# Patient Record
Sex: Female | Born: 1940
Health system: Southern US, Community
[De-identification: ages and names within clinical notes are randomized; demographics above are authoritative.]

## PROBLEM LIST (undated history)

## (undated) DIAGNOSIS — I10 Essential (primary) hypertension: Secondary | ICD-10-CM

## (undated) DIAGNOSIS — H269 Unspecified cataract: Secondary | ICD-10-CM

## (undated) DIAGNOSIS — K449 Diaphragmatic hernia without obstruction or gangrene: Secondary | ICD-10-CM

## (undated) DIAGNOSIS — M810 Age-related osteoporosis without current pathological fracture: Secondary | ICD-10-CM

## (undated) DIAGNOSIS — K589 Irritable bowel syndrome without diarrhea: Secondary | ICD-10-CM

## (undated) DIAGNOSIS — M1710 Unilateral primary osteoarthritis, unspecified knee: Secondary | ICD-10-CM

## (undated) DIAGNOSIS — K219 Gastro-esophageal reflux disease without esophagitis: Secondary | ICD-10-CM

## (undated) DIAGNOSIS — K8020 Calculus of gallbladder without cholecystitis without obstruction: Secondary | ICD-10-CM

## (undated) DIAGNOSIS — M5136 Other intervertebral disc degeneration, lumbar region: Secondary | ICD-10-CM

## (undated) DIAGNOSIS — E785 Hyperlipidemia, unspecified: Secondary | ICD-10-CM

## (undated) DIAGNOSIS — M179 Osteoarthritis of knee, unspecified: Secondary | ICD-10-CM

## (undated) DIAGNOSIS — K5730 Diverticulosis of large intestine without perforation or abscess without bleeding: Secondary | ICD-10-CM

## (undated) DIAGNOSIS — E669 Obesity, unspecified: Secondary | ICD-10-CM

## (undated) DIAGNOSIS — D126 Benign neoplasm of colon, unspecified: Secondary | ICD-10-CM

## (undated) DIAGNOSIS — M51369 Other intervertebral disc degeneration, lumbar region without mention of lumbar back pain or lower extremity pain: Secondary | ICD-10-CM

## (undated) DIAGNOSIS — R000 Tachycardia, unspecified: Secondary | ICD-10-CM

## (undated) DIAGNOSIS — K649 Unspecified hemorrhoids: Secondary | ICD-10-CM

## (undated) DIAGNOSIS — Z8619 Personal history of other infectious and parasitic diseases: Secondary | ICD-10-CM

## (undated) DIAGNOSIS — G43909 Migraine, unspecified, not intractable, without status migrainosus: Secondary | ICD-10-CM

## (undated) HISTORY — DX: Migraine, unspecified, not intractable, without status migrainosus: G43.909

## (undated) HISTORY — DX: Benign neoplasm of colon, unspecified: D12.6

## (undated) HISTORY — DX: Diverticulosis of large intestine without perforation or abscess without bleeding: K57.30

## (undated) HISTORY — DX: Diaphragmatic hernia without obstruction or gangrene: K44.9

## (undated) HISTORY — DX: Calculus of gallbladder without cholecystitis without obstruction: K80.20

## (undated) HISTORY — PX: CATARACT EXTRACTION: SUR2

## (undated) HISTORY — DX: Osteoarthritis of knee, unspecified: M17.9

## (undated) HISTORY — DX: Personal history of other infectious and parasitic diseases: Z86.19

## (undated) HISTORY — DX: Unspecified cataract: H26.9

## (undated) HISTORY — DX: Unspecified hemorrhoids: K64.9

## (undated) HISTORY — DX: Hyperlipidemia, unspecified: E78.5

## (undated) HISTORY — PX: COLONOSCOPY: SHX174

## (undated) HISTORY — DX: Obesity, unspecified: E66.9

## (undated) HISTORY — DX: Essential (primary) hypertension: I10

## (undated) HISTORY — PX: POLYPECTOMY: SHX149

## (undated) HISTORY — DX: Tachycardia, unspecified: R00.0

## (undated) HISTORY — DX: Age-related osteoporosis without current pathological fracture: M81.0

## (undated) HISTORY — DX: Unilateral primary osteoarthritis, unspecified knee: M17.10

## (undated) HISTORY — DX: Irritable bowel syndrome, unspecified: K58.9

## (undated) HISTORY — DX: Other intervertebral disc degeneration, lumbar region: M51.36

## (undated) HISTORY — DX: Other intervertebral disc degeneration, lumbar region without mention of lumbar back pain or lower extremity pain: M51.369

## (undated) HISTORY — DX: Gastro-esophageal reflux disease without esophagitis: K21.9

---

## 1977-07-28 HISTORY — PX: APPENDECTOMY: SHX54

## 1987-07-29 HISTORY — PX: CHOLECYSTECTOMY: SHX55

## 1994-07-28 HISTORY — PX: VAGINAL HYSTERECTOMY: SUR661

## 1999-08-01 ENCOUNTER — Other Ambulatory Visit: Admission: RE | Admit: 1999-08-01 | Discharge: 1999-08-01 | Payer: Self-pay | Admitting: Gynecology

## 2000-03-20 ENCOUNTER — Encounter: Admission: RE | Admit: 2000-03-20 | Discharge: 2000-03-20 | Payer: Self-pay | Admitting: Obstetrics and Gynecology

## 2000-03-20 ENCOUNTER — Encounter: Payer: Self-pay | Admitting: Obstetrics and Gynecology

## 2000-12-17 ENCOUNTER — Other Ambulatory Visit: Admission: RE | Admit: 2000-12-17 | Discharge: 2000-12-17 | Payer: Self-pay | Admitting: Obstetrics and Gynecology

## 2001-03-22 ENCOUNTER — Encounter: Payer: Self-pay | Admitting: Obstetrics and Gynecology

## 2001-03-22 ENCOUNTER — Encounter: Admission: RE | Admit: 2001-03-22 | Discharge: 2001-03-22 | Payer: Self-pay | Admitting: Obstetrics and Gynecology

## 2002-04-26 ENCOUNTER — Encounter: Payer: Self-pay | Admitting: Obstetrics and Gynecology

## 2002-04-26 ENCOUNTER — Encounter: Admission: RE | Admit: 2002-04-26 | Discharge: 2002-04-26 | Payer: Self-pay | Admitting: Obstetrics and Gynecology

## 2002-08-17 ENCOUNTER — Other Ambulatory Visit: Admission: RE | Admit: 2002-08-17 | Discharge: 2002-08-17 | Payer: Self-pay | Admitting: Obstetrics and Gynecology

## 2003-08-02 ENCOUNTER — Encounter: Admission: RE | Admit: 2003-08-02 | Discharge: 2003-08-02 | Payer: Self-pay | Admitting: Obstetrics and Gynecology

## 2003-09-21 ENCOUNTER — Other Ambulatory Visit: Admission: RE | Admit: 2003-09-21 | Discharge: 2003-09-21 | Payer: Self-pay | Admitting: Obstetrics and Gynecology

## 2004-09-12 ENCOUNTER — Ambulatory Visit: Payer: Self-pay | Admitting: Internal Medicine

## 2004-09-16 ENCOUNTER — Ambulatory Visit: Payer: Self-pay | Admitting: Family Medicine

## 2004-11-18 ENCOUNTER — Ambulatory Visit (HOSPITAL_COMMUNITY): Admission: RE | Admit: 2004-11-18 | Discharge: 2004-11-18 | Payer: Self-pay | Admitting: Internal Medicine

## 2004-11-27 ENCOUNTER — Ambulatory Visit: Payer: Self-pay | Admitting: Internal Medicine

## 2004-12-11 ENCOUNTER — Ambulatory Visit: Payer: Self-pay | Admitting: Internal Medicine

## 2005-01-09 ENCOUNTER — Other Ambulatory Visit: Admission: RE | Admit: 2005-01-09 | Discharge: 2005-01-09 | Payer: Self-pay | Admitting: Obstetrics and Gynecology

## 2005-01-29 ENCOUNTER — Ambulatory Visit: Payer: Self-pay | Admitting: Internal Medicine

## 2005-05-15 ENCOUNTER — Ambulatory Visit: Payer: Self-pay | Admitting: Internal Medicine

## 2005-08-07 ENCOUNTER — Ambulatory Visit: Payer: Self-pay | Admitting: Internal Medicine

## 2005-09-29 ENCOUNTER — Ambulatory Visit: Payer: Self-pay | Admitting: Pulmonary Disease

## 2005-11-11 ENCOUNTER — Ambulatory Visit: Payer: Self-pay | Admitting: Gastroenterology

## 2005-11-12 ENCOUNTER — Ambulatory Visit: Payer: Self-pay | Admitting: Internal Medicine

## 2005-11-20 ENCOUNTER — Ambulatory Visit: Payer: Self-pay | Admitting: Gastroenterology

## 2005-12-25 ENCOUNTER — Ambulatory Visit (HOSPITAL_COMMUNITY): Admission: RE | Admit: 2005-12-25 | Discharge: 2005-12-25 | Payer: Self-pay | Admitting: Obstetrics and Gynecology

## 2006-01-09 ENCOUNTER — Encounter: Admission: RE | Admit: 2006-01-09 | Discharge: 2006-01-09 | Payer: Self-pay | Admitting: Obstetrics and Gynecology

## 2006-02-11 ENCOUNTER — Ambulatory Visit: Payer: Self-pay | Admitting: Internal Medicine

## 2006-05-12 ENCOUNTER — Ambulatory Visit: Payer: Self-pay | Admitting: Internal Medicine

## 2006-06-08 ENCOUNTER — Ambulatory Visit: Payer: Self-pay | Admitting: Pulmonary Disease

## 2006-08-10 ENCOUNTER — Ambulatory Visit: Payer: Self-pay | Admitting: Internal Medicine

## 2006-08-10 LAB — CONVERTED CEMR LAB
AST: 19 units/L (ref 0–37)
Bilirubin Urine: NEGATIVE
Chloride: 104 meq/L (ref 96–112)
Eosinophil percent: 1.5 % (ref 0.0–5.0)
Glomerular Filtration Rate, Af Am: 92 mL/min/{1.73_m2}
Glucose, Bld: 107 mg/dL — ABNORMAL HIGH (ref 70–99)
HCT: 45.9 % (ref 36.0–46.0)
Ketones, ur: NEGATIVE mg/dL
Lymphocytes Relative: 29.9 % (ref 12.0–46.0)
Monocytes Relative: 7.2 % (ref 3.0–11.0)
Nitrite: NEGATIVE
Platelets: 280 10*3/uL (ref 150–400)
RBC: 5.24 M/uL — ABNORMAL HIGH (ref 3.87–5.11)
RDW: 12.4 % (ref 11.5–14.6)
Sodium: 143 meq/L (ref 135–145)
Total Protein, Urine: NEGATIVE mg/dL
Urine Glucose: NEGATIVE mg/dL
VLDL: 47 mg/dL — ABNORMAL HIGH (ref 0–40)
WBC: 9.2 10*3/uL (ref 4.5–10.5)
pH: 5.5 (ref 5.0–8.0)

## 2006-08-17 ENCOUNTER — Ambulatory Visit: Payer: Self-pay | Admitting: Critical Care Medicine

## 2006-11-09 ENCOUNTER — Ambulatory Visit: Payer: Self-pay | Admitting: Internal Medicine

## 2006-11-09 LAB — CONVERTED CEMR LAB
ALT: 20 units/L (ref 0–40)
AST: 21 units/L (ref 0–37)
Albumin: 3.8 g/dL (ref 3.5–5.2)
Alkaline Phosphatase: 54 units/L (ref 39–117)
Bilirubin, Direct: 0.2 mg/dL (ref 0.0–0.3)
CO2: 32 meq/L (ref 19–32)
Calcium: 9.5 mg/dL (ref 8.4–10.5)
Chloride: 107 meq/L (ref 96–112)
Cholesterol: 158 mg/dL (ref 0–200)
Eosinophils Absolute: 0.1 10*3/uL (ref 0.0–0.6)
Eosinophils Relative: 2.3 % (ref 0.0–5.0)
GFR calc Af Amer: 128 mL/min
HCT: 37.6 % (ref 36.0–46.0)
HDL: 43.7 mg/dL (ref >39.0)
MCHC: 34.8 g/dL (ref 30.0–36.0)
MCV: 84.2 fL (ref 78.0–100.0)
Monocytes Absolute: 0.4 10*3/uL (ref 0.2–0.7)
Neutro Abs: 3.7 10*3/uL (ref 1.4–7.7)
Neutrophils Relative %: 58.7 % (ref 43.0–77.0)
Potassium: 4.2 meq/L (ref 3.5–5.1)
Total Protein: 6.8 g/dL (ref 6.0–8.3)
VLDL: 31 mg/dL (ref 0–40)
WBC: 6.1 10*3/uL (ref 4.5–10.5)

## 2006-11-16 ENCOUNTER — Ambulatory Visit: Payer: Self-pay | Admitting: Family Medicine

## 2006-12-02 ENCOUNTER — Ambulatory Visit: Payer: Self-pay | Admitting: Gastroenterology

## 2007-02-15 ENCOUNTER — Ambulatory Visit: Payer: Self-pay | Admitting: Internal Medicine

## 2007-02-15 LAB — CONVERTED CEMR LAB
Albumin: 4.2 g/dL (ref 3.5–5.2)
Cholesterol: 179 mg/dL (ref 0–200)
HDL: 48.3 mg/dL (ref >39.0)
LDL Cholesterol: 100 mg/dL — ABNORMAL HIGH (ref 0–99)
Total CHOL/HDL Ratio: 3.7
Triglycerides: 154 mg/dL — ABNORMAL HIGH (ref 0–149)
Vit D, 1,25-Dihydroxy: 30 (ref 20–57)

## 2007-03-02 ENCOUNTER — Ambulatory Visit (HOSPITAL_COMMUNITY): Admission: RE | Admit: 2007-03-02 | Discharge: 2007-03-02 | Payer: Self-pay | Admitting: Obstetrics and Gynecology

## 2007-05-25 ENCOUNTER — Ambulatory Visit: Payer: Self-pay | Admitting: Internal Medicine

## 2007-08-16 DIAGNOSIS — K5730 Diverticulosis of large intestine without perforation or abscess without bleeding: Secondary | ICD-10-CM | POA: Insufficient documentation

## 2007-08-16 DIAGNOSIS — K589 Irritable bowel syndrome without diarrhea: Secondary | ICD-10-CM | POA: Insufficient documentation

## 2007-08-16 DIAGNOSIS — E785 Hyperlipidemia, unspecified: Secondary | ICD-10-CM | POA: Insufficient documentation

## 2007-08-16 DIAGNOSIS — K649 Unspecified hemorrhoids: Secondary | ICD-10-CM

## 2007-08-16 DIAGNOSIS — M810 Age-related osteoporosis without current pathological fracture: Secondary | ICD-10-CM | POA: Insufficient documentation

## 2007-08-16 DIAGNOSIS — I10 Essential (primary) hypertension: Secondary | ICD-10-CM | POA: Insufficient documentation

## 2007-08-16 DIAGNOSIS — K449 Diaphragmatic hernia without obstruction or gangrene: Secondary | ICD-10-CM | POA: Insufficient documentation

## 2007-08-17 ENCOUNTER — Ambulatory Visit: Payer: Self-pay | Admitting: Internal Medicine

## 2007-08-17 DIAGNOSIS — I119 Hypertensive heart disease without heart failure: Secondary | ICD-10-CM | POA: Insufficient documentation

## 2007-08-17 DIAGNOSIS — T887XXA Unspecified adverse effect of drug or medicament, initial encounter: Secondary | ICD-10-CM

## 2007-08-17 DIAGNOSIS — K219 Gastro-esophageal reflux disease without esophagitis: Secondary | ICD-10-CM

## 2007-08-17 LAB — CONVERTED CEMR LAB
ALT: 19 units/L (ref 0–35)
Albumin: 4 g/dL (ref 3.5–5.2)
Alkaline Phosphatase: 52 units/L (ref 39–117)
Basophils Absolute: 0 10*3/uL (ref 0.0–0.1)
Basophils Relative: 0.1 % (ref 0.0–1.0)
CO2: 31 meq/L (ref 19–32)
Chloride: 101 meq/L (ref 96–112)
Cholesterol: 274 mg/dL (ref 0–200)
Creatinine, Ser: 0.7 mg/dL (ref 0.4–1.2)
Eosinophils Absolute: 0.2 10*3/uL (ref 0.0–0.6)
GFR calc non Af Amer: 88 mL/min
Glucose, Bld: 106 mg/dL — ABNORMAL HIGH (ref 70–99)
HDL: 43.8 mg/dL (ref >39.0)
Monocytes Absolute: 0.4 10*3/uL (ref 0.2–0.7)
Nitrite: NEGATIVE
Potassium: 3.9 meq/L (ref 3.5–5.1)
RBC: 4.86 M/uL (ref 3.87–5.11)
Sodium: 141 meq/L (ref 135–145)
Specific Gravity, Urine: 1.025 (ref 1.000–1.03)
TSH: 4.06 microintl units/mL (ref 0.35–5.50)
Total Bilirubin: 1.4 mg/dL — ABNORMAL HIGH (ref 0.3–1.2)
pH: 6 (ref 5.0–8.0)

## 2007-08-19 LAB — CONVERTED CEMR LAB: Vit D, 1,25-Dihydroxy: 28 — ABNORMAL LOW (ref 30–89)

## 2007-12-15 ENCOUNTER — Ambulatory Visit: Payer: Self-pay | Admitting: Internal Medicine

## 2007-12-15 LAB — CONVERTED CEMR LAB
CRP, High Sensitivity: <1 — ABNORMAL LOW (ref 0.00–5.00)
Total CHOL/HDL Ratio: 7
Triglycerides: 204 mg/dL (ref 0–149)
VLDL: 41 mg/dL — ABNORMAL HIGH (ref 0–40)

## 2008-03-15 ENCOUNTER — Ambulatory Visit: Payer: Self-pay | Admitting: Internal Medicine

## 2008-03-15 DIAGNOSIS — B372 Candidiasis of skin and nail: Secondary | ICD-10-CM | POA: Insufficient documentation

## 2008-03-17 LAB — CONVERTED CEMR LAB
HDL: 45.4 mg/dL (ref >39.0)
Total CHOL/HDL Ratio: 4.1
Total Protein: 7.5 g/dL (ref 6.0–8.3)

## 2008-03-23 ENCOUNTER — Ambulatory Visit (HOSPITAL_COMMUNITY): Admission: RE | Admit: 2008-03-23 | Discharge: 2008-03-23 | Payer: Self-pay | Admitting: Obstetrics and Gynecology

## 2008-06-19 ENCOUNTER — Ambulatory Visit: Payer: Self-pay | Admitting: Internal Medicine

## 2008-06-19 DIAGNOSIS — S139XXA Sprain of joints and ligaments of unspecified parts of neck, initial encounter: Secondary | ICD-10-CM | POA: Insufficient documentation

## 2008-09-27 ENCOUNTER — Ambulatory Visit: Payer: Self-pay | Admitting: Internal Medicine

## 2008-09-27 DIAGNOSIS — R079 Chest pain, unspecified: Secondary | ICD-10-CM

## 2008-09-27 LAB — CONVERTED CEMR LAB
ALT: 18 units/L (ref 0–35)
AST: 21 units/L (ref 0–37)
Alkaline Phosphatase: 50 units/L (ref 39–117)
Chloride: 102 meq/L (ref 96–112)
Creatinine, Ser: 0.7 mg/dL (ref 0.4–1.2)
Eosinophils Absolute: 0.2 10*3/uL (ref 0.0–0.7)
GFR calc Af Amer: 107 mL/min
GFR calc non Af Amer: 88 mL/min
Glucose, Bld: 106 mg/dL — ABNORMAL HIGH (ref 70–99)
HDL: 46.1 mg/dL (ref >39.0)
LDL Cholesterol: 106 mg/dL — ABNORMAL HIGH (ref 0–99)
MCHC: 34.7 g/dL (ref 30.0–36.0)
MCV: 85.7 fL (ref 78.0–100.0)
Monocytes Relative: 7.1 % (ref 3.0–12.0)
Neutrophils Relative %: 58.4 % (ref 43.0–77.0)
Total Protein: 7.5 g/dL (ref 6.0–8.3)
WBC: 7.4 10*3/uL (ref 4.5–10.5)

## 2008-11-22 ENCOUNTER — Telehealth (INDEPENDENT_AMBULATORY_CARE_PROVIDER_SITE_OTHER): Payer: Self-pay | Admitting: *Deleted

## 2009-02-01 ENCOUNTER — Encounter: Payer: Self-pay | Admitting: Internal Medicine

## 2009-02-01 ENCOUNTER — Ambulatory Visit: Payer: Self-pay | Admitting: Family Medicine

## 2009-02-06 ENCOUNTER — Ambulatory Visit: Payer: Self-pay | Admitting: Internal Medicine

## 2009-02-06 DIAGNOSIS — M2550 Pain in unspecified joint: Secondary | ICD-10-CM | POA: Insufficient documentation

## 2009-02-06 LAB — CONVERTED CEMR LAB
BUN: 9 mg/dL (ref 6–23)
CO2: 32 meq/L (ref 19–32)
Calcium: 9.5 mg/dL (ref 8.4–10.5)
Chloride: 106 meq/L (ref 96–112)
Cholesterol: 182 mg/dL (ref 0–200)
Creatinine, Ser: 0.7 mg/dL (ref 0.4–1.2)
Glucose, Bld: 109 mg/dL — ABNORMAL HIGH (ref 70–99)
Sodium: 142 meq/L (ref 135–145)
VLDL: 34 mg/dL (ref 0.0–40.0)

## 2009-02-21 ENCOUNTER — Telehealth (INDEPENDENT_AMBULATORY_CARE_PROVIDER_SITE_OTHER): Payer: Self-pay | Admitting: *Deleted

## 2009-03-27 ENCOUNTER — Ambulatory Visit (HOSPITAL_COMMUNITY): Admission: RE | Admit: 2009-03-27 | Discharge: 2009-03-27 | Payer: Self-pay | Admitting: Obstetrics and Gynecology

## 2009-03-30 ENCOUNTER — Ambulatory Visit: Payer: Self-pay | Admitting: Cardiovascular Disease

## 2009-03-30 ENCOUNTER — Emergency Department (HOSPITAL_COMMUNITY): Admission: EM | Admit: 2009-03-30 | Discharge: 2009-03-30 | Payer: Self-pay | Admitting: Emergency Medicine

## 2009-04-09 ENCOUNTER — Ambulatory Visit: Payer: Self-pay | Admitting: Cardiovascular Disease

## 2009-04-09 ENCOUNTER — Ambulatory Visit: Payer: Self-pay

## 2009-04-09 ENCOUNTER — Encounter: Payer: Self-pay | Admitting: Cardiovascular Disease

## 2009-05-14 DIAGNOSIS — E669 Obesity, unspecified: Secondary | ICD-10-CM

## 2009-05-14 DIAGNOSIS — R002 Palpitations: Secondary | ICD-10-CM | POA: Insufficient documentation

## 2009-05-14 DIAGNOSIS — Z86010 Personal history of colonic polyps: Secondary | ICD-10-CM

## 2009-05-14 DIAGNOSIS — M722 Plantar fascial fibromatosis: Secondary | ICD-10-CM

## 2009-05-15 ENCOUNTER — Ambulatory Visit: Payer: Self-pay | Admitting: Cardiovascular Disease

## 2009-05-21 ENCOUNTER — Ambulatory Visit: Payer: Self-pay | Admitting: Internal Medicine

## 2009-05-21 LAB — CONVERTED CEMR LAB
AST: 20 units/L (ref 0–37)
Albumin: 4.1 g/dL (ref 3.5–5.2)
Cholesterol: 194 mg/dL (ref 0–200)
Total Bilirubin: 1.8 mg/dL — ABNORMAL HIGH (ref 0.3–1.2)
Triglycerides: 168 mg/dL — ABNORMAL HIGH (ref 0.0–149.0)

## 2009-10-02 ENCOUNTER — Ambulatory Visit: Payer: Self-pay | Admitting: Internal Medicine

## 2009-10-02 LAB — CONVERTED CEMR LAB
ALT: 18 units/L (ref 0–35)
Albumin: 4.3 g/dL (ref 3.5–5.2)
Alkaline Phosphatase: 47 units/L (ref 39–117)
Basophils Relative: 0.6 % (ref 0.0–3.0)
Bilirubin Urine: NEGATIVE
CRP, High Sensitivity: 0.9 (ref 0.00–5.00)
Calcium: 9.8 mg/dL (ref 8.4–10.5)
Cholesterol: 199 mg/dL (ref 0–200)
Creatinine, Ser: 0.7 mg/dL (ref 0.4–1.2)
Eosinophils Absolute: 0.2 10*3/uL (ref 0.0–0.7)
GFR calc non Af Amer: 87.8 mL/min (ref >60)
HDL: 57.2 mg/dL (ref >39.00)
Hemoglobin: 13.8 g/dL (ref 12.0–15.0)
LDL Cholesterol: 106 mg/dL — ABNORMAL HIGH (ref 0–99)
MCHC: 33.8 g/dL (ref 30.0–36.0)
Nitrite: NEGATIVE
Potassium: 4.2 meq/L (ref 3.5–5.1)
RBC: 4.58 M/uL (ref 3.87–5.11)
Specific Gravity, Urine: 1.025 (ref 1.000–1.030)
Total CHOL/HDL Ratio: 3
Triglycerides: 177 mg/dL — ABNORMAL HIGH (ref 0.0–149.0)
Urobilinogen, UA: 0.2 (ref 0.0–1.0)
VLDL: 35.4 mg/dL (ref 0.0–40.0)

## 2009-10-03 LAB — CONVERTED CEMR LAB: Vit D, 25-Hydroxy: 36 ng/mL (ref 30–89)

## 2009-11-07 ENCOUNTER — Telehealth (INDEPENDENT_AMBULATORY_CARE_PROVIDER_SITE_OTHER): Payer: Self-pay | Admitting: *Deleted

## 2010-01-30 ENCOUNTER — Ambulatory Visit: Payer: Self-pay | Admitting: Internal Medicine

## 2010-02-06 LAB — CONVERTED CEMR LAB
ALT: 17 units/L (ref 0–35)
AST: 20 units/L (ref 0–37)
Bilirubin, Direct: 0.2 mg/dL (ref 0.0–0.3)
Cholesterol: 206 mg/dL — ABNORMAL HIGH (ref 0–200)
Direct LDL: 118.9 mg/dL
Total Bilirubin: 1.6 mg/dL — ABNORMAL HIGH (ref 0.3–1.2)
Total CHOL/HDL Ratio: 4
Triglycerides: 264 mg/dL — ABNORMAL HIGH (ref 0.0–149.0)

## 2010-02-19 ENCOUNTER — Encounter: Payer: Self-pay | Admitting: Adult Health

## 2010-02-19 ENCOUNTER — Ambulatory Visit: Payer: Self-pay | Admitting: Internal Medicine

## 2010-02-19 DIAGNOSIS — R5383 Other fatigue: Secondary | ICD-10-CM

## 2010-02-19 DIAGNOSIS — R5381 Other malaise: Secondary | ICD-10-CM

## 2010-02-20 LAB — CONVERTED CEMR LAB
Bilirubin Urine: NEGATIVE
Chloride: 101 meq/L (ref 96–112)
GFR calc non Af Amer: 82.25 mL/min (ref >60)
Leukocytes, UA: NEGATIVE
Potassium: 5 meq/L (ref 3.5–5.1)
Sodium: 141 meq/L (ref 135–145)
Specific Gravity, Urine: 1.015 (ref 1.000–1.030)
Total Protein, Urine: NEGATIVE mg/dL
Urobilinogen, UA: 0.2 (ref 0.0–1.0)

## 2010-04-05 ENCOUNTER — Ambulatory Visit (HOSPITAL_COMMUNITY): Admission: RE | Admit: 2010-04-05 | Discharge: 2010-04-05 | Payer: Self-pay | Admitting: Obstetrics and Gynecology

## 2010-05-30 ENCOUNTER — Telehealth (INDEPENDENT_AMBULATORY_CARE_PROVIDER_SITE_OTHER): Payer: Self-pay | Admitting: *Deleted

## 2010-05-31 ENCOUNTER — Ambulatory Visit: Payer: Self-pay | Admitting: Internal Medicine

## 2010-06-03 LAB — CONVERTED CEMR LAB
CO2: 32 meq/L (ref 19–32)
Eosinophils Absolute: 0.2 10*3/uL (ref 0.0–0.7)
Glucose, Bld: 99 mg/dL (ref 70–99)
HCT: 40 % (ref 36.0–46.0)
Lymphocytes Relative: 35.4 % (ref 12.0–46.0)
Lymphs Abs: 2.6 10*3/uL (ref 0.7–4.0)
Neutro Abs: 3.9 10*3/uL (ref 1.4–7.7)
Platelets: 219 10*3/uL (ref 150.0–400.0)
Potassium: 4.3 meq/L (ref 3.5–5.1)
RBC: 4.58 M/uL (ref 3.87–5.11)
RDW: 13.1 % (ref 11.5–14.6)

## 2010-06-18 ENCOUNTER — Telehealth: Payer: Self-pay | Admitting: Internal Medicine

## 2010-08-18 ENCOUNTER — Encounter: Payer: Self-pay | Admitting: Obstetrics and Gynecology

## 2010-08-27 NOTE — Progress Notes (Signed)
Summary: BP QUESTION> no change rx indicated  Phone Note Call from Patient Call back at [facility] Phone [phone]   Caller: Patient Call For: [name] Summary of Call: PT WANTS TO DISCUSS HER BP AND CHANGING HER MEDS Initial call taken by: Lacinda Axon,  June 18, 2010 3:44 PM  Follow-up for Phone Call        Spoke with pt.  She states that she has been monitoring her BP and wanted to update MW- lowest reading was 100/60 and highest has been 143/74.  She states that she has had 2 "weak spells" since last seen and her bp was 111/67 and pulse 64 the first time, and the last time it was 130/70- and pulse 70.  She wants to know if we need to adjust her meds.  Pls advise thanks! Follow-up by: Vernie Murders,  June 18, 2010 3:56 PM  Additional Follow-up for Phone Call Additional follow up Details #1::        no indication for changes, can discuss specifics at next ov or send to cards for a second opinion re the relationship between her bp and her spells, whichever she prefers is fine with me Additional Follow-up by: Nyoka Cowden MD,  June 18, 2010 5:53 PM    Additional Follow-up for Phone Call Additional follow up Details #2::    pt prefers to wait until next ov with MW. Carron Curie CMA  June 19, 2010 8:58 AM

## 2010-08-27 NOTE — Progress Notes (Signed)
Summary: rx request  Phone Note Call from Patient Call back at Home Phone [phone]   Caller: Patient Call For: [name] [name] Summary of Call: pt needs new rx for BISOPROLOL [facility]- [facility].  Initial call taken by: Tivis Ringer, CNA,  November 07, 2009 3:50 PM  Follow-up for Phone Call        Spoke with pt and made aware rx was sent to pharm. Follow-up by: Vernie Murders,  November 07, 2009 4:13 PM    Prescriptions: [facility] 5-6.25 MG  TABS (BISOPROLOL-HYDROCHLOROTHIAZIDE) once daily  #90 x 3   Entered by:   Vernie Murders   Authorized by:   Nyoka Cowden MD   Signed by:   Vernie Murders on 11/07/2009   Method used:   Electronically to        [facility] (mail-order)       [address]       [place], [place]  [address]       Ph: [phone]       Fax: [phone]   RxID:   [id number]

## 2010-08-27 NOTE — Assessment & Plan Note (Signed)
Summary: Primary service/ acute  ov for f/u hbp and eval spells   Primary Provider/Referring Provider:  wert  CC:  "Weak spells".  History of Present Illness:  24 yowf never smoker with long-standing hyperlipidemia hypertension and a positive family history of heart disease so ldl target < 130  September 27, 2008 concerned ZO:XWRU she reads in the paper about crestor .  Having occ pressure in chest has had in past but more freq x sev months not necessarily related to activity, maybe better lying down. Lasts sev minutes, epigastric location, no radiation no nausea or diaphoresis or palpitiations or presyncope. No def worsening of chronic positional neck pain on crestor, nor better off it. resolved on maalox.  February 06, 2009 ov for f/u hypelipidemia.  no change in rx  Sept 3 2010 to er for rapid heart rate, eval by Eden Emms, no cp or evidence ihd  May 21, 2009 3 month followup of hbp and cholesterol on rx. no change in rx  October 02, 2009 cpx no tia claudication. no cough or sob.  rec change to simvostatin and consider reclast  January 30, 2010 3 month followup. no cp, sob, tia, leg swelling or aching muscles or sob.   February 19, 2010--Presents for a work in visit. Compalins of sporadic "weak spells" accompanied with nausea x5 episodes within 1 week..   no change in rx  May 31, 2010 ov c/o spells= weak all over  sev x per week lasting a few minutes for up to an hour, sometimes when bends over at waist.  checks her own pulse and appears normal during the spells.  no problem with activity or presyncope, just weak all over.  no amaurosis or viz or localized neuro changes or vertigo. Pt denies any significant sore throat, dysphagia, itching, sneezing,  nasal congestion or excess secretions,  fever, chills, sweats, unintended wt loss, pleuritic or exertional cp, hempoptysis, change in activity tolerance  orthopnea pnd or leg swelling.  Current Medications (verified): 1)  Prilosec Otc 20 Mg  Tbec  (Omeprazole Magnesium) .[name].. 1 Tab By Mouth in The Morning Before Meal 2)  Ecotrin Low Strength 81 Mg  Tbec (Aspirin) .... Once Daily 3)  Centrum Silver  Tabs (Multiple Vitamins-Minerals) .[name].. 1 Once Daily 4)  Ziac 5-6.25 Mg  Tabs (Bisoprolol-Hydrochlorothiazide) .... Once Daily 5)  Citrucel   Powd (Methylcellulose (Laxative)) .... Mix With Large Glass of Juice Once Daily 6)  Simvastatin 20 Mg  Tabs (Simvastatin) .... One Tablet Daily 7)  Oscal 500/200 D-3 500-200 Mg-Unit  Tabs (Calcium-Vitamin D) .... Two Times A Day 8)  Robitussin Dm 100-10 Mg/78ml  Syrp (Dextromethorphan-Guaifenesin) .... As Directed 9)  Tylenol 325 Mg  Tabs (Acetaminophen) .[name].. 1-2 Every 4-6 Hours As Needed 10)  Advil 200 Mg  Caps (Ibuprofen) .[name].. 1-3 With Meals (Three Times A Day) 11)  Claritin 10 Mg  Tabs (Loratadine) .... Once Daily As Needed 12)  Tramadol Hcl 50 Mg  Tabs (Tramadol Hcl) .... Every 4 Hours As Needed  Allergies (verified): No Known Drug Allergies  Past History:  Past Medical History: HYPERTENSION (ICD-401.9) BEN HTN HEART DISEASE WITHOUT HEART FAIL (ICD-402.10) CORONARY HEART DISEASE (ICD-V17.3) PLANTAR FASCIITIS (ICD-728.71) COLONIC POLYPS (ICD-211.3)Colon polyps and Diverticulosis........Marland KitchenRussella Dar      -colonoscopy 11/20/2005 for recall 10/2010 OBESITY (ICD-278.00) PALPITATIONS (ICD-785.1)..................................................................Marland KitchenNishan ARTHRALGIA UNSPECIFIED SITE (ICD-719.40) CHEST PAIN (ICD-786.50) CERVICAL MUSCLE STRAIN (ICD-847.0) CANDIDIASIS, SKIN (ICD-112.3) G E R D (ICD-530.81) UNS ADVRS EFF UNS RX MEDICINAL&BIOLOGICAL SBSTNC (ICD-995.20) DIVERTICULAR DISEASE (  ICD-562.10) HIATAL HERNIA (ICD-553.3) UNSPECIFIED HEMORRHOIDS WITH OTHER COMPLICATION (ICD-455.8) IBS (ICD-564.1) Hyperlipidemia (ICD-272.4) target LDL less than 130 based on positive family history and hypertension Back  Pain..............................................................................................Marland KitchenRamos OSTEOPOROSIS (ICD-733.00)      - CT bone densitometry November 16 2006 : PA Spine -2.6  left fem neck -2.9,   right fem neck -2.3                                        RE-DEXA             02/01/09    -2.5                  -2.8                             -2.5      -  intolerant of biphosphonates G E R D    - see EGD 11/19/06  no gross abn's HEALTH MAINTENANCE.............................................................Marland KitchenWert     - dt  2/06     - Pneumovax 2/06     - CPX October 02, 2009     - GYN [place]  Vital Signs:  Patient profile:   70 year old female Weight:      155.13 pounds O2 Sat:      95 % on Room air Temp:     98.1 degrees F oral Pulse rate:   61 / minute BP sitting:   130 / 80  (left arm)  Vitals Entered By: Vernie Murders (May 31, 2010 11:44 AM)  O2 Flow:  Room air  Physical Exam  Additional Exam:  GENERAL:  A/Ox3; pleasant & cooperative.NAD  No orthostasis noted  WT   162 September 27, 2008 > 161 February 06, 2009 > 158 May 21, 2009 > 157 October 02, 2009 > 155 January 30, 2010 >>155 May 31, 2010  HEENT:  [place]/AT, EOM-wnl, PERRLA, EACs-clear, TMs-wnl, NOSE-clear, THROAT-clear & wnl. NECK:  Supple w/ fair ROM; no JVD; normal carotid impulses w/o bruits; no thyromegaly or nodules palpated; no lymphadenopathy. CHEST:  Clear to P & A; w/o, wheezes/ rales/ or rhonchi. HEART:  RRR, no m/r/g  heard ABDOMEN:  Soft & nt; nml bowel sounds; no organomegaly or masses detected, no guarding or rebound, neg CVA tenderness EXT: Warm bilat,  no calf pain, edema, clubbing, pulses intact    Sodium                    142 mEq/L                   135-145   Potassium                 4.3 mEq/L                   3.5-5.1   Chloride                  103 mEq/L                   96-112   Carbon Dioxide            32 mEq/L                    19-32   Glucose  99 mg/dL                     16-10   BUN                       15 mg/dL                    9-60   Creatinine                0.8 mg/dL                   4.5-4.0   Calcium                   9.7 mg/dL                   9.8-11.9   GFR                       77.34 mL/min                >60  Tests: (2) CBC Platelet w/Diff (CBCD)   White Cell Count          7.2 K/uL                    4.5-10.5   Red Cell Count            4.58 Mil/uL                 3.87-5.11   Hemoglobin                14.0 g/dL                   14.7-82.9   Hematocrit                40.0 %                      36.0-46.0   MCV                       87.4 fl                     78.0-100.0   MCHC                      35.0 g/dL                   56.2-13.0   RDW                       13.1 %                      11.5-14.6   Platelet Count            219.0 K/uL                  150.0-400.0   Neutrophil %              54.5 %                      43.0-77.0   Lymphocyte %              35.4 %  12.0-46.0   Monocyte %                7.6 %                       3.0-12.0   Eosinophils%              2.1 %                       0.0-5.0   Basophils %               0.4 %                       0.0-3.0   Neutrophill Absolute      3.9 K/uL                    1.4-7.7   Lymphocyte Absolute       2.6 K/uL                    0.7-4.0   Monocyte Absolute         0.5 K/uL                    0.1-1.0  Eosinophils, Absolute                             0.2 K/uL                    0.0-0.7   Basophils Absolute        0.0 K/uL                    0.0-0.1  Impression & Recommendations:  Problem # 1:  HYPERTENSION (ICD-401.9)  Her updated medication list for this problem includes:    Ziac 5-6.25 Mg Tabs (Bisoprolol-hydrochlorothiazide) ..... Once daily  No evidence of orthostasis or adverse drug effect here though I'm concerned about it - See instructions for specific recommendations   Orders: Est. Patient Level III ([address])  Problem # 2:  WEAKNESS  (ICD-780.79) Labs ok, no change in recs  Medications Added to Medication List This Visit: 1)  Centrum Silver Tabs (Multiple vitamins-minerals) .[name].. 1 once daily  Other Orders: Influenza Vaccine MCR (60454) TLB-BMP (Basic Metabolic Panel-BMET) (80048-METABOL) TLB-CBC Platelet - w/Differential (85025-CBCD)  Patient Instructions: 1)  See calendar for specific medication instructions and bring it back for each and every office visit for every healthcare provider you see.  Without it,  you may not receive the best quality medical care that we feel you deserve.  2)  Check your blood pressure and pulse rate to see if they if they correlate with your spells 3)  October 03 2010 cpx due, return in meantime  if needed for Holter monitor                  Immunizations Administered:  Influenza Vaccine # 1:    Vaccine Type: Fluvax MCR    Site: right deltoid    Mfr: novartis    Dose: 0.5 ml    Given by: Vernie Murders    Exp. Date: 12/27/2010    Lot #: [id number]    VIS given: 02/18/07 version given May 31, 2010.  Flu Vaccine Consent Questions:    Do you have a history of severe allergic reactions  to this vaccine? no    Any prior history of allergic reactions to egg and/or gelatin? no    Do you have a sensitivity to the preservative Thimersol? no    Do you have a past history of Guillan-Barre Syndrome? no    Do you currently have an acute febrile illness? no    Have you ever had a severe reaction to latex? no    Vaccine information given and explained to patient? yes    Are you currently pregnant? no

## 2010-08-27 NOTE — Progress Notes (Signed)
Summary: fatigue and weakness- ov sched  Phone Note Call from Patient Call back at Home Phone [phone]   Caller: Patient Call For: wert Summary of Call: Pt states she saw her ob/gyn yesterday and her bp was running low 120/70, c/o feeling weak, pls advise.//[facility] Initial call taken by: Darletta Moll,  May 30, 2010 8:49 AM  Follow-up for Phone Call        Spoke with pt.  She is concerned b/c GYN told her that her BP was low at ov there yesterday- 120/70.  I advised that this is actually a good BP reading.  Pt states that she iss also concerned b/c she has been feeling weak and fatigued recently.  OV sched with MW for tommorrow at 11:55 am.  Advised that she bring all meds in hand.  Pt verbalized understanding.   Follow-up by: Vernie Murders,  May 30, 2010 9:36 AM

## 2010-08-27 NOTE — Assessment & Plan Note (Signed)
Summary: Acute NP office visit - weakness   Primary Provider/Referring Provider:  wert  CC:  sporadic "weak spells" accompanied with nausea x5 episodes within 1 week.  History of Present Illness:  58 yowf never smoker with long-standing hyperlipidemia hypertension and a positive family history of heart disease so ldl target < 130  September 27, 2008 concerned ZO:XWRU she reads in the paper about crestor .  Having occ pressure in chest has had in past but more freq x sev months not necessarily related to activity, maybe better lying down. Lasts sev minutes, epigastric location, no radiation no nausea or diaphoresis or palpitiations or presyncope. No def worsening of chronic positional neck pain on crestor, nor better off it. resolved on maalox.  February 06, 2009 ov for f/u hypelipidemia.  no change in rx  Sept 3 2010 to er for rapid heart rate, eval by Eden Emms, no cp or evidence ihd  May 21, 2009 3 month followup of hbp and cholesterol on rx. no change in rx  October 02, 2009 cpx no tia claudication. no cough or sob.  rec change to simvostatin and consider reclast  January 30, 2010 3 month followup. no cp, sob, tia, leg swelling or aching muscles or sob.   February 19, 2010--Presents for a work in visit. Compalins of sporadic "weak spells" accompanied with nausea x5 episodes within 1 week. Has had some intermittent episodes over last week. Has had in past but infrequent. More episodes over last week. Has assoicated nausea and decreased appetitie. Denies vomitting, chest pain, back pain, urinary symptoms, bloody stools, diarrhea, rash, new meds or recent travel. was seen couple weeks ago w/ labs that was essentially unremarkable.    Medications Prior to Update: 1)  Prilosec Otc 20 Mg  Tbec (Omeprazole Magnesium) .[name].. 1 Tab By Mouth in The Morning Before Meal 2)  Ecotrin Low Strength 81 Mg  Tbec (Aspirin) .... Once Daily 3)  Multivitamins   Tabs (Multiple Vitamin) .... Once Daily 4)  Ziac 5-6.25 Mg   Tabs (Bisoprolol-Hydrochlorothiazide) .... Once Daily 5)  Citrucel   Powd (Methylcellulose (Laxative)) .... Mix With Large Glass of Juice Once Daily 6)  Simvastatin 20 Mg  Tabs (Simvastatin) .... One Tablet Daily 7)  Oscal 500/200 D-3 500-200 Mg-Unit  Tabs (Calcium-Vitamin D) .... Two Times A Day 8)  Robitussin Dm 100-10 Mg/29ml  Syrp (Dextromethorphan-Guaifenesin) .... As Directed 9)  Tylenol 325 Mg  Tabs (Acetaminophen) .[name].. 1-2 Every 4-6 Hours As Needed 10)  Advil 200 Mg  Caps (Ibuprofen) .[name].. 1-3 With Meals (Three Times A Day) 11)  Claritin 10 Mg  Tabs (Loratadine) .... Once Daily As Needed 12)  Tramadol Hcl 50 Mg  Tabs (Tramadol Hcl) .... Every 4 Hours As Needed  Current Medications (verified): 1)  Prilosec Otc 20 Mg  Tbec (Omeprazole Magnesium) .[name].. 1 Tab By Mouth in The Morning Before Meal 2)  Ecotrin Low Strength 81 Mg  Tbec (Aspirin) .... Once Daily 3)  Multivitamins   Tabs (Multiple Vitamin) .... Once Daily 4)  Ziac 5-6.25 Mg  Tabs (Bisoprolol-Hydrochlorothiazide) .... Once Daily 5)  Citrucel   Powd (Methylcellulose (Laxative)) .... Mix With Large Glass of Juice Once Daily 6)  Simvastatin 20 Mg  Tabs (Simvastatin) .... One Tablet Daily 7)  Oscal 500/200 D-3 500-200 Mg-Unit  Tabs (Calcium-Vitamin D) .... Two Times A Day 8)  Robitussin Dm 100-10 Mg/24ml  Syrp (Dextromethorphan-Guaifenesin) .... As Directed 9)  Tylenol 325 Mg  Tabs (Acetaminophen) .[name].. 1-2 Every  4-6 Hours As Needed 10)  Advil 200 Mg  Caps (Ibuprofen) .[name].. 1-3 With Meals (Three Times A Day) 11)  Claritin 10 Mg  Tabs (Loratadine) .... Once Daily As Needed 12)  Tramadol Hcl 50 Mg  Tabs (Tramadol Hcl) .... Every 4 Hours As Needed  Allergies (verified): No Known Drug Allergies  Past History:  Past Medical History: Last updated: 01/30/2010 HYPERTENSION (ICD-401.9) BEN HTN HEART DISEASE WITHOUT HEART FAIL (ICD-402.10) CORONARY HEART DISEASE (ICD-V17.3) PLANTAR FASCIITIS (ICD-728.71) COLONIC POLYPS  (ICD-211.3)Colon polyps and Diverticulosis........Marland KitchenRussella Dar      -colonoscopy 11/20/2005 for recall 10/2010 OBESITY (ICD-278.00) PALPITATIONS (ICD-785.1).................................................................Marland KitchenNishan ARTHRALGIA UNSPECIFIED SITE (ICD-719.40) CHEST PAIN (ICD-786.50) CERVICAL MUSCLE STRAIN (ICD-847.0) CANDIDIASIS, SKIN (ICD-112.3) G E R D (ICD-530.81) UNS ADVRS EFF UNS RX MEDICINAL&BIOLOGICAL SBSTNC (ICD-995.20) DIVERTICULAR DISEASE (ICD-562.10) HIATAL HERNIA (ICD-553.3) UNSPECIFIED HEMORRHOIDS WITH OTHER COMPLICATION (ICD-455.8) IBS (ICD-564.1) Hyperlipidemia (ICD-272.4) target LDL less than 130 based on positive family history and hypertension Back Pain..............................................................................................Marland KitchenRamos OSTEOPOROSIS (ICD-733.00)      - CT bone densitometry November 16 2006 : PA Spine -2.6  left fem neck -2.9,   right fem neck -2.3                                        RE-DEXA             02/01/09    -2.5                  -2.8                             -2.5      -  intolerant of biphosphonates G E R D    - see EGD 11/19/06  no gross abn's HEALTH MAINTENANCE.............................................................Marland KitchenWert     - dt  2/06     - Pneumovax 2/06     - CPX October 02, 2009     - GYN [facility]  Past Surgical History: Last updated: 10/02/2009 status post remote vaginal  hysterectomy and cholecystectomy appendectomy.  Family History: Last updated: 10/02/2009 Coronary Heart Disease sister at 7 and brother 21's HOCM in older sister C V A / Stroke: Mother age 27 Colon ca sister no breast ca, dm  Social History: Last updated: 10/02/2009 She lives in [place] with her husband.  She is   retired.  She denies tobacco, alcohol, or drug use, and she does not   routinely exercise but is active outdoors.  Never smoker  Risk Factors: Smoking Status: never (08/17/2007)  Review of Systems      See  HPI  Vital Signs:  Patient profile:   70 year old female Height:      61 inches Weight:      154 pounds BMI:     29.20 O2 Sat:      95 % on Room air Temp:     97.6 degrees F oral Pulse rate:   61 / minute BP sitting:   158 / 94  (left arm) Cuff size:   regular  Vitals Entered By: Boone Master CNA/MA (February 19, 2010 3:11 PM)  O2 Flow:  Room air CC: sporadic "weak spells" accompanied with nausea x5 episodes within 1 week Is Patient Diabetic? No Comments Medications reviewed with patient Daytime contact number verified with patient. Boone Master CNA/MA  February 19, 2010 3:11 PM    Physical  Exam  Additional Exam:  GENERAL:  A/Ox3; pleasant & cooperative.NAD   WT   162 September 27, 2008 > 161 February 06, 2009 > 158 May 21, 2009 > 157 October 02, 2009 > 155 January 30, 2010 >>154 7/26 HEENT:  [place]/AT, EOM-wnl, PERRLA, EACs-clear, TMs-wnl, NOSE-clear, THROAT-clear & wnl. NECK:  Supple w/ fair ROM; no JVD; normal carotid impulses w/o bruits; no thyromegaly or nodules palpated; no lymphadenopathy. CHEST:  Clear to P & A; w/o, wheezes/ rales/ or rhonchi. HEART:  RRR, no m/r/g  heard ABDOMEN:  Soft & nt; nml bowel sounds; no organomegaly or masses detected, no guarding or rebound, neg CVA tenderness EXT: Warm bilat,  no calf pain, edema, clubbing, pulses intact    Impression & Recommendations:  Problem # 1:  WEAKNESS (ICD-780.79) Episodes of weakness w/ nausea ? etilogy.  pt is on several meds that she takes in am on empty stomach this could be causing nausea.  will check labs bmet and urine cx.  Plan:  Change Citrucel to at bedtime  Take Ziac w/ food  Change multivitamin to at bedtime  I will call with lab results.  Increase fluid intake.  Please contact office for sooner follow up if symptoms do not improve or worsen    Orders: T-Urine Culture ([facility]) [id number]) TLB-Udip w/ Micro (81001-URINE) Est. Patient Level IV ([address])  Complete Medication List: 1)  Prilosec Otc  20 Mg Tbec (Omeprazole magnesium) .[name].. 1 tab by mouth in the morning before meal 2)  Ecotrin Low Strength 81 Mg Tbec (Aspirin) .... Once daily 3)  Multivitamins Tabs (Multiple vitamin) .... Once daily 4)  Ziac 5-6.25 Mg Tabs (Bisoprolol-hydrochlorothiazide) .... Once daily 5)  Citrucel Powd (Methylcellulose (laxative)) .... Mix with large glass of juice once daily 6)  Simvastatin 20 Mg Tabs (Simvastatin) .... One tablet daily 7)  Oscal 500/200 D-3 500-200 Mg-unit Tabs (Calcium-vitamin d) .... Two times a day 8)  Robitussin Dm 100-10 Mg/61ml Syrp (Dextromethorphan-guaifenesin) .... As directed 9)  Tylenol 325 Mg Tabs (Acetaminophen) .[name].. 1-2 every 4-6 hours as needed 10)  Advil 200 Mg Caps (Ibuprofen) .[name].. 1-3 with meals (three times a day) 11)  Claritin 10 Mg Tabs (Loratadine) .... Once daily as needed 12)  Tramadol Hcl 50 Mg Tabs (Tramadol hcl) .... Every 4 hours as needed  Other Orders: TLB-BMP (Basic Metabolic Panel-BMET) (80048-METABOL)  Patient Instructions: 1)  Change Citrucel to at bedtime  2)  Take Ziac w/ food  3)  Change multivitamin to at bedtime  4)  I will call with lab results.  5)  Increase fluid intake.  6)  Please contact office for sooner follow up if symptoms do not improve or worsen

## 2010-08-27 NOTE — Assessment & Plan Note (Signed)
Summary: Primary svc/ comprehensive eval   Primary Provider/Referring Provider:  Navin Dogan  CC:  cpx fasting.  History of Present Illness:  33  with long-standing hyperlipidemia hypertension and a positive family history of heart disease so ldl target < 130  September 27, 2008 concerned ZO:XWRU she reads in the paper about crestor .  Having occ pressure in chest has had in past but more freq x sev months not necessarily related to activity, maybe better lying down. Lasts sev minutes, epigastric location, no radiation no nausea or diaphoresis or palpitiations or presyncope. No def worsening of chronic positional neck pain on crestor, nor better off it. resolved on maalox.  February 06, 2009 ov for f/u hypelipidemia.  no change in rx  Sept 3 2010 to er for rapid heart rate, eval by [facility], no cp or evidence ihd  May 21, 2009 3 month followup of hbp and cholesterol on rx. no change in rx  October 02, 2009 cpx no tia claudication. no cough or sob.  Pt denies any significant sore throat, dysphagia, itching, sneezing,  nasal congestion or excess secretions,  fever, chills, sweats, unintended wt loss, pleuritic or exertional cp, hempoptysis, change in activity tolerance  orthopnea pnd or leg swelling.   Current Medications (verified): 1)  Prilosec Otc 20 Mg  Tbec (Omeprazole Magnesium) .[name].. 1 Tab By Mouth in The Morning Before Meal 2)  Ecotrin Low Strength 81 Mg  Tbec (Aspirin) .... Once Daily 3)  Multivitamins   Tabs (Multiple Vitamin) .... Once Daily 4)  Ziac 5-6.25 Mg  Tabs (Bisoprolol-Hydrochlorothiazide) .... Once Daily 5)  Citrucel   Powd (Methylcellulose (Laxative)) .... Mix With Large Glass of Juice Once Daily 6)  Crestor 10 Mg  Tabs (Rosuvastatin Calcium) .... Take 1/2 Tab By Mouth At Bedtime 7)  Oscal 500/200 D-3 500-200 Mg-Unit  Tabs (Calcium-Vitamin D) .... Two Times A Day 8)  Robitussin Dm 100-10 Mg/69ml  Syrp (Dextromethorphan-Guaifenesin) .... As Directed 9)  Tylenol 325 Mg  Tabs  (Acetaminophen) .[name].. 1-2 Every 4-6 Hours As Needed 10)  Advil 200 Mg  Caps (Ibuprofen) .[name].. 1-3 With Meals (Three Times A Day) 11)  Claritin 10 Mg  Tabs (Loratadine) .... Once Daily As Needed 12)  Tramadol Hcl 50 Mg  Tabs (Tramadol Hcl) .... Every 4 Hours As Needed  Allergies (verified): No Known Drug Allergies  Past History:  Past Medical History: HYPERTENSION (ICD-401.9) BEN HTN HEART DISEASE WITHOUT HEART FAIL (ICD-402.10) CORONARY HEART DISEASE (ICD-V17.3) PLANTAR FASCIITIS (ICD-728.71) COLONIC POLYPS (ICD-211.3)Colon polyps and Diverticulosis........Marland KitchenRussella Dar      -colonoscopy 11/20/2005 for recall 10/2010 OBESITY (ICD-278.00) PALPITATIONS (ICD-785.1).................................................................Marland KitchenNishan ARTHRALGIA UNSPECIFIED SITE (ICD-719.40) CHEST PAIN (ICD-786.50) CERVICAL MUSCLE STRAIN (ICD-847.0) CANDIDIASIS, SKIN (ICD-112.3) G E R D (ICD-530.81) UNS ADVRS EFF UNS RX MEDICINAL&BIOLOGICAL SBSTNC (ICD-995.20) DIVERTICULAR DISEASE (ICD-562.10) HIATAL HERNIA (ICD-553.3) UNSPECIFIED HEMORRHOIDS WITH OTHER COMPLICATION (ICD-455.8) IBS (ICD-564.1) Hyperlipidemia (ICD-272.4) target LDL less than 130 based on positive family history and hypertension Back Pain..............................................................................................Marland KitchenRamos OSTEOPOROSIS (ICD-733.00)      - CT bone densitometry November 16 2006 : PA Spine -2.6  left fem neck -2.9,   right fem neck -2.3                                        RE-DEXA             02/01/09    -2.5                  -  2.8                             -2.5      -  intolerant of biphosphonates G E R D    - see EGD 11/19/06  no gross abn's HEALTH MAINTENANCE     - dt  2/06     - Pneumovax 2/06     - CPX October 02, 2009     - GYN [place]  Past Surgical History: status post remote vaginal  hysterectomy and cholecystectomy appendectomy.  Family History: Coronary Heart Disease sister at 70 and brother  65's HOCM in older sister C V A / Stroke: Mother age 67 Colon ca sister no breast ca, dm  Social History: She lives in [place] with her husband.  She is   retired.  She denies tobacco, alcohol, or drug use, and she does not   routinely exercise but is active outdoors.  Never smoker  Vital Signs:  Patient profile:   70 year old female Height:      61 inches Weight:      157 pounds O2 Sat:      98 % on Room air Temp:     97.7 degrees F oral Pulse rate:   62 / minute BP sitting:   120 / 78  (left arm)  Vitals Entered By: Vernie Murders (October 02, 2009 8:46 AM)  O2 Flow:  Room air  Physical Exam  Additional Exam:  GENERAL:  A/Ox3; pleasant & cooperative.NAD   WT   162 September 27, 2008 > 161 February 06, 2009 > 158 May 21, 2009 > 157 October 02, 2009  HEENT:  [place]/AT, EOM-wnl, PERRLA, EACs-clear, TMs-wnl, NOSE-clear, THROAT-clear & wnl. NECK:  Supple w/ fair ROM; no JVD; normal carotid impulses w/o bruits; no thyromegaly or nodules palpated; no lymphadenopathy. CHEST:  Clear to P & A; w/o, wheezes/ rales/ or rhonchi. HEART:  RRR, no m/r/g  heard ABDOMEN:  Soft & nt; nml bowel sounds; no organomegaly or masses detected. EXT: Warm bilat,  no calf pain, edema, clubbing, pulses intact    Cholesterol               199 mg/dL                   1-610     ATP III Classification            Desirable:  < 200 mg/dL                    Borderline High:  200 - 239 mg/dL               High:  > = 240 mg/dL   Triglycerides        [H]  177.0 mg/dL                 9.6-045.4     Normal:  <150 mg/dL     Borderline High:  098 - 199 mg/dL   HDL                       11.91 mg/dL                 >47.82   VLDL Cholesterol          35.4 mg/dL  0.0-40.0   LDL Cholesterol      [H]  161 mg/dL                   0-96  CHO/HDL Ratio:  CHD Risk                             3                    Men          Women     1/2 Average Risk     3.4          3.3     Average Risk          5.0           4.4     2X Average Risk          9.6          7.1     3X Average Risk          15.0          11.0                           Tests: (2) BMP (METABOL)   Sodium                    143 mEq/L                   135-145   Potassium                 4.2 mEq/L                   3.5-5.1   Chloride                  106 mEq/L                   96-112   Carbon Dioxide       [H]  33 mEq/L                    19-32   Glucose              [H]  106 mg/dL                   04-54   BUN                       12 mg/dL                    0-98   Creatinine                0.7 mg/dL                   1.1-9.1   Calcium                   9.8 mg/dL                   4.7-82.9   GFR                       87.80 mL/min                >60  Tests: (3) CBC Platelet w/Diff (CBCD)   White Cell Count          6.4 K/uL                    4.5-10.5   Red Cell Count            4.58 Mil/uL                 3.87-5.11   Hemoglobin                13.8 g/dL                   40.3-47.4   Hematocrit                40.7 %                      36.0-46.0   MCV                       88.8 fl                     78.0-100.0   MCHC                      33.8 g/dL                   25.9-56.3   RDW                       12.4 %                      11.5-14.6   Platelet Count            225.0 K/uL                  150.0-400.0   Neutrophil %              59.3 %                      43.0-77.0   Lymphocyte %              29.9 %                      12.0-46.0   Monocyte %                6.7 %                       3.0-12.0   Eosinophils%              3.5 %                       0.0-5.0   Basophils %               0.6 %                       0.0-3.0   Neutrophill Absolute      3.9 K/uL                    1.4-7.7   Lymphocyte Absolute       1.9 K/uL  0.7-4.0   Monocyte Absolute         0.4 K/uL                    0.1-1.0  Eosinophils, Absolute                             0.2 K/uL                    0.0-0.7   Basophils Absolute         0.0 K/uL                    0.0-0.1  Tests: (4) Hepatic/Liver Function Panel (HEPATIC)   Total Bilirubin      [H]  1.4 mg/dL                   1.6-1.0   Direct Bilirubin          0.2 mg/dL                   9.6-0.4   Alkaline Phosphatase      47 U/L                      39-117   AST                       22 U/L                      0-37   ALT                       18 U/L                      0-35   Total Protein             7.8 g/dL                    5.4-0.9   Albumin                   4.3 g/dL                    8.1-1.9  Tests: (5) TSH (TSH)   FastTSH                   4.13 uIU/mL                 0.35-5.50  Tests: (6) Full Range CRP (FCRP)   Full Range CRP            0.90 mg/L                   0.00-5.00     Note:  An elevated hs-CRP (>5 mg/L) should be repeated after 2 weeks to rule out recent infection or trauma.  Tests: (7) UDip w/Micro (URINE)   Color                     LT. YELLOW       RANGE:  Yellow;Lt. Yellow   Clarity                   CLEAR  Clear   Specific Gravity          1.025                       1.000 - 1.030   Urine Ph                  6.0                         5.0-8.0   Protein                   NEGATIVE                    Negative   Urine Glucose             NEGATIVE                    Negative   Ketones                   NEGATIVE                    Negative   Urine Bilirubin           NEGATIVE                    Negative   Blood                     TRACE-INTACT                Negative   Urobilinogen              0.2                         0.0 - 1.0   Leukocyte Esterace        TRACE                       Negative   Nitrite                   NEGATIVE                    Negative   Urine WBC                 0-2/hpf                     0-2/hpf   Urine RBC                 0-2/hpf                     0-2/hpf   Urine Epith               Rare(0-4/hpf)               Rare(0-4/hpf)  EKG  Procedure date:  10/02/2009  Findings:      nsr,  wnl  CXR  Procedure date:  10/02/2009  Findings:      Comparison: 03/30/2009   Findings: Cardiomegaly is identified. The lungs are clear. There is no evidence of focal airspace disease, pulmonary edema, pleural effusion, or pneumothorax. No acute bony abnormalities are identified.   IMPRESSION: Cardiomegaly without evidence of active cardiopulmonary disease.  Impression & Recommendations:  Problem # 1:  HYPERTENSION (ICD-401.9) ok on rx Her updated medication list for this problem includes:    Ziac 5-6.25 Mg Tabs (Bisoprolol-hydrochlorothiazide) ..... Once daily  Orders: EKG w/ Interpretation (93000) TLB-Udip ONLY (81003-UDIP) T-2 View CXR (71020TC)  Problem # 2:  HYPERLIPIDEMIA (ICD-272.4)  The following medications were removed from the medication list:    Crestor 10 Mg Tabs (Rosuvastatin calcium) .[name]... Take 1/2 tab by mouth at bedtime Her updated medication list for this problem includes:    Simvastatin 20 Mg Tabs (Simvastatin) ..... One tablet daily with target < 130  Labs Reviewed: SGOT: 20 (05/21/2009)   SGPT: 18 (05/21/2009)   HDL:48.90 (05/21/2009), 47.30 (02/06/2009)  LDL:112 (05/21/2009 )  LDL  106 October 02, 2009   161 (02/06/2009)  Chol:194 (05/21/2009), 182 (02/06/2009)  Trig:168.0 (05/21/2009), 170.0 (02/06/2009)  Problem # 3:  OSTEOPOROSIS (ICD-733.00) check vit d consider reclast if not already eval for it since has GERD and intol of biphosphonates  Problem # 4:  G E R D (ICD-530.81)  Her updated medication list for this problem includes:    Prilosec Otc 20 Mg Tbec (Omeprazole magnesium) .[name]... 1 tab by mouth in the morning before meal  Medications Added to Medication List This Visit: 1)  Simvastatin 20 Mg Tabs (Simvastatin) .... One tablet daily  Other Orders: T-Vitamin D (25-Hydroxy) [phone]) TLB-Lipid Panel (80061-LIPID) TLB-BMP (Basic Metabolic Panel-BMET) (80048-METABOL) TLB-CBC Platelet - w/Differential  (85025-CBCD) TLB-Hepatic/Liver Function Pnl (80076-HEPATIC) TLB-TSH (Thyroid Stimulating Hormone) (84443-TSH) TLB-CRP-High Sensitivity (C-Reactive Protein) (86140-FCRP) Est. Patient 65& > (11914)  Patient Instructions: 1)  Call [phone] for your results w/in next 3 days - if there's something important  I feel you need to know,  I'll be in touch with you directly.  2)  Return to office in 3 months, sooner if needed fasting for recheck cholesterol on new medication = simvastatin in place of crestor  3)  ADD:  CONSIDER RECLAST EVAL NEXT OV Prescriptions: SIMVASTATIN 20 MG  TABS (SIMVASTATIN) One tablet daily  #90 x 3   Entered and Authorized by:   Nyoka Cowden MD   Signed by:   Nyoka Cowden MD on 10/02/2009   Method used:   Print then Give to Patient   RxID:   [id number]    Cardio[employer] ECG  ID: [id number] Patient: [name] [name] DOB: 02/08/1941 Age: 70 Years Old Sex: Female Race: White Height: 61 Weight: 157 Status: Unconfirmed Past Medical History:  Palpitations................................................................................Marland KitchenNishan HYPERTENSION (ICD-401.9) BEN HTN HEART DISEASE WITHOUT HEART FAIL (ICD-402.10) CORONARY HEART DISEASE (ICD-V17.3) PLANTAR FASCIITIS (ICD-728.71) COLONIC POLYPS (ICD-211.3)Colon polyps and Diverticulosis      -colonoscopy 11/20/2005 for recall 10/2010 OBESITY (ICD-278.00) PALPITATIONS (ICD-785.1) ARTHRALGIA UNSPECIFIED SITE (ICD-719.40) CHEST PAIN (ICD-786.50) CERVICAL MUSCLE STRAIN (ICD-847.0) CANDIDIASIS, SKIN (ICD-112.3) G E R D (ICD-530.81) UNS ADVRS EFF UNS RX MEDICINAL&BIOLOGICAL SBSTNC (ICD-995.20) DIVERTICULAR DISEASE (ICD-562.10) HIATAL HERNIA (ICD-553.3) UNSPECIFIED HEMORRHOIDS WITH OTHER COMPLICATION (ICD-455.8) IBS (ICD-564.1) Hyperlipidemia (ICD-272.4) target LDL less than 130 based on positive family history and hypertension Back  Pain..............................................................................................Marland KitchenRamos OSTEOPOROSIS (ICD-733.00)      - CT bone densitometry November 16 2006 : [place] Spine -2.6  left fem neck -2.9,   right fem neck -2.3                                        RE-DEXA             02/01/09    -  2.5                  -2.8                             -2.5      -  intolerant of biphosphonate G E R D    - see EGD 11/19/06  no gross abn's HEALTH MAINTENANCE     - dt  2/06     - Pneumovax 2/06     - CPX September 27, 2008   Recorded: 10/02/2009 08:56 AM P/PR: 123 ms / 240 ms - Heart rate (maximum exercise) QRS: 93 QT/QTc/QTd: 405 ms / 407 ms / 39 ms - Heart rate (maximum exercise)  P/QRS/T axis: 49 deg / 50 deg / 56 deg - Heart rate (maximum exercise)  Heartrate: 61 bpm  Interpretation:  nsr, wnl   EKG  Procedure date:  10/02/2009  Findings:      nsr, wnl  CXR  Procedure date:  10/02/2009  Findings:      Comparison: 03/30/2009   Findings: Cardiomegaly is identified. The lungs are clear. There is no evidence of focal airspace disease, pulmonary edema, pleural effusion, or pneumothorax. No acute bony abnormalities are identified.   IMPRESSION: Cardiomegaly without evidence of active cardiopulmonary disease.

## 2010-08-27 NOTE — Assessment & Plan Note (Signed)
Summary: Primary svc/ f/u hbp, chol   Primary Provider/Referring Provider:  Jakki Doughty  CC:  3 month followup.  Pt denies any complaints today.[name]  History of Present Illness:  15 yowf never smoker with long-standing hyperlipidemia hypertension and a positive family history of heart disease so ldl target < 130  September 27, 2008 concerned EX:BMWU she reads in the paper about crestor .  Having occ pressure in chest has had in past but more freq x sev months not necessarily related to activity, maybe better lying down. Lasts sev minutes, epigastric location, no radiation no nausea or diaphoresis or palpitiations or presyncope. No def worsening of chronic positional neck pain on crestor, nor better off it. resolved on maalox.  February 06, 2009 ov for f/u hypelipidemia.  no change in rx  Sept 3 2010 to er for rapid heart rate, eval by [facility], no cp or evidence ihd  May 21, 2009 3 month followup of hbp and cholesterol on rx. no change in rx  October 02, 2009 cpx no tia claudication. no cough or sob.  rec change to simvostatin and consider reclast  January 30, 2010 3 month followup. no cp, sob, tia, leg swelling or aching muscles or sob. Pt denies any significant sore throat, dysphagia, itching, sneezing,  nasal congestion or excess secretions,  fever, chills, sweats, unintended wt loss, pleuritic or exertional cp, hempoptysis, change in activity tolerance  orthopnea pnd or leg swelling   Current Medications (verified): 1)  Prilosec Otc 20 Mg  Tbec (Omeprazole Magnesium) .[name].. 1 Tab By Mouth in The Morning Before Meal 2)  Ecotrin Low Strength 81 Mg  Tbec (Aspirin) .... Once Daily 3)  Multivitamins   Tabs (Multiple Vitamin) .... Once Daily 4)  Ziac 5-6.25 Mg  Tabs (Bisoprolol-Hydrochlorothiazide) .... Once Daily 5)  Citrucel   Powd (Methylcellulose (Laxative)) .... Mix With Large Glass of Juice Once Daily 6)  Simvastatin 20 Mg  Tabs (Simvastatin) .... One Tablet Daily 7)  Oscal 500/200 D-3 500-200  Mg-Unit  Tabs (Calcium-Vitamin D) .... Two Times A Day 8)  Robitussin Dm 100-10 Mg/103ml  Syrp (Dextromethorphan-Guaifenesin) .... As Directed 9)  Tylenol 325 Mg  Tabs (Acetaminophen) .[name].. 1-2 Every 4-6 Hours As Needed 10)  Advil 200 Mg  Caps (Ibuprofen) .[name].. 1-3 With Meals (Three Times A Day) 11)  Claritin 10 Mg  Tabs (Loratadine) .... Once Daily As Needed 12)  Tramadol Hcl 50 Mg  Tabs (Tramadol Hcl) .... Every 4 Hours As Needed  Allergies (verified): No Known Drug Allergies  Past History:  Past Medical History: HYPERTENSION (ICD-401.9) BEN HTN HEART DISEASE WITHOUT HEART FAIL (ICD-402.10) CORONARY HEART DISEASE (ICD-V17.3) PLANTAR FASCIITIS (ICD-728.71) COLONIC POLYPS (ICD-211.3)Colon polyps and Diverticulosis........Marland KitchenRussella Dar      -colonoscopy 11/20/2005 for recall 10/2010 OBESITY (ICD-278.00) PALPITATIONS (ICD-785.1).................................................................Marland KitchenNishan ARTHRALGIA UNSPECIFIED SITE (ICD-719.40) CHEST PAIN (ICD-786.50) CERVICAL MUSCLE STRAIN (ICD-847.0) CANDIDIASIS, SKIN (ICD-112.3) G E R D (ICD-530.81) UNS ADVRS EFF UNS RX MEDICINAL&BIOLOGICAL SBSTNC (ICD-995.20) DIVERTICULAR DISEASE (ICD-562.10) HIATAL HERNIA (ICD-553.3) UNSPECIFIED HEMORRHOIDS WITH OTHER COMPLICATION (ICD-455.8) IBS (ICD-564.1) Hyperlipidemia (ICD-272.4) target LDL less than 130 based on positive family history and hypertension Back Pain..............................................................................................Marland KitchenRamos OSTEOPOROSIS (ICD-733.00)      - CT bone densitometry November 16 2006 : PA Spine -2.6  left fem neck -2.9,   right fem neck -2.3  RE-DEXA             02/01/09    -2.5                  -2.8                             -2.5      -  intolerant of biphosphonates G E R D    - see EGD 11/19/06  no gross abn's HEALTH MAINTENANCE.............................................................Marland KitchenWert     - dt  2/06     -  Pneumovax 2/06     - CPX October 02, 2009     - GYN [place]  Vital Signs:  Patient profile:   70 year old female Weight:      155 pounds O2 Sat:      96 % on Room air Temp:     98.3 degrees F oral Pulse rate:   62 / minute BP sitting:   148 / 94  (left arm)  Vitals Entered By: Vernie Murders (January 30, 2010 9:21 AM)  O2 Flow:  Room air  Physical Exam  Additional Exam:  GENERAL:  A/Ox3; pleasant & cooperative.NAD   WT   162 September 27, 2008 > 161 February 06, 2009 > 158 May 21, 2009 > 157 October 02, 2009 > 155 January 30, 2010  HEENT:  [place]/AT, EOM-wnl, PERRLA, EACs-clear, TMs-wnl, NOSE-clear, THROAT-clear & wnl. NECK:  Supple w/ fair ROM; no JVD; normal carotid impulses w/o bruits; no thyromegaly or nodules palpated; no lymphadenopathy. CHEST:  Clear to P & A; w/o, wheezes/ rales/ or rhonchi. HEART:  RRR, no m/r/g  heard ABDOMEN:  Soft & nt; nml bowel sounds; no organomegaly or masses detected. EXT: Warm bilat,  no calf pain, edema, clubbing, pulses intact    Cholesterol          [H]  206 mg/dL                   1-610     ATP III Classification            Desirable:  < 200 mg/dL                    Borderline High:  200 - 239 mg/dL               High:  > = 240 mg/dL   Triglycerides        [H]  264.0 mg/dL                 9.6-045.4     Normal:  <150 mg/dL     Borderline High:  098 - 199 mg/dL   HDL                       11.91 mg/dL                 >47.82   VLDL Cholesterol     [H]  52.8 mg/dL                  9.5-62.1  CHO/HDL Ratio:  CHD Risk                             4  Men          Women     1/2 Average Risk     3.4          3.3     Average Risk          5.0          4.4     2X Average Risk          9.6          7.1     3X Average Risk          15.0          11.0                           Tests: (2) Hepatic/Liver Function Panel (HEPATIC)   Total Bilirubin      [H]  1.6 mg/dL                   1.6-1.0   Direct Bilirubin          0.2 mg/dL                    9.6-0.4   Alkaline Phosphatase      41 U/L                      39-117   AST                       20 U/L                      0-37   ALT                       17 U/L                      0-35   Total Protein             7.4 g/dL                    5.4-0.9   Albumin                   4.3 g/dL                    8.1-1.9  Tests: (3) Cholesterol LDL - Direct (DIRLDL)  Cholesterol LDL - Direct                             118.9 mg/dL  Impression & Recommendations:  Problem # 1:  HYPERLIPIDEMIA (ICD-272.4) Target < 130 due to HBP Her updated medication list for this problem includes:    Simvastatin 20 Mg Tabs (Simvastatin) ..... One tablet daily  Labs Reviewed: SGOT: 22 (10/02/2009)   SGPT: 18 (10/02/2009)   HDL:57.20 (10/02/2009), 48.90 (05/21/2009)  LDL:106 (10/02/2009), 112 (05/21/2009) > 128 January 30, 2010 so no change   Chol:199 (10/02/2009), 194 (05/21/2009)  Trig:177.0 (10/02/2009), 168.0 (05/21/2009)  Problem # 2:  HYPERTENSION (ICD-401.9)  Her updated medication list for this problem includes:    Ziac 5-6.25 Mg Tabs (Bisoprolol-hydrochlorothiazide) ..... Once daily  not ideal, work on diet  Each maintenance medication was reviewed in detail including most importantly the difference between maintenance prns and under what circumstances the  prns are to be used.  In addition, these two groups (for which the patient should keep up with refills) were distinguished from a third group :  meds that are used only short term with the intent to complete a course of therapy and then not refill them.  The med list was then fully reconciled and reorganized to reflect this important distinction.   Orders: Est. Patient Level III (04540)  Other Orders: TLB-Lipid Panel (80061-LIPID) TLB-Hepatic/Liver Function Pnl (80076-HEPATIC)  Patient Instructions: 1)  Return to office in 3 months, sooner if needed to see Tammy to recheck BP and discuss Reclast 2)  Avoid salt or salty foods

## 2010-10-09 ENCOUNTER — Encounter: Payer: Self-pay | Admitting: Internal Medicine

## 2010-10-29 ENCOUNTER — Other Ambulatory Visit: Payer: Self-pay | Admitting: Internal Medicine

## 2010-10-29 ENCOUNTER — Encounter: Payer: Self-pay | Admitting: Internal Medicine

## 2010-10-29 ENCOUNTER — Ambulatory Visit (INDEPENDENT_AMBULATORY_CARE_PROVIDER_SITE_OTHER): Payer: Medicare Other | Admitting: Internal Medicine

## 2010-10-29 ENCOUNTER — Ambulatory Visit (INDEPENDENT_AMBULATORY_CARE_PROVIDER_SITE_OTHER)
Admission: RE | Admit: 2010-10-29 | Discharge: 2010-10-29 | Disposition: A | Discharge status: Home / Self Care | Payer: Medicare Other | Source: Ambulatory Visit | Attending: Internal Medicine | Admitting: Internal Medicine

## 2010-10-29 ENCOUNTER — Other Ambulatory Visit (INDEPENDENT_AMBULATORY_CARE_PROVIDER_SITE_OTHER): Payer: Medicare Other

## 2010-10-29 ENCOUNTER — Other Ambulatory Visit (INDEPENDENT_AMBULATORY_CARE_PROVIDER_SITE_OTHER): Payer: Medicare Other | Admitting: Internal Medicine

## 2010-10-29 VITALS — BP 132/82 | HR 67 | Temp 98.0°F | Ht 60.75 in | Wt 155.2 lb

## 2010-10-29 DIAGNOSIS — M810 Age-related osteoporosis without current pathological fracture: Secondary | ICD-10-CM

## 2010-10-29 DIAGNOSIS — I10 Essential (primary) hypertension: Secondary | ICD-10-CM

## 2010-10-29 DIAGNOSIS — Z0000 Encounter for general adult medical examination without abnormal findings: Secondary | ICD-10-CM

## 2010-10-29 DIAGNOSIS — E785 Hyperlipidemia, unspecified: Secondary | ICD-10-CM

## 2010-10-29 DIAGNOSIS — I119 Hypertensive heart disease without heart failure: Secondary | ICD-10-CM

## 2010-10-29 DIAGNOSIS — M2550 Pain in unspecified joint: Secondary | ICD-10-CM

## 2010-10-29 DIAGNOSIS — M1990 Unspecified osteoarthritis, unspecified site: Secondary | ICD-10-CM

## 2010-10-29 LAB — CBC WITH DIFFERENTIAL/PLATELET
Basophils Absolute: 0 10*3/uL (ref 0.0–0.1)
Basophils Relative: 0.4 % (ref 0.0–3.0)
Eosinophils Absolute: 0.2 10*3/uL (ref 0.0–0.7)
HCT: 41.5 % (ref 36.0–46.0)
Hemoglobin: 14.3 g/dL (ref 12.0–15.0)
Lymphs Abs: 1.9 10*3/uL (ref 0.7–4.0)
MCHC: 34.5 g/dL (ref 30.0–36.0)
Monocytes Relative: 7 % (ref 3.0–12.0)
Neutro Abs: 4.2 10*3/uL (ref 1.4–7.7)
RBC: 4.69 Mil/uL (ref 3.87–5.11)
RDW: 13.7 % (ref 11.5–14.6)

## 2010-10-29 LAB — URINALYSIS, ROUTINE W REFLEX MICROSCOPIC
Bilirubin Urine: NEGATIVE
Nitrite: NEGATIVE
Total Protein, Urine: NEGATIVE
Urobilinogen, UA: 0.2 (ref 0.0–1.0)

## 2010-10-29 NOTE — Progress Notes (Signed)
Subjective:    Patient ID: [name] [name], female    DOB: 07/12/1941, 70 y.o.   MRN: [id number]  HPI 44 yowf never smoker with long-standing hyperlipidemia hypertension and a positive family history of heart disease so ldl target < 130  September 27, 2008 concerned WU:JWJX she reads in the paper about crestor . Having occ pressure in chest has had in past but more freq x sev months not necessarily related to activity, maybe better lying down. Lasts sev minutes, epigastric location, no radiation no nausea or diaphoresis or palpitiations or presyncope. No def worsening of chronic positional neck pain on crestor, nor better off it. resolved on maalox.   Sept 3 2010 to er for rapid heart rate, eval by Eden Emms, no cp or evidence ihd   May 21, 2009 3 month followup of hbp and cholesterol on rx. no change in rx   October 02, 2009 cpx no tia claudication. no cough or sob. rec change to simvostatin and consider reclast   January 30, 2010 3 month followup. no cp, sob, tia, leg swelling or aching muscles or sob.   February 19, 2010--Presents for a work in visit. Compalins of sporadic "weak spells" accompanied with nausea x5 episodes within 1 week.. no change in rx   May 31, 2010 ov c/o spells= weak all over sev x per week lasting a few minutes for up to an hour, sometimes when bends over at waist. checks her own pulse and appears normal during the spells. no problem with activity or presyncope, just weak all over. no amaurosis or viz or localized neuro changes or vertigo.   10/29/2010  cpx new c/o mid back and circ chest discomfort after fell striking both knees and sending a jolt up through her back. Positional. Has not tried nsaids yet  Pt denies any significant sore throat, dysphagia, itching, sneezing,  nasal congestion or excess/ purulent secretions,  fever, chills, sweats, unintended wt loss, pleuritic or exertional cp, hempoptysis, orthopnea pnd or leg swelling.    Also denies any obvious fluctuation of  symptoms with weather or environmental changes or other aggravating or alleviating factors.     Past Medical History:  HYPERTENSION (ICD-401.9)  BEN HTN HEART DISEASE WITHOUT HEART FAIL (ICD-402.10)  CORONARY HEART DISEASE (ICD-V17.3)  PLANTAR FASCIITIS (ICD-728.71)  COLONIC POLYPS (ICD-211.3)Colon polyps and Diverticulosis........Marland KitchenRussella Dar  -colonoscopy 11/20/2005 for recall 10/2010  OBESITY (ICD-278.00)  PALPITATIONS (ICD-785.1)..................................................................Marland KitchenNishan  ARTHRALGIA UNSPECIFIED SITE (ICD-719.40)  CHEST PAIN (ICD-786.50)  CERVICAL MUSCLE STRAIN (ICD-847.0)  CANDIDIASIS, SKIN (ICD-112.3)  G E R D (ICD-530.81)  UNS ADVRS EFF UNS RX MEDICINAL&BIOLOGICAL SBSTNC (ICD-995.20)  DIVERTICULAR DISEASE (ICD-562.10)  HIATAL HERNIA (ICD-553.3)  UNSPECIFIED HEMORRHOIDS WITH OTHER COMPLICATION (ICD-455.8)  IBS (ICD-564.1)  Hyperlipidemia (ICD-272.4) target LDL less than 130 based on positive family history and hypertension  Back Pain..............................................................................................Marland KitchenRamos  OSTEOPOROSIS (ICD-733.00)  - CT bone densitometry November 16 2006 : PA Spine -2.6 left fem neck -2.9, right fem neck -2.3  RE-DEXA 02/01/09 -2.5 -2.8 -2.5  - intolerant of biphosphonates  G E R D  - see EGD 11/19/06 no gross abn's  HEALTH MAINTENANCE.............................................................Marland KitchenWert  - dt 2/06  - Pneumovax 2/06  - CPX  10/29/2010   - G[name]        Review of Systems     Objective:   Physical Exam    WT 162 September 27, 2008 > 161 February 06, 2009 > 158 May 21, 2009 > 157 October 02, 2009 > 10/29/10  155  HEENT: [place]/AT, EOM-wnl, PERRLA,  EACs-clear, TMs-wnl, NOSE-clear, THROAT-clear & wnl.  NECK: Supple w/ fair ROM; no JVD; normal carotid impulses w/o bruits; no thyromegaly or nodules palpated; no lymphadenopathy.  CHEST: Clear to P & A; w/o, wheezes/ rales/ or rhonchi.  HEART: RRR, no m/r/g  heard  ABDOMEN: Soft & nt; nml bowel sounds; no organomegaly or masses detected, no guarding or rebound, neg CVA tenderness  EXT: Warm bilat, no calf pain, edema, clubbing, pulses intact     Assessment & Plan:

## 2010-10-29 NOTE — Patient Instructions (Signed)
Call for lab results in 2 days  See calendar for specific medication instructions and bring it back for each and every office visit for every healthcare provider you see.  Without it,  you may not receive the best quality medical care that we feel you deserve.  You will note that the calendar groups together  your maintenance  medications that are timed at particular times of the day.  Think of this as your checklist for what your doctor has instructed you to do until your next evaluation to see what benefit  there is  to staying on a consistent group of medications intended to keep you well.  The other group at the bottom is entirely up to you to use as you see fit  for specific symptoms that may arise between visits that require you to treat them on an as needed basis.  Think of this as your action plan or "what if" list.   Separating the top medications from the bottom group is fundamental to providing you adequate care going forward.   If chest and back not better with Advil please call Dr Ethelene Hal for appt  See Tammy NP w/in 6 weeks with all your medications, even over the counter meds, separated in two separate bags, the ones you take no matter what vs the ones you stop once you feel better and take only as needed when you feel you need them.   Tammy  will generate for you a new user friendly medication calendar that will put [place] all on the same page re: your medication use.     Without this process, it simply isn't possible to assure that we are providing  your outpatient care  with  the attention to detail we feel you deserve.   If we cannot assure that you're getting that kind of care,  then we cannot manage your problem effectively from this clinic.  Once you have seen Tammy and we are sure that we're all on the same page with your medication use she will arrange follow up with me.   Tammy will discuss reclast with you

## 2010-10-30 LAB — TSH: TSH: 2.37 u[IU]/mL (ref 0.35–5.50)

## 2010-10-30 LAB — BASIC METABOLIC PANEL
CO2: 31 mEq/L (ref 19–32)
Chloride: 104 mEq/L (ref 96–112)
Glucose, Bld: 98 mg/dL (ref 70–99)
Potassium: 4.3 mEq/L (ref 3.5–5.1)
Sodium: 143 mEq/L (ref 135–145)

## 2010-10-30 LAB — LIPID PANEL
HDL: 49.4 mg/dL (ref >39.00)
VLDL: 41.2 mg/dL — ABNORMAL HIGH (ref 0.0–40.0)

## 2010-10-30 LAB — LDL CHOLESTEROL, DIRECT: Direct LDL: 126.6 mg/dL

## 2010-10-30 NOTE — Assessment & Plan Note (Addendum)
Vit D ok level  Discussed in detail all the  indications, usual  risks and alternatives  relative to the benefits with patient who agrees to proceed with reclast rx

## 2010-10-30 NOTE — Assessment & Plan Note (Signed)
Ok on rx 

## 2010-10-30 NOTE — Assessment & Plan Note (Signed)
No findings on exam ? Has compression fx from osteo.  rec nsaids and f/u ramos prn

## 2010-10-30 NOTE — Assessment & Plan Note (Signed)
At target on rx, no change needed

## 2010-10-31 ENCOUNTER — Telehealth: Payer: Self-pay | Admitting: Internal Medicine

## 2010-10-31 NOTE — Telephone Encounter (Signed)
Faxed All Cardiac over to [facility] @ [phone] 10/31/10/KM

## 2010-11-01 LAB — VITAMIN D 1,25 DIHYDROXY
Vitamin D 1, 25 (OH)2 Total: 45 pg/mL (ref 18–72)
Vitamin D2 1, 25 (OH)2: <8 pg/mL
Vitamin D3 1, 25 (OH)2: 45 pg/mL

## 2010-11-01 LAB — BASIC METABOLIC PANEL
BUN: 11 mg/dL (ref 6–23)
Calcium: 9.5 mg/dL (ref 8.4–10.5)
GFR calc non Af Amer: >60 mL/min (ref >60)
Glucose, Bld: 123 mg/dL — ABNORMAL HIGH (ref 70–99)
Sodium: 138 mEq/L (ref 135–145)

## 2010-11-01 LAB — CBC
Hemoglobin: 14.5 g/dL (ref 12.0–15.0)
Platelets: 211 10*3/uL (ref 150–400)
RDW: 13.3 % (ref 11.5–15.5)

## 2010-11-01 LAB — URINE MICROSCOPIC-ADD ON

## 2010-11-01 LAB — DIFFERENTIAL
Basophils Absolute: 0 10*3/uL (ref 0.0–0.1)
Basophils Relative: 1 % (ref 0–1)
Eosinophils Absolute: 0.1 10*3/uL (ref 0.0–0.7)
Eosinophils Relative: 2 % (ref 0–5)

## 2010-11-01 LAB — URINALYSIS, ROUTINE W REFLEX MICROSCOPIC
Bilirubin Urine: NEGATIVE
Nitrite: NEGATIVE
Specific Gravity, Urine: 1.006 (ref 1.005–1.030)
pH: 7.5 (ref 5.0–8.0)

## 2010-11-01 LAB — URINE CULTURE

## 2010-11-01 LAB — CK TOTAL AND CKMB (NOT AT ARMC)
CK, MB: 1.8 ng/mL (ref 0.3–4.0)
Relative Index: 1.6 (ref 0.0–2.5)
Total CK: 112 U/L (ref 7–177)

## 2010-11-01 LAB — MAGNESIUM: Magnesium: 2 mg/dL (ref 1.5–2.5)

## 2010-11-05 NOTE — Progress Notes (Signed)
Quick Note:  Spoke with pt and notified of results per Dr. [name]. Pt verbalized understanding and denied any questions.  ______ 

## 2010-11-11 ENCOUNTER — Telehealth: Payer: Self-pay | Admitting: Internal Medicine

## 2010-11-11 DIAGNOSIS — E785 Hyperlipidemia, unspecified: Secondary | ICD-10-CM

## 2010-11-11 DIAGNOSIS — I10 Essential (primary) hypertension: Secondary | ICD-10-CM

## 2010-11-11 MED ORDER — SIMVASTATIN 20 MG PO TABS: 20.0000 mg | ORAL_TABLET | Freq: Every day | ORAL | Status: DC

## 2010-11-11 MED ORDER — BISOPROLOL-HYDROCHLOROTHIAZIDE 5-6.25 MG PO TABS: 1.0000 | ORAL_TABLET | Freq: Every day | ORAL | Status: DC

## 2010-11-11 NOTE — Telephone Encounter (Signed)
RX sent to [facility] for Bisoprolol HCT and Simvastatin. Pt aware.

## 2010-11-25 DIAGNOSIS — I10 Essential (primary) hypertension: Secondary | ICD-10-CM | POA: Insufficient documentation

## 2010-11-25 NOTE — Assessment & Plan Note (Signed)
Ok on rx 

## 2010-12-10 ENCOUNTER — Encounter: Payer: Self-pay | Admitting: *Deleted

## 2010-12-10 ENCOUNTER — Encounter: Payer: Medicare Other | Admitting: Adult Health

## 2010-12-10 NOTE — Assessment & Plan Note (Signed)
[facility]                             PULMONARY OFFICE NOTE   NAME:[name] [name], [name] [name]                         MRN:          [id number]  DATE:02/15/2007                            DOB:          June 03, 1941    PRIMARY SERVICE EXTENDED FOLLOWUP OFFICE VISIT.   HISTORY:  This is a very nice 70 year old white female with moderate  obesity complicated by hyperlipidemia, hypertension with tendency to  palpitations.  She had one episode of dizziness several weeks ago that  lasted somewhere between 5 seconds and 5 minutes, immediately better  after she laid down, it was not related to taking her medications and  occurred before she took her morning medications.  She denied any  palpitations, diaphoresis, diplopia, other neurologic complaints, chest  pain or the sensation of orthostasis, orthostatic hypotension prior to  the event.   She admits she has not been physically very active this summer because  she has been taking care of her grandchildren.  However, she denies any  exertional chest pain, orthopnea, PND, TIA or claudications.   PHYSICAL EXAMINATION:  She is a pleasant, ambulatory white female, a  mildly obese white female, in no acute.  She is afebrile with normal  vital signs.  Weight is up 6 pounds to 159, blood pressure 134/86 after  taking her morning medicines, along with a pulse of 60.  HEENT:  Unremarkable.  OROPHARYNX:  Clear.  NECK:  Supple without cervical adenopathy or tenderness.  TRACHEA:  Midline, no thyromegaly.  LUNG FIELDS:  Completely clear bilaterally to auscultation and  percussion.  HEART:  Regular rate and rhythm without murmur, gallop or rub.  ABDOMEN:  Soft, benign.  EXTREMITIES:  Warm without any calf tenderness, cyanosis, clubbing or  edema.   IMPRESSION:  Extended discussion with this patient lasting 15-20 minutes  of a 25 minute visit on the following topics:  1. Benign episode of dizziness, occurred several weeks ago.   It was      not associated with diaphoresis, palpitations, chest discomfort or      obvious neurologic complaints and did not occur directly related to      taking medications with orthostasis.  I believe she is adequately      beta-blocked in terms of her blood pressure, which also controls      her palpitations which have not recurred.  However, should her      dizzy spells recur, she certainly has found that lying down helps      them.  If they are recurring with any kind of a pattern we can      certainly consider a 24 hour Holter.  2. Hyperlipidemia.  Her target LDL is less than 130 because she has      hypertension and a family history.  Will repeat her lipid profile      on Crestor today.  3. Ear wax retention was reviewed from the chart.  This is resolved.      I have given her the usual recommendations regarding adequate ear      hygiene.  4. Osteoporosis.  See most recent bone densitometry in our records      from April 21st of this year.  She has declined taking      bisphosphonates or Forteo.  Will check a Vitamin D level      today to see if she might benefit from more pharmacologic doses of      Vitamin D than she presently takes in the combination of Os-Cal      with D plus her Centrum A-Z.   FOLLOWUP:  Will be every 3 months, sooner if needed.     Charlaine Dalton. Sherene Sires, MD, [facility]  Electronically Signed    MBW/MedQ  DD: 02/15/2007  DT: 02/15/2007  Job #: [id number]

## 2010-12-10 NOTE — Assessment & Plan Note (Signed)
[facility]                             PULMONARY OFFICE NOTE   NAME:[name] [name] CRASS                         MRN:          [id number]  DATE:05/25/2007                            DOB:          [birth year]    [name] [name] is a 70 year old white female with hypertension,  hyperlipidemia and difficulty with weight control, in for follow up  evaluation of these problems, denying any significant dyspnea, but  admitting that she had not been a physically active as she would like to  be.  She has not been able to lose any significant weight, but was able  to get out and walk over the [place] any difficulty or exertional chest pain, orthopnea,  PND or leg swelling.   MEDICATIONS:  All of her medications were reviewed in detail with her in  writing using the medication calendar format that she carries with her,  and it matches up nicely with the medication face sheet dated May 25, 2007 which is correct as recorded.   PHYSICAL EXAMINATION:  GENERAL:  She is a pleasant, ambulatory white  female in no acute distress.  VITAL SIGNS:  Stable.  Blood pressure is 130/78, pulse rate is only 66.  HEENT:  Unremarkable.  Oropharynx clear.  LUNGS:  Clear bilaterally to auscultation.  HEART:  Regular rate and rhythm without murmur, gallop, or rub.  ABDOMEN:  Soft, benign.  EXTREMITIES:  Warm without calf tenderness.  No clubbing, cyanosis, or  edema.   IMPRESSION AND PLAN:  1. Hypertension is well controlled on her present regimen, so I did      not change it.  I did review it in writing in the form of a      medication calendar.  2. Hyperlipidemia.  I reviewed with her the lipid profile from a      previous visit dated February 23, 2007 and noted that she is at target      with normal LFT's, so I did not review this today.  3. Osteoporosis.  The patient declines biphosphonate therapy, and she      does have adequate vitamin  B stores by recent vitamin B levels.  I      therefore recommended plenty of weightbearing exercise and      continued calcium and vitamin B in maximum doses.  I did review      with her the number needed to treat in the biphosphonate      literature to give her a balanced recommendation.  Again, she      declined therapy at this point.  4. Health maintenance.  She was given a flu vaccination today.  5. Symptomatic heartburn and cough.  It seemed to improve while on      acid treatment.  However, of note, she had a normal upper endoscopy      in 2002 with no evidence of a hiatal hernia or secondary      complications of GERD, so I think it is fine to use Prilosec  on a      p.r.n. basis either overt heartburn or increased cough from her      baseline and switch this medicine to a p.r.n. on the medication      calendar so that the instructions she has match up with the      recommendations that I have made.   FOLLOWUP:  She is due a comprehensive evaluation in January 2009, and  will see her sooner if needed.     Charlaine Dalton. Sherene Sires, MD, [facility]  Electronically Signed    MBW/MedQ  DD: 05/25/2007  DT: 05/26/2007  Job #: [id number]

## 2010-12-12 ENCOUNTER — Ambulatory Visit (INDEPENDENT_AMBULATORY_CARE_PROVIDER_SITE_OTHER): Payer: Medicare Other | Admitting: Adult Health

## 2010-12-12 ENCOUNTER — Encounter: Payer: Self-pay | Admitting: Adult Health

## 2010-12-12 DIAGNOSIS — I10 Essential (primary) hypertension: Secondary | ICD-10-CM

## 2010-12-12 DIAGNOSIS — E785 Hyperlipidemia, unspecified: Secondary | ICD-10-CM

## 2010-12-12 DIAGNOSIS — M810 Age-related osteoporosis without current pathological fracture: Secondary | ICD-10-CM

## 2010-12-12 NOTE — Patient Instructions (Signed)
We will set you up for a Bone Density  Continue on same meds.  follow up Dr. Sherene Sires  In 3 months  Follow med calendar closely and bring to each visit.

## 2010-12-12 NOTE — Progress Notes (Signed)
Addended by: Boone Master on: 12/12/2010 10:51 AM   Modules accepted: Orders

## 2010-12-12 NOTE — Assessment & Plan Note (Signed)
Cont on rx  

## 2010-12-12 NOTE — Progress Notes (Signed)
Subjective:    Patient ID: [name] [name], female    DOB: [birth year], 70 y.o.   MRN: [id number]  HPI  5 yowf never smoker with long-standing hyperlipidemia hypertension and a positive family history of heart disease so ldl target < 130  September 27, 2008 concerned VZ:DGLO she reads in the paper about crestor . Having occ pressure in chest has had in past but more freq x sev months not necessarily related to activity, maybe better lying down. Lasts sev minutes, epigastric location, no radiation no nausea or diaphoresis or palpitiations or presyncope. No def worsening of chronic positional neck pain on crestor, nor better off it. resolved on maalox.   Sept 3 2010 to er for rapid heart rate, eval by Eden Emms, no cp or evidence ihd   May 21, 2009 3 month followup of hbp and cholesterol on rx. no change in rx   October 02, 2009 cpx no tia claudication. no cough or sob. rec change to simvostatin and consider reclast   January 30, 2010 3 month followup. no cp, sob, tia, leg swelling or aching muscles or sob.   February 19, 2010--Presents for a work in visit. Compalins of sporadic "weak spells" accompanied with nausea x5 episodes within 1 week.. no change in rx   May 31, 2010 ov c/o spells= weak all over sev x per week lasting a few minutes for up to an hour, sometimes when bends over at waist. checks her own pulse and appears normal during the spells. no problem with activity or presyncope, just weak all over. no amaurosis or viz or localized neuro changes or vertigo.   10/29/2010  cpx new c/o mid back and circ chest discomfort after fell striking both knees and sending a jolt up through her back. Positional. Has not tried nsaids yet>>labs done - at goal. , no changes   12/12/10 Follow and med review  Pt returns for follow up and med review. She was seen last month for CPX. Labs were essentially unremarkable. W/ LDL at goal on Zocor 20mg . We reviewed meds today and organized them into med calendar w/ pt  educaiton.   Leg and knee pain is better. She did not go to ortho.   She has hx of osteoporosis with last BMD 2008 w/ tscore -2.6. She has been intolerant to bisphosnates in past. We discussed she may be candidate for Reclast. Pt info sheet given w/ pt education.       Past Medical History:  HYPERTENSION (ICD-401.9)  BEN HTN HEART DISEASE WITHOUT HEART FAIL (ICD-402.10)  CORONARY HEART DISEASE (ICD-V17.3)  PLANTAR FASCIITIS (ICD-728.71)  COLONIC POLYPS (ICD-211.3)Colon polyps and Diverticulosis........Marland KitchenRussella Dar  -colonoscopy 11/20/2005 for recall 10/2010  OBESITY (ICD-278.00)  PALPITATIONS (ICD-785.1)..................................................................Marland KitchenNishan  ARTHRALGIA UNSPECIFIED SITE (ICD-719.40)  CHEST PAIN (ICD-786.50)  CERVICAL MUSCLE STRAIN (ICD-847.0)  CANDIDIASIS, SKIN (ICD-112.3)  G E R D (ICD-530.81)  UNS ADVRS EFF UNS RX MEDICINAL&BIOLOGICAL SBSTNC (ICD-995.20)  DIVERTICULAR DISEASE (ICD-562.10)  HIATAL HERNIA (ICD-553.3)  UNSPECIFIED HEMORRHOIDS WITH OTHER COMPLICATION (ICD-455.8)  IBS (ICD-564.1)  Hyperlipidemia (ICD-272.4) target LDL less than 130 based on positive family history and hypertension  Back Pain..............................................................................................Marland KitchenRamos  OSTEOPOROSIS (ICD-733.00)  - CT bone densitometry November 16 2006 : PA Spine -2.6 left fem neck -2.9, right fem neck -2.3  RE-DEXA 02/01/09 -2.5 -2.8 -2.5  - intolerant of biphosphonates  G E R D  - see EGD 11/19/06 no gross abn's  HEALTH MAINTENANCE.............................................................Marland KitchenWert  - dt 2/06  - Pneumovax 2/06  - CPX  5//2012   -  GYN [name]  MED CALENDAR 12/12/10       Review of Systems Constitutional:   No  weight loss, night sweats,  Fevers, chills  HEENT:   No headaches,  Difficulty swallowing,  Tooth/dental problems, or  Sore throat,                No sneezing, itching, ear ache, nasal congestion, post nasal  drip,   CV:  No chest pain,  Orthopnea, PND, swelling in lower extremities, anasarca, dizziness, palpitations, syncope.   GI  No heartburn, indigestion, abdominal pain, nausea, vomiting, diarrhea, change in bowel habits, loss of appetite, bloody stools.   Resp: No shortness of breath with exertion or at rest.  No excess mucus, no productive cough,  No non-productive cough,  No coughing up of blood.  No change in color of mucus.  No wheezing.  No chest wall deformity  Skin: no rash or lesions.  GU: no dysuria, change in color of urine, no urgency or frequency.  No flank pain, no hematuria   MS:  No joint pain or swelling.  No decreased range of motion.    Psych:  No change in mood or affect. No depression or anxiety.  No memory loss.          Objective:   Physical Exam GEN: A/Ox3; pleasant , NAD, well nourished   HEENT:  [place]/AT,  EACs-clear, TMs-wnl, NOSE-clear, THROAT-clear, no lesions, no postnasal drip or exudate noted.   NECK:  Supple w/ fair ROM; no JVD; normal carotid impulses w/o bruits; no thyromegaly or nodules palpated; no lymphadenopathy.  RESP  Clear  P & A; w/o, wheezes/ rales/ or rhonchi.no accessory muscle use, no dullness to percussion  CARD:  RRR, no m/r/g  , no peripheral edema, pulses intact, no cyanosis or clubbing.  GI:   Soft & nt; nml bowel sounds; no organomegaly or masses detected.  Musco: Warm bil, no deformities or joint swelling noted.   Neuro: alert, no focal deficits noted.    Skin: Warm, no lesions or rashes           Assessment & Plan:

## 2010-12-12 NOTE — Assessment & Plan Note (Signed)
Set up for BMD Cont on oscal d  Consider reclast if qualifies

## 2010-12-12 NOTE — Assessment & Plan Note (Signed)
Controlled on rx  Labs reivewed.  Patient's medications were reviewed today and patient education was given. Computerized medication calendar was adjusted/completed.

## 2010-12-13 NOTE — Assessment & Plan Note (Signed)
[facility]                             PULMONARY OFFICE NOTE   NAME:[name], [name] DAPPER                         MRN:          [id number]  DATE:08/17/2006                            DOB:          [birth year]    HISTORY OF PRESENT ILLNESS:  The patient is a 70 year old white female  patient of Dr. Thurston Hole who has a known history of hyperlipidemia,  osteoporosis and hypertension that presents for a 1 week followup.  The  patient has brought all of her medication in today for review, which are  correct with her medication list.  The patient was recently seen last  week, at that time had fasting labs which showed a significantly  elevated total cholesterol of 282 and an LDL of 193.  The patient has  previously been intolerant to ADVICOR due to flushing and previously was  not optimally controlled with Pravachol.  The patient had a brief  history with Crestor several years ago and does not remember the reason  why she could not tolerate this.  We have discussed today about starting  Crestor to help to get her LDL goal below 130.  The patient has also  been having a diagnosis of osteoporosis with a negative T score of  negative 3.6 in the PA spine.  The patient has been intolerant to  BISPHOSPHONATES due to GI side effects profile.  We  have discussed  today possibly going on Forte; however, the patient does not wish to  start Forte injections at this time and wishes to discuss this at a  later date.  The patient is currently following up with her orthopedics  for a low back pain and with radicular symptoms.  The patient denies any  chest pain, shortness of breath, abdominal pain, nausea, vomiting,  myalgias or leg swelling.   PAST MEDICAL HISTORY:  Reviewed.   CURRENT MEDICATIONS:  Reviewed.   PHYSICAL EXAMINATION:  The patient is a pleasant female in no acute  distress.  She is afebrile with stable vital signs.  O2 saturation is 97% on room  air.  HEENT:   Conjunctiva clear.  TMs are normal.  Left AC is clear.  Right AC  has a wax cerumen impaction.  NECK:  Is supple without adenopathy.  LUNGS:  Sounds are clear.  CARDIAC:  Regular rate.  ABDOMEN:  Is soft and nontender.  EXTREMITIES:  Are warm without any edema.   IMPRESSION/PLAN:  1. Hyperlipidemia with a target LDL less than 130 with risk factors of      hypertension and positive family history.  The patient will begin      Crestor 10 mg daily along with a low fat, low cholesterol diet.  We      will recheck here in 3 months for fasting lipid panel and her      office visit with Dr. Sherene Sires.  The patient was given samples and a      prescription.  The patient was given statin education.  2. Osteoporosis with intolerance to BISPHOSPHONATE with high risk for  fracture.  Ended conversation with patient education on      osteoporosis and also recommended to be  screened for Forteo      injections.  The patient however wants to hold off at this time and      we will rediscuss it at a later date.  3. Complex medication regimen.  The patient's medications were      reiterated in detail.  Patient education was provided.  A      computerized medication calendar was completed with the patient and      reviewed in detail.  4. A right ear cerumen impaction.  Ear irrigation was completed today      with wax removal without difficulty.  The patient is to use over-      the-counter Debrox as needed.      Rubye Oaks, NP  Electronically Signed      Charlaine Dalton. Sherene Sires, MD, [facility]  Electronically Signed   TP/MedQ  DD: 08/18/2006  DT: 08/18/2006  Job #: [id number]

## 2010-12-13 NOTE — Assessment & Plan Note (Signed)
[facility]                               PULMONARY OFFICE NOTE   NAME:[name] [name], DEZYRE HOEFER                         MRN:          [id number]  DATE:05/12/2006                            DOB:          Mar 26, 1941    This is a 70 year old African-American female with multiple complaints and  concerns.  She has a history of hyperlipidemia with hypertension.  She has a  history of hyperlipidemia with history of hypertension.  She had a positive  family history and therefore trying to maintain an LDL of less than 100.  She is on Advicor 500/20 one q.h.s. tolerating well.  However, her last LDL  was still 195.  She returns today for fasting lipids.  She denies any  exertional chest pain, orthopnea, PND, leg swelling, TIA or claudication  symptoms.   Her other complaint is new onset watery rhinitis.  She does not previously  have a significant history of seasonal allergies.   She denies any nocturnal wheeze, fevers, chills, sweats, cough, or dyspnea,  orthopnea, PND, myalgias, or arthralgias.   For full inventory of medications, please see face sheet dated 05/12/2006.  She is not consistent about taking Citrucel but this is indicated based on  diverticulosis and irritable bowel syndrome by previous workup.  She is also  maintained on Prilosec for a cough that recurred off of Prilosec consistent  with GERD/LPR.   PHYSICAL EXAMINATION:  GENERAL:  She is a pleasant, ambulatory white female  in no acute distress with stable vital signs.  HEENT:  Reveals minimal turbinate VMS/cyanosis.  Oropharynx is clear with no  evidence of excessive postnasal drainage, or cobblestoning.  Dentition is  intact.  NECK:  Supple without cervical adenopathy or tenderness.  Trachea is  midline.  LUNGS:  Lung fields perfectly clear bilaterally to auscultation and  percussion.  No wheezing.  HEART:  Regular rate and rhythm without murmur, gallop, or rub.  ABDOMEN:  Soft, benign.  EXTREMITIES:  Warm without calf tenderness, cyanosis, clubbing or edema.   IMPRESSION:  1. Acute rhinitis nonspecific in nature but may represent mild allergic      component.  Recommended Claritin 10 mg daily p.r.n.  2. Hyperlipidemia with target LDL less than 130. We will repeat lipid      profile and LFTs today looking for adverse drug effect.  She is      tolerating Avicor well so far, and so she is not at target.  We could      certainly try increasing the dose to 2 at bedtime.  3. Hypertension well-controlled on present regimen.  4. General health management.  She was to do influenza today.  She will be      due a DEXA Scan in April 2008 and a comprehensive health evaluation in      January of 2007.   Finally, I reviewed her complex medical regimen with her line-by-line  generating a new cleaned up user friendly version from the computer;  including the addition of Claritin on a p.r.n. basis.  ______________________________  Charlaine Dalton Sherene Sires, MD, [facility]      MBW/MedQ  DD:  05/12/2006  DT:  05/13/2006  Job #:  [id number]

## 2010-12-13 NOTE — Assessment & Plan Note (Signed)
[facility]                               PULMONARY OFFICE NOTE   NAME:[name], [name] MICKELSON                         MRN:          [id number]  DATE:06/08/2006                            DOB:          1941/05/07    HISTORY OF PRESENT ILLNESS:  The patient is a 70 year old white female  patient of Dr. Thurston Hole, who presents for an acute office visit.  The patient  complains of a 3-day history of left-sided lower back pain with radiation  into the left leg.  The patient complains over the last 3 days she has had  progressively worsened left back muscle spasms that are worsened when she  lies down and tries to get up from a sitting or lying position.  The patient  complains that she has tenderness along the left lower back as well.  She  denies any known injury, chest pain, shortness of breath, presyncopal or  syncopal episodes, urinary symptoms, or rash.   PAST MEDICAL HISTORY:  Reviewed.   CURRENT MEDICATIONS:  Reviewed.   PHYSICAL EXAM:  The patient is a pleasant female in no acute distress.  She is afebrile with stable vital signs.  O2 saturation is 97% on room air.  HEENT:  Unremarkable.  Lungs sounds are clear to auscultation.  CARDIAC:  Regular rate and rhythm.  ABDOMEN:  Soft without any hepatosplenomegaly.  No guarding or rebound.  No  abdominal bruits or masses appreciated.  Negative CVA tenderness.  EXTREMITIES:  Warm without any calf tenderness, cyanosis, clubbing, or  edema.  Negative straight-leg raises.  Equal strength of the upper and lower  extremities.  Normal gait.  The patient has tenderness on the left lower  back, especially at the SI joint.   IMPRESSION AND PLAN:  Left lower back pain, suspect sciatica.  The patient  is to begin Advil 600 mg t.i.d. with food x7 days.  Skelaxin 800 mg, #21,  one-half to one 3 times a day as needed.  The patient may use Vicodin 1q. 6  as needed, #10 given.  The patient is to alternate ice and heat.   The  patient is to return if symptoms do not improve or worsen.      Rubye Oaks, NP  Electronically Signed      Charlaine Dalton. Sherene Sires, MD, [facility]  Electronically Signed   TP/MedQ  DD: 06/08/2006  DT: 06/08/2006  Job #: [id number]

## 2010-12-13 NOTE — Assessment & Plan Note (Signed)
[facility]                             PULMONARY OFFICE NOTE   NAME:[name] [name]                         MRN:          [id number]  DATE:11/09/2006                            DOB:          05/06/1941    PRIMARY SERVICE EXTENDED  FOLLOWUP OFFICE VISIT   HISTORY:  This is a 70 year old white female with hyperlipidemia,  hypertension in for followup having been started on Crestor at 10 mg at  bedtime on her previous visit on August 17, 2006 with no significant  aches, pain, GI or claudication symptoms.  She has occasional  palpitations that occur with minimal activity that do not occur with  more rigorous activity and have previously been evaluated in 1992 and  1996 and felt to be benign.  She says they are a lot better than they  used to be and, again, are not associated with any exertional chest  pain, presyncope, syncope, diaphoresis, nausea, etc.   She also has noticed intermittent rectal bleeding that she says, in  retrospect, has been present several times a week for the last several  years but has been worse the last three months.  She states she is  taking Citrucel regularly and has soft stools.   PAST MEDICAL HISTORY:  Significant for the fact that she has  diverticulosis which was evaluated by Dr. [name] in 2007 and that she had  been given instructions on both diverticulosis and hemorrhoids.   MEDICATIONS:  For a full list of her medications, please see patient  sheet of November 09, 2006 which is organized in medication calendar format  and correlates 100% to her present regimen and was documented in column  dated November 09, 2006 but note that she is taking no p.r.n.'s including  no Advil but does take a baby aspirin daily.   PHYSICAL EXAMINATION:  GENERAL APPEARANCE:  She is a pleasant ambulatory  white female in no acute distress.  VITAL SIGNS:  She has stable vital signs with blood pressure 130/50.  HEENT:  Unremarkable.  Oropharynx is  clear. She had minimal residual wax  status post wax removal by our Nurse Practitioner in January.  NECK:  Supple without cervical adenopathy or tenderness.  Trachea is  midline.  There is no thyromegaly.  LUNG:  Fields are completely clear bilaterally to auscultation and  percussion.  HEART:  Regular rate and rhythm without murmurs, rubs or gallops.  ABDOMEN:  Soft, benign.  EXTREMITIES:  Warm without calf tenderness, cyanosis, clubbing or edema.   IMPRESSION:  Extended discussion with this patient lasting 15 to 25  minutes on her visit on the following topics.   PROBLEM #1: HYPERLIPIDEMIA:  Target LDL less than 130 based on the fact  that she has hypertension and positive family history.  Will see if she  reaches that goal on Crestor 10 and also check her liver function tests  for adverse drug effects.  She is going to continue on aspirin daily but  of course, if her bleeding cannot be controlled, it may well be that Dr.  [name] will recommend  that she will need to stop the Ecotrin completely,  which is fine with me, if necessary.   PROBLEM #2:  INTERMITTENT PALPITATIONS:  These date back many years and  actually is better than baseline.  It does not appear to correlate with  activity.  If it continues she can certainly increase the bisoprolol to  b.i.d. dosing and/or consider referral back to cardiology to repeat the  work up that was done in the 90's.  She will let me know if the problem  continues.  I did ask her to be on the lookout for increased symptoms  with exertion, which she repeatedly denied today when asked the question  several different ways.   PROBLEM #3:  INTERMITTENT RECTAL BLEEDING:  This is of concern in this  patient but probably represents hemorrhoids rather than diverticulosis.  Since she is already on Citrucel, but still having bleeding several  times a week, I am going to ask her to see Dr. [name] for further  instructions and recommendations.   PROBLEM #4:  HYPERTENSION:  This is well controlled on her current  regimen so I didn't change this.   PROBLEM #5:  OSTEOPOROSIS:  The patient cannot tolerate biphosphonate's.  Was offered Forteo but declined.  Will need to monitor her regularly  with next bone densitometry due before her next visit.   FOLLOWUP:  We will see her every three months, sooner if needed.     Charlaine Dalton. Sherene Sires, MD, [facility]  Electronically Signed    MBW/MedQ  DD: 11/09/2006  DT: 11/09/2006  Job #: [id number]

## 2010-12-13 NOTE — Assessment & Plan Note (Signed)
[facility]                                 ON-CALL NOTE   DEJANE, SCHEIBE                         MRN:          [id number]  DATE:07/10/2006                            DOB:          September 18, 1940    Patient [name] [name], phone number [phone], July 10, 2006, 10:55 p.m.   The patient is a patient of Dr. Rick Duff, date of birth 1941-07-03.   Patient called stating that she had taken some Advicor tonight, and got  red as a beet.  This medication is a niacin preparation.  The patient  was not having any shortness of breath nor angina or edema.  Just only  had the flushing reaction.  I explained to the patient that this is not  unusual finding with niacin preparations.  The patient understood this.  I did remind her that if she developed any issues with swelling,  hoarseness or dyspnea, to come to the nearest emergency room, or call  back for instructions.  The patient understood these instructions.   IMPRESSION:  Niacin reaction.   RECOMMENDATIONS:  The patient is to seek immediate help if angina or  edema occurs, however, this is not a problem with her at present.  I  also reminded her to discontinue the medication, and to call the office  Monday to discuss with Dr. Sherene Sires as to what she should do for alternative  treatment for her hyperlipidemia.     Gailen Shelter, MD  Electronically Signed    CLG/MedQ  DD: 07/10/2006  DT: 07/11/2006  Job #: [id number]   cc:   Charlaine Dalton. Sherene Sires, MD, FCCP

## 2010-12-13 NOTE — Assessment & Plan Note (Signed)
[facility]                         GASTROENTEROLOGY OFFICE NOTE   NAME:Seidl, MARELLA VANDERPOL                         MRN:          [id number]  DATE:12/02/2006                            DOB:          [birth year]    GASTROENTEROLOGY OFFICE CONSULTATION   REFERRING PHYSICIAN:  Charlaine Dalton. Sherene Sires, M.D.   REASON FOR CONSULTATION:  Small volume hematochezia.   HISTORY OF PRESENT ILLNESS:  Mrs. [name] [name] is a 70 year old white female  who I previously evaluated for a family history of colon cancer.  Her  sister developed colon cancer at age 66.  The patient underwent  colonoscopy on November 20, 2005 which showed small internal hemorrhoids  and sigmoid colon diverticulosis.  Mrs. [name] [name] had problems with  constipation while on pain medications for sciatic nerve pain for  several months.  She states she was using Citrucel and increased fluids  but still had only two bowel movements a week associated with frequent  straining. She has noted several episodes of very small amounts of  bright red blood on the tissue paper after wiping.  She has not noted  any change in stool caliber, abdominal pain or rectal pain.  She does  note a slightly sore sensation in her anal region after wiping with a  washcloth in the shower.   PAST MEDICAL HISTORY:  1. Diverticulosis.  2. Internal hemorrhoids.  3. Hyperlipidemia.  4. Intermittent palpitations.  5. Hypertension.  6. Osteoporosis.  7. Arthritis.  8. Status post cholecystectomy.  9. Status post partial hysterectomy.  10.Status post appendectomy.  11.Gastroesophageal reflux disease.  12.Left lumbar radiculopathy.  13.Prior hyperplastic colon polyps.   CURRENT MEDICATIONS:  Current medications listed on the chart - updated  and reviewed.   MEDICATION ALLERGIES:  None known.   SOCIAL HISTORY:  Per the handwritten form.   REVIEW OF SYSTEMS:  Per the handwritten form.   PHYSICAL EXAMINATION:  GENERAL APPEARANCE: Overweight  white female in no  acute distress.  VITAL SIGNS:  Height 5 feet, 1 inch, weight 158.2 pounds.  Blood  pressure 128/78, pulse 76 and regular.  HEENT:  Exam shows anicteric sclerae.  Oropharynx is clear.  NECK: Without thyromegaly or adenopathy.  CHEST:  Clear to auscultation bilaterally.  CARDIAC:  Regular rate and rhythm without murmurs.  ABDOMEN:  Soft, nontender, nondistended.  Normal active bowel sounds.  No palpable organomegaly, masses or hernias.  RECTAL:  Exam reveals external hemorrhoidal tags, no internal lesions  and Hemoccult negative green-brown stool in the vault.  EXTREMITIES:  Without cyanosis, clubbing or edema.  NEUROLOGICAL:  Alert and oriented x3.  Grossly nonfocal.   ASSESSMENT/PLAN:  1. Small volume hematochezia related to internal and external      hemorrhoids.  Her symptoms were likely exacerbated by constipation      related to pain medications.  She is advised to maintain a high-      fiber diet for long-term management of diverticulosis.  She may      increase her fiber and fluid intake and use a stool softener if      constipating medications are  needed in the future.  She is advised      to use Anusol or Preparation H suppositories daily p.r.n. and      Anusol HC cream b.i.d. p.r.n. for management of hemorrhoids.  She      was given all standard information on rectal care and hemorrhoidal      instructions both verbally and in written form.  She was also      advised to use baby wipes as opposed to wash cloths and standard      toilet paper to avoid irritating her external hemorrhoids.  I will      see her back as needed.  Ongoing followup with Dr. Sherene Sires.  2. Sister with colon cancer.  Recall colonoscopy recommended for      April, 2012.     Venita Lick. [name], MD, [facility]  Electronically Signed    MTS/MedQ  DD: 12/02/2006  DT: 12/02/2006  Job #: [id number]   cc:   Charlaine Dalton. Sherene Sires, MD, FCCP

## 2010-12-13 NOTE — Assessment & Plan Note (Signed)
[facility]                               PULMONARY OFFICE NOTE   NAME:[name] [name], [name] [name]                         MRN:          [id number]  DATE:02/11/2006                            DOB:          [birth year]    HISTORY:  This is a 70 year old, white female who returned for followup  evaluation of hyperlipidemia and hypertension and not fasting today.  She  has been feeling fine, however, with no difficulty following the medication  instruction sheet that was presented to her in calendar format and is  inventoried on our medication face sheet dated February 11, 2006, correct as  listed.   The patient denies any exertional chest pain, orthopnea, PND, myalgias,  arthralgias or excessive flushing on Advicor dosed at bedtime.   PHYSICAL EXAMINATION:  GENERAL:  She is a pleasant, ambulatory, white female  in no acute distress.  VITAL SIGNS:  Stable vital signs with blood pressure 124/72.  HEENT:  Unremarkable.  LUNGS:  Lung fields are completely clear bilaterally to auscultation and  percussion.  HEART:  Regular rate and rhythm without murmurs, rubs or gallops.  ABDOMEN:  Soft.  EXTREMITIES:  Warm without calf tenderness, cyanosis or clubbing.   Lab studies reviewed with the patient from November 12, 2005, indicate that her  LDL then was 155 with normal LFTs with a target LDL of less than 130 based  on two risk factors of hypertension and family history.   IMPRESSION:  1.  Hyperlipidemia, not at goal by most recent study and therefore, in for      followup of fasting lipids.  She did not fast today, however, and I have      asked her to return for this purpose.  I will also need to see her back      for comprehensive health care evaluation in January 2008.  2.  Hypertension, well-controlled on present regimen which I have asked her      to continue consisting of Ziac 5/6.25 mg q.a.m.                                   Charlaine Dalton. Sherene Sires, MD, [facility]   MBW/MedQ  DD:  02/12/2006  DT:  02/12/2006  Job #:  [id number]

## 2010-12-13 NOTE — Assessment & Plan Note (Signed)
[facility]                             PULMONARY OFFICE NOTE   NAME:[name], [name] [name]                         MRN:          [id number]  DATE:08/10/2006                            DOB:          04/15/1941    HISTORY:  A 70 year old white female followed here for hyperlipidemia,  osteoporosis and hypertension, comes in with newly diagnosed with  left  lumbar radiculopathy for which she required injections of cortisone,  biferiens, and subsequently has had trouble with anxiety and  tremulousness.  She also developed a rash that was attributed to Advicor  because it was associated with turning beet red.  Since she has  stopped the Advicor, has not had any more difficulty (note that she did  follow the advice to use aspirin before Advicor, but to no avail).  Presently, she is feeling better in terms of her hip, but more anxious  since receiving the cortisone injections.  However, she denies any  exertional chest pain, orthopnea, PND, leg swelling, and states that her  strength and sensation in the lower extremity is back to normal.   PAST MEDICAL HISTORY:  1. Hypertension.  2. Hyperlipidemia, target LDL less than 130 based on the risk factor      of hypertension and positive family history.  3. Gastroesophageal reflux disease.  See previous endoscopy note,      April 2002.  4. Osteoporosis with inability to tolerate biphosphonates.  5. Status post hysterectomy, cholecystectomy.  6. Degenerative joint disease.  7. Chronic cough, previously felt to be due to gastroesophageal reflux      disease and resolved on Prilosec.  8. History of polyps, status post polypectomy with most recent      colonoscopy April, 2007, in the computer for recall, April 2012.   ALLERGIES:  NONE KNOWN.   MEDICATIONS:  Taken in detail on the work sheet, column dated August 10, 2006.  Correct as listed.   SOCIAL HISTORY:  She has never smoked, she denies any alcohol use.  She  has worked as a housewife, she takes care of her grandchildren.   FAMILY HISTORY:  Positive for MI in her sister at age 51 who had colon  cancer as well.  Negative for any form of breast cancer or diabetes.  Positive for stroke in her mother at age 59, but her father lived to be  40.   REVIEW OF SYSTEMS:  Taken in detail on the worksheet and significant for  the problems as outlined above.   PHYSICAL EXAMINATION:  This is a somewhat anxious white female in acute  distress.  She had afebrile vital signs.  HEENT:  Ocular exam was done with limited funduscopy revealing no  obvious arterial change.  Ear canals revealed wax impaction on the  right, left canal was clear.  Nasal turbinates, normal.  Oropharynx was  clear.  Dentition was intact.  Nose, there is excessive post nasal  drainage or cobblestoning or nasal turbinate edema.  NECK:  Supple without cervical adenopathy or tenderness.  Carotid  upstrokes are brisk without any bruits.  CHEST:  Completely clear bilaterally to auscultation and percussion.  BREASTS:  Without masses, nipple, or skin changes.  Regular rate and rhythm without murmurs, gallops, or rubs, or ectopics.  ABDOMEN:  Soft, benign with no palpable organomegaly, masses, or  tenderness.  Femoral pulses were present bilaterally.  GENITOURINARY:  Rectal per Dr. Richarda Overlie.  EXTREMITIES: Warm without calf tenderness, no cyanosis, clubbing, or  edema.  Pedal pulses were present bilaterally.  NEUROLOGIC:  No focal deficits.  Pathological reflexes including the  lower extremities.  SKIN:  Warm and dry with no lesions.   LABORATORY DATA:  CBC was normal, total cholesterol was 282, with an HDL  of 49, and an LDL of 811.  Chemistry profile was unremarkable.  Urinalysis was unremarkable.  She had a chest x-ray that was normal, and an EKG that was normal.   IMPRESSION:  1. Severe hyperlipidemia that previously was controlled with Advicor,      but had failed Pravachol.   One option would be to start her on      Crestor since it is the most potent of the statins, and avoid      niacin all together given what appears to be significant flushing      that she had from taking the niacin.  The target LDL should be less      than 130, based on the fact that she has hypertension and positive      family history.  2. Hypertension, is well-controlled on her present regimen which I did      not change.  3. Osteoporosis with intolerance to biphosphonates.  If she has      significant height loss, she will need to be considered for Forteo.      In the meantime, I emphasized adequate amounts of vitamin D,      calcium, and dietary calcium, as well as getting back on her feet      with regular weight-bearing exercise once her back problems have      resolved.  4. Complex medical regimen.  She has had multiple medicines started      and stopped by her orthopedic doctor, and also has stopped Advicor      now.  I would like her to return in 1 week for a full medication      reconciliation, and also to have the wax removed from her right      ear.  At that time, we need to consider starting her on Crestor.     Charlaine Dalton. Sherene Sires, MD, [facility]  Electronically Signed    MBW/MedQ  DD: 08/10/2006  DT: 08/11/2006  Job #: [id number]

## 2010-12-17 ENCOUNTER — Other Ambulatory Visit: Payer: Self-pay | Admitting: *Deleted

## 2010-12-17 DIAGNOSIS — E785 Hyperlipidemia, unspecified: Secondary | ICD-10-CM

## 2010-12-18 ENCOUNTER — Other Ambulatory Visit: Payer: Medicare Other

## 2010-12-18 ENCOUNTER — Inpatient Hospital Stay: Admission: RE | Admit: 2010-12-18 | Payer: Medicare Other | Source: Ambulatory Visit

## 2010-12-18 ENCOUNTER — Ambulatory Visit
Admission: RE | Admit: 2010-12-18 | Discharge: 2010-12-18 | Disposition: A | Discharge status: Home / Self Care | Payer: Medicare Other | Source: Ambulatory Visit

## 2010-12-18 DIAGNOSIS — M810 Age-related osteoporosis without current pathological fracture: Secondary | ICD-10-CM

## 2011-02-05 ENCOUNTER — Other Ambulatory Visit: Payer: Self-pay | Admitting: Internal Medicine

## 2011-02-05 ENCOUNTER — Ambulatory Visit (INDEPENDENT_AMBULATORY_CARE_PROVIDER_SITE_OTHER)
Admission: RE | Admit: 2011-02-05 | Discharge: 2011-02-05 | Disposition: A | Discharge status: Home / Self Care | Payer: Medicare Other | Source: Ambulatory Visit

## 2011-02-05 DIAGNOSIS — M810 Age-related osteoporosis without current pathological fracture: Secondary | ICD-10-CM

## 2011-02-07 ENCOUNTER — Encounter: Payer: Self-pay | Admitting: Internal Medicine

## 2011-02-07 NOTE — Progress Notes (Signed)
Quick Note:  Spoke with pt and notified of results per Dr. [name]. Pt verbalized understanding and denied any questions.  ______ 

## 2011-02-19 ENCOUNTER — Telehealth: Payer: Self-pay | Admitting: Adult Health

## 2011-02-19 ENCOUNTER — Encounter: Payer: Self-pay | Admitting: Adult Health

## 2011-02-19 NOTE — Telephone Encounter (Signed)
Error

## 2011-03-12 ENCOUNTER — Encounter: Payer: Self-pay | Admitting: Internal Medicine

## 2011-03-26 ENCOUNTER — Encounter: Payer: Self-pay | Admitting: Internal Medicine

## 2011-03-26 ENCOUNTER — Ambulatory Visit (INDEPENDENT_AMBULATORY_CARE_PROVIDER_SITE_OTHER): Payer: Medicare Other | Admitting: Internal Medicine

## 2011-03-26 ENCOUNTER — Other Ambulatory Visit (INDEPENDENT_AMBULATORY_CARE_PROVIDER_SITE_OTHER): Payer: Medicare Other

## 2011-03-26 VITALS — BP 118/80 | HR 68 | Temp 98.1°F | Ht 61.0 in | Wt 155.2 lb

## 2011-03-26 DIAGNOSIS — I10 Essential (primary) hypertension: Secondary | ICD-10-CM

## 2011-03-26 DIAGNOSIS — E785 Hyperlipidemia, unspecified: Secondary | ICD-10-CM

## 2011-03-26 LAB — HEPATIC FUNCTION PANEL
ALT: 18 U/L (ref 0–35)
Albumin: 4.3 g/dL (ref 3.5–5.2)
Total Bilirubin: 1.5 mg/dL — ABNORMAL HIGH (ref 0.3–1.2)

## 2011-03-26 LAB — LIPID PANEL
HDL: 54.1 mg/dL (ref >39.00)
Total CHOL/HDL Ratio: 4
Triglycerides: 152 mg/dL — ABNORMAL HIGH (ref 0.0–149.0)
VLDL: 30.4 mg/dL (ref 0.0–40.0)

## 2011-03-26 LAB — BASIC METABOLIC PANEL
CO2: 31 mEq/L (ref 19–32)
Glucose, Bld: 116 mg/dL — ABNORMAL HIGH (ref 70–99)
Potassium: 4.4 mEq/L (ref 3.5–5.1)
Sodium: 141 mEq/L (ref 135–145)

## 2011-03-26 NOTE — Assessment & Plan Note (Signed)
Adequate control on present rx, reviewed  

## 2011-03-26 NOTE — Patient Instructions (Addendum)
We will call with your lab results  We will see in April 2013 for your next physical (due on or after 4/3)  Check with Dr Marcelle Overlie re need for exams  Keep appt for Colonoscopy per Dr [name]

## 2011-03-26 NOTE — Progress Notes (Addendum)
Subjective:    Patient ID: [name] [name], female    DOB: 1941/04/06, 70 y.o.   MRN: [id number]  HPI  64 yowf never smoker with long-standing hyperlipidemia hypertension and a positive family history of heart disease so ldl target < 130  September 27, 2008 concerned WN:UUVO she reads in the paper about crestor . Having occ pressure in chest has had in past but more freq x sev months not necessarily related to activity, maybe better lying down. Lasts sev minutes, epigastric location, no radiation no nausea or diaphoresis or palpitiations or presyncope. No def worsening of chronic positional neck pain on crestor, nor better off it. resolved on maalox.   Sept 3 2010 to er for rapid heart rate, eval by Eden Emms, no cp or evidence ihd   May 21, 2009 3 month followup of hbp and cholesterol on rx. no change in rx   October 02, 2009 cpx no tia claudication. no cough or sob. rec change to simvostatin and consider reclast   January 30, 2010 3 month followup. no cp, sob, tia, leg swelling or aching muscles or sob.   February 19, 2010--Presents for a work in visit. Compalins of sporadic "weak spells" accompanied with nausea x5 episodes within 1 week.. no change in rx   May 31, 2010 ov c/o spells= weak all over sev x per week lasting a few minutes for up to an hour, sometimes when bends over at waist. checks her own pulse and appears normal during the spells. no problem with activity or presyncope, just weak all over. no amaurosis or viz or localized neuro changes or vertigo.   10/29/2010  cpx new c/o mid back and circ chest discomfort after fell striking both knees and sending a jolt up through her back. Positional. Has not tried nsaids yet>>labs done - at goal. , no changes   12/12/10 Follow and med review  Pt returns for follow up and med review. She was seen last month for CPX. Labs were essentially unremarkable. W/ LDL at goal on Zocor 20mg . We reviewed meds today and organized them into med calendar w/ pt  educaiton.   Leg and knee pain is better. She did not go to ortho.   She has hx of osteoporosis with last BMD 2008 w/ tscore -2.6. She has been intolerant to bisphosnates in past. We discussed she may be candidate for Reclast. Pt info sheet given w/ pt education.  We will set you up for a Bone Density  Continue on same meds.  follow up Dr. Sherene Sires  In 3 months  Follow med calendar closely and bring to each visit.    03/26/2011 f/u ov/Deanta Mincey cc f/u hbp  lipids, no tia or claudication or aches/ pains  Pt denies any significant sore throat, dysphagia, itching, sneezing,  nasal congestion or excess/ purulent secretions,  fever, chills, sweats, unintended wt loss, pleuritic or exertional cp, hempoptysis, orthopnea pnd or leg swelling.    Also denies any obvious fluctuation of symptoms with weather or environmental changes or other aggravating or alleviating factors.        Past Medical History:  HYPERTENSION (ICD-401.9)  BEN HTN HEART DISEASE WITHOUT HEART FAIL (ICD-402.10)  CORONARY HEART DISEASE (ICD-V17.3)  PLANTAR FASCIITIS (ICD-728.71)  COLONIC POLYPS (ICD-211.3)Colon polyps and Diverticulosis........Marland KitchenRussella Dar  -colonoscopy 11/20/2005 for recall 10/2010  OBESITY (ICD-278.00)  PALPITATIONS (ICD-785.1)..................................................................Marland KitchenNishan  ARTHRALGIA UNSPECIFIED SITE (ICD-719.40)  CHEST PAIN (ICD-786.50)  CERVICAL MUSCLE STRAIN (ICD-847.0)  CANDIDIASIS, SKIN (ICD-112.3)  G E R D (ICD-530.81)  UNS ADVRS  EFF UNS RX MEDICINAL&BIOLOGICAL SBSTNC (ICD-995.20)  DIVERTICULAR DISEASE (ICD-562.10)  HIATAL HERNIA (ICD-553.3)  UNSPECIFIED HEMORRHOIDS WITH OTHER COMPLICATION (ICD-455.8)  IBS (ICD-564.1)  Hyperlipidemia (ICD-272.4) target LDL less than 130 based on positive family history and hypertension  Back Pain..............................................................................................Marland KitchenRamos  OSTEOPOROSIS (ICD-733.00)  - CT bone densitometry  November 16 2006 : PA Spine -2.6 left fem neck -2.9, right fem neck -2.3  RE-DEXA 02/01/09 -2.5 -2.8 -2.5  - intolerant of biphosphonates  G E R D  - see EGD 11/19/06 no gross abn's  HEALTH MAINTENANCE.............................................................Marland KitchenWert  - dt 2/06  - Pneumovax 2/06  - CPX  5//2012   - GYN [name]  MED CALENDAR 12/12/10               Objective:    GENERAL: A/Ox3; pleasant & cooperative.NAD  No orthostasis noted  WT 162 September 27, 2008 > 161 February 06, 2009 >  155 January 30, 2010 >>155 May 31, 2010 .>  03/26/2011   Wt  155 HEENT: [place]/AT, EOM-wnl, PERRLA, EACs-clear, TMs-wnl, NOSE-clear, THROAT-clear & wnl.  NECK: Supple w/ fair ROM; no JVD; normal carotid impulses w/o bruits; no thyromegaly or nodules palpated; no lymphadenopathy.  CHEST: Clear to P & A; w/o, wheezes/ rales/ or rhonchi.  HEART: RRR, no m/r/g heard  ABDOMEN: Soft & nt; nml bowel sounds; no organomegaly or masses detected, no guarding or rebound, neg CVA tenderness  EXT: Warm bilat, no calf pain, edema, clubbing, pulses intact          Assessment & Plan:

## 2011-03-27 NOTE — Progress Notes (Signed)
Quick Note:  Spoke with pt and notified of results per Dr. [name]. Pt verbalized understanding and denied any questions.  ______ 

## 2011-04-11 ENCOUNTER — Encounter: Payer: Self-pay | Admitting: Gastroenterology

## 2011-04-22 ENCOUNTER — Other Ambulatory Visit (HOSPITAL_COMMUNITY): Payer: Self-pay | Admitting: Obstetrics and Gynecology

## 2011-04-22 DIAGNOSIS — Z1231 Encounter for screening mammogram for malignant neoplasm of breast: Secondary | ICD-10-CM

## 2011-04-30 ENCOUNTER — Ambulatory Visit (HOSPITAL_COMMUNITY)
Admission: RE | Admit: 2011-04-30 | Discharge: 2011-04-30 | Disposition: A | Discharge status: Home / Self Care | Payer: Medicare Other | Source: Ambulatory Visit | Attending: Obstetrics and Gynecology | Admitting: Obstetrics and Gynecology

## 2011-04-30 DIAGNOSIS — Z1231 Encounter for screening mammogram for malignant neoplasm of breast: Secondary | ICD-10-CM

## 2011-05-08 ENCOUNTER — Encounter: Payer: Self-pay | Admitting: Gastroenterology

## 2011-05-08 ENCOUNTER — Ambulatory Visit (INDEPENDENT_AMBULATORY_CARE_PROVIDER_SITE_OTHER): Payer: Medicare Other | Admitting: Gastroenterology

## 2011-05-08 VITALS — BP 144/82 | HR 80 | Ht 60.0 in | Wt 154.2 lb

## 2011-05-08 DIAGNOSIS — Z800 Family history of malignant neoplasm of digestive organs: Secondary | ICD-10-CM

## 2011-05-08 DIAGNOSIS — K219 Gastro-esophageal reflux disease without esophagitis: Secondary | ICD-10-CM

## 2011-05-08 MED ORDER — PEG-KCL-NACL-NASULF-NA ASC-C 100 G PO SOLR: 1.0000 | Freq: Once | ORAL | Status: DC

## 2011-05-08 NOTE — Patient Instructions (Signed)
You have been scheduled for a Upper Endoscopy/ Colonoscopy. See separate instructions.  Pick up your prep kit from your pharmacy.  Patient advised to avoid spicy, acidic, citrus, chocolate, mints, fruit and fruit juices.  Limit the intake of caffeine, alcohol and Soda.  Don't exercise too soon after eating.  Don't lie down within 3-4 hours of eating.  Elevate the head of your bed. Increase your Omeprazole 20 mg tablets to twice daily dosing.  cc: Sandrea Hughs, MD

## 2011-05-08 NOTE — Progress Notes (Signed)
History of Present Illness: This is a 70 year old female here today complaining of worsening problems with acid reflux. She is generally taking Prilosec once daily with very good control of her reflux symptoms she decreased it to every other day and did well for a while however over the summer she developed worsening problems with frequent breakthrough symptoms and return to taking Prilosec once daily however she still has breakthrough symptoms several days each week. She denies nocturnal symptoms. Denies weight loss, abdominal pain, constipation, diarrhea, change in stool caliber, melena, hematochezia, nausea, vomiting, dysphagia, chest pain.  Review of Systems: Pertinent positive and negative review of systems were noted in the above HPI section. All other review of systems were otherwise negative.  Current Medications, Allergies, Past Medical History, Past Surgical History, Family History and Social History were reviewed in [employer] record.  Physical Exam: General: Well developed , well nourished, no acute distress Head: Normocephalic and atraumatic Eyes:  sclerae anicteric, EOMI Ears: Normal auditory acuity Mouth: No deformity or lesions Neck: Supple, no masses or thyromegaly Lungs: Clear throughout to auscultation Heart: Regular rate and rhythm; no murmurs, rubs or bruits Abdomen: Soft, non tender and non distended. No masses, hepatosplenomegaly or hernias noted. Normal Bowel sounds Rectal: Deferred to colonoscopy Musculoskeletal: Symmetrical with no gross deformities  Skin: No lesions on visible extremities Pulses:  Normal pulses noted Extremities: No clubbing, cyanosis, edema or deformities noted Neurological: Alert oriented x 4, grossly nonfocal Cervical Nodes:  No significant cervical adenopathy Inguinal Nodes: No significant inguinal adenopathy Psychological:  Alert and cooperative. Normal mood and affect  Assessment and Recommendations:  1. GERD.  Worsening symptoms with no clear cause. Intensify antireflux measures. Increase omeprazole to 20 mg twice daily taken before meals. Endoscopy in 2002 did not show erosive esophagitis or Barrett's. Schedule upper endoscopy. The risks, benefits, and alternatives to endoscopy with possible biopsy and possible dilation were discussed with the patient and they consent to proceed.   2. Family history of colon cancer in her sister. She is due for your recall colonoscopy. The risks, benefits, and alternatives to colonoscopy with possible biopsy and possible polypectomy were discussed with the patient and they consent to proceed.

## 2011-05-09 ENCOUNTER — Telehealth: Payer: Self-pay | Admitting: Gastroenterology

## 2011-05-09 NOTE — Telephone Encounter (Signed)
Pt states she was wondering if we sent her prescription for the prep to her pharmacy yet. I told her we sent it to her mail order pharmacy instead of her local pharmacy at [facility]. Pt states that's fine and that she actually preferred that so she doesn't have to pick it up.

## 2011-05-15 ENCOUNTER — Telehealth: Payer: Self-pay | Admitting: Critical Care Medicine

## 2011-05-15 NOTE — Telephone Encounter (Signed)
S: Call from Ms. Furlough reporting new onset of painless vaginal bleeding.  S/P hysterectomy years ago with no h/o of prior vaginal bleeding.  Is on no blood thinning agents other than ecotrin daily. O: The patient denies dizziness/weakness/N/V/D/Abd pain.  She has experienced no CP or SOB.  She has had some episodes of severe coughing.  The patient is sexually active and had recent sexual intercourse but no pain associated with intercourse or with the bleeding. She is not passing clots but states the bleeding looks like a lot.  She has not saturated pads.  She has a h/o of hemorroids but no excessive bleeding from these in the past and she feels certain this bleeding is vaginal.   She called Dr. Sherene Sires as she recently is no longer covered by Dr. Marcelle Overlie her former gynecologist. A: Vaginal bleeding - etiology uncertain P: Reassured patient  Instructed patient to call office at [facility] for appointment or referral to gynecology  Instructed to go to ED if patient begins to pass clots, saturates a menstrual pad x 3 in 1 hour, is weak or dizzy to the point where she feels  she is going to pass out

## 2011-05-16 ENCOUNTER — Telehealth: Payer: Self-pay | Admitting: Internal Medicine

## 2011-05-16 DIAGNOSIS — N939 Abnormal uterine and vaginal bleeding, unspecified: Secondary | ICD-10-CM

## 2011-05-16 MED ORDER — DOXYCYCLINE HYCLATE 100 MG PO TABS: 100.0000 mg | ORAL_TABLET | Freq: Two times a day (BID) | ORAL | Status: DC

## 2011-05-16 NOTE — Telephone Encounter (Signed)
Doxycycline 100 mg twice daily x 7 days then ov if not better  

## 2011-05-16 NOTE — Telephone Encounter (Signed)
Needs referral to gyn asap - should go to the group she used previously even if the doctor has retired

## 2011-05-16 NOTE — Telephone Encounter (Signed)
Called and spoke with pt and she is aware per MW to take the doxycycline 100mg  bid x 7 days and if not better to call for appt.  Pt voiced her understanding of this,

## 2011-05-16 NOTE — Telephone Encounter (Signed)
Called and spoke with pt and she is aware of order sent in for gyn appt for the vaginal bleeding.  Pt also c/o cough with green sputum x 2-3 days.  Chest tightness but feels like the congestion is breaking up some.  Pt using robitussin but not helping.  Requesting abx to take.  MW please advise. Thanks  No Known Allergies

## 2011-05-22 ENCOUNTER — Ambulatory Visit (INDEPENDENT_AMBULATORY_CARE_PROVIDER_SITE_OTHER)
Admission: RE | Admit: 2011-05-22 | Discharge: 2011-05-22 | Disposition: A | Discharge status: Home / Self Care | Payer: Medicare Other | Source: Ambulatory Visit | Attending: Internal Medicine | Admitting: Internal Medicine

## 2011-05-22 ENCOUNTER — Encounter: Payer: Self-pay | Admitting: Internal Medicine

## 2011-05-22 ENCOUNTER — Ambulatory Visit (INDEPENDENT_AMBULATORY_CARE_PROVIDER_SITE_OTHER): Payer: Medicare Other | Admitting: Internal Medicine

## 2011-05-22 VITALS — BP 128/68 | HR 77 | Temp 98.3°F | Ht 61.0 in | Wt 154.0 lb

## 2011-05-22 DIAGNOSIS — R05 Cough: Secondary | ICD-10-CM

## 2011-05-22 MED ORDER — TRAMADOL HCL 50 MG PO TABS: ORAL_TABLET | ORAL | Status: AC

## 2011-05-22 MED ORDER — PREDNISONE (PAK) 10 MG PO TABS: ORAL_TABLET | ORAL | Status: DC

## 2011-05-22 NOTE — Assessment & Plan Note (Signed)
May have pneumonia in [place] and if so refractory to doxy ddx includes asp/ boop or organizing pna > try change to levaquin x 5 days and return for f/u cxr    Each maintenance medication was reviewed in detail including most importantly the difference between maintenance and as needed and under what circumstances the prns are to be used.  This was done in the context of a medication calendar review which provided the patient with a user-friendly unambiguous mechanism for medication administration and reconciliation and provides an action plan for all active problems. It is critical that this be shown to every doctor  for modification during the office visit if necessary so the patient can use it as a working document.

## 2011-05-22 NOTE — Progress Notes (Signed)
Subjective:    Patient ID: [name] [name], female    DOB: [birth year], 70 y.o.   MRN: [id number]  HPI  12 yowf never smoker with long-standing hyperlipidemia hypertension and a positive family history of heart disease so ldl target < 130   September 27, 2008 concerned GE:XBMW she reads in the paper about crestor . Having occ pressure in chest has had in past but more freq x sev months not necessarily related to activity, maybe better lying down. Lasts sev minutes, epigastric location, no radiation no nausea or diaphoresis or palpitiations or presyncope. No def worsening of chronic positional neck pain on crestor, nor better off it. resolved on maalox.   Sept 3 2010 to er for rapid heart rate, eval by Eden Emms, no cp or evidence ihd   May 21, 2009 3 month followup of hbp and cholesterol on rx. no change in rx   October 02, 2009 cpx no tia claudication. no cough or sob. rec change to simvostatin and consider reclast   January 30, 2010 3 month followup. no cp, sob, tia, leg swelling or aching muscles or sob.   February 19, 2010--Presents for a work in visit. Compalins of sporadic "weak spells" accompanied with nausea x5 episodes within 1 week.. no change in rx   May 31, 2010 ov c/o spells= weak all over sev x per week lasting a few minutes for up to an hour, sometimes when bends over at waist. checks her own pulse and appears normal during the spells. no problem with activity or presyncope, just weak all over. no amaurosis or viz or localized neuro changes or vertigo.   10/29/2010  cpx new c/o mid back and circ chest discomfort after fell striking both knees and sending a jolt up through her back. Positional. Has not tried nsaids yet>>labs done - at goal. , no changes   12/12/10 Follow and med review  Pt returns for follow up and med review. She was seen last month for CPX. Labs were essentially unremarkable. W/ LDL at goal on Zocor 20mg . We reviewed meds today and organized them into med calendar w/ pt  educaiton.   Leg and knee pain is better. She did not go to ortho.   She has hx of osteoporosis with last BMD 2008 w/ tscore -2.6. She has been intolerant to bisphosnates in past. We discussed she may be candidate for Reclast. Pt info sheet given w/ pt education.  We will set you up for a Bone Density  Continue on same meds.  follow up Dr. Sherene Sires  In 3 months  Follow med calendar closely and bring to each visit.     05/22/2011 f/u ov/Greydon Betke cc cough x 2 weeks still with discolored sputum and sense of chest congestion on day 6 of doxy called in.  No previous h/o pna or asthma but does tend to cough with gerd, now on bid ppi.  Pt denies any significant sore throat, dysphagia, itching, sneezing,  nasal congestion ,  fever, chills, sweats, unintended wt loss, pleuritic or exertional cp, hempoptysis, orthopnea pnd or leg swelling.    Also denies any obvious fluctuation of symptoms with weather or environmental changes or other aggravating or alleviating factors.        Past Medical History:  HYPERTENSION (ICD-401.9)  BEN HTN HEART DISEASE WITHOUT HEART FAIL (ICD-402.10)  CORONARY HEART DISEASE (ICD-V17.3)  PLANTAR FASCIITIS (ICD-728.71)  COLONIC POLYPS (ICD-211.3)Colon polyps and Diverticulosis........Marland KitchenRussella Dar  -colonoscopy 11/20/2005 for recall 10/2010  OBESITY (ICD-278.00)  PALPITATIONS (ICD-785.1)..................................................................Marland KitchenSara Lee  ARTHRALGIA UNSPECIFIED SITE (ICD-719.40)  CHEST PAIN (ICD-786.50)  CERVICAL MUSCLE STRAIN (ICD-847.0)  CANDIDIASIS, SKIN (ICD-112.3)  G E R D (ICD-530.81)  UNS ADVRS EFF UNS RX MEDICINAL&BIOLOGICAL SBSTNC (ICD-995.20)  DIVERTICULAR DISEASE (ICD-562.10)  HIATAL HERNIA (ICD-553.3)  UNSPECIFIED HEMORRHOIDS WITH OTHER COMPLICATION (ICD-455.8)  IBS (ICD-564.1)  Hyperlipidemia (ICD-272.4) target LDL less than 130 based on positive family history and hypertension  Back  Pain..............................................................................................Marland KitchenRamos  OSTEOPOROSIS (ICD-733.00)  - CT bone densitometry November 16 2006 : PA Spine -2.6 left fem neck -2.9, right fem neck -2.3  RE-DEXA 02/01/09 -2.5 -2.8 -2.5  - intolerant of biphosphonates  G E R D  - see EGD 11/19/06 no gross abn's  HEALTH MAINTENANCE.............................................................Marland KitchenWert  - dt 2/06  - Pneumovax 08/2004 - CPX  5//2012   - GYN [name]  MED CALENDAR 12/12/10               Objective:    GENERAL: A/Ox3; pleasant & cooperative.NAD  No orthostasis noted  WT 162 September 27, 2008 >   >>155 May 31, 2010 .>  03/26/2011   Wt  155 > 154 05/22/2011  HEENT: [place]/AT, EOM-wnl, PERRLA, EACs-clear, TMs-wnl, NOSE-clear, THROAT-clear & wnl.  NECK: Supple w/ fair ROM; no JVD; normal carotid impulses w/o bruits; no thyromegaly or nodules palpated; no lymphadenopathy.  CHEST: insp and exp rhonchi L > R base HEART: RRR, no m/r/g heard  ABDOMEN: Soft & nt; nml bowel sounds; no organomegaly or masses detected, no guarding or rebound, neg CVA tenderness  EXT: Warm bilat, no calf pain, edema, clubbing, pulses intact      CXR  05/22/2011 :  Mild left lower lobe opacity - pneumonia is not excluded. Radiographic follow up to resolution is recommended.       Assessment & Plan:

## 2011-05-22 NOTE — Patient Instructions (Signed)
Prednisone 10 mg take  4 each am x 2 days,   2 each am x 2 days,  1 each am x2days and stop   Finish the doxycycline and call 1st of week if the sputum is still discolored  Take delsym two tsp every 12 hours and supplement if needed with  tramadol 50 mg up to 2 every 4 hours to suppress the urge to cough. Swallowing water or using ice chips/non mint and menthol containing candies (such as lifesavers or sugarless jolly ranchers) are also effective.  You should rest your voice and avoid activities that you know make you cough.  Once you have eliminated the cough for 3 straight days try reducing the tramadol first,  then the delsym as tolerated.    Please remember to go to the  x-ray department downstairs for your tests - we will call you with the results when then are available.

## 2011-05-23 ENCOUNTER — Telehealth: Payer: Self-pay | Admitting: Internal Medicine

## 2011-05-23 DIAGNOSIS — J189 Pneumonia, unspecified organism: Secondary | ICD-10-CM

## 2011-05-23 MED ORDER — LEVOFLOXACIN 750 MG PO TABS: 750.0000 mg | ORAL_TABLET | Freq: Every day | ORAL | Status: DC

## 2011-05-23 NOTE — Progress Notes (Signed)
Quick Note:  Spoke with pt and notified of results per Dr. [name]. Pt verbalized understanding and denied any questions.  ______ 

## 2011-05-23 NOTE — Telephone Encounter (Signed)
Spoke with pt and notified of results per Dr. Sherene Sires. Pt verbalized understanding and denied any questions. Rx was sent to pharmacy and ov with cxr sched for 05/28/11 at 1: 30 and order for cxr already placed.

## 2011-05-28 ENCOUNTER — Ambulatory Visit (INDEPENDENT_AMBULATORY_CARE_PROVIDER_SITE_OTHER)
Admission: RE | Admit: 2011-05-28 | Discharge: 2011-05-28 | Disposition: A | Discharge status: Home / Self Care | Payer: Medicare Other | Source: Ambulatory Visit | Attending: Internal Medicine | Admitting: Internal Medicine

## 2011-05-28 ENCOUNTER — Encounter: Payer: Self-pay | Admitting: Internal Medicine

## 2011-05-28 ENCOUNTER — Ambulatory Visit (INDEPENDENT_AMBULATORY_CARE_PROVIDER_SITE_OTHER): Payer: Medicare Other | Admitting: Internal Medicine

## 2011-05-28 VITALS — BP 110/70 | HR 67 | Temp 97.0°F | Ht 61.0 in | Wt 152.0 lb

## 2011-05-28 DIAGNOSIS — Z23 Encounter for immunization: Secondary | ICD-10-CM

## 2011-05-28 DIAGNOSIS — R05 Cough: Secondary | ICD-10-CM

## 2011-05-28 DIAGNOSIS — J189 Pneumonia, unspecified organism: Secondary | ICD-10-CM

## 2011-05-28 NOTE — Patient Instructions (Signed)
You are cleared for your colonoscopy and endoscopy  Please schedule a follow up office visit in 4 weeks, sooner if needed with follow up cxr on return

## 2011-05-28 NOTE — Progress Notes (Signed)
Subjective:    Patient ID: [name] [name], female    DOB: [birth year], 70 y.o.   MRN: [id number]  HPI  73 yowf never smoker with long-standing hyperlipidemia hypertension and a positive family history of heart disease so ldl target < 130   September 27, 2008 concerned WU:JWJX she reads in the paper about crestor . Having occ pressure in chest has had in past but more freq x sev months not necessarily related to activity, maybe better lying down. Lasts sev minutes, epigastric location, no radiation no nausea or diaphoresis or palpitiations or presyncope. No def worsening of chronic positional neck pain on crestor, nor better off it. resolved on maalox.   Sept 3 2010 to er for rapid heart rate, eval by Eden Emms, no cp or evidence ihd   May 21, 2009 3 month followup of hbp and cholesterol on rx. no change in rx   October 02, 2009 cpx no tia claudication. no cough or sob. rec change to simvostatin and consider reclast   January 30, 2010 3 month followup. no cp, sob, tia, leg swelling or aching muscles or sob.   February 19, 2010--Presents for a work in visit. Compalins of sporadic "weak spells" accompanied with nausea x5 episodes within 1 week.. no change in rx   May 31, 2010 ov c/o spells= weak all over sev x per week lasting a few minutes for up to an hour, sometimes when bends over at waist. checks her own pulse and appears normal during the spells. no problem with activity or presyncope, just weak all over. no amaurosis or viz or localized neuro changes or vertigo.   10/29/2010  cpx new c/o mid back and circ chest discomfort after fell striking both knees and sending a jolt up through her back. Positional. Has not tried nsaids yet>>labs done - at goal. , no changes   12/12/10 Follow and med review  Pt returns for follow up and med review. She was seen last month for CPX. Labs were essentially unremarkable. W/ LDL at goal on Zocor 20mg . We reviewed meds today and organized them into med calendar w/ pt  educaiton.   Leg and knee pain is better. She did not go to ortho.   She has hx of osteoporosis with last BMD 2008 w/ tscore -2.6. She has been intolerant to bisphosnates in past. We discussed she may be candidate for Reclast. Pt info sheet given w/ pt education.  We will set you up for a Bone Density  Continue on same meds.  follow up Dr. Sherene Sires  In 3 months  Follow med calendar closely and bring to each visit.     05/22/2011 f/u ov/Beronica Lansdale cc cough x 2 weeks still with discolored sputum and sense of chest congestion on day 6 of doxy called in.  No previous h/o pna or asthma but does tend to cough with gerd, now on bid ppi.  rec Prednisone 10 mg take  4 each am x 2 days,   2 each am x 2 days,  1 each am x2days and stop  Finish the doxycycline and call 1st of week if the sputum is still discolored Take delsym two tsp every 12 hours and supplement if needed with  tramadol 50 mg up to 2 every 4 hours   Once you have eliminated the cough for 3 straight days try reducing the tramadol first,  then the delsym as tolerated Levaquin x 5 days     05/28/2011 f/u ov/Daxter Paule cc some better, minimal  yellow sputum now white, no sob or cp.  Sleeping ok without nocturnal  or early am exacerbation  of respiratory  c/o's or need for noct saba. Also denies any obvious fluctuation of symptoms with weather or environmental changes or other aggravating or alleviating factors except as outlined above   Pt denies any significant sore throat, dysphagia, itching, sneezing,  nasal congestion ,  fever, chills, sweats, unintended wt loss, pleuritic or exertional cp, hempoptysis, orthopnea pnd or leg swelling.       Past Medical History:  HYPERTENSION (ICD-401.9)  BEN HTN HEART DISEASE WITHOUT HEART FAIL (ICD-402.10)  CORONARY HEART DISEASE (ICD-V17.3)  PLANTAR FASCIITIS (ICD-728.71)  COLONIC POLYPS (ICD-211.3)Colon polyps and Diverticulosis........Marland KitchenRussella Dar  -colonoscopy 11/20/2005 for recall 10/2010  OBESITY  (ICD-278.00)  PALPITATIONS (ICD-785.1)..................................................................Marland KitchenNishan  ARTHRALGIA UNSPECIFIED SITE (ICD-719.40)  CHEST PAIN (ICD-786.50)  CERVICAL MUSCLE STRAIN (ICD-847.0)  CANDIDIASIS, SKIN (ICD-112.3)  G E R D (ICD-530.81)  UNS ADVRS EFF UNS RX MEDICINAL&BIOLOGICAL SBSTNC (ICD-995.20)  DIVERTICULAR DISEASE (ICD-562.10)  HIATAL HERNIA (ICD-553.3)  UNSPECIFIED HEMORRHOIDS WITH OTHER COMPLICATION (ICD-455.8)  IBS (ICD-564.1)  Hyperlipidemia (ICD-272.4) target LDL less than 130 based on positive family history and hypertension  Back Pain..............................................................................................Marland KitchenRamos  OSTEOPOROSIS (ICD-733.00)  - CT bone densitometry November 16 2006 : PA Spine -2.6 left fem neck -2.9, right fem neck -2.3  RE-DEXA 02/01/09 -2.5 -2.8 -2.5  - intolerant of biphosphonates  G E R D  - see EGD 11/19/06 no gross abn's  HEALTH MAINTENANCE.............................................................Marland KitchenWert  - dt 2/06  - Pneumovax 08/2004 - CPX  5//2012   - GYN [name]  MED CALENDAR 12/12/10               Objective:    GENERAL: A/Ox3; pleasant & cooperative.NAD  No orthostasis noted  WT 162 September 27, 2008 >   >>155 May 31, 2010 .>  03/26/2011   Wt  155 > 154 05/22/2011 > 05/28/2011  152 HEENT: [place]/AT, EOM-wnl, PERRLA, EACs-clear, TMs-wnl, NOSE-clear, THROAT-clear & wnl.  NECK: Supple w/ fair ROM; no JVD; normal carotid impulses w/o bruits; no thyromegaly or nodules palpated; no lymphadenopathy.  CHEST: insp and exp rhonchi L > R base HEART: RRR, no m/r/g heard  ABDOMEN: Soft & nt; nml bowel sounds; no organomegaly or masses detected, no guarding or rebound, neg CVA tenderness  EXT: Warm bilat, no calf pain, edema, clubbing, pulses intact   CXR  05/28/2011 :  Improved left upper lobe perihilar linear density most consistent with atelectasis. Retrocardiac density has not  significantly changed over the last 6 days and may reflect a subtle infiltrate or progressive scarring. Continued follow-up suggested.   .       Assessment & Plan:

## 2011-05-28 NOTE — Assessment & Plan Note (Signed)
Better but not completely - since not smoker will treat conservatively and bring back for cxr in 4weeks  In meantime ok for endo/ colonoscopy

## 2011-06-03 ENCOUNTER — Ambulatory Visit (AMBULATORY_SURGERY_CENTER): Payer: Medicare Other | Admitting: Gastroenterology

## 2011-06-03 ENCOUNTER — Encounter: Payer: Self-pay | Admitting: Gastroenterology

## 2011-06-03 DIAGNOSIS — D131 Benign neoplasm of stomach: Secondary | ICD-10-CM

## 2011-06-03 DIAGNOSIS — K219 Gastro-esophageal reflux disease without esophagitis: Secondary | ICD-10-CM

## 2011-06-03 DIAGNOSIS — Z1211 Encounter for screening for malignant neoplasm of colon: Secondary | ICD-10-CM

## 2011-06-03 DIAGNOSIS — Z800 Family history of malignant neoplasm of digestive organs: Secondary | ICD-10-CM

## 2011-06-03 MED ORDER — SODIUM CHLORIDE 0.9 % IV SOLN: 500.0000 mL | INTRAVENOUS | Status: DC

## 2011-06-03 NOTE — Patient Instructions (Signed)
Please refer to the blue and neon green sheets for instructions regarding diet and activity for the rest of today.  Please refer to the neon green sheet for instructions regarding activity for the rest of today.  Handouts on diverticulosis,hemorrhoids and a hiatal hernia given. Resume previous medications.

## 2011-06-04 ENCOUNTER — Telehealth: Payer: Self-pay | Admitting: *Deleted

## 2011-06-04 NOTE — Telephone Encounter (Signed)

## 2011-06-09 ENCOUNTER — Encounter: Payer: Self-pay | Admitting: Gastroenterology

## 2011-06-26 ENCOUNTER — Ambulatory Visit: Payer: Medicare Other | Admitting: Internal Medicine

## 2011-06-30 ENCOUNTER — Emergency Department (HOSPITAL_COMMUNITY)
Admission: EM | Admit: 2011-06-30 | Discharge: 2011-06-30 | Disposition: A | Discharge status: Home / Self Care | Payer: Medicare Other | Source: Home / Self Care | Attending: Emergency Medicine | Admitting: Emergency Medicine

## 2011-06-30 ENCOUNTER — Encounter (HOSPITAL_COMMUNITY): Payer: Self-pay | Admitting: *Deleted

## 2011-06-30 ENCOUNTER — Telehealth: Payer: Self-pay | Admitting: Internal Medicine

## 2011-06-30 DIAGNOSIS — L0291 Cutaneous abscess, unspecified: Secondary | ICD-10-CM

## 2011-06-30 DIAGNOSIS — L0390 Cellulitis, unspecified: Secondary | ICD-10-CM

## 2011-06-30 LAB — D-DIMER, QUANTITATIVE: D-Dimer, Quant: 0.37 ug/mL-FEU (ref 0.00–0.48)

## 2011-06-30 MED ORDER — CEPHALEXIN 500 MG PO CAPS: 500.0000 mg | ORAL_CAPSULE | Freq: Three times a day (TID) | ORAL | Status: AC

## 2011-06-30 NOTE — ED Notes (Signed)
Pt  Reports  Symptoms  Of l  Lower leg  Pain with  Redness   And  Swelling      Symptoms  X  1a  Few days denys  Any  Injury   The  l lower  Leg is  Swollen   In appearance    denys  Any  Chest pain or  Any  Shortness of  Breath

## 2011-06-30 NOTE — ED Provider Notes (Signed)
History     CSN: [id number] Arrival date & time: 06/30/2011  7:23 PM   First MD Initiated Contact with Patient 06/30/11 1728      Chief Complaint  Patient presents with  . Leg Pain    (Consider location/radiation/quality/duration/timing/severity/associated sxs/prior treatment) HPI Comments: [name] [name] has had a four-day history of redness, warmth, swelling, and pain over her left pretibial surface. She denies any injury. She's not had any fever or chills. No obvious break in the skin. She isn't had any calf pain or pain up higher in the leg or history of thrombophlebitis. She denies any chest pain or shortness of breath.  Patient is a 70 y.o. female presenting with leg pain.  Leg Pain  Pertinent negatives include no numbness.    Past Medical History  Diagnosis Date  . Unspecified essential hypertension   . Benign hypertensive heart disease without heart failure   . Family history of ischemic heart disease   . Plantar fascial fibromatosis   . Obesity, unspecified   . Palpitations   . Pain in joint, site unspecified   . Chest pain, unspecified   . Sprain of neck   . Candidiasis of skin and nails   . Esophageal reflux   . Unspecified adverse effect of unspecified drug, medicinal and biological substance   . Diverticulosis of colon (without mention of hemorrhage)   . Diaphragmatic hernia without mention of obstruction or gangrene   . Unspecified hemorrhoids with other complication   . Irritable bowel syndrome   . Benign neoplasm of colon   . Osteoporosis, unspecified   . Other and unspecified hyperlipidemia   . Gallstones   . Hyperlipidemia     Past Surgical History  Procedure Date  . Vaginal hysterectomy   . Cholecystectomy   . Appendectomy     Family History  Problem Relation Age of Onset  . Heart disease Sister     x 2  . Heart disease Brother   . Stroke Mother   . Colon cancer Sister     History  Substance Use Topics  . Smoking status: Never Smoker   .  Smokeless tobacco: Never Used  . Alcohol Use: No    OB History    Grav Para Term Preterm Abortions TAB SAB Ect Mult Living                  Review of Systems  Musculoskeletal: Positive for arthralgias. Negative for myalgias, back pain, joint swelling and gait problem.  Skin: Negative for rash and wound.  Neurological: Negative for weakness and numbness.    Allergies  Review of patient's allergies indicates no known allergies.  Home Medications   Current Outpatient Rx  Name Route Sig Dispense Refill  . ASPIRIN 81 MG PO TBEC Oral Take 81 mg by mouth daily.      [name] BIOTIN 1000 MCG PO TABS  1 tab by mouth twice daily    . BISOPROLOL-HYDROCHLOROTHIAZIDE 5-6.25 MG PO TABS Oral Take 1 tablet by mouth daily. 90 tablet 3  . CALCIUM CARBONATE-VITAMIN D 500-200 MG-UNIT PO TABS Oral Take 1 tablet by mouth daily as needed.     . CEPHALEXIN 500 MG PO CAPS Oral Take 1 capsule (500 mg total) by mouth 3 (three) times daily. 30 capsule 0  . DEXTROMETHORPHAN POLISTIREX ER 30 MG/5ML PO LQCR  2 tsp every 12 hours as needed for cough     . CENTRUM SILVER PO Oral Take by mouth daily.      [name]  OMEPRAZOLE MAGNESIUM 20 MG PO TBEC  1 tab by mouth in the morning 30 min before meal    . SIMVASTATIN 20 MG PO TABS Oral Take 1 tablet (20 mg total) by mouth at bedtime.      BP 146/64  Pulse 64  Temp(Src) 98.6 F (37 C) (Oral)  Resp 18  SpO2 99%  Physical Exam  Nursing note and vitals reviewed. Constitutional: She is oriented to person, place, and time. She appears well-developed and well-nourished. No distress.  Musculoskeletal: Normal range of motion. She exhibits edema and tenderness.       There is mild erythema and tenderness to palpation over the left pretibial surface. There is no obvious break in the skin. No obvious palpable cord. There was no calf tenderness and Homans sign was negative. No palpable cord up and apply. Pulses were full, joint survey is otherwise unremarkable, normal muscle strength  and sensation were noted.  Neurological: She is alert and oriented to person, place, and time. She has normal strength and normal reflexes. She displays no atrophy. No sensory deficit. She exhibits normal muscle tone.  Skin: Skin is warm and dry. No rash noted. She is not diaphoretic.    ED Course  Procedures (including critical care time)  Results for orders placed during the hospital encounter of 06/30/11  D-DIMER, QUANTITATIVE      Component Value Range   D-Dimer, Quant 0.37  0.00 - 0.48 (ug/mL-FEU)      Labs Reviewed  D-DIMER, QUANTITATIVE   No results found.   1. Cellulitis       MDM  The differential is between cellulitis and superficial thrombophlebitis. The normal d-dimer makes thrombophlebitis less likely. Will treat for cellulitis with elevation, heat, and cephalexin. Suggested that she followup with her primary care doctor in 3 days.        Roque Lias, MD 06/30/11 2223

## 2011-06-30 NOTE — Telephone Encounter (Signed)
Pt stated that she is in pain & wants to speak w/ someone today.  Pt stated she had originally requested an appt for today.  [phone].  Antionette Fairy

## 2011-06-30 NOTE — Telephone Encounter (Signed)
Can see her tomorrow in [place]  Please contact office for sooner follow up if symptoms do not improve or worsen or seek emergency care

## 2011-06-30 NOTE — Telephone Encounter (Signed)
Called and spoke with pt and offered appt with TP in [place]. She states that she will think about it and call us tomorrow if still wants to be seen. She states that she will probably go to UC or ED tonight though, b/c pain is getting worse and does not feel this can wait. I advised that this sounds like the best plan if it's getting worse.

## 2011-06-30 NOTE — Telephone Encounter (Signed)
Called and spoke with pt.  States MW is her PCP.  States she has noticed "for a few days now"  That she has a painful area above her ankle on the front of her leg- shin area.  States it's painful to walk but ok when she sits or rests.  States she noticed redness around site of painful spot last night but states the redness has lessened today.  States sore spot is hot to the touch and there is some swelling.  Denies any injury to her leg/ankle.  Denies f/c/s.  Pt worried about ?cellulitis.  MW out of office this week.  Therefore will forward message to TP to address. Please advise.  Thanks. No Known Allergies

## 2011-07-01 ENCOUNTER — Ambulatory Visit: Payer: Medicare Other | Admitting: Internal Medicine

## 2011-07-04 ENCOUNTER — Ambulatory Visit (INDEPENDENT_AMBULATORY_CARE_PROVIDER_SITE_OTHER)
Admission: RE | Admit: 2011-07-04 | Discharge: 2011-07-04 | Disposition: A | Discharge status: Home / Self Care | Payer: Medicare Other | Source: Ambulatory Visit | Attending: Internal Medicine | Admitting: Internal Medicine

## 2011-07-04 ENCOUNTER — Ambulatory Visit (INDEPENDENT_AMBULATORY_CARE_PROVIDER_SITE_OTHER): Payer: Medicare Other | Admitting: Internal Medicine

## 2011-07-04 ENCOUNTER — Encounter: Payer: Self-pay | Admitting: Internal Medicine

## 2011-07-04 VITALS — BP 118/60 | HR 76 | Temp 98.0°F | Ht 61.0 in | Wt 150.0 lb

## 2011-07-04 DIAGNOSIS — R05 Cough: Secondary | ICD-10-CM

## 2011-07-04 DIAGNOSIS — I10 Essential (primary) hypertension: Secondary | ICD-10-CM

## 2011-07-04 DIAGNOSIS — M7989 Other specified soft tissue disorders: Secondary | ICD-10-CM

## 2011-07-04 NOTE — Patient Instructions (Signed)
GERD (REFLUX)  is an extremely common cause of respiratory symptoms, many times with no significant heartburn at all.    It can be treated with medication, but also with lifestyle changes including avoidance of late meals, excessive alcohol, smoking cessation, and avoid fatty foods, chocolate, peppermint, colas, red wine, and acidic juices such as orange juice.  NO MINT OR MENTHOL PRODUCTS SO NO COUGH DROPS  USE SUGARLESS CANDY INSTEAD (jolley ranchers or Stover's)  NO OIL BASED VITAMINS - use powdered substitutes.    Whenever you start coughing for any reason need to immediately Prilosec 20 mg take 2x 30-60 min before first meal of the day until the cough is gone  Please see patient coordinator before you leave today  to schedule venous dopplers  Please schedule a follow up visit in 3 months but call sooner if needed

## 2011-07-04 NOTE — Progress Notes (Signed)
Subjective:    Patient ID: [name] [name], female    DOB: Dec 18, 1940, 70 y.o.   MRN: [id number]  HPI  58 yowf never smoker with long-standing hyperlipidemia hypertension and a positive family history of heart disease so ldl target < 130   September 27, 2008 concerned GL:OVFI she reads in the paper about crestor . Having occ pressure in chest has had in past but more freq x sev months not necessarily related to activity, maybe better lying down. Lasts sev minutes, epigastric location, no radiation no nausea or diaphoresis or palpitiations or presyncope. No def worsening of chronic positional neck pain on crestor, nor better off it. resolved on maalox.   Sept 3 2010 to er for rapid heart rate, eval by [facility], no cp or evidence ihd   May 21, 2009 3 month followup of hbp and cholesterol on rx. no change in rx   October 02, 2009 cpx no tia claudication. no cough or sob. rec change to simvostatin and consider reclast   January 30, 2010 3 month followup. no cp, sob, tia, leg swelling or aching muscles or sob.   February 19, 2010--Presents for a work in visit. Compalins of sporadic "weak spells" accompanied with nausea x5 episodes within 1 week.. no change in rx   May 31, 2010 ov c/o spells= weak all over sev x per week lasting a few minutes for up to an hour, sometimes when bends over at waist. checks her own pulse and appears normal during the spells. no problem with activity or presyncope, just weak all over. no amaurosis or viz or localized neuro changes or vertigo.   10/29/2010  cpx new c/o mid back and circ chest discomfort after fell striking both knees and sending a jolt up through her back. Positional. Has not tried nsaids yet>>labs done - at goal. , no changes   12/12/10 Follow and med review  Pt returns for follow up and med review. She was seen last month for CPX. Labs were essentially unremarkable. W/ LDL at goal on Zocor 20mg . We reviewed meds today and organized them into med calendar w/ pt  educaiton.   Leg and knee pain is better. She did not go to ortho.   She has hx of osteoporosis with last BMD 2008 w/ tscore -2.6. She has been intolerant to bisphosnates in past. We discussed she may be candidate for Reclast. Pt info sheet given w/ pt education.  We will set you up for a Bone Density  Continue on same meds.  follow up Dr. Sherene Sires  In 3 months  Follow med calendar closely and bring to each visit.     05/22/2011 f/u ov/Nik Gorrell cc cough x 2 weeks still with discolored sputum and sense of chest congestion on day 6 of doxy called in.  No previous h/o pna or asthma but does tend to cough with gerd, now on bid ppi.  rec Prednisone 10 mg take  4 each am x 2 days,   2 each am x 2 days,  1 each am x2days and stop  Finish the doxycycline and call 1st of week if the sputum is still discolored Take delsym two tsp every 12 hours and supplement if needed with  tramadol 50 mg up to 2 every 4 hours   Once you have eliminated the cough for 3 straight days try reducing the tramadol first,  then the delsym as tolerated Levaquin x 5 days     07/04/2011 f/u ov/Rashea Hoskie here for f/u cxr  with minimal residual daytime dry cough but cc L leg swelling ? went to [facility] on 06/30/11- dxed with cellulitis left leg. She states that overall doing much better, still has some mild pain in ankle. Onset was acute on 11/30 minimal fever, no rigors, no injury or cut to L leg x shaves legs repeatedly with same razor  Sleeping ok without nocturnal  or early am exacerbation  of respiratory  c/o's or need for noct saba. Also denies any obvious fluctuation of symptoms with weather or environmental changes or other aggravating or alleviating factors except as outlined above   Pt denies any significant sore throat, dysphagia, itching, sneezing,  nasal congestion ,  fever, chills, sweats, unintended wt loss, pleuritic or exertional cp, hempoptysis, orthopnea pnd or leg swelling.       Past Medical History:  HYPERTENSION  (ICD-401.9)  BEN HTN HEART DISEASE WITHOUT HEART FAIL (ICD-402.10)  CORONARY HEART DISEASE (ICD-V17.3)  PLANTAR FASCIITIS (ICD-728.71)  COLONIC POLYPS (ICD-211.3)Colon polyps and Diverticulosis........Marland KitchenRussella Dar  -colonoscopy 11/20/2005 for recall 10/2010  OBESITY (ICD-278.00)  PALPITATIONS (ICD-785.1)..................................................................Marland KitchenNishan  ARTHRALGIA UNSPECIFIED SITE (ICD-719.40)  CHEST PAIN (ICD-786.50)  CERVICAL MUSCLE STRAIN (ICD-847.0)  CANDIDIASIS, SKIN (ICD-112.3)  G E R D (ICD-530.81)  UNS ADVRS EFF UNS RX MEDICINAL&BIOLOGICAL SBSTNC (ICD-995.20)  DIVERTICULAR DISEASE (ICD-562.10)  HIATAL HERNIA (ICD-553.3)  UNSPECIFIED HEMORRHOIDS WITH OTHER COMPLICATION (ICD-455.8)  IBS (ICD-564.1)  Hyperlipidemia (ICD-272.4) target LDL less than 130 based on positive family history and hypertension  Back Pain..............................................................................................Marland KitchenRamos  OSTEOPOROSIS (ICD-733.00)  - CT bone densitometry November 16 2006 : PA Spine -2.6 left fem neck -2.9, right fem neck -2.3  RE-DEXA 02/01/09 -2.5 -2.8 -2.5  - intolerant of biphosphonates  G E R D  - see EGD 11/19/06 no gross abn's  HEALTH MAINTENANCE.............................................................Marland KitchenWert  - dt 2/06  - Pneumovax 08/2004 - CPX  5//2012   - GYN [name]  MED CALENDAR 12/12/10               Objective:    GENERAL: A/Ox3; pleasant & cooperative.NAD  No orthostasis noted  WT 162 September 27, 2008 >   >>155 May 31, 2010 .>> 154 05/22/2011 > 150 07/04/11 HEENT:  [place]/AT, EOM-wnl, PERRLA, EACs-clear, TMs-wnl, NOSE-clear, THROAT-clear & wnl.  NECK: Supple w/ fair ROM; no JVD; normal carotid impulses w/o bruits; no thyromegaly or nodules palpated; no lymphadenopathy.  CHEST: clear to a and p and no rhonchi HEART: RRR, no m/r/g heard  - minimal swelling L ankle ABDOMEN: Soft & nt; nml bowel sounds; no organomegaly or masses detected,  no guarding or rebound, neg CVA tenderness  EXT: Warm bilat, no calf pain, edema, clubbing, pulses intact SKIN:   Minimal erythema and calor ant L shin   CXR  07/04/2011 :  Cardiomegaly without acute disease, RUL now nl   .   [name]       Assessment & Plan:

## 2011-07-04 NOTE — Progress Notes (Signed)
Quick Note:  Spoke with pt and notified of results per Dr. [name]. Pt verbalized understanding and denied any questions.  ______ 

## 2011-07-05 NOTE — Assessment & Plan Note (Signed)
Improving on keflex c no obvious cuts or risk factors for recurrence x re-uses old razor to shave legs > discussed  Needs venous doppler or L leg to be complete.

## 2011-07-05 NOTE — Assessment & Plan Note (Signed)
Adequate control on present rx, reviewed     Each maintenance medication was reviewed in detail including most importantly the difference between maintenance and as needed and under what circumstances the prns are to be used. This was done in the context of a medication calendar review which provided the patient with a user-friendly unambiguous mechanism for medication administration and reconciliation and provides an action plan for all active problems. It is critical that this be shown to every doctor  for modification during the office visit if necessary so the patient can use it as a working document.     

## 2011-07-05 NOTE — Assessment & Plan Note (Signed)
CXR is clear now so most likely this  Cough is related to the cycle of cough inducing more gerd inducing more cough  Explained natural history of uri/pna and why it's necessary in patients at risk to treat GERD aggressively  at least  short term   to reduce risk of evolving cyclical cough initially  triggered by epithelial injury and a heightened sensitivty to the effects of any upper airway irritants,  most importantly acid - related.  That is, the more sensitive the epithelium damaged for virus, the more the cough, the more the secondary reflux (especially in those prone to reflux) the more the irritation of the sensitive mucosa and so on in a cyclical pattern.

## 2011-07-07 ENCOUNTER — Encounter (INDEPENDENT_AMBULATORY_CARE_PROVIDER_SITE_OTHER): Payer: Medicare Other | Admitting: Cardiology

## 2011-07-07 ENCOUNTER — Encounter: Payer: Self-pay | Admitting: Internal Medicine

## 2011-07-07 ENCOUNTER — Telehealth: Payer: Self-pay | Admitting: Internal Medicine

## 2011-07-07 DIAGNOSIS — L539 Erythematous condition, unspecified: Secondary | ICD-10-CM

## 2011-07-07 DIAGNOSIS — M7989 Other specified soft tissue disorders: Secondary | ICD-10-CM

## 2011-07-07 DIAGNOSIS — M79609 Pain in unspecified limb: Secondary | ICD-10-CM

## 2011-07-07 NOTE — Telephone Encounter (Signed)
Let her know venous dopplers neg for clot

## 2011-07-07 NOTE — Telephone Encounter (Signed)
Spoke with pt and notified of results per Dr. [name]. Pt verbalized understanding and denied any questions. 

## 2011-07-10 NOTE — Progress Notes (Signed)
Quick Note:  Spoke with pt and notified of results per Dr. [name]. Pt verbalized understanding and denied any questions.  ______ 

## 2011-07-15 ENCOUNTER — Ambulatory Visit: Payer: Medicare Other | Admitting: Internal Medicine

## 2011-10-22 ENCOUNTER — Ambulatory Visit (INDEPENDENT_AMBULATORY_CARE_PROVIDER_SITE_OTHER): Payer: Medicare Other | Admitting: Internal Medicine

## 2011-10-22 ENCOUNTER — Encounter: Payer: Self-pay | Admitting: Internal Medicine

## 2011-10-22 VITALS — BP 120/80 | HR 64 | Temp 98.3°F | Ht 61.0 in | Wt 152.2 lb

## 2011-10-22 DIAGNOSIS — R05 Cough: Secondary | ICD-10-CM

## 2011-10-22 DIAGNOSIS — E785 Hyperlipidemia, unspecified: Secondary | ICD-10-CM

## 2011-10-22 DIAGNOSIS — I10 Essential (primary) hypertension: Secondary | ICD-10-CM

## 2011-10-22 DIAGNOSIS — K219 Gastro-esophageal reflux disease without esophagitis: Secondary | ICD-10-CM

## 2011-10-22 MED ORDER — OMEPRAZOLE 40 MG PO CPDR: 40.0000 mg | DELAYED_RELEASE_CAPSULE | Freq: Every day | ORAL | Status: DC

## 2011-10-22 MED ORDER — BISOPROLOL-HYDROCHLOROTHIAZIDE 5-6.25 MG PO TABS: 1.0000 | ORAL_TABLET | Freq: Every day | ORAL | Status: DC

## 2011-10-22 NOTE — Patient Instructions (Addendum)
Change prilosec to 40 mg Take 30-60 min before first meal of the day(sent electronically as was the bisoprolol refill).  Please schedule a follow up visit in 3 months but call sooner if needed for CPX

## 2011-10-22 NOTE — Progress Notes (Signed)
Subjective:    Patient ID: SATSUKI ZILLMER, female    DOB: 1940/08/05   MRN: [id number]  Brief patient profile:  60 yowf never smoker with long-standing hyperlipidemia hypertension and a positive family history of heart disease so ldl target < 130     HPI 12/12/10 Follow and med review  Pt returns for follow up and med review. She was seen last month for CPX. Labs were essentially unremarkable. W/ LDL at goal on Zocor 20mg . We reviewed meds today and organized them into med calendar w/ pt educaiton.   Leg and knee pain is better. She did not go to ortho.   She has hx of osteoporosis with last BMD 2008 w/ tscore -2.6. She has been intolerant to bisphosnates in past. We discussed she may be candidate for Reclast. Pt info sheet given w/ pt education.  We will set you up for a Bone Density  Continue on same meds.  follow up Dr. Sherene Sires  In 3 months  Follow med calendar closely and bring to each visit.     05/22/2011 f/u ov/Ibtisam Benge cc cough x 2 weeks still with discolored sputum and sense of chest congestion on day 6 of doxy called in.  No previous h/o pna or asthma but does tend to cough with gerd, now on bid ppi.  rec Prednisone 10 mg take  4 each am x 2 days,   2 each am x 2 days,  1 each am x2days and stop  Finish the doxycycline and call 1st of week if the sputum is still discolored Take delsym two tsp every 12 hours and supplement if needed with  tramadol 50 mg up to 2 every 4 hours   Once you have eliminated the cough for 3 straight days try reducing the tramadol first,  then the delsym as tolerated Levaquin x 5 days     07/04/2011 f/u ov/Berthe Oley here for f/u cxr with minimal residual daytime dry cough but cc L leg swelling ? went to [facility] on 06/30/11- dxed with cellulitis left leg. She states that overall doing much better, still has some mild pain in ankle. Onset was acute on 11/30 minimal fever, no rigors, no injury or cut to L leg x shaves legs repeatedly with same razor rec GERD  Whenever  you start coughing for any reason need to immediately Prilosec 20 mg take 2x 30-60 min before first meal of the day until the cough is gone Please see patient coordinator before you leave today  to schedule venous dopplers> neg  10/22/2011 f/u ov/Bettylou Frew cc still dry cough with "throat congestion" off and on but in general much better when use 2 prilosec ac  Sleeping ok without nocturnal  or early am exacerbation  of respiratory  c/o's or need for noct saba. Also denies any obvious fluctuation of symptoms with weather or environmental changes or other aggravating or alleviating factors except as outlined above   Pt denies any significant sore throat, dysphagia, itching, sneezing,  nasal congestion ,  fever, chills, sweats, unintended wt loss, pleuritic or exertional cp, hempoptysis, orthopnea pnd or leg swelling.       Past Medical History:  HYPERTENSION (ICD-401.9)  BEN HTN HEART DISEASE WITHOUT HEART FAIL (ICD-402.10)  CORONARY HEART DISEASE (ICD-V17.3)  PLANTAR FASCIITIS (ICD-728.71)  COLONIC POLYPS (ICD-211.3)Colon polyps and Diverticulosis........Marland KitchenRussella Dar  -colonoscopy 11/20/2005 for recall 10/2010  OBESITY (ICD-278.00)  PALPITATIONS (ICD-785.1)..................................................................Marland KitchenNishan  ARTHRALGIA UNSPECIFIED SITE (ICD-719.40)  CHEST PAIN (ICD-786.50)  CERVICAL MUSCLE STRAIN (ICD-847.0)  CANDIDIASIS, SKIN (ICD-112.3)  G E R D (ICD-530.81)  UNS ADVRS EFF UNS RX MEDICINAL&BIOLOGICAL SBSTNC (ICD-995.20)  DIVERTICULAR DISEASE (ICD-562.10)  HIATAL HERNIA (ICD-553.3)  UNSPECIFIED HEMORRHOIDS WITH OTHER COMPLICATION (ICD-455.8)  IBS (ICD-564.1)  Hyperlipidemia (ICD-272.4) target LDL less than 130 based on positive family history and hypertension  Back Pain..............................................................................................Marland KitchenRamos  OSTEOPOROSIS (ICD-733.00)  - CT bone densitometry November 16 2006 : [place] Spine -2.6 left fem neck -2.9, right  fem neck -2.3  RE-DEXA 02/01/09 -2.5 -2.8 -2.5  - intolerant of biphosphonates  G E R D  - see EGD 11/19/06 no gross abn's  HEALTH MAINTENANCE.............................................................Marland KitchenWert  - dt 08/2004 - Pneumovax 08/2004 - CPX  5//2012   - GYN [name]  MED CALENDAR 12/12/10               Objective:    GENERAL: A/Ox3; pleasant & cooperative.NAD  No orthostasis noted  WT 162 September 27, 2008 >   >>155 May 31, 2010 .>> 154 05/22/2011 > 150 07/04/11> 10/22/2011 152 HEENT: [place]/AT, EOM-wnl, PERRLA, EACs-clear, TMs-wnl, NOSE-clear, THROAT-clear & wnl.  NECK: Supple w/ fair ROM; no JVD; normal carotid impulses w/o bruits; no thyromegaly or nodules palpated; no lymphadenopathy.  CHEST: clear to a and p and no rhonchi HEART: RRR, no m/r/g heard  - minimal swelling L ankle ABDOMEN: Soft & nt; nml bowel sounds; no organomegaly or masses detected, no guarding or rebound, neg CVA tenderness  EXT: Warm bilat, no calf pain, edema, clubbing, pulses intact SKIN:   Minimal erythema and calor ant L shin   CXR  07/04/2011 :  Cardiomegaly without acute disease, RUL now nl   .   [name]       Assessment & Plan:

## 2011-10-24 NOTE — Assessment & Plan Note (Signed)
Adequate control on present rx, reviewed  

## 2011-10-24 NOTE — Assessment & Plan Note (Signed)
The most common causes of chronic cough in immunocompetent adults include the following: upper airway cough syndrome (UACS), previously referred to as postnasal drip syndrome (PNDS), which is caused by variety of rhinosinus conditions; (2) asthma; (3) GERD; (4) chronic bronchitis from cigarette smoking or other inhaled environmental irritants; (5) nonasthmatic eosinophilic bronchitis; and (6) bronchiectasis.   These conditions, singly or in combination, have accounted for up to 94% of the causes of chronic cough in prospective studies.   Other conditions have constituted no >6% of the causes in prospective studies These have included bronchogenic carcinoma, chronic interstitial pneumonia, sarcoidosis, left ventricular failure, ACEI-induced cough, and aspiration from a condition associated with pharyngeal dysfunction.   .Chronic cough is often simultaneously caused by more than one condition. A single cause has been found from 38 to 82% of the time, multiple causes from 18 to 62%. Multiply caused cough has been the result of three diseases up to 42% of the time.     Most likley this is  Classic Upper airway cough syndrome, so named because it's frequently impossible to sort out how much is  CR/sinusitis with freq throat clearing (which can be related to primary GERD)   vs  causing  secondary (" extra esophageal")  GERD from wide swings in gastric pressure that occur with throat clearing, often  promoting self use of mint and menthol lozenges that reduce the lower esophageal sphincter tone and exacerbate the problem further in a cyclical fashion.   These are the same pts (now being labeled as having "irritable larynx syndrome" by some cough centers) who not infrequently have a history of having failed to tolerate ace inhibitors,  dry powder inhalers or biphosphonates or report having atypical reflux symptoms that don't respond to standard doses of PPI , and are easily confused as having aecopd or asthma  flares by even experienced allergists/ pulmonologists.   For now max gerd rx and f/u if not improved to her satisfaction

## 2011-10-24 NOTE — Assessment & Plan Note (Signed)
-   target LDL less than 130 based on positive family history and hypertension   Adequate control on present rx, reviewed - continue zocor at present dose    Each maintenance medication was reviewed in detail including most importantly the difference between maintenance and as needed and under what circumstances the prns are to be used. This was done in the context of a medication calendar review which provided the patient with a user-friendly unambiguous mechanism for medication administration and reconciliation and provides an action plan for all active problems. It is critical that this be shown to every doctor  for modification during the office visit if necessary so the patient can use it as a working document.

## 2012-01-20 ENCOUNTER — Encounter: Payer: Medicare Other | Admitting: Internal Medicine

## 2012-02-09 ENCOUNTER — Telehealth: Payer: Self-pay | Admitting: Internal Medicine

## 2012-02-09 DIAGNOSIS — E785 Hyperlipidemia, unspecified: Secondary | ICD-10-CM

## 2012-02-09 MED ORDER — SIMVASTATIN 20 MG PO TABS: 20.0000 mg | ORAL_TABLET | Freq: Every day | ORAL | Status: DC

## 2012-02-09 NOTE — Telephone Encounter (Signed)
Pt requesting refill on zocor 20 mg take 1 Qd #90 mail order rx sent an pt is aware

## 2012-03-03 ENCOUNTER — Encounter: Payer: Self-pay | Admitting: Internal Medicine

## 2012-03-03 ENCOUNTER — Ambulatory Visit (INDEPENDENT_AMBULATORY_CARE_PROVIDER_SITE_OTHER)
Admission: RE | Admit: 2012-03-03 | Discharge: 2012-03-03 | Disposition: A | Discharge status: Home / Self Care | Payer: Medicare Other | Source: Ambulatory Visit | Attending: Internal Medicine | Admitting: Internal Medicine

## 2012-03-03 ENCOUNTER — Ambulatory Visit (INDEPENDENT_AMBULATORY_CARE_PROVIDER_SITE_OTHER): Payer: Medicare Other | Admitting: Internal Medicine

## 2012-03-03 ENCOUNTER — Telehealth: Payer: Self-pay | Admitting: Internal Medicine

## 2012-03-03 ENCOUNTER — Other Ambulatory Visit (INDEPENDENT_AMBULATORY_CARE_PROVIDER_SITE_OTHER): Payer: Medicare Other

## 2012-03-03 VITALS — BP 128/88 | HR 67 | Temp 98.0°F | Ht 60.25 in | Wt 145.4 lb

## 2012-03-03 DIAGNOSIS — R059 Cough, unspecified: Secondary | ICD-10-CM

## 2012-03-03 DIAGNOSIS — D126 Benign neoplasm of colon, unspecified: Secondary | ICD-10-CM

## 2012-03-03 DIAGNOSIS — R05 Cough: Secondary | ICD-10-CM

## 2012-03-03 DIAGNOSIS — K219 Gastro-esophageal reflux disease without esophagitis: Secondary | ICD-10-CM

## 2012-03-03 DIAGNOSIS — I10 Essential (primary) hypertension: Secondary | ICD-10-CM

## 2012-03-03 DIAGNOSIS — H6120 Impacted cerumen, unspecified ear: Secondary | ICD-10-CM

## 2012-03-03 DIAGNOSIS — M810 Age-related osteoporosis without current pathological fracture: Secondary | ICD-10-CM

## 2012-03-03 DIAGNOSIS — E785 Hyperlipidemia, unspecified: Secondary | ICD-10-CM

## 2012-03-03 LAB — BASIC METABOLIC PANEL
BUN: 12 mg/dL (ref 6–23)
CO2: 32 mEq/L (ref 19–32)
Calcium: 9.7 mg/dL (ref 8.4–10.5)
Creatinine, Ser: 0.7 mg/dL (ref 0.4–1.2)
GFR: 81.78 mL/min (ref >60.00)
Glucose, Bld: 104 mg/dL — ABNORMAL HIGH (ref 70–99)
Sodium: 142 mEq/L (ref 135–145)

## 2012-03-03 LAB — URINALYSIS, ROUTINE W REFLEX MICROSCOPIC
Bilirubin Urine: NEGATIVE
Nitrite: NEGATIVE
Total Protein, Urine: NEGATIVE
Urine Glucose: NEGATIVE
Urobilinogen, UA: 0.2 (ref 0.0–1.0)

## 2012-03-03 LAB — HEPATIC FUNCTION PANEL
Albumin: 4.1 g/dL (ref 3.5–5.2)
Alkaline Phosphatase: 40 U/L (ref 39–117)
Total Protein: 7.3 g/dL (ref 6.0–8.3)

## 2012-03-03 LAB — LIPID PANEL
Cholesterol: 195 mg/dL (ref 0–200)
HDL: 57.1 mg/dL (ref >39.00)
Triglycerides: 136 mg/dL (ref 0.0–149.0)
VLDL: 27.2 mg/dL (ref 0.0–40.0)

## 2012-03-03 LAB — CBC WITH DIFFERENTIAL/PLATELET
Basophils Absolute: 0 10*3/uL (ref 0.0–0.1)
Eosinophils Absolute: 0.3 10*3/uL (ref 0.0–0.7)
Lymphocytes Relative: 30.6 % (ref 12.0–46.0)
MCHC: 33.8 g/dL (ref 30.0–36.0)
Monocytes Relative: 7.4 % (ref 3.0–12.0)
Neutro Abs: 3.3 10*3/uL (ref 1.4–7.7)
Platelets: 195 10*3/uL (ref 150.0–400.0)
RDW: 13.3 % (ref 11.5–14.6)

## 2012-03-03 LAB — TSH: TSH: 2.54 u[IU]/mL (ref 0.35–5.50)

## 2012-03-03 NOTE — Assessment & Plan Note (Signed)
-  EGD 06/03/11 Pos HH  rec continue reflux rx, f/u prn   Adequate control on present rx, reviewed rx

## 2012-03-03 NOTE — Assessment & Plan Note (Signed)
CT bone densitometry November 16 2006 : PA Spine -2.6 left fem neck -2.9, right fem neck -2.3  RE -DEXA 02/01/09 -2.5 -2.8 -2.5  RE-DEXA 02/05/11 -2.4, -2.9, - 2.7 - intolerant of biphosphonates  >>reclast discussion >>pt thinking about to review at return OV 02/2011 (if decides yes will need bmet and reclast referral paperwork).   Check Vit D and revisit reclast @ F/u

## 2012-03-03 NOTE — Telephone Encounter (Signed)
Result Note     Call pt: Reviewed cxr and no acute change so no change in recommendations made at ov  --  I spoke with patient about results and she verbalized understanding and had no questions 

## 2012-03-03 NOTE — Patient Instructions (Addendum)
Please remember to go to the lab and x-ray department downstairs for your tests - we will call you with the results when they are available and Tammy can go over the details further when you see her     See Tammy NP w/in 2-3  weeks with all your medications, even over the counter meds, separated in two separate bags, the ones you take no matter what vs the ones you stop once you feel better and take only as needed when you feel you need them.   Tammy  will generate for you a new user friendly medication calendar that will put [place] all on the same page re: your medication use.     Without this process, it simply isn't possible to assure that we are providing  your outpatient care  with  the attention to detail we feel you deserve.   If we cannot assure that you're getting that kind of care,  then we cannot manage your problem effectively from this clinic.  Once you have seen Tammy and we are sure that we're all on the same page with your medication use she will arrange follow up with me.   Tammy will also go over your bloodwork and check your Right ear  In the meantime use ear wax remover = cerumenex kit (over the counter)   Add:  Review reclast also

## 2012-03-03 NOTE — Progress Notes (Signed)
Subjective:    Patient ID: [name] [name], female    DOB: 12/26/1940   MRN: [id number]  Brief patient profile:  1 yowf never smoker with long-standing hyperlipidemia hypertension and a positive family history of heart disease so ldl target < 130     HPI 12/12/10 Follow and med review  Pt returns for follow up and med review. She was seen last month for CPX. Labs were essentially unremarkable. W/ LDL at goal on Zocor 20mg . We reviewed meds today and organized them into med calendar w/ pt educaiton.   Leg and knee pain is better. She did not go to ortho.   She has hx of osteoporosis with last BMD 2008 w/ tscore -2.6. She has been intolerant to bisphosnates in past. We discussed she may be candidate for Reclast. Pt info sheet given w/ pt education.  We will set you up for a Bone Density  Continue on same meds.  follow up Dr. Sherene Sires  In 3 months  Follow med calendar closely and bring to each visit.     05/22/2011 f/u ov/Zykeem Bauserman cc cough x 2 weeks still with discolored sputum and sense of chest congestion on day 6 of doxy called in.  No previous h/o pna or asthma but does tend to cough with gerd, now on bid ppi.  rec Prednisone 10 mg take  4 each am x 2 days,   2 each am x 2 days,  1 each am x2days and stop  Finish the doxycycline and call 1st of week if the sputum is still discolored Take delsym two tsp every 12 hours and supplement if needed with  tramadol 50 mg up to 2 every 4 hours   Once you have eliminated the cough for 3 straight days try reducing the tramadol first,  then the delsym as tolerated Levaquin x 5 days     07/04/2011 f/u ov/Vena Bassinger here for f/u cxr with minimal residual daytime dry cough but cc L leg swelling ? went to [facility] on 06/30/11- dxed with cellulitis left leg. She states that overall doing much better, still has some mild pain in ankle. Onset was acute on 11/30 minimal fever, no rigors, no injury or cut to L leg x shaves legs repeatedly with same razor rec GERD  Whenever  you start coughing for any reason need to immediately Prilosec 20 mg take 2x 30-60 min before first meal of the day until the cough is gone Please see patient coordinator before you leave today  to schedule venous dopplers> neg  10/22/2011 f/u ov/Maudean Hoffmann cc still dry cough with "throat congestion" off and on but in general much better when use 2 prilosec ac rec Change prilosec to 40 mg Take 30-60 min before first meal of the day  03/03/2012 f/u ov/Andriana Casa cc CPX  multiple chronic healthcare problems,  R ear roaring x months, indolent onset, nightly when lies on R side, no pain or hearing problem. No tia, claudication,  Sob or active sinus complaints  Sleeping ok without nocturnal  or early am exacerbation  of respiratory C/o's.  Also denies any obvious fluctuation of symptoms with weather or environmental changes or other aggravating or alleviating factors except as outlined above   ROS  The following are not active complaints unless bolded sore throat, dysphagia, dental problems, itching, sneezing,  nasal congestion or excess/ purulent secretions, ear ache,   fever, chills, sweats, unintended wt loss, pleuritic or exertional cp, hemoptysis,  orthopnea pnd or leg swelling, presyncope, palpitations, heartburn,  abdominal pain, anorexia, nausea, vomiting, diarrhea  or change in bowel or urinary habits, change in stools or urine, dysuria,hematuria,  rash, arthralgias, visual complaints, headache, numbness weakness or ataxia or problems with walking or coordination,  change in mood/affect or memory.          Past Medical History:  HYPERTENSION (ICD-401.9)  Hyperlipidemia (ICD-272.4) target LDL less than 130 based on positive family history and hypertension BEN HTN HEART DISEASE WITHOUT HEART FAIL (ICD-402.10)  CORONARY HEART DISEASE (ICD-V17.3)  PLANTAR FASCIITIS (ICD-728.71)  COLONIC POLYPS (ICD-211.3) and Diverticulosis........Marland KitchenRussella Dar  -colonoscopy 11/20/2005 and 06/03/11 (Diverticula only)   G E R D  (ICD-530.81)  - see EGD 11/19/06 and 06/03/11  Pos HH OBESITY (ICD-278.00)  PALPITATIONS (ICD-785.1)..................................................................Marland KitchenNishan  UNSPECIFIED HEMORRHOIDS WITH OTHER COMPLICATION (ICD-455.8)  IBS (ICD-564.1)  Back Pain..............................................................................................Marland KitchenRamos  OSTEOPOROSIS (ICD-733.00)  - CT bone densitometry November 16 2006 : PA Spine -2.6 left fem neck -2.9, right fem neck -2.3  RE-DEXA 02/01/09 -2.5 -2.8 -2.5  - intolerant of biphosphonates  HEALTH MAINTENANCE.............................................................Marland KitchenWert  - dt 08/2004 - Pneumovax 08/2004 - CPX 03/03/2012   - GYN [name]  - MED CALENDAR 12/12/10        Objective:    GENERAL: A/Ox3; pleasant & cooperative.NAD   WT 162 September 27, 2008 >155 May 31, 2010 .>> 154 05/22/2011 > 10/22/2011 152 > 03/03/2012 145 HEENT: [place]/AT, EOM-wnl, PERRLA, EACs-R ear dense wax impaction, L nl , NOSE-clear, THROAT-clear & wnl.  NECK: Supple w/ fair ROM; no JVD; normal carotid impulses w/o bruits; no thyromegaly or nodules palpated; no lymphadenopathy.  CHEST: clear to a and p and no rhonchi HEART: RRR, no m/r/g heard  -no edema/ sym pulses both feet and carotids/no bruits ABDOMEN: Soft & nt; nml bowel sounds; no organomegaly or masses detected, no guarding or rebound, neg CVA tenderness  EXT: Warm bilat, no calf pain, edema, clubbing, pulses intact SKIN:   No rash or lesion GU/Rectal: [place] Neuro: nl sensorium, no motor or cerbellar deficits MS nl gait, station, no obvious joint restrictions   CXR  03/03/2012 :  Comparison: 07/04/2011  Findings: Stable mild cardiomegaly. Tortuous atheromatous aorta. Lungs clear. No effusion. Regional bones unremarkable.  IMPRESSION:  Stable cardiomegaly     .   [name]       Assessment & Plan:

## 2012-03-03 NOTE — Assessment & Plan Note (Signed)
colonsocopy reviewed with pt from 06/02/12

## 2012-03-03 NOTE — Assessment & Plan Note (Addendum)
-   target LDL less than 130 based on positive family history and hypertension   Adequate control on present rx, reviewed rx with zocor, no changes needed

## 2012-03-03 NOTE — Assessment & Plan Note (Signed)
-   R ear obstructed 03/03/2012   rx cerumenex and f/u 2-3 weeks

## 2012-03-04 NOTE — Progress Notes (Signed)
Quick Note:  Spoke with pt and notified of results per Dr. [name]. Pt verbalized understanding and denied any questions.  ______ 

## 2012-03-24 ENCOUNTER — Encounter: Payer: Self-pay | Admitting: Adult Health

## 2012-03-24 ENCOUNTER — Ambulatory Visit (INDEPENDENT_AMBULATORY_CARE_PROVIDER_SITE_OTHER): Payer: Medicare Other | Admitting: Adult Health

## 2012-03-24 VITALS — BP 136/70 | HR 62 | Temp 98.8°F | Ht 61.0 in | Wt 148.6 lb

## 2012-03-24 DIAGNOSIS — H6120 Impacted cerumen, unspecified ear: Secondary | ICD-10-CM

## 2012-03-24 DIAGNOSIS — I10 Essential (primary) hypertension: Secondary | ICD-10-CM

## 2012-03-24 DIAGNOSIS — M810 Age-related osteoporosis without current pathological fracture: Secondary | ICD-10-CM

## 2012-03-24 DIAGNOSIS — E785 Hyperlipidemia, unspecified: Secondary | ICD-10-CM

## 2012-03-24 NOTE — Patient Instructions (Addendum)
Continue on current regimen  Follow med calendar closely and bring to each visit.  follow up Dr. [name]  In 3 months and As needed   

## 2012-03-24 NOTE — Progress Notes (Signed)
Subjective:    Patient ID: [name] [name], female    DOB: 04/19/1941   MRN: [id number]  Brief patient profile:  43 yowf never smoker with long-standing hyperlipidemia hypertension and a positive family history of heart disease so ldl target < 130     HPI 12/12/10 Follow and med review  Pt returns for follow up and med review. She was seen last month for CPX. Labs were essentially unremarkable. W/ LDL at goal on Zocor 20mg . We reviewed meds today and organized them into med calendar w/ pt educaiton.   Leg and knee pain is better. She did not go to ortho.   She has hx of osteoporosis with last BMD 2008 w/ tscore -2.6. She has been intolerant to bisphosnates in past. We discussed she may be candidate for Reclast. Pt info sheet given w/ pt education.  We will set you up for a Bone Density  Continue on same meds.  follow up Dr. Sherene Sires  In 3 months  Follow med calendar closely and bring to each visit.     05/22/2011 f/u ov/[name] cc cough x 2 weeks still with discolored sputum and sense of chest congestion on day 6 of doxy called in.  No previous h/o pna or asthma but does tend to cough with gerd, now on bid ppi.  rec Prednisone 10 mg take  4 each am x 2 days,   2 each am x 2 days,  1 each am x2days and stop  Finish the doxycycline and call 1st of week if the sputum is still discolored Take delsym two tsp every 12 hours and supplement if needed with  tramadol 50 mg up to 2 every 4 hours   Once you have eliminated the cough for 3 straight days try reducing the tramadol first,  then the delsym as tolerated Levaquin x 5 days     07/04/2011 f/u ov/[name] here for f/u cxr with minimal residual daytime dry cough but cc L leg swelling ? went to [facility] on 06/30/11- dxed with cellulitis left leg. She states that overall doing much better, still has some mild pain in ankle. Onset was acute on 11/30 minimal fever, no rigors, no injury or cut to L leg x shaves legs repeatedly with same razor rec GERD  Whenever  you start coughing for any reason need to immediately Prilosec 20 mg take 2x 30-60 min before first meal of the day until the cough is gone Please see patient coordinator before you leave today  to schedule venous dopplers> neg  10/22/2011 f/u ov/[name] cc still dry cough with "throat congestion" off and on but in general much better when use 2 prilosec ac rec Change prilosec to 40 mg Take 30-60 min before first meal of the day  03/03/2012 f/u ov/[name] cc CPX  multiple chronic healthcare problems,  R ear roaring x months, indolent onset, nightly when lies on R side, no pain or hearing problem. No tia, claudication,  Sob or active sinus complaints >labs , no changes   03/24/12 Follow up  Returns for follow up and med review. 3 weeks ago seen for r CPX. Labs were essentially unremarkable. W/ LDL at goal on Zocor 20mg . We reviewed meds today and organized them into med calendar w/ pt educaiton. Appears to be taking meds correctly .   Has stopped up ear with decreased hearing. Wax impaction in past and noted on exam last ov.  OTC wax solution not working.   She has hx of osteoporosis  with last BMD 2012  w/ max tscore -2.9. She has been intolerant to bisphosnates in past. We discussed several options for her including Reclast . She declines reclast and therapies -wants to remain on Ca and vitam D . Advised on weight bearing exercises as well.     Past Medical History:  HYPERTENSION (ICD-401.9)  Hyperlipidemia (ICD-272.4) target LDL less than 130 based on positive family history and hypertension BEN HTN HEART DISEASE WITHOUT HEART FAIL (ICD-402.10)  CORONARY HEART DISEASE (ICD-V17.3)  PLANTAR FASCIITIS (ICD-728.71)  COLONIC POLYPS (ICD-211.3) and Diverticulosis........Marland KitchenRussella Dar  -colonoscopy 11/20/2005 and 06/03/11 (Diverticula only)   G E R D (ICD-530.81)  - see EGD 11/19/06 and 06/03/11  Pos HH OBESITY (ICD-278.00)  PALPITATIONS  (ICD-785.1)..................................................................Marland KitchenNishan  UNSPECIFIED HEMORRHOIDS WITH OTHER COMPLICATION (ICD-455.8)  IBS (ICD-564.1)  Back Pain..............................................................................................Marland KitchenRamos  OSTEOPOROSIS (ICD-733.00)  - CT bone densitometry November 16 2006 : PA Spine -2.6 left fem neck -2.9, right fem neck -2.3  RE-DEXA 02/01/09 -2.5 -2.8 -2.5  - intolerant of biphosphonates  Dexa 2012 >max tscore -2.9 , declined Reclast  HEALTH MAINTENANCE.............................................................Marland KitchenWert  - dt 08/2004 - Pneumovax 08/2004 - CPX 03/03/2012   - GYN [name]  - MED CALENDAR 12/12/10 , 03/24/12   ROS:  Constitutional:   No  weight loss, night sweats,  Fevers, chills, fatigue, or  lassitude.  HEENT:   No headaches,  Difficulty swallowing,  Tooth/dental problems, or  Sore throat,                No sneezing, itching, ear ache, nasal congestion, post nasal drip,   CV:  No chest pain,  Orthopnea, PND, swelling in lower extremities, anasarca, dizziness, palpitations, syncope.   GI  No heartburn, indigestion, abdominal pain, nausea, vomiting, diarrhea, change in bowel habits, loss of appetite, bloody stools.   Resp: No shortness of breath with exertion or at rest.  No excess mucus, no productive cough,  No non-productive cough,  No coughing up of blood.  No change in color of mucus.  No wheezing.  No chest wall deformity  Skin: no rash or lesions.  GU: no dysuria, change in color of urine, no urgency or frequency.  No flank pain, no hematuria   MS:  No joint pain or swelling.  No decreased range of motion.  No back pain.  Psych:  No change in mood or affect. No depression or anxiety.  No memory loss.           Objective:    GENERAL: A/Ox3; pleasant & cooperative.NAD   WT 162 September 27, 2008 >155 May 31, 2010 .>> 154 05/22/2011 > 10/22/2011 152 > 03/03/2012 145 HEENT: [place]/AT ,  EACs-R ear dense  wax impaction, L nl , NOSE-clear, THROAT-clear & wnl.  NECK: Supple w/ fair ROM; no JVD; normal carotid impulses w/o bruits; no thyromegaly or nodules palpated; no lymphadenopathy.  CHEST: clear to a and p and no rhonchi HEART: RRR, no m/r/g heard  -no edema/ sym pulses both feet and carotids/no bruits ABDOMEN: Soft & nt; nml bowel sounds; no organomegaly or masses detected, no guarding or rebound, neg CVA tenderness  EXT: Warm bilat, no calf pain, edema, clubbing, pulses intact SKIN:   No rash or lesion  Neuro: nl sensorium, no motor or cerbellar deficits MS nl gait, station, no obvious joint restrictions   CXR  03/03/2012 :  Comparison: 07/04/2011  Findings: Stable mild cardiomegaly. Tortuous atheromatous aorta. Lungs clear. No effusion. Regional bones unremarkable.  IMPRESSION:  Stable cardiomegaly     .   [name]  Assessment & Plan:

## 2012-03-30 NOTE — Assessment & Plan Note (Signed)
Ear irrigation -tolerated well  Advised on debrox As needed

## 2012-03-30 NOTE — Assessment & Plan Note (Signed)
At goal  Cont on low fat cholesterol diet

## 2012-03-30 NOTE — Assessment & Plan Note (Signed)
Osteoporosis , w/ tscore -2.9 on dexa 2012 ,  Intolerant to bisphos.  Declines reclast rx  Vit d is nml  Cont on oscal d Twice daily   Wt bearing exericses and good diet encourgaed

## 2012-03-30 NOTE — Assessment & Plan Note (Signed)
Controlled on rx  Patient's medications were reviewed today and patient education was given. Computerized medication calendar was adjusted/completed   Plan Continue on current regimen.  Follow med calendar closely and bring to each visit.  follow up Dr. Sherene Sires  In 3 months and As needed

## 2012-05-04 ENCOUNTER — Other Ambulatory Visit (HOSPITAL_COMMUNITY): Payer: Self-pay | Admitting: Obstetrics and Gynecology

## 2012-05-04 DIAGNOSIS — Z1231 Encounter for screening mammogram for malignant neoplasm of breast: Secondary | ICD-10-CM

## 2012-05-19 ENCOUNTER — Ambulatory Visit (HOSPITAL_COMMUNITY)
Admission: RE | Admit: 2012-05-19 | Discharge: 2012-05-19 | Disposition: A | Discharge status: Home / Self Care | Payer: Medicare Other | Source: Ambulatory Visit | Attending: Obstetrics and Gynecology | Admitting: Obstetrics and Gynecology

## 2012-05-19 DIAGNOSIS — Z1231 Encounter for screening mammogram for malignant neoplasm of breast: Secondary | ICD-10-CM

## 2012-06-23 ENCOUNTER — Encounter: Payer: Self-pay | Admitting: Internal Medicine

## 2012-06-23 ENCOUNTER — Ambulatory Visit (INDEPENDENT_AMBULATORY_CARE_PROVIDER_SITE_OTHER): Payer: Medicare Other | Admitting: Internal Medicine

## 2012-06-23 VITALS — BP 104/70 | HR 60 | Temp 98.0°F | Ht 59.0 in | Wt 148.0 lb

## 2012-06-23 DIAGNOSIS — H6120 Impacted cerumen, unspecified ear: Secondary | ICD-10-CM

## 2012-06-23 DIAGNOSIS — Z23 Encounter for immunization: Secondary | ICD-10-CM

## 2012-06-23 DIAGNOSIS — I10 Essential (primary) hypertension: Secondary | ICD-10-CM

## 2012-06-23 DIAGNOSIS — E785 Hyperlipidemia, unspecified: Secondary | ICD-10-CM

## 2012-06-23 DIAGNOSIS — M810 Age-related osteoporosis without current pathological fracture: Secondary | ICD-10-CM

## 2012-06-23 NOTE — Patient Instructions (Addendum)
See calendar for specific medication instructions and bring it back for each and every office visit for every healthcare provider you see.  Without it,  you may not receive the best quality medical care that we feel you deserve.  You will note that the calendar groups together  your maintenance  medications that are timed at particular times of the day.  Think of this as your checklist for what your doctor has instructed you to do until your next evaluation to see what benefit  there is  to staying on a consistent group of medications intended to keep you well.  The other group at the bottom is entirely up to you to use as you see fit  for specific symptoms that may arise between visits that require you to treat them on an as needed basis.  Think of this as your action plan or "what if" list.   Separating the top medications from the bottom group is fundamental to providing you adequate care going forward.    Please schedule a follow up visit in 6 months, sooner if needed.

## 2012-06-23 NOTE — Progress Notes (Signed)
Subjective:    Patient ID: [name] [name], female    DOB: 18-Sep-1940   MRN: [id number]  Brief patient profile:   56 yowf never smoker with long-standing hyperlipidemia hypertension and a positive family history of heart disease so ldl target < 130     HPI 12/12/10 Follow and med review  Pt returns for follow up and med review. She was seen last month for CPX. Labs were essentially unremarkable. W/ LDL at goal on Zocor 20mg . We reviewed meds today and organized them into med calendar w/ pt educaiton.   Leg and knee pain is better. She did not go to ortho.   She has hx of osteoporosis with last BMD 2008 w/ tscore -2.6. She has been intolerant to bisphosnates in past. We discussed she may be candidate for Reclast. Pt info sheet given w/ pt education.  We will set you up for a Bone Density  Continue on same meds.  follow up Dr. Sherene Sires  In 3 months  Follow med calendar closely and bring to each visit.     05/22/2011 f/u ov/[name] [name] cc cough x 2 weeks still with discolored sputum and sense of chest congestion on day 6 of doxy called in.  No previous h/o pna or asthma but does tend to cough with gerd, now on bid ppi.  rec Prednisone 10 mg take  4 each am x 2 days,   2 each am x 2 days,  1 each am x2days and stop  Finish the doxycycline and call 1st of week if the sputum is still discolored Take delsym two tsp every 12 hours and supplement if needed with  tramadol 50 mg up to 2 every 4 hours   Once you have eliminated the cough for 3 straight days try reducing the tramadol first,  then the delsym as tolerated Levaquin x 5 days     07/04/2011 f/u ov/[name] [name] here for f/u cxr with minimal residual daytime dry cough but cc L leg swelling ? went to [facility] on 06/30/11- dxed with cellulitis left leg. She states that overall doing much better, still has some mild pain in ankle. Onset was acute on 11/30 minimal fever, no rigors, no injury or cut to L leg x shaves legs repeatedly with same razor rec GERD    Whenever you start coughing for any reason need to immediately Prilosec 20 mg take 2x 30-60 min before first meal of the day until the cough is gone Please see patient coordinator before you leave today  to schedule venous dopplers> neg  10/22/2011 f/u ov/[name] [name] cc still dry cough with "throat congestion" off and on but in general much better when use 2 prilosec ac rec Change prilosec to 40 mg Take 30-60 min before first meal of the day  03/03/2012 f/u ov/See [name] [name] cc CPX  multiple chronic healthcare problems,  R ear roaring x months, indolent onset, nightly when lies on R side, no pain or hearing problem. No tia, claudication,  Sob or active sinus complaints >labs , no changes   03/24/12 Follow up / NP Returns for follow up and med review. 3 weeks ago seen for r CPX. Labs were essentially unremarkable. W/ LDL at goal on Zocor 20mg . We reviewed meds today and organized them into med calendar w/ pt educaiton. Appears to be taking meds correctly .   Has stopped up ear with decreased hearing. Wax impaction in past and noted on exam last ov.  OTC wax solution not working.   She has  hx of osteoporosis with last BMD 2012  w/ max tscore -2.9. She has been intolerant to bisphosnates in past. We discussed several options for her including Reclast . She declines reclast and therapies -wants to remain on Ca and vitam D . Advised on weight bearing exercises as well rec Continue on current regimen.  Follow med calendar closely and bring to each visit.  06/23/2012 f/u ov/[name] [name] using calendar well cc f/u chol, bp, osteoporosis.  Not willing to consider reclast, no tia, cp, sob, claudication.  Sleeping ok without nocturnal  or early am exacerbation  of respiratory  c/o's or need for noct saba. Also denies any obvious fluctuation of symptoms with weather or environmental changes or other aggravating or alleviating factors except as outlined above  ROS  The following are not active complaints unless bolded sore  throat, dysphagia, dental problems, itching, sneezing,  nasal congestion or excess/ purulent secretions, ear ache,   fever, chills, sweats, unintended wt loss, pleuritic or exertional cp, hemoptysis,  orthopnea pnd or leg swelling, presyncope, palpitations, heartburn, abdominal pain, anorexia, nausea, vomiting, diarrhea  or change in bowel or urinary habits, change in stools or urine, dysuria,hematuria,  rash, arthralgias, visual complaints, headache, numbness weakness or ataxia or problems with walking or coordination,  change in mood/affect or memory.        Past Medical History:  HYPERTENSION (ICD-401.9)  Hyperlipidemia (ICD-272.4) target LDL less than 130 based on positive family history and hypertension BEN HTN HEART DISEASE WITHOUT HEART FAIL (ICD-402.10)  CORONARY HEART DISEASE (ICD-V17.3)  PLANTAR FASCIITIS (ICD-728.71)  COLONIC POLYPS (ICD-211.3) and Diverticulosis........[name] [name]  -colonoscopy 11/20/2005 and 06/03/11 (Diverticula only)   G E R D (ICD-530.81)  - see EGD 11/19/06 and 06/03/11  Pos HH OBESITY (ICD-278.00)  PALPITATIONS (ICD-785.1)..................................................................[name] [name]  UNSPECIFIED HEMORRHOIDS WITH OTHER COMPLICATION (ICD-455.8)  IBS (ICD-564.1)  Back Pain..............................................................................................[name] [name]  OSTEOPOROSIS (ICD-733.00)  - CT bone densitometry November 16 2006 : PA Spine -2.6 left fem neck -2.9, right fem neck -2.3  RE-DEXA 02/01/09 -2.5 -2.8 -2.5  - intolerant of biphosphonates  Dexa 2012 >max tscore -2.9 , declined Reclast  HEALTH MAINTENANCE.............................................................[name] [name]  - dt 08/2004 - Pneumovax 08/2004 - CPX 03/03/2012   - GYN [name]  - MED CALENDAR 12/12/10 , 03/24/12               Objective:    GENERAL: A/Ox3; pleasant & cooperative.NAD   WT 162 September 27, 2008 >155 May 31, 2010 .>> 154 05/22/2011 > 10/22/2011 152 > 03/03/2012  145> 06/23/2012  148  HEENT: [place]/AT ,  EACs- bilateral clear , NOSE-clear, THROAT-clear & wnl.  NECK: Supple w/ fair ROM; no JVD; normal carotid impulses w/o bruits; no thyromegaly or nodules palpated; no lymphadenopathy.  CHEST: clear to a and p and no rhonchi HEART: RRR, no m/r/g heard  -no edema/ sym pulses both feet and carotids/no bruits ABDOMEN: Soft & nt; nml bowel sounds; no organomegaly or masses detected, no guarding or rebound, neg CVA tenderness  EXT: Warm bilat, no calf pain, edema, clubbing, pulses intact SKIN:   No rash or lesion  Neuro: nl sensorium, no motor or cerbellar deficits MS nl gait, station, no obvious joint restrictions   CXR  03/03/2012 :  Comparison: 07/04/2011  Findings: Stable mild cardiomegaly. Tortuous atheromatous aorta. Lungs clear. No effusion. Regional bones unremarkable.  IMPRESSION:  Stable cardiomegaly     .   [name] Kitchen       Assessment & Plan:

## 2012-06-24 NOTE — Assessment & Plan Note (Signed)
Adequate control on present rx, reviewed  

## 2012-06-24 NOTE — Assessment & Plan Note (Signed)
resolved 

## 2012-06-24 NOTE — Assessment & Plan Note (Signed)
-   CT bone densitometry November 16 2006 : PA Spine -2.6 left fem neck -2.9, right fem neck -2.3  RE -DEXA 02/01/09 -2.5 -2.8 -2.5  RE-DEXA 02/05/11 -2.4, -2.9, - 2.7 - intolerant of biphosphonates  >declined reclast 03/24/12 and 06/23/12  Vit d and calcium intake adequate Discussed in detail all the  indications, usual  risks and alternatives  relative to the benefits with patient who agrees to proceed with conservative rx with wt bearing exercise.

## 2012-06-24 NOTE — Assessment & Plan Note (Signed)
-   target LDL less than 130 based on positive family history and hypertension   Lab Results  Component Value Date   LDLCALC 111* 03/03/2012     Adequate control on present rx, reviewed

## 2012-07-28 LAB — HM PAP SMEAR: HM Pap smear: NORMAL

## 2012-07-28 LAB — HM MAMMOGRAPHY: HM Mammogram: NORMAL

## 2012-10-27 ENCOUNTER — Other Ambulatory Visit: Payer: Self-pay | Admitting: Internal Medicine

## 2012-11-08 ENCOUNTER — Other Ambulatory Visit: Payer: Self-pay | Admitting: Internal Medicine

## 2012-11-10 ENCOUNTER — Telehealth: Payer: Self-pay | Admitting: Internal Medicine

## 2012-11-10 MED ORDER — BISOPROLOL-HYDROCHLOROTHIAZIDE 5-6.25 MG PO TABS: 1.0000 | ORAL_TABLET | Freq: Every day | ORAL | Status: DC

## 2012-11-10 NOTE — Telephone Encounter (Signed)
Spoke to pt. She has not received her medication from [facility]. We sent in the refill on 10/27/12. I offered to send in a 15 day supply to a local pharmacy to hold her over until the medication comes from [facility]. She agreed. This has been sent to [facility].

## 2012-12-29 ENCOUNTER — Ambulatory Visit (INDEPENDENT_AMBULATORY_CARE_PROVIDER_SITE_OTHER): Payer: Medicare Other | Admitting: Internal Medicine

## 2012-12-29 ENCOUNTER — Encounter: Payer: Self-pay | Admitting: Internal Medicine

## 2012-12-29 VITALS — BP 132/80 | HR 61 | Temp 97.0°F | Ht 60.25 in | Wt 156.4 lb

## 2012-12-29 DIAGNOSIS — H6120 Impacted cerumen, unspecified ear: Secondary | ICD-10-CM

## 2012-12-29 DIAGNOSIS — E785 Hyperlipidemia, unspecified: Secondary | ICD-10-CM

## 2012-12-29 DIAGNOSIS — H6123 Impacted cerumen, bilateral: Secondary | ICD-10-CM

## 2012-12-29 DIAGNOSIS — I10 Essential (primary) hypertension: Secondary | ICD-10-CM

## 2012-12-29 DIAGNOSIS — M810 Age-related osteoporosis without current pathological fracture: Secondary | ICD-10-CM

## 2012-12-29 NOTE — Assessment & Plan Note (Signed)
Adequate control on present rx, reviewed  

## 2012-12-29 NOTE — Progress Notes (Signed)
Subjective:    Patient ID: [name] [name], female    DOB: 01-03-1941   MRN: [id number]  Brief patient profile:   58 yowf never smoker with long-standing hyperlipidemia hypertension and a positive family history of heart disease so ldl target < 130     HPI 12/12/10 Follow and med review  Pt returns for follow up and med review. She was seen last month for CPX. Labs were essentially unremarkable. W/ LDL at goal on Zocor 20mg . We reviewed meds today and organized them into med calendar w/ pt educaiton.   Leg and knee pain is better. She did not go to ortho.   She has hx of osteoporosis with last BMD 2008 w/ tscore -2.6. She has been intolerant to bisphosnates in past. We discussed she may be candidate for Reclast. Pt info sheet given w/ pt education.  We will set you up for a Bone Density  Continue on same meds.  follow up Dr. Sherene Sires  In 3 months  Follow med calendar closely and bring to each visit.     05/22/2011 f/u ov/[name] [name] cc cough x 2 weeks still with discolored sputum and sense of chest congestion on day 6 of doxy called in.  No previous h/o pna or asthma but does tend to cough with gerd, now on bid ppi.  rec Prednisone 10 mg take  4 each am x 2 days,   2 each am x 2 days,  1 each am x2days and stop  Finish the doxycycline and call 1st of week if the sputum is still discolored Take delsym two tsp every 12 hours and supplement if needed with  tramadol 50 mg up to 2 every 4 hours   Once you have eliminated the cough for 3 straight days try reducing the tramadol first,  then the delsym as tolerated Levaquin x 5 days     07/04/2011 f/u ov/[name] [name] here for f/u cxr with minimal residual daytime dry cough but cc L leg swelling ? went to [facility] on 06/30/11- dxed with cellulitis left leg. She states that overall doing much better, still has some mild pain in ankle. Onset was acute on 11/30 minimal fever, no rigors, no injury or cut to L leg x shaves legs repeatedly with same razor rec GERD   Whenever you start coughing for any reason need to immediately Prilosec 20 mg take 2x 30-60 min before first meal of the day until the cough is gone Please see patient coordinator before you leave today  to schedule venous dopplers> neg  10/22/2011 f/u ov/[name] [name] cc still dry cough with "throat congestion" off and on but in general much better when use 2 prilosec ac rec Change prilosec to 40 mg Take 30-60 min before first meal of the day  03/03/2012 f/u ov/[name] [name] cc CPX  multiple chronic healthcare problems,  R ear roaring x months, indolent onset, nightly when lies on R side, no pain or hearing problem. No tia, claudication,  Sob or active sinus complaints >labs , no changes   03/24/12 Follow up / NP Returns for follow up and med review. 3 weeks ago seen for r CPX. Labs were essentially unremarkable. W/ LDL at goal on Zocor 20mg . We reviewed meds today and organized them into med calendar w/ pt educaiton. Appears to be taking meds correctly .   Has stopped up ear with decreased hearing. Wax impaction in past and noted on exam last ov.  OTC wax solution not working.   She has hx  of osteoporosis with last BMD 2012  w/ max tscore -2.9. She has been intolerant to bisphosnates in past. We discussed several options for her including Reclast . She declines reclast and therapies -wants to remain on Ca and vitam D . Advised on weight bearing exercises as well rec Continue on current regimen.  Follow med calendar closely and bring to each visit.  06/23/2012 f/u ov/[name] [name] using calendar well cc f/u chol, bp, osteoporosis.  Not willing to consider reclast, no tia, cp, sob, claudication. rec Follow calendar   12/29/2012 f/u ov/[name] [name] re hbp, osteo, hyperlipidemia Chief Complaint  Patient presents with  . Follow-up    Pt states overall doing well. She has had some episodes of ringing in her ears, and read that this can be a s/e of lasix.   no sob, no tia or claudication symptoms.  Sleeping ok without nocturnal   or early am exacerbation  of respiratory  c/o's  Also denies any obvious fluctuation of symptoms with weather or environmental changes or other aggravating or alleviating factors except as outlined above  Current Medications, Allergies,  Past Surgical History, Family History, and Social History were reviewed in [employer] record.  ROS  The following are not active complaints unless bolded sore throat, dysphagia, dental problems, itching, sneezing,  nasal congestion or excess/ purulent secretions, ear ache,   fever, chills, sweats, unintended wt loss, pleuritic or exertional cp, hemoptysis,  orthopnea pnd or leg swelling, presyncope, palpitations, heartburn, abdominal pain, anorexia, nausea, vomiting, diarrhea  or change in bowel or urinary habits, change in stools or urine, dysuria,hematuria,  rash, arthralgias, visual complaints, headache, numbness weakness or ataxia or problems with walking or coordination,  change in mood/affect or memory.            Past Medical History:  HYPERTENSION (ICD-401.9)  Hyperlipidemia (ICD-272.4) target LDL less than 130 based on positive family history and hypertension BEN HTN HEART DISEASE WITHOUT HEART FAIL (ICD-402.10)  CORONARY HEART DISEASE (ICD-V17.3)  PLANTAR FASCIITIS (ICD-728.71)  COLONIC POLYPS (ICD-211.3) and Diverticulosis........Marland KitchenRussella Dar  -colonoscopy 11/20/2005 and 06/03/11 (Diverticula only)   G E R D (ICD-530.81)  - see EGD 11/19/06 and 06/03/11  Pos HH OBESITY (ICD-278.00)  PALPITATIONS (ICD-785.1)..................................................................Marland KitchenNishan  UNSPECIFIED HEMORRHOIDS WITH OTHER COMPLICATION (ICD-455.8)  IBS (ICD-564.1)  Back Pain..............................................................................................Marland KitchenRamos  OSTEOPOROSIS (ICD-733.00)  - CT bone densitometry November 16 2006 : PA Spine -2.6 left fem neck -2.9, right fem neck -2.3  RE-DEXA 02/01/09 -2.5 -2.8 -2.5  - intolerant of  biphosphonates  Dexa 2012 >max tscore -2.9 , declined Reclast  HEALTH MAINTENANCE.............................................................Marland KitchenWert  - dt 08/2004 - Pneumovax 08/2004 - CPX 03/03/2012   - GYN [name]  - MED CALENDAR 12/12/10 , 03/24/12               Objective:    GENERAL: A/Ox3; pleasant & cooperative.NAD   WT 162 September 27, 2008 >155 May 31, 2010 .>> 154 05/22/2011 > 10/22/2011 152 > 03/03/2012 145> 06/23/2012  148 > 12/29/2012  156  HEENT: [place]/AT ,  EACs- bilateral wax impaction, NOSE-clear, THROAT-clear & wnl.  NECK: Supple w/ fair ROM; no JVD; normal carotid impulses w/o bruits; no thyromegaly or nodules palpated; no lymphadenopathy.  CHEST: clear to a and p and no rhonchi HEART: RRR, no m/r/g heard  -no edema/ sym pulses both feet and carotids/no bruits ABDOMEN: Soft & nt; nml bowel sounds; no organomegaly or masses detected, no guarding or rebound, neg CVA tenderness  EXT: Warm bilat, no calf pain, edema, clubbing, pulses intact  SKIN:   No rash or lesion  Neuro: nl sensorium, no motor or cerbellar deficits MS nl gait, station, no obvious joint restrictions   CXR  03/03/2012 :  Comparison: 07/04/2011  Findings: Stable mild cardiomegaly. Tortuous atheromatous aorta. Lungs clear. No effusion. Regional bones unremarkable.  IMPRESSION:  Stable cardiomegaly     .   [name]       Assessment & Plan:

## 2012-12-29 NOTE — Patient Instructions (Addendum)
See calendar for specific medication instructions and bring it back for each and every office visit for every healthcare provider you see.  Without it,  you may not receive the best quality medical care that we feel you deserve.  You will note that the calendar groups together  your maintenance  medications that are timed at particular times of the day.  Think of this as your checklist for what your doctor has instructed you to do until your next evaluation to see what benefit  there is  to staying on a consistent group of medications intended to keep you well.  The other group at the bottom is entirely up to you to use as you see fit  for specific symptoms that may arise between visits that require you to treat them on an as needed basis.  Think of this as your action plan or "what if" list.   Separating the top medications from the bottom group is fundamental to providing you adequate care going forward.    Use over the counter ear wax remover kit and see Tammy NP if not better   Comprehensive eval mid august of this year (cpx)

## 2012-12-29 NOTE — Assessment & Plan Note (Signed)
-   target LDL less than 130 based on positive family history and hypertension   Lab Results  Component Value Date   CHOL 195 03/03/2012   HDL 57.10 03/03/2012   LDLCALC 111* 03/03/2012   LDLDIRECT 126.6 10/29/2010   TRIG 136.0 03/03/2012   CHOLHDL 3 03/03/2012     Adequate control on present rx, reviewed

## 2012-12-29 NOTE — Assessment & Plan Note (Signed)
rec ear wax kit first then return for irrigation

## 2012-12-29 NOTE — Assessment & Plan Note (Signed)
-   CT bone densitometry November 16 2006 : PA Spine -2.6 left fem neck -2.9, right fem neck -2.3  RE -DEXA 02/01/09 -2.5 -2.8 -2.5  RE-DEXA 02/05/11 -2.4, -2.9, - 2.7 - intolerant of biphosphonates  >declined reclast 03/24/12 and 06/23/12  Reviewed risks, declined further eval or rx at this point, revisit issue at cpx

## 2013-01-14 ENCOUNTER — Other Ambulatory Visit: Payer: Self-pay | Admitting: Internal Medicine

## 2013-01-25 ENCOUNTER — Telehealth: Payer: Self-pay | Admitting: Internal Medicine

## 2013-01-25 MED ORDER — BISOPROLOL-HYDROCHLOROTHIAZIDE 5-6.25 MG PO TABS: ORAL_TABLET | ORAL | Status: DC

## 2013-01-25 NOTE — Telephone Encounter (Signed)
Pt aware RX sent nothing further was needed

## 2013-02-28 ENCOUNTER — Other Ambulatory Visit: Payer: Self-pay | Admitting: Obstetrics and Gynecology

## 2013-03-09 ENCOUNTER — Other Ambulatory Visit (INDEPENDENT_AMBULATORY_CARE_PROVIDER_SITE_OTHER): Payer: Medicare Other

## 2013-03-09 ENCOUNTER — Ambulatory Visit (INDEPENDENT_AMBULATORY_CARE_PROVIDER_SITE_OTHER): Payer: Medicare Other | Admitting: Internal Medicine

## 2013-03-09 ENCOUNTER — Encounter: Payer: Self-pay | Admitting: Internal Medicine

## 2013-03-09 VITALS — BP 132/72 | HR 60 | Temp 97.9°F | Ht 60.0 in | Wt 154.0 lb

## 2013-03-09 DIAGNOSIS — I119 Hypertensive heart disease without heart failure: Secondary | ICD-10-CM

## 2013-03-09 DIAGNOSIS — H6120 Impacted cerumen, unspecified ear: Secondary | ICD-10-CM

## 2013-03-09 DIAGNOSIS — E785 Hyperlipidemia, unspecified: Secondary | ICD-10-CM

## 2013-03-09 DIAGNOSIS — R05 Cough: Secondary | ICD-10-CM

## 2013-03-09 DIAGNOSIS — K589 Irritable bowel syndrome without diarrhea: Secondary | ICD-10-CM

## 2013-03-09 DIAGNOSIS — M810 Age-related osteoporosis without current pathological fracture: Secondary | ICD-10-CM

## 2013-03-09 DIAGNOSIS — H6123 Impacted cerumen, bilateral: Secondary | ICD-10-CM

## 2013-03-09 DIAGNOSIS — I10 Essential (primary) hypertension: Secondary | ICD-10-CM

## 2013-03-09 LAB — BASIC METABOLIC PANEL
BUN: 13 mg/dL (ref 6–23)
Chloride: 104 mEq/L (ref 96–112)
Potassium: 3.8 mEq/L (ref 3.5–5.1)
Sodium: 141 mEq/L (ref 135–145)

## 2013-03-09 LAB — CBC WITH DIFFERENTIAL/PLATELET
Basophils Relative: 0.4 % (ref 0.0–3.0)
Eosinophils Relative: 2.7 % (ref 0.0–5.0)
HCT: 40.1 % (ref 36.0–46.0)
Hemoglobin: 13.4 g/dL (ref 12.0–15.0)
Lymphs Abs: 1.9 10*3/uL (ref 0.7–4.0)
MCV: 87.9 fl (ref 78.0–100.0)
Monocytes Absolute: 0.4 10*3/uL (ref 0.1–1.0)
Monocytes Relative: 7.6 % (ref 3.0–12.0)
Neutro Abs: 3.1 10*3/uL (ref 1.4–7.7)
Platelets: 219 10*3/uL (ref 150.0–400.0)
RBC: 4.56 Mil/uL (ref 3.87–5.11)
WBC: 5.5 10*3/uL (ref 4.5–10.5)

## 2013-03-09 LAB — LIPID PANEL
Cholesterol: 199 mg/dL (ref 0–200)
VLDL: 37.4 mg/dL (ref 0.0–40.0)

## 2013-03-09 LAB — HEPATIC FUNCTION PANEL
ALT: 18 U/L (ref 0–35)
AST: 20 U/L (ref 0–37)
Albumin: 4.2 g/dL (ref 3.5–5.2)
Total Bilirubin: 1.6 mg/dL — ABNORMAL HIGH (ref 0.3–1.2)
Total Protein: 7.7 g/dL (ref 6.0–8.3)

## 2013-03-09 LAB — URINALYSIS, ROUTINE W REFLEX MICROSCOPIC
Bilirubin Urine: NEGATIVE
Nitrite: NEGATIVE
Total Protein, Urine: NEGATIVE
Urine Glucose: NEGATIVE
pH: 7.5 (ref 5.0–8.0)

## 2013-03-09 LAB — TSH: TSH: 3.28 u[IU]/mL (ref 0.35–5.50)

## 2013-03-09 NOTE — Patient Instructions (Addendum)
Try change nexium to pepcid ac 20 mg twice daily for the next six weeks and change back to nexium if cough or indigestion  Try change oscal d to the equivalent in a calcium gluconate form (ask pharmacist for help as this is otc)  Please remember to go to the lab and x-ray department downstairs for your tests - we will call you with the results when they are available.    See Tammy NP in 6 weeks with all your medications, even over the counter meds, separated in two separate bags, the ones you take no matter what vs the ones you stop once you feel better and take only as needed when you feel you need them.   Tammy  will generate for you a new user friendly medication calendar that will put [place] all on the same page re: your medication use.     Without this process, it simply isn't possible to assure that we are providing  your outpatient care  with  the attention to detail we feel you deserve.   If we cannot assure that you're getting that kind of care,  then we cannot manage your problem effectively from this clinic.  Once you have seen Tammy and we are sure that we're all on the same page with your medication use she will arrange follow up with me.

## 2013-03-09 NOTE — Progress Notes (Signed)
Subjective:    Patient ID: [name] [name], female    DOB: [birth year]   MRN: [id number]  Brief patient profile:   36 yowf never smoker with long-standing hyperlipidemia hypertension and a positive family history of heart disease so ldl target < 130     HPI 12/12/10 Follow and med review  Pt returns for follow up and med review. She was seen last month for CPX. Labs were essentially unremarkable. W/ LDL at goal on Zocor 20mg . We reviewed meds today and organized them into med calendar w/ pt educaiton.   Leg and knee pain is better. She did not go to ortho.   She has hx of osteoporosis with last BMD 2008 w/ tscore -2.6. She has been intolerant to bisphosnates in past. We discussed she may be candidate for Reclast. Pt info sheet given w/ pt education.  We will set you up for a Bone Density  Continue on same meds.  follow up Dr. Sherene Sires  In 3 months  Follow med calendar closely and bring to each visit.     05/22/2011 f/u ov/[name] [name] cc cough x 2 weeks still with discolored sputum and sense of chest congestion on day 6 of doxy called in.  No previous h/o pna or asthma but does tend to cough with gerd, now on bid ppi.  rec Prednisone 10 mg take  4 each am x 2 days,   2 each am x 2 days,  1 each am x2days and stop  Finish the doxycycline and call 1st of week if the sputum is still discolored Take delsym two tsp every 12 hours and supplement if needed with  tramadol 50 mg up to 2 every 4 hours   Once you have eliminated the cough for 3 straight days try reducing the tramadol first,  then the delsym as tolerated Levaquin x 5 days     07/04/2011 f/u ov/[name] [name] here for f/u cxr with minimal residual daytime dry cough but cc L leg swelling ? went to [facility] on 06/30/11- dxed with cellulitis left leg. She states that overall doing much better, still has some mild pain in ankle. Onset was acute on 11/30 minimal fever, no rigors, no injury or cut to L leg x shaves legs repeatedly with same razor rec GERD   Whenever you start coughing for any reason need to immediately Prilosec 20 mg take 2x 30-60 min before first meal of the day until the cough is gone Please see patient coordinator before you leave today  to schedule venous dopplers> neg  10/22/2011 f/u ov/[name] [name] cc still dry cough with "throat congestion" off and on but in general much better when use 2 prilosec ac rec Change prilosec to 40 mg Take 30-60 min before first meal of the day  03/03/2012 f/u ov/[name] [name] cc CPX  multiple chronic healthcare problems,  R ear roaring x months, indolent onset, nightly when lies on R side, no pain or hearing problem. No tia, claudication,  Sob or active sinus complaints >labs , no changes   03/24/12 Follow up / NP Returns for follow up and med review. 3 weeks ago seen for r CPX. Labs were essentially unremarkable. W/ LDL at goal on Zocor 20mg . We reviewed meds today and organized them into med calendar w/ pt educaiton. Appears to be taking meds correctly .   Has stopped up ear with decreased hearing. Wax impaction in past and noted on exam last ov.  OTC wax solution not working.   She has hx  of osteoporosis with last BMD 2012  w/ max tscore -2.9. She has been intolerant to bisphosnates in past. We discussed several options for her including Reclast . She declines reclast and therapies -wants to remain on Ca and vitam D . Advised on weight bearing exercises as well rec Continue on current regimen.  Follow med calendar closely and bring to each visit.  06/23/2012 f/u ov/[name] [name] using calendar well cc f/u chol, bp, osteoporosis.  Not willing to consider reclast, no tia, cp, sob, claudication. rec Follow calendar   12/29/2012 f/u ov/[name] [name] re hbp, osteo, hyperlipidemia Chief Complaint  Patient presents with  . Follow-up    Pt states overall doing well. She has had some episodes of ringing in her ears, and read that this can be a s/e of lasix.   no sob, no tia or claudication symptoms. rec Wax rx   03/09/2013    ov/[name] [name] re yearly comprehenive eval for :  hbp, osteo, hyperlipidemia Chief Complaint  Patient presents with  . Annual Exam    Pt c/o lower abdominal pain x several wks- pain comes and goes and is described as dull. Denies any changes in bowels, nausea or vomiting.    pain similar to prev pain x years, always in sitting position, no change in bms, some better with ibs diet and citrucel.  No wt loss, anorexia   Sleeping ok without nocturnal  or early am exacerbation  of respiratory  c/o's  Also denies any obvious fluctuation of symptoms with weather or environmental changes or other aggravating or alleviating factors except as outlined above  Current Medications, Allergies,  Past Surgical History, Family History, and Social History were reviewed in [employer] record.  ROS  The following are not active complaints unless bolded sore throat, dysphagia, dental problems, itching, sneezing,  nasal congestion or excess/ purulent secretions, ear ache,   fever, chills, sweats, unintended wt loss, pleuritic or exertional cp, hemoptysis,  orthopnea pnd or leg swelling, presyncope, palpitations, heartburn, abdominal pain, anorexia, nausea, vomiting, diarrhea  or change in bowel or urinary habits, change in stools or urine, dysuria,hematuria,  rash, arthralgias, visual complaints, headache, numbness weakness or ataxia or problems with walking or coordination,  change in mood/affect or memory.            Past Medical History:  HYPERTENSION (ICD-401.9)  Hyperlipidemia (ICD-272.4) target LDL less than 130 based on positive family history and hypertension BEN HTN HEART DISEASE WITHOUT HEART FAIL (ICD-402.10)  CORONARY HEART DISEASE (ICD-V17.3)  PLANTAR FASCIITIS (ICD-728.71)  COLONIC POLYPS (ICD-211.3) and Diverticulosis........Marland KitchenRussella Dar  -colonoscopy 11/20/2005 and 06/03/11 (Diverticula only)   G E R D (ICD-530.81)  - see EGD 11/19/06 and 06/03/11  Pos HH OBESITY (ICD-278.00)   PALPITATIONS (ICD-785.1)..................................................................Marland KitchenNishan  UNSPECIFIED HEMORRHOIDS WITH OTHER COMPLICATION (ICD-455.8)  IBS (ICD-564.1)  Back Pain..............................................................................................Marland KitchenRamos  OSTEOPOROSIS (ICD-733.00)  - CT bone densitometry November 16 2006 : PA Spine -2.6 left fem neck -2.9, right fem neck -2.3  RE-DEXA 02/01/09 -2.5 -2.8 -2.5  - intolerant of biphosphonates  Dexa 2012 >max tscore -2.9 , declined Reclast  HEALTH MAINTENANCE.............................................................Marland KitchenWert  - dt 08/2004 - Pneumovax 08/2004 - CPX 03/09/2013  - GYN [name]  - MED CALENDAR 12/12/10 , 03/24/12               Objective:    GENERAL: A/Ox3; pleasant & cooperative.NAD   WT 162 September 27, 2008 >155 May 31, 2010 .>> 154 05/22/2011 > 10/22/2011 152 > 03/03/2012 145> 06/23/2012  148 > 12/29/2012  156 >  03/09/2013   154 HEENT: [place]/AT ,  EACs- L  wax impaction, NOSE-clear, THROAT-clear & wnl.  NECK: Supple w/ fair ROM; no JVD; normal carotid impulses w/o bruits; no thyromegaly or nodules palpated; no lymphadenopathy.  CHEST: clear to a and p and no rhonchi HEART: RRR, no m/r/g heard  -no edema/ sym pulses both feet and carotids/no bruits ABDOMEN: Soft & nt; nml bowel sounds; no organomegaly or masses detected, no guarding or rebound, neg CVA tenderness  EXT: Warm bilat, no calf pain, edema, clubbing, pulses intact SKIN:   No rash or lesion  Neuro: nl sensorium, no motor or cerbellar deficits MS nl gait, station, no obvious joint restrictions    CXR  03/09/2013 :    Did not go for cxr    Assessment & Plan:

## 2013-03-10 LAB — VITAMIN D 25 HYDROXY (VIT D DEFICIENCY, FRACTURES): Vit D, 25-Hydroxy: 45 ng/mL (ref 30–89)

## 2013-03-10 NOTE — Progress Notes (Signed)
Quick Note:  Spoke with pt and notified of results per Dr. [name]. Pt verbalized understanding and denied any questions.  ______ 

## 2013-03-11 NOTE — Assessment & Plan Note (Signed)
-   target LDL less than 130 based on positive family history and hypertension   Lab Results  Component Value Date   CHOL 199 03/09/2013   HDL 49.80 03/09/2013   LDLCALC 112* 03/09/2013   LDLDIRECT 126.6 10/29/2010   TRIG 187.0* 03/09/2013   CHOLHDL 4 03/09/2013     Adequate control on present rx, reviewed > no change in rx needed

## 2013-03-11 NOTE — Assessment & Plan Note (Signed)
Classic subdiaphragmatic pain pattern suggests ibs:  Stereotypical, migratory with a very limited distribution of pain locations, daytime, not exacerbated by ex or coughing, worse in sitting position, associated with generalized abd bloating, not present supine due to the dome effect of the diaphragm is  canceled in that position. Frequently these patients have had multiple negative GI workups and CT scans.  Treatment consists of avoiding foods that cause gas (especially beans and raw vegetables like spinach and salads)  and citrucel 1 heaping tsp twice daily with a large glass of water.  Pain should improve w/in 2 weeks and if not then consider further GI work up.      

## 2013-03-11 NOTE — Assessment & Plan Note (Signed)
-   R ear obstructed 03/03/2012 > recurrent  Bilateral 12/29/2012  > L sided 03/09/13   Discussed otc rx/ fu/

## 2013-03-11 NOTE — Assessment & Plan Note (Signed)
Lab Results  Component Value Date   CREATININE 0.7 03/09/2013   CREATININE 0.7 03/03/2012   CREATININE 0.7 03/26/2011     Adequate control on present rx, reviewed > no change in rx needed

## 2013-03-11 NOTE — Assessment & Plan Note (Signed)
-   CT bone densitometry November 16 2006 : PA Spine -2.6 left fem neck -2.9, right fem neck -2.3  RE -DEXA 02/01/09 -2.5 -2.8 -2.5  RE-DEXA 02/05/11 -2.4, -2.9, - 2.7 - intolerant of biphosphonates  >declined reclast 03/24/12 and 06/23/12  Reviewed risks and adequate Calcium (gluconate) and D

## 2013-04-20 ENCOUNTER — Ambulatory Visit (INDEPENDENT_AMBULATORY_CARE_PROVIDER_SITE_OTHER): Payer: Medicare Other | Admitting: Adult Health

## 2013-04-20 ENCOUNTER — Encounter: Payer: Self-pay | Admitting: Adult Health

## 2013-04-20 VITALS — BP 120/72 | HR 65 | Temp 98.6°F | Ht 60.0 in | Wt 156.2 lb

## 2013-04-20 DIAGNOSIS — Z23 Encounter for immunization: Secondary | ICD-10-CM

## 2013-04-20 NOTE — Patient Instructions (Addendum)
Continue on current regimen.  Follow med calendar closely and bring to each visit.  follow up Dr. Sherene Sires  In 6 months and As needed   Flu shot today

## 2013-04-26 NOTE — Progress Notes (Signed)
Subjective:    Patient ID: [name] [name], female    DOB: 1941/06/02   MRN: [id number]  Brief patient profile:  59 yowf never smoker with long-standing hyperlipidemia hypertension and a positive family history of heart disease so ldl target < 130    HPI 12/12/10 Follow and med review  Pt returns for follow up and med review. She was seen last month for CPX. Labs were essentially unremarkable. W/ LDL at goal on Zocor 20mg . We reviewed meds today and organized them into med calendar w/ pt educaiton.   Leg and knee pain is better. She did not go to ortho.   She has hx of osteoporosis with last BMD 2008 w/ tscore -2.6. She has been intolerant to bisphosnates in past. We discussed she may be candidate for Reclast. Pt info sheet given w/ pt education.  We will set you up for a Bone Density  Continue on same meds.  follow up Dr. Sherene Sires  In 3 months  Follow med calendar closely and bring to each visit.     05/22/2011 f/u ov/[name] cc cough x 2 weeks still with discolored sputum and sense of chest congestion on day 6 of doxy called in.  No previous h/o pna or asthma but does tend to cough with gerd, now on bid ppi.  rec Prednisone 10 mg take  4 each am x 2 days,   2 each am x 2 days,  1 each am x2days and stop  Finish the doxycycline and call 1st of week if the sputum is still discolored Take delsym two tsp every 12 hours and supplement if needed with  tramadol 50 mg up to 2 every 4 hours   Once you have eliminated the cough for 3 straight days try reducing the tramadol first,  then the delsym as tolerated Levaquin x 5 days     07/04/2011 f/u ov/[name] here for f/u cxr with minimal residual daytime dry cough but cc L leg swelling ? went to [facility] on 06/30/11- dxed with cellulitis left leg. She states that overall doing much better, still has some mild pain in ankle. Onset was acute on 11/30 minimal fever, no rigors, no injury or cut to L leg x shaves legs repeatedly with same razor rec GERD  Whenever  you start coughing for any reason need to immediately Prilosec 20 mg take 2x 30-60 min before first meal of the day until the cough is gone Please see patient coordinator before you leave today  to schedule venous dopplers> neg  10/22/2011 f/u ov/[name] cc still dry cough with "throat congestion" off and on but in general much better when use 2 prilosec ac rec Change prilosec to 40 mg Take 30-60 min before first meal of the day  03/03/2012 f/u ov/[name] cc CPX  multiple chronic healthcare problems,  R ear roaring x months, indolent onset, nightly when lies on R side, no pain or hearing problem. No tia, claudication,  Sob or active sinus complaints >labs , no changes   03/24/12 Follow up / NP Returns for follow up and med review. 3 weeks ago seen for r CPX. Labs were essentially unremarkable. W/ LDL at goal on Zocor 20mg . We reviewed meds today and organized them into med calendar w/ pt educaiton. Appears to be taking meds correctly .   Has stopped up ear with decreased hearing. Wax impaction in past and noted on exam last ov.  OTC wax solution not working.   She has hx of osteoporosis  with last BMD 2012  w/ max tscore -2.9. She has been intolerant to bisphosnates in past. We discussed several options for her including Reclast . She declines reclast and therapies -wants to remain on Ca and vitam D . Advised on weight bearing exercises as well rec Continue on current regimen.  Follow med calendar closely and bring to each visit.  06/23/2012 f/u ov/[name] using calendar well cc f/u chol, bp, osteoporosis.  Not willing to consider reclast, no tia, cp, sob, claudication. rec Follow calendar   12/29/2012 f/u ov/[name] re hbp, osteo, hyperlipidemia Chief Complaint  Patient presents with  . Follow-up    Pt states overall doing well. She has had some episodes of ringing in her ears, and read that this can be a s/e of lasix.   no sob, no tia or claudication symptoms. rec Wax rx   03/09/2013   ov/[name] re  yearly comprehenive eval for :  hbp, osteo, hyperlipidemia Chief Complaint  Patient presents with  . Annual Exam    Pt c/o lower abdominal pain x several wks- pain comes and goes and is described as dull. Denies any changes in bowels, nausea or vomiting.    pain similar to prev pain x years, always in sitting position, no change in bms, some better with ibs diet and citrucel.  No wt loss, anorexia >>trial of pepcid in place of nexium   04/20/13 Follow up and med review  Returns for 1 month follow up and med review  We reviewed meds today and organized them into med calendar w/ pt educaiton. Appears to be taking meds correctly .  Was seen last month for CPX . LDL at goal at 112.  Vitamin D nml  Says she is doing well. Tried pepcid in place of nexium , felt better on Nexium.  No chest pain , n/v/d, dyspnea, hemoptysis, edema.  Stomach is feeling better .    Current Medications, Allergies,  Past Surgical History, Family History, and Social History were reviewed in [employer] record.  ROS  The following are not active complaints unless bolded sore throat, dysphagia, dental problems, itching, sneezing,  nasal congestion or excess/ purulent secretions, ear ache,   fever, chills, sweats, unintended wt loss, pleuritic or exertional cp, hemoptysis,  orthopnea pnd or leg swelling, presyncope, palpitations, heartburn, abdominal pain, anorexia, nausea, vomiting, diarrhea  or change in bowel or urinary habits, change in stools or urine, dysuria,hematuria,  rash, arthralgias, visual complaints, headache, numbness weakness or ataxia or problems with walking or coordination,  change in mood/affect or memory.            Past Medical History:  HYPERTENSION (ICD-401.9)  Hyperlipidemia (ICD-272.4) target LDL less than 130 based on positive family history and hypertension BEN HTN HEART DISEASE WITHOUT HEART FAIL (ICD-402.10)  CORONARY HEART DISEASE (ICD-V17.3)  PLANTAR FASCIITIS  (ICD-728.71)  COLONIC POLYPS (ICD-211.3) and Diverticulosis........Marland KitchenRussella Dar  -colonoscopy 11/20/2005 and 06/03/11 (Diverticula only)   G E R D (ICD-530.81)  - see EGD 11/19/06 and 06/03/11  Pos HH OBESITY (ICD-278.00)  PALPITATIONS (ICD-785.1)..................................................................Marland KitchenNishan  UNSPECIFIED HEMORRHOIDS WITH OTHER COMPLICATION (ICD-455.8)  IBS (ICD-564.1)  Back Pain..............................................................................................Marland KitchenRamos  OSTEOPOROSIS (ICD-733.00)  - CT bone densitometry November 16 2006 : PA Spine -2.6 left fem neck -2.9, right fem neck -2.3  RE-DEXA 02/01/09 -2.5 -2.8 -2.5  - intolerant of biphosphonates  Dexa 2012 >max tscore -2.9 , declined Reclast  HEALTH MAINTENANCE.............................................................Marland KitchenWert  - dt 08/2004 - Pneumovax 08/2004 - CPX 03/09/2013  - GYN Marcelle Overlie  -  MED CALENDAR 12/12/10 , 03/24/12 , 04/20/13               Objective:    GENERAL: A/Ox3; pleasant & cooperative.NAD   WT 162 September 27, 2008 >155 May 31, 2010 .>> 154 05/22/2011 > 10/22/2011 152 > 03/03/2012 145> 06/23/2012  148 > 12/29/2012  156 > 03/09/2013   154>156 04/20/13  HEENT: [place]/AT , EAC mild wax on left  NOSE-clear, THROAT-clear & wnl.  NECK: Supple w/ fair ROM; no JVD; normal carotid impulses w/o bruits; no thyromegaly or nodules palpated; no lymphadenopathy.  CHEST: clear to a and p and no rhonchi HEART: RRR, no m/r/g heard  -no edema/ sym pulses both feet and carotids/no bruits ABDOMEN: Soft & nt; nml bowel sounds; no organomegaly or masses detected, no guarding or rebound,   EXT: Warm bilat, no calf pain, edema, clubbing, pulses intact SKIN:   No rash or lesion  Neuro: nl sensorium, no motor or cerbellar deficits MS nl gait, station, no obvious joint restrictions    CXR  03/09/2012 Stable mild cardiomegaly. Tortuous atheromatous aorta.  Lungs clear.    Assessment & Plan:

## 2013-05-13 ENCOUNTER — Other Ambulatory Visit (HOSPITAL_COMMUNITY): Payer: Self-pay | Admitting: Obstetrics and Gynecology

## 2013-05-13 DIAGNOSIS — Z1231 Encounter for screening mammogram for malignant neoplasm of breast: Secondary | ICD-10-CM

## 2013-05-30 ENCOUNTER — Ambulatory Visit (HOSPITAL_COMMUNITY)
Admission: RE | Admit: 2013-05-30 | Discharge: 2013-05-30 | Disposition: A | Discharge status: Home / Self Care | Payer: Medicare Other | Source: Ambulatory Visit | Attending: Obstetrics and Gynecology | Admitting: Obstetrics and Gynecology

## 2013-05-30 DIAGNOSIS — Z1231 Encounter for screening mammogram for malignant neoplasm of breast: Secondary | ICD-10-CM | POA: Insufficient documentation

## 2013-06-02 ENCOUNTER — Other Ambulatory Visit: Payer: Self-pay | Admitting: Internal Medicine

## 2013-06-22 ENCOUNTER — Telehealth: Payer: Self-pay | Admitting: Internal Medicine

## 2013-06-22 NOTE — Telephone Encounter (Signed)
Pt requesting recommendations of a PCP in the area Insurance is not wanting to accept/cover Dr Sherene Sires as pt PCP d/t him being a specialist. Pt states that she contacted [facility] and they are not accepting new patients at this time.  Pt needs to establish with a new PCP before Jul 27, 2013.  Please advise Dr Sherene Sires of any recs. Thanks.

## 2013-06-22 NOTE — Telephone Encounter (Signed)
There might be a waiting list but should be able to get in with the closest [facility] site to where she lives - [place], can you help with this?  If not, refer to [facility]

## 2013-06-22 NOTE — Telephone Encounter (Signed)
Phone busy x2 Tobe Sos

## 2013-06-30 NOTE — Telephone Encounter (Signed)
Pt is willing to see any prim care md's in [place] and will wait to be seen just needs to establish because her ins changed to Pernell Dupre

## 2013-07-01 NOTE — Telephone Encounter (Signed)
appt to see dr Oliver Barre 10/04/13@9 :30am pt is aware [name] [name]

## 2013-07-01 NOTE — Telephone Encounter (Signed)
Do you need an order for this?  [name] [name], CMA

## 2013-08-01 ENCOUNTER — Other Ambulatory Visit: Payer: Self-pay | Admitting: *Deleted

## 2013-08-01 NOTE — Telephone Encounter (Signed)
A user error has taken place.

## 2013-08-26 ENCOUNTER — Other Ambulatory Visit: Payer: Self-pay | Admitting: *Deleted

## 2013-08-26 NOTE — Telephone Encounter (Signed)
Received fax from [facility] wanting new rx's on omeprazole, simvastatin & hyzaar. Not a patient of Dr. Asa Lente. Per chart pt see Dr. Melvyn Novas...[name] [name]

## 2013-10-04 ENCOUNTER — Ambulatory Visit: Payer: Medicare Other | Admitting: Internal Medicine

## 2013-10-07 ENCOUNTER — Encounter: Payer: Self-pay | Admitting: Internal Medicine

## 2013-10-07 ENCOUNTER — Ambulatory Visit (INDEPENDENT_AMBULATORY_CARE_PROVIDER_SITE_OTHER): Payer: Medicare HMO | Admitting: Internal Medicine

## 2013-10-07 VITALS — BP 122/84 | HR 63 | Temp 98.2°F | Ht 60.0 in | Wt 151.1 lb

## 2013-10-07 DIAGNOSIS — E785 Hyperlipidemia, unspecified: Secondary | ICD-10-CM

## 2013-10-07 DIAGNOSIS — K219 Gastro-esophageal reflux disease without esophagitis: Secondary | ICD-10-CM

## 2013-10-07 DIAGNOSIS — I10 Essential (primary) hypertension: Secondary | ICD-10-CM

## 2013-10-07 DIAGNOSIS — Z23 Encounter for immunization: Secondary | ICD-10-CM

## 2013-10-07 MED ORDER — OMEPRAZOLE 20 MG PO CPDR: 20.0000 mg | DELAYED_RELEASE_CAPSULE | Freq: Every day | ORAL | Status: DC

## 2013-10-07 MED ORDER — BISOPROLOL-HYDROCHLOROTHIAZIDE 5-6.25 MG PO TABS: 1.0000 | ORAL_TABLET | Freq: Every day | ORAL | Status: DC

## 2013-10-07 MED ORDER — SIMVASTATIN 20 MG PO TABS: 20.0000 mg | ORAL_TABLET | Freq: Every day | ORAL | Status: DC

## 2013-10-07 NOTE — Patient Instructions (Addendum)
It was good to see you today.  We have reviewed your prior records including labs and tests today  Immunizations reviewed and updated. Pneumonia vaccine and tetanus immunization updated today  Medications reviewed and updated  Decrease omeprazole to 20 mg daily, no other changes recommended at this time. Refill on medication(s) as discussed today.  Please schedule followup in August or Sept 2015 for annual exam and labs, call sooner if problems. Hypertension As your heart beats, it forces blood through your arteries. This force is your blood pressure. If the pressure is too high, it is called hypertension (HTN) or high blood pressure. HTN is dangerous because you may have it and not know it. High blood pressure may mean that your heart has to work harder to pump blood. Your arteries may be narrow or stiff. The extra work puts you at risk for heart disease, stroke, and other problems.  Blood pressure consists of two numbers, a higher number over a lower, 110/72, for example. It is stated as "110 over 72." The ideal is below 120 for the top number (systolic) and under 80 for the bottom (diastolic). Write down your blood pressure today. You should pay close attention to your blood pressure if you have certain conditions such as:  Heart failure.  Prior heart attack.  Diabetes  Chronic kidney disease.  Prior stroke.  Multiple risk factors for heart disease. To see if you have HTN, your blood pressure should be measured while you are seated with your arm held at the level of the heart. It should be measured at least twice. A one-time elevated blood pressure reading (especially in the Emergency Department) does not mean that you need treatment. There may be conditions in which the blood pressure is different between your right and left arms. It is important to see your caregiver soon for a recheck. Most people have essential hypertension which means that there is not a specific cause. This type  of high blood pressure may be lowered by changing lifestyle factors such as:  Stress.  Smoking.  Lack of exercise.  Excessive weight.  Drug/tobacco/alcohol use.  Eating less salt. Most people do not have symptoms from high blood pressure until it has caused damage to the body. Effective treatment can often prevent, delay or reduce that damage. TREATMENT  When a cause has been identified, treatment for high blood pressure is directed at the cause. There are a large number of medications to treat HTN. These fall into several categories, and your caregiver will help you select the medicines that are best for you. Medications may have side effects. You should review side effects with your caregiver. If your blood pressure stays high after you have made lifestyle changes or started on medicines,   Your medication(s) may need to be changed.  Other problems may need to be addressed.  Be certain you understand your prescriptions, and know how and when to take your medicine.  Be sure to follow up with your caregiver within the time frame advised (usually within two weeks) to have your blood pressure rechecked and to review your medications.  If you are taking more than one medicine to lower your blood pressure, make sure you know how and at what times they should be taken. Taking two medicines at the same time can result in blood pressure that is too low. SEEK IMMEDIATE MEDICAL CARE IF:  You develop a severe headache, blurred or changing vision, or confusion.  You have unusual weakness or numbness, or  a faint feeling.  You have severe chest or abdominal pain, vomiting, or breathing problems. MAKE SURE YOU:   Understand these instructions.  Will watch your condition.  Will get help right away if you are not doing well or get worse. Document Released: 07/14/2005 Document Revised: 10/06/2011 Document Reviewed: 03/03/2008 [facility] Patient Information 2014 [place].

## 2013-10-07 NOTE — Assessment & Plan Note (Signed)
EGD November 2012 with hiatal hernia Denies active reflux symptoms, reports taking PPI for control of chronic cough which has improved  Patient request decrease in dose as tolerated, decreased from 40 mg to 20 mg daily at this time, further future wean as tolerated

## 2013-10-07 NOTE — Progress Notes (Signed)
Subjective:    Patient ID: [name] [name], female    DOB: [birth year], 73 y.o.   MRN: [id number]  HPI  New pt to me, here to establish care - transfer from [facility] chronic medical issues today:  hypertension - the patient reports compliance with medication(s) as prescribed. Denies adverse side effects.  Dyslipidemia - on statin - the patient reports compliance with medication(s) as prescribed. Denies adverse side effects.  GERD - on chronic PPI - the patient reports compliance with medication(s) as prescribed. Denies adverse side effects. Would like to wean off same as "cough" initiating therapy gas resolved    Past Medical History  Diagnosis Date  . Obesity, unspecified   . Esophageal reflux   . Diverticulosis of colon (without mention of hemorrhage)   . Diaphragmatic hernia without mention of obstruction or gangrene   . Unspecified hemorrhoids with other complication   . Irritable bowel syndrome   . Benign neoplasm of colon   . Osteoporosis, unspecified   . Gallstones   . Hyperlipidemia   . Hypertension   . History of chicken pox   . Migraines    Family History  Problem Relation Age of Onset  . Heart disease Sister     x 2  . Heart disease Brother   . Stroke Mother   . Colon cancer Sister    History  Substance Use Topics  . Smoking status: Never Smoker   . Smokeless tobacco: Never Used  . Alcohol Use: No    Review of Systems  Constitutional: Negative for fatigue and unexpected weight change.  Respiratory: Negative for cough, shortness of breath and wheezing.   Cardiovascular: Negative for chest pain, palpitations and leg swelling.  Gastrointestinal: Negative for nausea, abdominal pain and diarrhea.  Neurological: Negative for dizziness, weakness, light-headedness and headaches.  Psychiatric/Behavioral: Negative for dysphoric mood. The patient is not nervous/anxious.   All other systems reviewed and are negative.       Objective:   Physical Exam  BP  122/84  Pulse 63  Temp(Src) 98.2 F (36.8 C) (Oral)  Ht 5' (1.524 m)  Wt 151 lb 1.9 oz (68.548 kg)  BMI 29.51 kg/m2  SpO2 98% Wt Readings from Last 3 Encounters:  10/07/13 151 lb 1.9 oz (68.548 kg)  04/20/13 156 lb 3.2 oz (70.852 kg)  03/09/13 154 lb (69.854 kg)   Constitutional: She appears well-developed and well-nourished. No distress.  HENT: Head: Normocephalic and atraumatic. Ears: B TMs ok, no erythema or effusion; Nose: Nose normal. Mouth/Throat: Oropharynx is clear and moist. No oropharyngeal exudate.  Eyes: Conjunctivae and EOM are normal. Pupils are equal, round, and reactive to light. No scleral icterus.  Neck: Normal range of motion. Neck supple. No JVD present. No thyromegaly present.  Cardiovascular: Normal rate, regular rhythm and normal heart sounds.  No murmur heard. No BLE edema. Pulmonary/Chest: Effort normal and breath sounds normal. No respiratory distress. She has no wheezes.  Abdominal: Soft. Bowel sounds are normal. She exhibits no distension. There is no tenderness. no masses Musculoskeletal: Normal range of motion, no joint effusions. No gross deformities Neurological: She is alert and oriented to person, place, and time. No cranial nerve deficit. Coordination, balance, strength, speech and gait are normal.  Skin: Skin is warm and dry. No rash noted. No erythema.  Psychiatric: She has a normal mood and affect. Her behavior is normal. Judgment and thought content normal.    Lab Results  Component Value Date   WBC 5.5 03/09/2013  HGB 13.4 03/09/2013   HCT 40.1 03/09/2013   PLT 219.0 03/09/2013   GLUCOSE 103* 03/09/2013   CHOL 199 03/09/2013   TRIG 187.0* 03/09/2013   HDL 49.80 03/09/2013   LDLDIRECT 126.6 10/29/2010   LDLCALC 112* 03/09/2013   ALT 18 03/09/2013   AST 20 03/09/2013   NA 141 03/09/2013   K 3.8 03/09/2013   CL 104 03/09/2013   CREATININE 0.7 03/09/2013   BUN 13 03/09/2013   CO2 32 03/09/2013   TSH 3.28 03/09/2013    Mm Digital Screening 3d  Tom[facility]  05/31/2013   CLINICAL DATA:  Screening.  EXAM: DIGITAL SCREENING BILATERAL MAMMOGRAM WITH 3D TOMO WITH CAD  COMPARISON:  Previous exam(s).  ACR Breast Density Category c: The breasts are heterogeneously dense, which may obscure small masses.  FINDINGS: There are no findings suspicious for malignancy. Images were processed with CAD.  IMPRESSION: No mammographic evidence of malignancy. A result letter of this screening mammogram will be mailed directly to the patient.  RECOMMENDATION: Screening mammogram in one year. (Code:SM-B-01Y)  BI-RADS CATEGORY  1: Negative  Please Insert Correct Template   Electronically Signed   By: Evangeline Dakin M.D.   On: 05/31/2013 10:57       Assessment & Plan:   Problem List Items Addressed This Visit   G E R D     EGD November 2012 with hiatal hernia Denies active reflux symptoms, reports taking PPI for control of chronic cough which has improved  Patient request decrease in dose as tolerated, decreased from 40 mg to 20 mg daily at this time, further future wean as tolerated    Relevant Medications      omeprazole (PRILOSEC) capsule   HYPERLIPIDEMIA     On statin for same - various statin trials over years reviewed Goal LDL <130 Last lipids reviewed and at goal, check annually and adjust as needed The current medical regimen is effective;  continue present plan and medications.     Relevant Medications      simvastatin (ZOCOR) tablet      bisoprolol-hydrochlorothiazide (ZIAC) 5-6.25 MG per tablet   Hypertension - Primary      BP Readings from Last 3 Encounters:  10/07/13 122/84  04/20/13 120/72  03/09/13 132/72   Treated for palpitations, controlled with beta blocker ( remotely Inderal, and now bisproprolol combo) The current medical regimen is effective;  continue present plan and medications.        Time spent with pt today 45 minutes, greater than 50% time spent counseling patient on hypertension, lipids, GERD and medication review. Also  review of prior records

## 2013-10-07 NOTE — Assessment & Plan Note (Signed)
BP Readings from Last 3 Encounters:  10/07/13 122/84  04/20/13 120/72  03/09/13 132/72   Treated for palpitations, controlled with beta blocker ( remotely Inderal, and now bisproprolol combo) The current medical regimen is effective;  continue present plan and medications.

## 2013-10-07 NOTE — Progress Notes (Signed)
Pre visit review using our clinic review tool, if applicable. No additional management support is needed unless otherwise documented below in the visit note. 

## 2013-10-07 NOTE — Assessment & Plan Note (Signed)
On statin for same - various statin trials over years reviewed Goal LDL <130 Last lipids reviewed and at goal, check annually and adjust as needed The current medical regimen is effective;  continue present plan and medications.

## 2013-10-10 ENCOUNTER — Telehealth: Payer: Self-pay | Admitting: Internal Medicine

## 2013-10-10 NOTE — Telephone Encounter (Signed)
Relevant patient education mailed to patient.  

## 2014-03-29 ENCOUNTER — Ambulatory Visit: Payer: Medicare HMO | Admitting: Internal Medicine

## 2014-05-09 ENCOUNTER — Other Ambulatory Visit (HOSPITAL_COMMUNITY): Payer: Self-pay | Admitting: Obstetrics and Gynecology

## 2014-05-09 DIAGNOSIS — Z1231 Encounter for screening mammogram for malignant neoplasm of breast: Secondary | ICD-10-CM

## 2014-06-01 ENCOUNTER — Ambulatory Visit (HOSPITAL_COMMUNITY)
Admission: RE | Admit: 2014-06-01 | Discharge: 2014-06-01 | Disposition: A | Discharge status: Home / Self Care | Payer: Medicare HMO | Source: Ambulatory Visit | Attending: Obstetrics and Gynecology | Admitting: Obstetrics and Gynecology

## 2014-06-01 DIAGNOSIS — Z1231 Encounter for screening mammogram for malignant neoplasm of breast: Secondary | ICD-10-CM | POA: Diagnosis present

## 2014-06-08 ENCOUNTER — Ambulatory Visit: Payer: Medicare HMO | Admitting: Internal Medicine

## 2014-06-09 ENCOUNTER — Ambulatory Visit (INDEPENDENT_AMBULATORY_CARE_PROVIDER_SITE_OTHER): Payer: Medicare HMO | Admitting: Family Medicine

## 2014-06-09 ENCOUNTER — Telehealth: Payer: Self-pay | Admitting: Internal Medicine

## 2014-06-09 ENCOUNTER — Encounter: Payer: Self-pay | Admitting: Family Medicine

## 2014-06-09 ENCOUNTER — Ambulatory Visit (INDEPENDENT_AMBULATORY_CARE_PROVIDER_SITE_OTHER)
Admission: RE | Admit: 2014-06-09 | Discharge: 2014-06-09 | Disposition: A | Discharge status: Home / Self Care | Payer: Medicare HMO | Source: Ambulatory Visit | Attending: Family Medicine | Admitting: Family Medicine

## 2014-06-09 VITALS — BP 119/62 | HR 73 | Temp 98.4°F | Ht 60.0 in | Wt 153.0 lb

## 2014-06-09 DIAGNOSIS — S20211A Contusion of right front wall of thorax, initial encounter: Secondary | ICD-10-CM

## 2014-06-09 DIAGNOSIS — R0781 Pleurodynia: Secondary | ICD-10-CM

## 2014-06-09 MED ORDER — HYDROCODONE-ACETAMINOPHEN 10-325 MG PO TABS: 1.0000 | ORAL_TABLET | Freq: Four times a day (QID) | ORAL | Status: DC | PRN

## 2014-06-09 NOTE — Telephone Encounter (Signed)
Patient Information:  Caller Name: Trevia  Phone: [phone]  Patient: [name] [name]  Gender: Female  DOB: 1940/08/12  Age: 73 Years  PCP: Gwendolyn Grant (Adults only)  Office Follow Up:  Does the office need to follow up with this patient?: No  Instructions For The Office: N/A   Symptoms  Reason For Call & Symptoms: Was bending over chest freezer moving some turkeys on Monday 11/9 and felt discomfort in right front-side lower rib area.  Discomfort was mild Monday and Tuesday 11/10  unless bending over.  Yesterday 11/12 pain became worse when bending over working in [employer] and last night 11/12 when turning over feeling like something gouging into rib area  Reviewed Health History In EMR: Yes  Reviewed Medications In EMR: Yes  Reviewed Allergies In EMR: Yes  Reviewed Surgeries / Procedures: Yes  Date of Onset of Symptoms: 06/05/2014  Treatments Tried: Tylenol  Treatments Tried Worked: No  Guideline(s) Used:  Chest Injury  Disposition Per Guideline:   See Today in Office  Reason For Disposition Reached:   Patient wants to be seen  Advice Given:  N/A  Patient Will Follow Care Advice:  YES  Appointment Scheduled:  06/09/2014 13:30:00 Appointment Scheduled Provider:  Alysia Penna at [place] office

## 2014-06-09 NOTE — Progress Notes (Signed)
Pre visit review using our clinic review tool, if applicable. No additional management support is needed unless otherwise documented below in the visit note. 

## 2014-06-09 NOTE — Progress Notes (Signed)
   Subjective:    Patient ID: [name] [name], female    DOB: December 31, 1940, 73 y.o.   MRN: [id number]  HPI Here for pain in the right ribs which started 4 days ago after she leaned over a freezer at a shopping center. Her ribs were bent over the freezer wall but she did not fall. She has only mild pain for 2 days but yesterday when she awoke she had sharp pains in this area. No SOB but it hurts to take a deep breath or to bend over. Using Tylenol.    Review of Systems  Constitutional: Negative.   Respiratory: Negative.   Cardiovascular: Positive for chest pain. Negative for palpitations and leg swelling.       Objective:   Physical Exam  Constitutional: She appears well-developed and well-nourished.  Cardiovascular: Normal rate, regular rhythm, normal heart sounds and intact distal pulses.   Pulmonary/Chest: Effort normal and breath sounds normal. No respiratory distress. She has no wheezes. She has no rales.  Very tender over the right lateral ribs, no crepitus and no ecchymosis           Assessment & Plan:  Probable cracked rib. Get Xrays today. Rest, heat, Norco prn.

## 2014-06-13 ENCOUNTER — Telehealth: Payer: Self-pay | Admitting: Family

## 2014-06-13 ENCOUNTER — Encounter: Payer: Self-pay | Admitting: Family

## 2014-06-13 ENCOUNTER — Ambulatory Visit (INDEPENDENT_AMBULATORY_CARE_PROVIDER_SITE_OTHER): Payer: Medicare HMO | Admitting: Family

## 2014-06-13 ENCOUNTER — Other Ambulatory Visit (INDEPENDENT_AMBULATORY_CARE_PROVIDER_SITE_OTHER): Payer: Medicare HMO

## 2014-06-13 ENCOUNTER — Ambulatory Visit (INDEPENDENT_AMBULATORY_CARE_PROVIDER_SITE_OTHER): Payer: Medicare HMO

## 2014-06-13 VITALS — BP 144/92 | HR 62 | Temp 98.2°F | Resp 18 | Ht 60.0 in | Wt 153.8 lb

## 2014-06-13 DIAGNOSIS — S20219A Contusion of unspecified front wall of thorax, initial encounter: Secondary | ICD-10-CM | POA: Insufficient documentation

## 2014-06-13 DIAGNOSIS — S20211D Contusion of right front wall of thorax, subsequent encounter: Secondary | ICD-10-CM

## 2014-06-13 DIAGNOSIS — E785 Hyperlipidemia, unspecified: Secondary | ICD-10-CM

## 2014-06-13 DIAGNOSIS — I10 Essential (primary) hypertension: Secondary | ICD-10-CM

## 2014-06-13 DIAGNOSIS — S298XXA Other specified injuries of thorax, initial encounter: Secondary | ICD-10-CM | POA: Insufficient documentation

## 2014-06-13 DIAGNOSIS — Z23 Encounter for immunization: Secondary | ICD-10-CM

## 2014-06-13 LAB — CBC
HEMATOCRIT: 40.9 % (ref 36.0–46.0)
Hemoglobin: 13.9 g/dL (ref 12.0–15.0)
MCHC: 33.9 g/dL (ref 30.0–36.0)
MCV: 85.9 fl (ref 78.0–100.0)
PLATELETS: 239 10*3/uL (ref 150.0–400.0)
RBC: 4.76 Mil/uL (ref 3.87–5.11)
RDW: 13.4 % (ref 11.5–15.5)
WBC: 7.1 10*3/uL (ref 4.0–10.5)

## 2014-06-13 LAB — BASIC METABOLIC PANEL
BUN: 14 mg/dL (ref 6–23)
CO2: 23 mEq/L (ref 19–32)
Calcium: 9.7 mg/dL (ref 8.4–10.5)
Chloride: 107 mEq/L (ref 96–112)
Creatinine, Ser: 0.8 mg/dL (ref 0.4–1.2)
GFR: 80.02 mL/min (ref >60.00)
Glucose, Bld: 89 mg/dL (ref 70–99)
Potassium: 4.6 mEq/L (ref 3.5–5.1)
Sodium: 143 mEq/L (ref 135–145)

## 2014-06-13 LAB — LIPID PANEL
Cholesterol: 274 mg/dL — ABNORMAL HIGH (ref 0–200)
HDL: 55.7 mg/dL (ref >39.00)
LDL CALC: 183 mg/dL — AB (ref 0–99)
NonHDL: 218.3
TRIGLYCERIDES: 175 mg/dL — AB (ref 0.0–149.0)
Total CHOL/HDL Ratio: 5
VLDL: 35 mg/dL (ref 0.0–40.0)

## 2014-06-13 LAB — HEPATIC FUNCTION PANEL
ALT: 16 U/L (ref 0–35)
AST: 21 U/L (ref 0–37)
Albumin: 4.4 g/dL (ref 3.5–5.2)
Alkaline Phosphatase: 51 U/L (ref 39–117)
BILIRUBIN DIRECT: 0.1 mg/dL (ref 0.0–0.3)
BILIRUBIN TOTAL: 1 mg/dL (ref 0.2–1.2)
TOTAL PROTEIN: 7.8 g/dL (ref 6.0–8.3)

## 2014-06-13 LAB — TSH: TSH: 3.21 u[IU]/mL (ref 0.35–4.50)

## 2014-06-13 MED ORDER — SIMVASTATIN 40 MG PO TABS: 40.0000 mg | ORAL_TABLET | Freq: Every day | ORAL | Status: DC

## 2014-06-13 NOTE — Telephone Encounter (Signed)
Please call the patient and inform her that her labs were mostly with the expected ranges with the exception of her cholesterol. Her total cholesterol was 274 (goal <200) and her bad cholesterol was 183 (goal <100). Therefore I have increased her simvastatin at her pharmacy to 40 mg. Please instruct her to let us know if she develops any muscle pain or weakness with the new dose.

## 2014-06-13 NOTE — Assessment & Plan Note (Signed)
Stable on simvastatin. Obtain lipid profile and hepatic panel. Continue current simvastatin.

## 2014-06-13 NOTE — Assessment & Plan Note (Signed)
Stable with no dyspnea. Continue taking OTC NSAIDs as needed for pain. Discussed good lifting technique and potential use of a binder/splint for pain relief. Follow up if symptoms worsen or fail to improve.

## 2014-06-13 NOTE — Progress Notes (Signed)
Subjective:    Patient ID: [name] [name], female    DOB: Apr 24, 1941, 73 y.o.   MRN: [id number]  Chief Complaint  Patient presents with  . Follow-up    has a pain in her right side that she has had since bending over at a grocery store   HPI:  [name] [name] is a 73 y.o. female who presents today for follow up. Was recently seen by Dr. Sarajane Jews for a rib contusion. Indicates it started about 8 days ago when she was leaning over a freezer at a shopping center. Indicates it started our with mild pain and progressed to sharp pains. X-rays performed on 11/13 were negative for fracture.   1) Rib pain - Pain occurs when bending over, laying down and getting up, getting in and out of the car. Movement is what makes it worse. Coughing, sneezing and laughing also irritates it. When upright there is no pain. Currently taking Tylenol which helps with the pain. Not taking the Norco at this time. Denies any dyspnea or chest pain.   2) Hypertension - currently maintained on bisoprolol-HCTZ. Denies any edema, chest pain/discomfort, shortness of breath, or changes in vision.  BP Readings from Last 3 Encounters:  06/13/14 144/92  06/09/14 119/62  10/07/13 122/84   3) Hyperlipidemia - currently maintained on simvastatin. Denies any muscle pain or other side effects.   Lab Results  Component Value Date   CHOL 199 03/09/2013   HDL 49.80 03/09/2013   LDLCALC 112* 03/09/2013   LDLDIRECT 126.6 10/29/2010   TRIG 187.0* 03/09/2013   CHOLHDL 4 03/09/2013   No Known Allergies  Current Outpatient Prescriptions on File Prior to Visit  Medication Sig Dispense Refill  . acetaminophen (TYLENOL) 325 MG tablet Take 650 mg by mouth every 4 (four) hours as needed.    [name] aspirin 81 MG EC tablet Take 81 mg by mouth daily. Ecotrin    . bisoprolol-hydrochlorothiazide (ZIAC) 5-6.25 MG per tablet Take 1 tablet by mouth daily. 90 tablet 3  . calcium-vitamin D (OSCAL WITH D) 500-200 MG-UNIT per tablet Take 1 tablet by mouth 2  (two) times daily.     [name] HYDROcodone-acetaminophen (NORCO) 10-325 MG per tablet Take 1 tablet by mouth every 6 (six) hours as needed for severe pain. 60 tablet 0  . ibuprofen (ADVIL,MOTRIN) 200 MG tablet Take 600 mg by mouth 3 (three) times daily. With meals    . Methylcellulose, Laxative, (CITRUCEL PO) Take 1 scoop by mouth daily. 1 scoop in large glass of juice    . Multiple Vitamins-Minerals (CENTRUM SILVER PO) Take by mouth daily.      [name] nystatin cream (MYCOSTATIN) Apply 1 application topically 2 (two) times daily as needed.    [name] omeprazole (PRILOSEC) 20 MG capsule Take 1 capsule (20 mg total) by mouth daily. 90 capsule 3  . simvastatin (ZOCOR) 20 MG tablet Take 1 tablet (20 mg total) by mouth at bedtime. 90 tablet 3   No current facility-administered medications on file prior to visit.   Past Medical History  Diagnosis Date  . Obesity, unspecified   . Esophageal reflux   . Diverticulosis of colon (without mention of hemorrhage)   . Diaphragmatic hernia without mention of obstruction or gangrene   . Unspecified hemorrhoids with other complication   . Irritable bowel syndrome   . Benign neoplasm of colon   . Osteoporosis, unspecified   . Gallstones   . Hyperlipidemia   . Hypertension   . History of chicken  pox   . Migraines     Review of Systems    See HPI  Objective:    BP 144/92 mmHg  Pulse 62  Temp(Src) 98.2 F (36.8 C) (Oral)  Resp 18  Ht 5' (1.524 m)  Wt 153 lb 12.8 oz (69.763 kg)  BMI 30.04 kg/m2  SpO2 95% Nursing note and vital signs reviewed.  Physical Exam  Constitutional: She is oriented to person, place, and time. She appears well-developed and well-nourished. No distress.  Cardiovascular: Normal rate, regular rhythm, normal heart sounds and intact distal pulses.   Pulmonary/Chest: Effort normal and breath sounds normal.  Musculoskeletal:  No obvious deformity, discoloration or edema of right ribs. Tenderness elicited over 7-8 rib and intercostal space.  (+) compression test   Neurological: She is alert and oriented to person, place, and time.  Skin: Skin is warm and dry.  Psychiatric: She has a normal mood and affect. Her behavior is normal. Judgment and thought content normal.       Assessment & Plan:

## 2014-06-13 NOTE — Assessment & Plan Note (Signed)
Stable on bisoprolol-HCTZ. Obtain BMET and CBC. Continue current bisoprolol-HCTZ.

## 2014-06-13 NOTE — Progress Notes (Signed)
Pre visit review using our clinic review tool, if applicable. No additional management support is needed unless otherwise documented below in the visit note. 

## 2014-06-13 NOTE — Patient Instructions (Signed)
Thank you for choosing [facility].  Summary/Instructions:  If your symptoms worsen or fail to improve, please contact our office for further instruction, or in case of emergency go directly to the emergency room at the closest medical facility.   Try taking ibuprofen or Aleve on a consistent basis for about a week. Alternate ice/heat as needed for pain. May use icy hot. Light stretching as tolerated. A splint may provide some relief. When bending - bend from the waist and use your legs.    Costochondritis Costochondritis, sometimes called Tietze syndrome, is a swelling and irritation (inflammation) of the tissue (cartilage) that connects your ribs with your breastbone (sternum). It causes pain in the chest and rib area. Costochondritis usually goes away on its own over time. It can take up to 6 weeks or longer to get better, especially if you are unable to limit your activities. CAUSES  Some cases of costochondritis have no known cause. Possible causes include:  Injury (trauma).  Exercise or activity such as lifting.  Severe coughing. SIGNS AND SYMPTOMS  Pain and tenderness in the chest and rib area.  Pain that gets worse when coughing or taking deep breaths.  Pain that gets worse with specific movements. DIAGNOSIS  Your health care provider will do a physical exam and ask about your symptoms. Chest X-rays or other tests may be done to rule out other problems. TREATMENT  Costochondritis usually goes away on its own over time. Your health care provider may prescribe medicine to help relieve pain. HOME CARE INSTRUCTIONS   Avoid exhausting physical activity. Try not to strain your ribs during normal activity. This would include any activities using chest, abdominal, and side muscles, especially if heavy weights are used.  Apply ice to the affected area for the first 2 days after the pain begins.  Put ice in a plastic bag.  Place a towel between your skin and the  bag.  Leave the ice on for 20 minutes, 2-3 times a day.  Only take over-the-counter or prescription medicines as directed by your health care provider. SEEK MEDICAL CARE IF:  You have redness or swelling at the rib joints. These are signs of infection.  Your pain does not go away despite rest or medicine. SEEK IMMEDIATE MEDICAL CARE IF:   Your pain increases or you are very uncomfortable.  You have shortness of breath or difficulty breathing.  You cough up blood.  You have worse chest pains, sweating, or vomiting.  You have a fever or persistent symptoms for more than 2-3 days.  You have a fever and your symptoms suddenly get worse. MAKE SURE YOU:   Understand these instructions.  Will watch your condition.  Will get help right away if you are not doing well or get worse. Document Released: 04/23/2005 Document Revised: 05/04/2013 Document Reviewed: 02/15/2013 [facility] Patient Information 2015 [place], [place]. This information is not intended to replace advice given to you by your health care provider. Make sure you discuss any questions you have with your health care provider.  Rib Contusion A rib contusion (bruise) can occur by a blow to the chest or by a fall against a hard object. Usually these will be much better in a couple weeks. If X-rays were taken today and there are no broken bones (fractures), the diagnosis of bruising is made. However, broken ribs may not show up for several days, or may be discovered later on a routine X-ray when signs of healing show up. If this happens to  you, it does not mean that something was missed on the X-ray, but simply that it did not show up on the first X-rays. Earlier diagnosis will not usually change the treatment. HOME CARE INSTRUCTIONS   Avoid strenuous activity. Be careful during activities and avoid bumping the injured ribs. Activities that pull on the injured ribs and cause pain should be avoided, if possible.  For the first day  or two, an ice pack used every 20 minutes while awake may be helpful. Put ice in a plastic bag and put a towel between the bag and the skin.  Eat a normal, well-balanced diet. Drink plenty of fluids to avoid constipation.  Take deep breaths several times a day to keep lungs free of infection. Try to cough several times a day. Splint the injured area with a pillow while coughing to ease pain. Coughing can help prevent pneumonia.  Wear a rib belt or binder only if told to do so by your caregiver. If you are wearing a rib belt or binder, you must do the breathing exercises as directed by your caregiver. If not used properly, rib belts or binders restrict breathing which can lead to pneumonia.  Only take over-the-counter or prescription medicines for pain, discomfort, or fever as directed by your caregiver. SEEK MEDICAL CARE IF:   You or your child has an oral temperature above 102 F (38.9 C).  Your baby is older than 3 months with a rectal temperature of 100.5 F (38.1 C) or higher for more than 1 day.  You develop a cough, with thick or bloody sputum. SEEK IMMEDIATE MEDICAL CARE IF:   You have difficulty breathing.  You feel sick to your stomach (nausea), have vomiting or belly (abdominal) pain.  You have worsening pain, not controlled with medications, or there is a change in the location of the pain.  You develop sweating or radiation of the pain into the arms, jaw or shoulders, or become light headed or faint.  You or your child has an oral temperature above 102 F (38.9 C), not controlled by medicine.  Your or your baby is older than 3 months with a rectal temperature of 102 F (38.9 C) or higher.  Your baby is 1 months old or younger with a rectal temperature of 100.4 F (38 C) or higher. MAKE SURE YOU:   Understand these instructions.  Will watch your condition.  Will get help right away if you are not doing well or get worse. Document Released: 04/08/2001 Document  Revised: 11/08/2012 Document Reviewed: 03/01/2008 [facility] Patient Information 2015 [place], [place]. This information is not intended to replace advice given to you by your health care provider. Make sure you discuss any questions you have with your health care provider.

## 2014-06-14 MED ORDER — EZETIMIBE 10 MG PO TABS: 10.0000 mg | ORAL_TABLET | Freq: Every day | ORAL | Status: DC

## 2014-06-14 NOTE — Telephone Encounter (Signed)
Let pt know that med was sent to pharmacy.

## 2014-06-14 NOTE — Telephone Encounter (Signed)
Please call patient and inform her I have sent a new cholesterol medication to her pharmacy called [facility].

## 2014-06-14 NOTE — Telephone Encounter (Signed)
Called pt with the results of her labs. Informed her that her cholesterol was elevated and per Marya Amsler to increase her simvastatin to 40mg . Pt stated that she has not taken her simvastatin for weeks which could be why her cholesterol was high. She hasn't taken it due to muscle pain. She requested to be put on a different cholesterol medication but she will take her simvastatin that she has left over in the mean time. Please advise.

## 2014-06-15 ENCOUNTER — Telehealth: Payer: Self-pay | Admitting: *Deleted

## 2014-06-15 MED ORDER — EZETIMIBE 10 MG PO TABS: 10.0000 mg | ORAL_TABLET | Freq: Every day | ORAL | Status: DC

## 2014-06-15 NOTE — Telephone Encounter (Signed)
Left msg on triage spoke with Lovena Le and she was going to send rx into my pharmacy. Wanting to know where rx was sent. Called pt no answer LMOM md had sent rx to [facility], but only a 30 day. Will resend med to [facility]...Johny Chess

## 2014-07-12 ENCOUNTER — Other Ambulatory Visit: Payer: Self-pay | Admitting: Internal Medicine

## 2014-10-31 ENCOUNTER — Telehealth: Payer: Self-pay | Admitting: Family

## 2014-10-31 ENCOUNTER — Encounter: Payer: Self-pay | Admitting: Family

## 2014-10-31 ENCOUNTER — Other Ambulatory Visit (INDEPENDENT_AMBULATORY_CARE_PROVIDER_SITE_OTHER): Payer: PPO

## 2014-10-31 ENCOUNTER — Ambulatory Visit (INDEPENDENT_AMBULATORY_CARE_PROVIDER_SITE_OTHER): Payer: PPO | Admitting: Family

## 2014-10-31 VITALS — BP 160/90 | HR 63 | Temp 98.3°F | Ht 61.0 in | Wt 151.8 lb

## 2014-10-31 DIAGNOSIS — Z0000 Encounter for general adult medical examination without abnormal findings: Secondary | ICD-10-CM | POA: Diagnosis not present

## 2014-10-31 DIAGNOSIS — E785 Hyperlipidemia, unspecified: Secondary | ICD-10-CM

## 2014-10-31 DIAGNOSIS — Z23 Encounter for immunization: Secondary | ICD-10-CM

## 2014-10-31 DIAGNOSIS — I10 Essential (primary) hypertension: Secondary | ICD-10-CM

## 2014-10-31 LAB — BASIC METABOLIC PANEL
BUN: 11 mg/dL (ref 6–23)
CHLORIDE: 102 meq/L (ref 96–112)
CO2: 31 meq/L (ref 19–32)
Calcium: 10 mg/dL (ref 8.4–10.5)
Creatinine, Ser: 0.71 mg/dL (ref 0.40–1.20)
GFR: 85.16 mL/min (ref >60.00)
Glucose, Bld: 102 mg/dL — ABNORMAL HIGH (ref 70–99)
Potassium: 4 mEq/L (ref 3.5–5.1)
Sodium: 139 mEq/L (ref 135–145)

## 2014-10-31 LAB — LIPID PANEL
CHOL/HDL RATIO: 3
CHOLESTEROL: 190 mg/dL (ref 0–200)
HDL: 55.3 mg/dL (ref >39.00)
LDL Cholesterol: 101 mg/dL — ABNORMAL HIGH (ref 0–99)
NonHDL: 134.7
Triglycerides: 170 mg/dL — ABNORMAL HIGH (ref 0.0–149.0)
VLDL: 34 mg/dL (ref 0.0–40.0)

## 2014-10-31 LAB — CBC
HCT: 42 % (ref 36.0–46.0)
Hemoglobin: 14.3 g/dL (ref 12.0–15.0)
MCHC: 33.9 g/dL (ref 30.0–36.0)
MCV: 85.8 fl (ref 78.0–100.0)
PLATELETS: 240 10*3/uL (ref 150.0–400.0)
RBC: 4.9 Mil/uL (ref 3.87–5.11)
RDW: 13.7 % (ref 11.5–15.5)
WBC: 7.1 10*3/uL (ref 4.0–10.5)

## 2014-10-31 LAB — TSH: TSH: 4.37 u[IU]/mL (ref 0.35–4.50)

## 2014-10-31 MED ORDER — OMEPRAZOLE 20 MG PO CPDR: 20.0000 mg | DELAYED_RELEASE_CAPSULE | Freq: Every day | ORAL | Status: DC

## 2014-10-31 MED ORDER — BISOPROLOL-HYDROCHLOROTHIAZIDE 5-6.25 MG PO TABS: 1.0000 | ORAL_TABLET | Freq: Every day | ORAL | Status: DC

## 2014-10-31 NOTE — Telephone Encounter (Signed)
Pt aware of results 

## 2014-10-31 NOTE — Telephone Encounter (Signed)
Please inform the patient that her labs are all within the normal limits and her cholesterol looks good, therefore I recommend she continue to take the simvastatin at this time. We can plan to follow up in about a month to check her blood pressure.

## 2014-10-31 NOTE — Assessment & Plan Note (Signed)
Reviewed and updated patient's medical, surgical, family and social history. Medications and allergies were also reviewed. Basic screenings for depression, activities of daily living, hearing, cognition and safety were performed. Provider list was updated and health plan was provided to the patient.  

## 2014-10-31 NOTE — Progress Notes (Addendum)
Subjective:   [name] [name] is a 74 y.o. female who presents for Medicare Annual (Subsequent) preventive examination. Educated regarding CHO and lipids; not taking zetia/ taking statin 40. Wants to lose 10 lbs MME: [place], FNP  Eyes exam in November Dentist Eula Listen;  Like more information regarding an advanced directive.  Diet - Averages 3 meals per day; fairly balanced with vegetables and not very much meat; eats towards the vegetarian style with "too many desserts". Denies caffeine intake.   Exercise - No structured exercise, however is active doing other things like ADLs.  Eye exam - Up to exam  Dental Exam - Up to exam.    Tobacco History  Smoking status  . Never Smoker   Smokeless tobacco  . Never Used     Counseling given: Not Answered   Past Medical History  Diagnosis Date  . Obesity, unspecified   . Esophageal reflux   . Diverticulosis of colon (without mention of hemorrhage)   . Diaphragmatic hernia without mention of obstruction or gangrene   . Unspecified hemorrhoids with other complication   . Irritable bowel syndrome   . Benign neoplasm of colon   . Osteoporosis, unspecified   . Gallstones   . Hyperlipidemia   . Hypertension   . History of chicken pox   . Migraines    Past Surgical History  Procedure Laterality Date  . Vaginal hysterectomy  1996  . Cholecystectomy  1989  . Appendectomy  1979   Family History  Problem Relation Age of Onset  . Heart disease Sister     x 2  . Heart disease Brother   . Stroke Mother   . Colon cancer Sister    History  Sexual Activity  . Sexual Activity: Not on file    Outpatient Encounter Prescriptions as of 10/31/2014  Medication Sig  . acetaminophen (TYLENOL) 325 MG tablet Take 650 mg by mouth every 4 (four) hours as needed.  [name] aspirin 81 MG EC tablet Take 81 mg by mouth daily. Ecotrin  . bisoprolol-hydrochlorothiazide (ZIAC) 5-6.25 MG per tablet TAKE 1 TABLET DAILY.  .  calcium-vitamin D (OSCAL WITH D) 500-200 MG-UNIT per tablet Take 1 tablet by mouth 2 (two) times daily.   [name] ezetimibe (ZETIA) 10 MG tablet Take 1 tablet (10 mg total) by mouth daily. (Patient not taking: Reported on 10/31/2014)  . HYDROcodone-acetaminophen (NORCO) 10-325 MG per tablet Take 1 tablet by mouth every 6 (six) hours as needed for severe pain.  [name] ibuprofen (ADVIL,MOTRIN) 200 MG tablet Take 600 mg by mouth 3 (three) times daily. With meals  . Methylcellulose, Laxative, (CITRUCEL PO) Take 1 scoop by mouth daily. 1 scoop in large glass of juice  . Multiple Vitamins-Minerals (CENTRUM SILVER PO) Take by mouth daily.    [name] nystatin cream (MYCOSTATIN) Apply 1 application topically 2 (two) times daily as needed.  [name] omeprazole (PRILOSEC) 20 MG capsule Take 1 capsule (20 mg total) by mouth daily.  . simvastatin (ZOCOR) 40 MG tablet Take 1 tablet (40 mg total) by mouth at bedtime.    Patient Care Team: Rowe Clack, MD as PCP - General (Internal Medicine) Ladene Artist, MD (Gastroenterology) Molli Posey, MD (Obstetrics and Gynecology) Luberta Mutter, MD (Ophthalmology) Josue Hector, MD (Cardiology)     Review of Systems  Constitutional: Denies fever, chills, fatigue, or significant weight gain/loss. HENT: Head: Denies headache or neck pain Ears: Denies changes in hearing, ringing in ears, earache, drainage Nose: Denies  discharge, stuffiness, itching, nosebleed, sinus pain Throat: Denies sore throat, hoarseness, dry mouth, sores, thrush Eyes: Denies loss/changes in vision, pain, redness, blurry/double vision, flashing lights Cardiovascular: Denies chest pain/discomfort, tightness, palpitations, shortness of breath with activity, difficulty lying down, swelling, sudden awakening with shortness of breath Respiratory: Denies shortness of breath, cough, sputum production, wheezing Gastrointestinal: Denies dysphasia, heartburn, change in appetite, nausea, change in bowel habits,  rectal bleeding, constipation, diarrhea, yellow skin or eyes Genitourinary: Denies frequency, urgency, burning/pain, blood in urine, incontinence, change in urinary strength. Musculoskeletal: Denies muscle/joint pain, stiffness, back pain, redness or swelling of joints, trauma Skin: Denies rashes, lumps, itching, dryness, color changes, or hair/nail changes Neurological: Denies dizziness, fainting, seizures, weakness, numbness, tingling, tremor Psychiatric - Denies nervousness, stress, depression or memory loss Endocrine: Denies heat or cold intolerance, sweating, frequent urination, excessive thirst, changes in appetite Hematologic: Denies ease of bruising or bleeding      Objective:    Vitals: BP 160/90 mmHg  Pulse 63  Temp(Src) 98.3 F (36.8 C) (Oral)  Ht 5\' 1"  (1.549 m)  Wt 151 lb 12 oz (68.833 kg)  BMI 28.69 kg/m2  SpO2 92%    Physical Exam  Constitutional: She is oriented to person, place, and time. No distress.  HENT:  Right Ear: External ear normal.  Left Ear: External ear normal.  Nose: Nose normal.  Mouth/Throat: Oropharynx is clear and moist.  Eyes: Conjunctivae and EOM are normal. Pupils are equal, round, and reactive to light.  Neck: Normal range of motion. Neck supple. No JVD present. No tracheal deviation present. No thyromegaly present.  Cardiovascular: Normal rate, regular rhythm, normal heart sounds and intact distal pulses.   Respiratory: Effort normal and breath sounds normal. No respiratory distress. She has no wheezes. She has no rales. She exhibits no tenderness.  GI: Soft. Bowel sounds are normal. She exhibits no distension and no mass. There is no tenderness. There is no rebound and no guarding.  Musculoskeletal: Normal range of motion.  Lymphadenopathy:    She has no cervical adenopathy.  Neurological: She is alert and oriented to person, place, and time. She has normal reflexes. No cranial nerve deficit. Coordination normal.  Skin: Skin is warm and  dry.  Psychiatric: She has a normal mood and affect. Her behavior is normal. Judgment and thought content normal.   Assessment:     Exercise Activities and Dietary recommendations    Goals    . Weight < 200 lb (90.719 kg)     Wants to lose 10lbs; would like to come off of some medication. Would like to stop taking chol. Educate appropriately       Activities of Daily Living In your present state of health, do you have any difficulty performing the following activities?:  Driving? No Managing money?  No Feeding yourself? No Getting from bed to chair? No Climbing a flight of stairs? No Preparing food and eating?: No Bathing or showering? No Getting dressed: No Getting to the toilet? No Using the toilet: No Moving around from place to place: No In the past year have you fallen or had a near fall?:No  Fall Risk Fall Risk  10/31/2014 06/09/2014  Falls in the past year? No No  Risk for fall due to : (No Data) -  Risk for fall due to (comments): States she is careful; aware of fall risk -   Depression Screen PHQ 2/9 Scores 10/31/2014 06/09/2014  PHQ - 2 Score 0 0     Cognitive Testing No  flowsheet data found.  Immunization History  Administered Date(s) Administered  . Influenza Split 05/28/2011, 06/23/2012  . Influenza Whole 05/25/2007, 05/31/2010  . Influenza,inj,Quad PF,36+ Mos 04/20/2013, 06/13/2014  . Pneumococcal Polysaccharide-23 10/07/2013  . Td 10/07/2013   Screening Tests Health Maintenance  Topic Date Due  . ZOSTAVAX  03/31/1999  . PNA vac Low Risk Adult (2 of 2 - PCV13) 10/08/2014  . INFLUENZA VACCINE  02/26/2015  . COLONOSCOPY  06/02/2016  . TETANUS/TDAP  10/08/2023  . DEXA SCAN  Completed      Plan:    During the course of the visit the patient was educated and counseled about the following appropriate screening and preventive services:   Vaccines to include Pneumoccal, Influenza, Hepatitis B, Td, Zostavax,  HCV  Electrocardiogram  Cardiovascular Disease  Colorectal cancer screening  Bone density screening  Diabetes screening  Glaucoma screening  Mammography/PAP  Nutrition counseling   Patient Instructions (the written plan) was given to the patient.   Wynetta Fines, RN  10/31/2014 Terri Piedra, FNP

## 2014-10-31 NOTE — Patient Instructions (Addendum)
Thank you for choosing [facility].  Summary/Instructions:  Please stop by the lab on the basement level of the building for your blood work. Your results will be released to MyChart (or called to you) after review, usually within 72hours after test completion. If any changes need to be made, you will be notified at that same time.  Health Maintenance Adopting a healthy lifestyle and getting preventive care can go a long way to promote health and wellness. Talk with your health care provider about what schedule of regular examinations is right for you. This is a good chance for you to check in with your provider about disease prevention and staying healthy. In between checkups, there are plenty of things you can do on your own. Experts have done a lot of research about which lifestyle changes and preventive measures are most likely to keep you healthy. Ask your health care provider for more information. WEIGHT AND DIET  Eat a healthy diet  Be sure to include plenty of vegetables, fruits, low-fat dairy products, and lean protein.  Do not eat a lot of foods high in solid fats, added sugars, or salt.  Get regular exercise. This is one of the most important things you can do for your health.  Most adults should exercise for at least 150 minutes each week. The exercise should increase your heart rate and make you sweat (moderate-intensity exercise).  Most adults should also do strengthening exercises at least twice a week. This is in addition to the moderate-intensity exercise.  Maintain a healthy weight  Body mass index (BMI) is a measurement that can be used to identify possible weight problems. It estimates body fat based on height and weight. Your health care provider can help determine your BMI and help you achieve or maintain a healthy weight.  For females 20 years of age and older:   A BMI below 18.5 is considered underweight.  A BMI of 18.5 to 24.9 is normal.  A BMI of 25  to 29.9 is considered overweight.  A BMI of 30 and above is considered obese.  Watch levels of cholesterol and blood lipids  You should start having your blood tested for lipids and cholesterol at 76 years of age, then have this test every 5 years.  You may need to have your cholesterol levels checked more often if:  Your lipid or cholesterol levels are high.  You are older than 76 years of age.  You are at high risk for heart disease.  CANCER SCREENING   Lung Cancer  Lung cancer screening is recommended for adults 55-80 years old who are at high risk for lung cancer because of a history of smoking.  A yearly low-dose CT scan of the lungs is recommended for people who:  Currently smoke.  Have quit within the past 15 years.  Have at least a 30-pack-year history of smoking. A pack year is smoking an average of one pack of cigarettes a day for 1 year.  Yearly screening should continue until it has been 15 years since you quit.  Yearly screening should stop if you develop a health problem that would prevent you from having lung cancer treatment.  Breast Cancer  Practice breast self-awareness. This means understanding how your breasts normally appear and feel.  It also means doing regular breast self-exams. Let your health care provider know about any changes, no matter how small.  If you are in your 20s or 30s, you should have a clinical breast exam (  exam (CBE) by a health care provider every 1-3 years as part of a regular health exam.  If you are 61 or older, have a CBE every year. Also consider having a breast X-ray (mammogram) every year.  If you have a family history of breast cancer, talk to your health care provider about genetic screening.  If you are at high risk for breast cancer, talk to your health care provider about having an MRI and a mammogram every year.  Breast cancer gene (BRCA) assessment is recommended for women who have family members with BRCA-related  cancers. BRCA-related cancers include:  Breast.  Ovarian.  Tubal.  Peritoneal cancers.  Results of the assessment will determine the need for genetic counseling and BRCA1 and BRCA2 testing. Cervical Cancer Routine pelvic examinations to screen for cervical cancer are no longer recommended for nonpregnant women who are considered low risk for cancer of the pelvic organs (ovaries, uterus, and vagina) and who do not have symptoms. A pelvic examination may be necessary if you have symptoms including those associated with pelvic infections. Ask your health care provider if a screening pelvic exam is right for you.   The Pap test is the screening test for cervical cancer for women who are considered at risk.  If you had a hysterectomy for a problem that was not cancer or a condition that could lead to cancer, then you no longer need Pap tests.  If you are older than 65 years, and you have had normal Pap tests for the past 10 years, you no longer need to have Pap tests.  If you have had past treatment for cervical cancer or a condition that could lead to cancer, you need Pap tests and screening for cancer for at least 20 years after your treatment.  If you no longer get a Pap test, assess your risk factors if they change (such as having a new sexual partner). This can affect whether you should start being screened again.  Some women have medical problems that increase their chance of getting cervical cancer. If this is the case for you, your health care provider may recommend more frequent screening and Pap tests.  The human papillomavirus (HPV) test is another test that may be used for cervical cancer screening. The HPV test looks for the virus that can cause cell changes in the cervix. The cells collected during the Pap test can be tested for HPV.  The HPV test can be used to screen women 64 years of age and older. Getting tested for HPV can extend the interval between normal Pap tests from  three to five years.  An HPV test also should be used to screen women of any age who have unclear Pap test results.  After 76 years of age, women should have HPV testing as often as Pap tests.  Colorectal Cancer  This type of cancer can be detected and often prevented.  Routine colorectal cancer screening usually begins at 76 years of age and continues through 76 years of age.  Your health care provider may recommend screening at an earlier age if you have risk factors for colon cancer.  Your health care provider may also recommend using home test kits to check for hidden blood in the stool.  A small camera at the end of a tube can be used to examine your colon directly (sigmoidoscopy or colonoscopy). This is done to check for the earliest forms of colorectal cancer.  Routine screening usually begins at age 67.  Direct examination of the colon should be repeated every 5-10 years through 75 years of age. However, you may need to be screened more often if early forms of precancerous polyps or small growths are found. Skin Cancer  Check your skin from head to toe regularly.  Tell your health care provider about any new moles or changes in moles, especially if there is a change in a mole's shape or color.  Also tell your health care provider if you have a mole that is larger than the size of a pencil eraser.  Always use sunscreen. Apply sunscreen liberally and repeatedly throughout the day.  Protect yourself by wearing long sleeves, pants, a wide-brimmed hat, and sunglasses whenever you are outside. HEART DISEASE, DIABETES, AND HIGH BLOOD PRESSURE   Have your blood pressure checked at least every 1-2 years. High blood pressure causes heart disease and increases the risk of stroke.  If you are between 55 years and 79 years old, ask your health care provider if you should take aspirin to prevent strokes.  Have regular diabetes screenings. This involves taking a blood sample to check  your fasting blood sugar level.  If you are at a normal weight and have a low risk for diabetes, have this test once every three years after 76 years of age.  If you are overweight and have a high risk for diabetes, consider being tested at a younger age or more often. PREVENTING INFECTION  Hepatitis B  If you have a higher risk for hepatitis B, you should be screened for this virus. You are considered at high risk for hepatitis B if:  You were born in a country where hepatitis B is common. Ask your health care provider which countries are considered high risk.  Your parents were born in a high-risk country, and you have not been immunized against hepatitis B (hepatitis B vaccine).  You have HIV or AIDS.  You use needles to inject street drugs.  You live with someone who has hepatitis B.  You have had sex with someone who has hepatitis B.  You get hemodialysis treatment.  You take certain medicines for conditions, including cancer, organ transplantation, and autoimmune conditions. Hepatitis C  Blood testing is recommended for:  Everyone born from 1945 through 1965.  Anyone with known risk factors for hepatitis C. Sexually transmitted infections (STIs)  You should be screened for sexually transmitted infections (STIs) including gonorrhea and chlamydia if:  You are sexually active and are younger than 76 years of age.  You are older than 76 years of age and your health care provider tells you that you are at risk for this type of infection.  Your sexual activity has changed since you were last screened and you are at an increased risk for chlamydia or gonorrhea. Ask your health care provider if you are at risk.  If you do not have HIV, but are at risk, it may be recommended that you take a prescription medicine daily to prevent HIV infection. This is called pre-exposure prophylaxis (PrEP). You are considered at risk if:  You are sexually active and do not regularly use  condoms or know the HIV status of your partner(s).  You take drugs by injection.  You are sexually active with a partner who has HIV. Talk with your health care provider about whether you are at high risk of being infected with HIV. If you choose to begin PrEP, you should first be tested for HIV. You should then be   tested every 3 months for as long as you are taking PrEP.  PREGNANCY   If you are premenopausal and you may become pregnant, ask your health care provider about preconception counseling.  If you may become pregnant, take 400 to 800 micrograms (mcg) of folic acid every day.  If you want to prevent pregnancy, talk to your health care provider about birth control (contraception). OSTEOPOROSIS AND MENOPAUSE   Osteoporosis is a disease in which the bones lose minerals and strength with aging. This can result in serious bone fractures. Your risk for osteoporosis can be identified using a bone density scan.  If you are 34 years of age or older, or if you are at risk for osteoporosis and fractures, ask your health care provider if you should be screened.  Ask your health care provider whether you should take a calcium or vitamin D supplement to lower your risk for osteoporosis.  Menopause may have certain physical symptoms and risks.  Hormone replacement therapy may reduce some of these symptoms and risks. Talk to your health care provider about whether hormone replacement therapy is right for you.  HOME CARE INSTRUCTIONS   Schedule regular health, dental, and eye exams.  Stay current with your immunizations.   Do not use any tobacco products including cigarettes, chewing tobacco, or electronic cigarettes.  If you are pregnant, do not drink alcohol.  If you are breastfeeding, limit how much and how often you drink alcohol.  Limit alcohol intake to no more than 1 drink per day for nonpregnant women. One drink equals 12 ounces of beer, 5 ounces of wine, or 1 ounces of hard  liquor.  Do not use street drugs.  Do not share needles.  Ask your health care provider for help if you need support or information about quitting drugs.  Tell your health care provider if you often feel depressed.  Tell your health care provider if you have ever been abused or do not feel safe at home. Document Released: 01/27/2011 Document Revised: 11/28/2013 Document Reviewed: 06/15/2013 [facility] Patient Information 2015 [place], [place]. This information is not intended to replace advice given to you by your health care provider. Make sure you discuss any questions you have with your health care provider.  Advance Directive Advance directives are the legal documents that allow you to make choices about your health care and medical treatment if you cannot speak for yourself. Advance directives are a way for you to communicate your wishes to family, friends, and health care providers. The specified people can then convey your decisions about end-of-life care to avoid confusion if you should become unable to communicate. Ideally, the process of discussing and writing advance directives should happen over time rather than making decisions all at once. Advance directives can be modified as your situation changes, and you can change your mind at any time, even after you have signed the advance directives. Each state has its own laws regarding advance directives. You may want to check with your health care provider, attorney, or state representative about the law in your state. Below are some examples of advance directives. LIVING WILL A living will is a set of instructions documenting your wishes about medical care when you cannot care for yourself. It is used if you become:  Terminally ill.  Incapacitated.  Unable to communicate.  Unable to make decisions. Items to consider in your living will include:  The use or non-use of life-sustaining equipment, such as dialysis machines and  breathing machines (  ventilators).  A do not resuscitate (DNR) order, which is the instruction not to use cardiopulmonary resuscitation (CPR) if breathing or heartbeat stops.  Tube feeding.  Withholding of food and fluids.  Comfort (palliative) care when the goal becomes comfort rather than a cure.  Organ and tissue donation. A living will does not give instructions about distribution of your money and property if you should pass away. It is advisable to seek the expert advice of a lawyer in drawing up a will regarding your possessions. Decisions about taxes, beneficiaries, and asset distribution will be legally binding. This process can relieve your family and friends of any burdens surrounding disputes or questions that may come up about the allocation of your assets. DO NOT RESUSCITATE (DNR) A do not resuscitate (DNR) order is a request to not have CPR in the event that your heart stops beating or you stop breathing. Unless given other instructions, a health care provider will try to help any patient whose heart has stopped or who has stopped breathing.  HEALTH CARE PROXY AND DURABLE POWER OF ATTORNEY FOR HEALTH CARE A health care proxy is a person (agent) appointed to make medical decisions for you if you cannot. Generally, people choose someone they know well and trust to represent their preferences when they can no longer do so. You should be sure to ask this person for agreement to act as your agent. An agent may have to exercise judgment in the event of a medical decision for which your wishes are not known. The durable power of attorney for health care is the legal document that names your health care proxy. Once written, it should be:  Signed.  Notarized.  Dated.  Copied.  Witnessed.  Incorporated into your medical record. You may also want to appoint someone to manage your financial affairs if you cannot. This is called a durable power of attorney for finances. It is a separate  legal document from the durable power of attorney for health care. You may choose the same person or someone different from your health care proxy to act as your agent in financial matters. Document Released: 10/21/2007 Document Revised: 07/19/2013 Document Reviewed: 12/01/2012 [facility] Patient Information 2015 [place], [place]. This information is not intended to replace advice given to you by your health care provider. Make sure you discuss any questions you have with your health care provider.  Fat and Cholesterol Control Diet Fat and cholesterol levels in your blood and organs are influenced by your diet. High levels of fat and cholesterol may lead to diseases of the heart, small and large blood vessels, gallbladder, liver, and pancreas. CONTROLLING FAT AND CHOLESTEROL WITH DIET Although exercise and lifestyle factors are important, your diet is key. That is because certain foods are known to raise cholesterol and others to lower it. The goal is to balance foods for their effect on cholesterol and more importantly, to replace saturated and trans fat with other types of fat, such as monounsaturated fat, polyunsaturated fat, and omega-3 fatty acids. On average, a person should consume no more than 15 to 17 g of saturated fat daily. Saturated and trans fats are considered "bad" fats, and they will raise LDL cholesterol. Saturated fats are primarily found in animal products such as meats, butter, and cream. However, that does not mean you need to give up all your favorite foods. Today, there are good tasting, low-fat, low-cholesterol substitutes for most of the things you like to eat. Choose low-fat or nonfat alternatives. Choose round  or loin cuts of red meat. These types of cuts are lowest in fat and cholesterol. Chicken (without the skin), fish, veal, and ground [place] breast are great choices. Eliminate fatty meats, such as hot dogs and salami. Even shellfish have little or no saturated fat. Have a 3 oz  (85 g) portion when you eat lean meat, poultry, or fish. Trans fats are also called "partially hydrogenated oils." They are oils that have been scientifically manipulated so that they are solid at room temperature resulting in a longer shelf life and improved taste and texture of foods in which they are added. Trans fats are found in stick margarine, some tub margarines, cookies, crackers, and baked goods.  When baking and cooking, oils are a great substitute for butter. The monounsaturated oils are especially beneficial since it is believed they lower LDL and raise HDL. The oils you should avoid entirely are saturated tropical oils, such as coconut and palm.  Remember to eat a lot from food groups that are naturally free of saturated and trans fat, including fish, fruit, vegetables, beans, grains (barley, rice, couscous, bulgur wheat), and pasta (without cream sauces).  IDENTIFYING FOODS THAT LOWER FAT AND CHOLESTEROL  Soluble fiber may lower your cholesterol. This type of fiber is found in fruits such as apples, vegetables such as broccoli, potatoes, and carrots, legumes such as beans, peas, and lentils, and grains such as barley. Foods fortified with plant sterols (phytosterol) may also lower cholesterol. You should eat at least 2 g per day of these foods for a cholesterol lowering effect.  Read package labels to identify low-saturated fats, trans fat free, and low-fat foods at the supermarket. Select cheeses that have only 2 to 3 g saturated fat per ounce. Use a heart-healthy tub margarine that is free of trans fats or partially hydrogenated oil. When buying baked goods (cookies, crackers), avoid partially hydrogenated oils. Breads and muffins should be made from whole grains (whole-wheat or whole oat flour, instead of "flour" or "enriched flour"). Buy non-creamy canned soups with reduced salt and no added fats.  FOOD PREPARATION TECHNIQUES  Never deep-fry. If you must fry, either stir-fry, which uses  very little fat, or use non-stick cooking sprays. When possible, broil, bake, or roast meats, and steam vegetables. Instead of putting butter or margarine on vegetables, use lemon and herbs, applesauce, and cinnamon (for squash and sweet potatoes). Use nonfat yogurt, salsa, and low-fat dressings for salads.  LOW-SATURATED FAT / LOW-FAT FOOD SUBSTITUTES Meats / Saturated Fat (g)  Avoid: Steak, marbled (3 oz/85 g) / 11 g  Choose: Steak, lean (3 oz/85 g) / 4 g  Avoid: Hamburger (3 oz/85 g) / 7 g  Choose: Hamburger, lean (3 oz/85 g) / 5 g  Avoid: Ham (3 oz/85 g) / 6 g  Choose: Ham, lean cut (3 oz/85 g) / 2.4 g  Avoid: Chicken, with skin, dark meat (3 oz/85 g) / 4 g  Choose: Chicken, skin removed, dark meat (3 oz/85 g) / 2 g  Avoid: Chicken, with skin, light meat (3 oz/85 g) / 2.5 g  Choose: Chicken, skin removed, light meat (3 oz/85 g) / 1 g Dairy / Saturated Fat (g)  Avoid: Whole milk (1 cup) / 5 g  Choose: Low-fat milk, 2% (1 cup) / 3 g  Choose: Low-fat milk, 1% (1 cup) / 1.5 g  Choose: Skim milk (1 cup) / 0.3 g  Avoid: Hard cheese (1 oz/28 g) / 6 g  Choose: Skim milk cheese (1 oz/28  g) / 2 to 3 g  Avoid: Pine, 4% fat (1 cup) / 6.5 g  Choose: Low-fat cottage cheese, 1% fat (1 cup) / 1.5 g  Avoid: Ice cream (1 cup) / 9 g  Choose: Sherbet (1 cup) / 2.5 g  Choose: Nonfat frozen yogurt (1 cup) / 0.3 g  Choose: Frozen fruit bar / trace  Avoid: Whipped cream (1 tbs) / 3.5 g  Choose: Nondairy whipped topping (1 tbs) / 1 g Condiments / Saturated Fat (g)  Avoid: Mayonnaise (1 tbs) / 2 g  Choose: Low-fat mayonnaise (1 tbs) / 1 g  Avoid: Butter (1 tbs) / 7 g  Choose: Extra light margarine (1 tbs) / 1 g  Avoid: Coconut oil (1 tbs) / 11.8 g  Choose: Olive oil (1 tbs) / 1.8 g  Choose: Corn oil (1 tbs) / 1.7 g  Choose: Safflower oil (1 tbs) / 1.2 g  Choose: Sunflower oil (1 tbs) / 1.4 g  Choose: Soybean oil (1 tbs) / 2.4 g  Choose: Canola oil (1 tbs)  / 1 g Document Released: 07/14/2005 Document Revised: 11/08/2012 Document Reviewed: 10/12/2013 [employer] Patient Information 2015 [place], [place]. This information is not intended to replace advice given to you by your health care provider. Make sure you discuss any questions you have with your health care provider.  Exercise to Lose Weight Exercise and a healthy diet may help you lose weight. Your doctor may suggest specific exercises. EXERCISE IDEAS AND TIPS  Choose low-cost things you enjoy doing, such as walking, bicycling, or exercising to workout videos.  Take stairs instead of the elevator.  Walk during your lunch break.  Park your car further away from work or school.  Go to a gym or an exercise class.  Start with 5 to 10 minutes of exercise each day. Build up to 30 minutes of exercise 4 to 6 days a week.  Wear shoes with good support and comfortable clothes.  Stretch before and after working out.  Work out until you breathe harder and your heart beats faster.  Drink extra water when you exercise.  Do not do so much that you hurt yourself, feel dizzy, or get very short of breath. Exercises that burn about 150 calories:  Running 1  miles in 15 minutes.  Playing volleyball for 45 to 60 minutes.  Washing and waxing a car for 45 to 60 minutes.  Playing touch football for 45 minutes.  Walking 1  miles in 35 minutes.  Pushing a stroller 1  miles in 30 minutes.  Playing basketball for 30 minutes.  Raking leaves for 30 minutes.  Bicycling 5 miles in 30 minutes.  Walking 2 miles in 30 minutes.  Dancing for 30 minutes.  Shoveling snow for 15 minutes.  Swimming laps for 20 minutes.  Walking up stairs for 15 minutes.  Bicycling 4 miles in 15 minutes.  Gardening for 30 to 45 minutes.  Jumping rope for 15 minutes.  Washing windows or floors for 45 to 60 minutes. Document Released: 08/16/2010 Document Revised: 10/06/2011 Document Reviewed:  08/16/2010 [facility] Patient Information 2015 [place], [place]. This information is not intended to replace advice given to you by your health care provider. Make sure you discuss any questions you have with your health care provider.

## 2014-10-31 NOTE — Assessment & Plan Note (Signed)
1) Anticipatory Guidance: Discussed importance of wearing a seatbelt while driving and not texting while driving; changing batteries in smoke detector at least once annually; wearing suntan lotion when outside; eating a balanced and moderate diet; getting physical activity at least 30 minutes per day.  2) Immunizations / Screenings / Labs:  Prevnar and Zostavax given today. All other immunizations are up to date per recommendations. All screenings are up to date per recommendations. Obtain CBC, BMET, Lipid profile and TSH.   Overall well exam. Patient has risk factors of obesity, hypertension and hypercholesterolemia. Goal for her obesity is to decrease her body weight by 5-10% through increasing nutrient density and increasing physical activity to 30 minutes most days of the week. Her hypertension appears to labile at this point. Given previous readings maintain current dosages of bisoprolol-hydrochlorothiazide. Hyperlipidemia is currently stable with simvastatin, will update pending lab work. Follow-up prevention exam in one year. Follow-up office visit pending lab work.

## 2014-10-31 NOTE — Progress Notes (Deleted)
   Subjective:    Patient ID: [name] [name], female    DOB: September 11, 1940, 74 y.o.   MRN: [id number]  HPI    Review of Systems     Objective:   Physical Exam        Assessment & Plan:

## 2014-12-11 ENCOUNTER — Ambulatory Visit: Payer: PPO | Admitting: *Deleted

## 2014-12-11 ENCOUNTER — Telehealth: Payer: Self-pay | Admitting: Family

## 2014-12-11 VITALS — BP 132/70

## 2014-12-11 DIAGNOSIS — I10 Essential (primary) hypertension: Secondary | ICD-10-CM

## 2014-12-11 NOTE — Telephone Encounter (Signed)
Please inform patient that her blood pressure is good and no changes are needed at this time. Please follow up as previously scheduled.

## 2014-12-11 NOTE — Telephone Encounter (Signed)
Pt aware of phone note below

## 2014-12-11 NOTE — Telephone Encounter (Signed)
Patient came in today for nurse visit to check BP.  She would like to know if she needs to make a follow up appointment.  Please advise.

## 2015-03-29 ENCOUNTER — Ambulatory Visit (INDEPENDENT_AMBULATORY_CARE_PROVIDER_SITE_OTHER): Payer: PPO | Admitting: Internal Medicine

## 2015-03-29 ENCOUNTER — Encounter: Payer: Self-pay | Admitting: Internal Medicine

## 2015-03-29 ENCOUNTER — Other Ambulatory Visit (INDEPENDENT_AMBULATORY_CARE_PROVIDER_SITE_OTHER): Payer: PPO

## 2015-03-29 VITALS — BP 122/74 | HR 74 | Temp 98.2°F | Ht 64.0 in | Wt 151.0 lb

## 2015-03-29 DIAGNOSIS — R110 Nausea: Secondary | ICD-10-CM

## 2015-03-29 DIAGNOSIS — E785 Hyperlipidemia, unspecified: Secondary | ICD-10-CM | POA: Diagnosis not present

## 2015-03-29 DIAGNOSIS — I10 Essential (primary) hypertension: Secondary | ICD-10-CM | POA: Diagnosis not present

## 2015-03-29 LAB — H. PYLORI ANTIBODY, IGG: H PYLORI IGG: NEGATIVE

## 2015-03-29 MED ORDER — EZETIMIBE 10 MG PO TABS: 10.0000 mg | ORAL_TABLET | Freq: Every day | ORAL | Status: DC

## 2015-03-29 MED ORDER — OMEPRAZOLE 20 MG PO CPDR: DELAYED_RELEASE_CAPSULE | ORAL | Status: DC

## 2015-03-29 NOTE — Patient Instructions (Signed)
OK to increase the prilosec to 40 mg per day  Please continue all other medications as before, and refills have been done if requested.  Please have the pharmacy call with any other refills you may need.  Please keep your appointments with your specialists as you may have planned  Please go to the LAB in the Basement (turn left off the elevator) for the tests to be done today - just the H pylori test  You will be contacted by phone if any changes need to be made immediately.  Otherwise, you will receive a letter about your results with an explanation, but please check with MyChart first.  Please remember to sign up for MyChart if you have not done so, as this will be important to you in the future with finding out test results, communicating by private email, and scheduling acute appointments online when needed.

## 2015-03-29 NOTE — Progress Notes (Signed)
Subjective:    Patient ID: [name] [name], female    DOB: 1940/09/27, 74 y.o.   MRN: [id number]  HPI  Here to f/u, has recurrent mild nausea, started one night that woke her up July 17, a day later had vomiting x 1 but none further, still with recurring nausea however off and on mild nagging daily nausea, ? Some better with eating at times, assoc with belching more than usual for her; no appetite loss, or wt loss Wt Readings from Last 3 Encounters:  03/29/15 151 lb (68.493 kg)  10/31/14 151 lb 12 oz (68.833 kg)  06/13/14 153 lb 12.8 oz (69.763 kg)  Does not take ibuprofen regularly or recently, No ETOH use. Had been on prilosec 40 mg to 20 mg several months ago, thinks maybe needs back to 40 mg.  No abd pain. Denies worsening dysphagia, or blood.  IBS seems stable for the most part with citrucel. Last colonoscopy x 4 yrs - due next yr,   Last EGD 2012 with gastric polpy, neg for malignancy., neg biopsy for H pylori as well.   Past Medical History  Diagnosis Date  . Obesity, unspecified   . Esophageal reflux   . Diverticulosis of colon (without mention of hemorrhage)   . Diaphragmatic hernia without mention of obstruction or gangrene   . Unspecified hemorrhoids with other complication   . Irritable bowel syndrome   . Benign neoplasm of colon   . Osteoporosis, unspecified   . Gallstones   . Hyperlipidemia   . Hypertension   . History of chicken pox   . Migraines    Past Surgical History  Procedure Laterality Date  . Vaginal hysterectomy  1996  . Cholecystectomy  1989  . Appendectomy  1979    reports that she has never smoked. She has never used smokeless tobacco. She reports that she does not drink alcohol or use illicit drugs. family history includes Colon cancer in her sister; Heart disease in her brother and sister; Stroke in her mother. No Known Allergies Current Outpatient Prescriptions on File Prior to Visit  Medication Sig Dispense Refill  . acetaminophen (TYLENOL) 325 MG  tablet Take 650 mg by mouth every 4 (four) hours as needed.    [name] aspirin 81 MG EC tablet Take 81 mg by mouth daily. Ecotrin    . bisoprolol-hydrochlorothiazide (ZIAC) 5-6.25 MG per tablet Take 1 tablet by mouth daily. 90 tablet 3  . calcium-vitamin D (OSCAL WITH D) 500-200 MG-UNIT per tablet Take 1 tablet by mouth 2 (two) times daily.     [name] HYDROcodone-acetaminophen (NORCO) 10-325 MG per tablet Take 1 tablet by mouth every 6 (six) hours as needed for severe pain. 60 tablet 0  . ibuprofen (ADVIL,MOTRIN) 200 MG tablet Take 600 mg by mouth 3 (three) times daily. With meals    . Methylcellulose, Laxative, (CITRUCEL PO) Take 1 scoop by mouth daily. 1 scoop in large glass of juice    . Multiple Vitamins-Minerals (CENTRUM SILVER PO) Take by mouth daily.      [name] nystatin cream (MYCOSTATIN) Apply 1 application topically 2 (two) times daily as needed.    . simvastatin (ZOCOR) 40 MG tablet Take 1 tablet (40 mg total) by mouth at bedtime. 90 tablet 3   No current facility-administered medications on file prior to visit.      Review of Systems  Constitutional: Negative for unusual diaphoresis or night sweats HENT: Negative for ringing in ear or discharge Eyes: Negative for double vision or  worsening visual disturbance.  Respiratory: Negative for choking and stridor.   Gastrointestinal: Negative for vomiting or other signifcant bowel change Genitourinary: Negative for hematuria or change in urine volume.  Musculoskeletal: Negative for other MSK pain or swelling Skin: Negative for color change and worsening wound.  Neurological: Negative for tremors and numbness other than noted  Psychiatric/Behavioral: Negative for decreased concentration or agitation other than above       Objective:   Physical Exam BP 122/74 mmHg  Pulse 74  Temp(Src) 98.2 F (36.8 C) (Oral)  Ht 5\' 4"  (1.626 m)  Wt 151 lb (68.493 kg)  BMI 25.91 kg/m2  SpO2 95% VS noted,  Constitutional: Pt appears in no significant  distress HENT: Head: NCAT.  Right Ear: External ear normal.  Left Ear: External ear normal.  Eyes: . Pupils are equal, round, and reactive to light. Conjunctivae and EOM are normal Neck: Normal range of motion. Neck supple.  Cardiovascular: Normal rate and regular rhythm.   Pulmonary/Chest: Effort normal and breath sounds without rales or wheezing.  Abd:  Soft, NT, ND, + BS Neurological: Pt is alert. Not confused , motor grossly intact Skin: Skin is warm. No rash, no LE edema Psychiatric: Pt behavior is normal. No agitation.     Assessment & Plan:

## 2015-03-29 NOTE — Progress Notes (Signed)
Pre visit review using our clinic review tool, if applicable. No additional management support is needed unless otherwise documented below in the visit note. 

## 2015-03-29 NOTE — Assessment & Plan Note (Signed)
stable overall by history and exam, recent data reviewed with pt, and pt to continue medical treatment as before,  to f/u any worsening symptoms or concerns BP Readings from Last 3 Encounters:  03/29/15 122/74  12/11/14 132/70  10/31/14 160/90

## 2015-03-29 NOTE — Assessment & Plan Note (Signed)
stable overall by history and exam, recent data reviewed with pt, and pt to continue medical treatment as before,  to f/u any worsening symptoms or concerns Lab Results  Component Value Date   LDLCALC 101* 10/31/2014   Pt had stopped the zetia recently due to knee pain but willing to restart now, has seen ortho for cortisone this am

## 2015-03-29 NOTE — Assessment & Plan Note (Signed)
?   Etiology, ok for h pylori serology, and increase prilosec to 40 qd, would consider referral back to GI for persistent symtpoms especially if becomes assoc with pain, blood or wt loss

## 2015-05-08 ENCOUNTER — Ambulatory Visit: Payer: PPO | Admitting: Internal Medicine

## 2015-06-19 ENCOUNTER — Other Ambulatory Visit: Payer: Self-pay

## 2015-06-19 DIAGNOSIS — Z1231 Encounter for screening mammogram for malignant neoplasm of breast: Secondary | ICD-10-CM

## 2015-06-27 ENCOUNTER — Ambulatory Visit (INDEPENDENT_AMBULATORY_CARE_PROVIDER_SITE_OTHER): Payer: PPO | Admitting: Internal Medicine

## 2015-06-27 ENCOUNTER — Ambulatory Visit (INDEPENDENT_AMBULATORY_CARE_PROVIDER_SITE_OTHER)
Admission: RE | Admit: 2015-06-27 | Discharge: 2015-06-27 | Disposition: A | Discharge status: Home / Self Care | Payer: PPO | Source: Ambulatory Visit | Attending: Internal Medicine | Admitting: Internal Medicine

## 2015-06-27 ENCOUNTER — Encounter: Payer: Self-pay | Admitting: Internal Medicine

## 2015-06-27 ENCOUNTER — Other Ambulatory Visit (INDEPENDENT_AMBULATORY_CARE_PROVIDER_SITE_OTHER): Payer: PPO

## 2015-06-27 VITALS — BP 118/84 | HR 61 | Temp 98.1°F | Ht 64.0 in | Wt 145.2 lb

## 2015-06-27 DIAGNOSIS — E785 Hyperlipidemia, unspecified: Secondary | ICD-10-CM

## 2015-06-27 DIAGNOSIS — M810 Age-related osteoporosis without current pathological fracture: Secondary | ICD-10-CM | POA: Diagnosis not present

## 2015-06-27 DIAGNOSIS — M170 Bilateral primary osteoarthritis of knee: Secondary | ICD-10-CM

## 2015-06-27 DIAGNOSIS — M5136 Other intervertebral disc degeneration, lumbar region: Secondary | ICD-10-CM | POA: Insufficient documentation

## 2015-06-27 DIAGNOSIS — Z23 Encounter for immunization: Secondary | ICD-10-CM | POA: Diagnosis not present

## 2015-06-27 DIAGNOSIS — I10 Essential (primary) hypertension: Secondary | ICD-10-CM | POA: Diagnosis not present

## 2015-06-27 DIAGNOSIS — M179 Osteoarthritis of knee, unspecified: Secondary | ICD-10-CM | POA: Insufficient documentation

## 2015-06-27 DIAGNOSIS — M51369 Other intervertebral disc degeneration, lumbar region without mention of lumbar back pain or lower extremity pain: Secondary | ICD-10-CM | POA: Insufficient documentation

## 2015-06-27 DIAGNOSIS — M1710 Unilateral primary osteoarthritis, unspecified knee: Secondary | ICD-10-CM | POA: Insufficient documentation

## 2015-06-27 LAB — LIPID PANEL
Cholesterol: 228 mg/dL — ABNORMAL HIGH (ref 0–200)
HDL: 47.6 mg/dL (ref >39.00)
NonHDL: 180.42
TRIGLYCERIDES: 202 mg/dL — AB (ref 0.0–149.0)
Total CHOL/HDL Ratio: 5
VLDL: 40.4 mg/dL — ABNORMAL HIGH (ref 0.0–40.0)

## 2015-06-27 LAB — LDL CHOLESTEROL, DIRECT: Direct LDL: 158 mg/dL

## 2015-06-27 NOTE — Assessment & Plan Note (Signed)
No longer taking statin due to concern for exacerbated MSkel symptoms (knees, back) reviewed various statin trials over years - all cause pain On Zetia since 10/2014 Goal LDL <130 Check lipids annually and adjust as needed

## 2015-06-27 NOTE — Assessment & Plan Note (Signed)
BP Readings from Last 3 Encounters:  06/27/15 118/84  03/29/15 122/74  12/11/14 132/70   Treated for palpitations, controlled with beta blocker (remotely Inderal, and now bisproprolol combo) The current medical regimen is effective;  continue present plan and medications.

## 2015-06-27 NOTE — Assessment & Plan Note (Signed)
Not taking hydrocodone due to constipation side effects, rare use of prescription dose ibuprofen Recommended scheduled Tylenol morning and evening Follow-up with [facility]o [facility] as ongoing, also offered local sports medicine for second opinion if desired Symptomatic care and appropriate exercises reviewed

## 2015-06-27 NOTE — Patient Instructions (Addendum)
It was good to see you today.  We have reviewed your prior records including labs and tests today  Your annual flu shot was given and/or updated today.  Test(s) ordered today - cholesterol and bone density. Your results will be released to [facility] (or called to you) after review, usually within 72hours after test completion. If any changes need to be made, you will be notified at that same time.  Medications reviewed and updated, no changes recommended at this time. Use Tylenol 2 each morning and 2 each evening for "regular" arthritis pain control - ok for hydrocodone or ibuprofen on more severe pain days  Please schedule followup in 4-6 months with new primary care doc for your annual/physical and labs, call sooner if problems.  Dr. Tamala Julian is available for second opinion on your knees if needed/desired

## 2015-06-27 NOTE — Progress Notes (Signed)
Pre visit review using our clinic review tool, if applicable. No additional management support is needed unless otherwise documented below in the visit note. 
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
## 2015-07-09 ENCOUNTER — Telehealth: Payer: Self-pay | Admitting: Internal Medicine

## 2015-07-09 ENCOUNTER — Telehealth: Payer: Self-pay

## 2015-07-09 NOTE — Telephone Encounter (Signed)
Noted on results

## 2015-07-09 NOTE — Telephone Encounter (Signed)
Pt is okay with trying the Prolia injections.  Can you check to see what her insurance will cover? Let me know if there is anything that I can assist you with.

## 2015-07-09 NOTE — Telephone Encounter (Signed)
Patient would like the results of her labs and bone density test.

## 2015-07-10 NOTE — Telephone Encounter (Signed)
I have electronically submitted pt's info for Prolia insurance verification and will notify you once I have a response. Thank you. °

## 2015-07-25 ENCOUNTER — Ambulatory Visit
Admission: RE | Admit: 2015-07-25 | Discharge: 2015-07-25 | Disposition: A | Discharge status: Home / Self Care | Payer: PPO | Source: Ambulatory Visit

## 2015-07-25 DIAGNOSIS — Z1231 Encounter for screening mammogram for malignant neoplasm of breast: Secondary | ICD-10-CM

## 2015-07-31 NOTE — Telephone Encounter (Signed)
Cecille Rubin, Can you call pt and ask to transfer and schedule Prolia.   Rose, do you know if the patient has ever had a Prolia injection. I reviewed the chart and do not see. Wanted to make sure that I did not miss anything.

## 2015-07-31 NOTE — Telephone Encounter (Signed)
I have rec'd pt's ins verification for Prolia and without an OV she will have an estimated responsibility of $195 co-insurance; w/an OV there there will be an additional co-pay of $15 for a total estimated responsibility of $210.  Please make pt aware this is an estimate and we will not know and exact amt until insurance(s) has/have paid. I have sent a copy of the summary of benefits to be scanned into pt's chart.    If pt cannot afford [phone] for her injection, please advise her to contact [employer] at [phone] and select option #1 to see if she qualifies for one of their assistance programs.  If she qualifies they will instruct her how to proceed.  If you have any questions, please let me know. Thank you.

## 2015-08-01 DIAGNOSIS — H5203 Hypermetropia, bilateral: Secondary | ICD-10-CM | POA: Diagnosis not present

## 2015-08-01 DIAGNOSIS — H26493 Other secondary cataract, bilateral: Secondary | ICD-10-CM | POA: Diagnosis not present

## 2015-08-01 DIAGNOSIS — H524 Presbyopia: Secondary | ICD-10-CM | POA: Diagnosis not present

## 2015-08-01 NOTE — Telephone Encounter (Signed)
Spoke with pt. She is going to follow up on the assistance program and call back to schedule

## 2015-08-01 NOTE — Telephone Encounter (Signed)
[name] [name], like you I do not see in the chart where patient has rec'd Prolia injection and she was not in the Prolia portal until I ran the verification on 07/10/2015.  I assume this is her 1st injection unless she has rec'd one somewhere else.  If she rec's the injection, please send me the actual injection date so I can update the Prolia portal.  Thank you.

## 2015-08-10 NOTE — Telephone Encounter (Signed)
I have scheduled her appt for the injection and this will be her 1st one.

## 2015-08-14 ENCOUNTER — Ambulatory Visit (INDEPENDENT_AMBULATORY_CARE_PROVIDER_SITE_OTHER): Payer: PPO

## 2015-08-14 ENCOUNTER — Telehealth: Payer: Self-pay

## 2015-08-14 DIAGNOSIS — M810 Age-related osteoporosis without current pathological fracture: Secondary | ICD-10-CM

## 2015-08-14 MED ORDER — DENOSUMAB 60 MG/ML ~~LOC~~ SOLN
60.0000 mg | Freq: Once | SUBCUTANEOUS | Status: AC
  Administered 2015-08-14: 60 mg via SUBCUTANEOUS

## 2015-08-14 NOTE — Telephone Encounter (Signed)
Patient came in today and received first prolia injection---should be coming in again in appx 6 months for the next injection---can you please verify with insurance and let me know---i will order med after you respond, thanks

## 2015-08-23 DIAGNOSIS — H26491 Other secondary cataract, right eye: Secondary | ICD-10-CM | POA: Diagnosis not present

## 2015-08-30 DIAGNOSIS — H26492 Other secondary cataract, left eye: Secondary | ICD-10-CM | POA: Diagnosis not present

## 2015-09-03 NOTE — Telephone Encounter (Signed)
Due July 18th, 2017

## 2015-09-19 ENCOUNTER — Ambulatory Visit: Payer: PPO | Admitting: Family Medicine

## 2015-09-24 ENCOUNTER — Ambulatory Visit: Payer: PPO | Admitting: Family Medicine

## 2015-10-03 ENCOUNTER — Encounter: Payer: Self-pay | Admitting: Family Medicine

## 2015-10-03 ENCOUNTER — Ambulatory Visit (INDEPENDENT_AMBULATORY_CARE_PROVIDER_SITE_OTHER): Payer: PPO | Admitting: Family Medicine

## 2015-10-03 ENCOUNTER — Ambulatory Visit (INDEPENDENT_AMBULATORY_CARE_PROVIDER_SITE_OTHER)
Admission: RE | Admit: 2015-10-03 | Discharge: 2015-10-03 | Disposition: A | Discharge status: Home / Self Care | Payer: PPO | Source: Ambulatory Visit | Attending: Family Medicine | Admitting: Family Medicine

## 2015-10-03 VITALS — BP 122/74 | HR 62 | Ht 64.0 in | Wt 146.0 lb

## 2015-10-03 DIAGNOSIS — M545 Low back pain, unspecified: Secondary | ICD-10-CM

## 2015-10-03 DIAGNOSIS — M4806 Spinal stenosis, lumbar region: Secondary | ICD-10-CM | POA: Diagnosis not present

## 2015-10-03 DIAGNOSIS — G8929 Other chronic pain: Secondary | ICD-10-CM

## 2015-10-03 DIAGNOSIS — M5136 Other intervertebral disc degeneration, lumbar region: Secondary | ICD-10-CM

## 2015-10-03 DIAGNOSIS — M48061 Spinal stenosis, lumbar region without neurogenic claudication: Secondary | ICD-10-CM

## 2015-10-03 DIAGNOSIS — M47816 Spondylosis without myelopathy or radiculopathy, lumbar region: Secondary | ICD-10-CM | POA: Diagnosis not present

## 2015-10-03 MED ORDER — GABAPENTIN 100 MG PO CAPS: 200.0000 mg | ORAL_CAPSULE | Freq: Every day | ORAL | Status: DC

## 2015-10-03 MED ORDER — VITAMIN D (ERGOCALCIFEROL) 1.25 MG (50000 UNIT) PO CAPS: 50000.0000 [IU] | ORAL_CAPSULE | ORAL | Status: DC

## 2015-10-03 NOTE — Assessment & Plan Note (Signed)
Severe in nature.  At this point I do think patient is having nerve impingement and I am concern for the bowel and bladder incontinence. Discussed with patient that if this worsens she needs to go and seek medical attention immediately. Patient is having pain that seems to go down the anterior aspect of her leg which means this could be the higher lumbar's. With patient's history of osteoporosis we'll get x-rays to rule out any type of compression fracture. We discussed continuing the bisphosphonate that she is getting injections. In addition of this patient was given once weekly vitamin D. Patient will be scheduled for another epidural at this time to see if this will be beneficial. We discussed icing regimen and patient will come back and see me 2 weeks after the epidural for further evaluation and treatment.

## 2015-10-03 NOTE — Progress Notes (Signed)
Pre visit review using our clinic review tool, if applicable. No additional management support is needed unless otherwise documented below in the visit note. 

## 2015-10-03 NOTE — Patient Instructions (Signed)
Good to see you  Xray downstairs today  I am concern the osteoporosis Vitamin D once weekly for next 12 weeks Gabapentin 100mg  at night for first week then 200mg  at night  Turmeric over the counter 500mg  once daily can help with the pain  Ice 20 minutes 2 times daily. Usually after activity and before bed. We are ordering an epidural  See me again 2 weeks after epidural.  If worsening bowel or bladder problems please go to the emergency room immediately.

## 2015-10-03 NOTE — Progress Notes (Signed)
[name] [address] [place], [place] [address] Phone: [phone] Subjective:    I'm seeing this patient by the request  of:  Gwendolyn Grant, MD   CC: low back pain   [id number] [name] [name] is a 75 y.o. female coming in with complaint of low back pain. Been going on for sometime. Having pain go down the legs bilaterally. Mild weakness. Doing less activity  Not going out as much./  Some bowel  In bladder incontinence. Patient does not know this is associated with her back or something different. It when she is at rest. Patient though states that it seems to be worsening over the course of time. Does have past pedicle history significant for severe osteoporosis.  Patient is a severity pain is 8 out of 10.    History shows the patient did have an MRI [facility] orth[facility]ics. This was inability visualized by me showing patient did have severe spinal stenosis at multiple levels. Patient was given an epidural and was given very minimal improvement. Patient states that a few years ago she was given another epidural that did help further for years.  Past Medical History  Diagnosis Date  . Obesity, unspecified   . Esophageal reflux   . Diverticulosis of colon (without mention of hemorrhage)   . Diaphragmatic hernia without mention of obstruction or gangrene   . Unspecified hemorrhoids with other complication   . Irritable bowel syndrome   . Benign neoplasm of colon   . Osteoporosis, unspecified   . Gallstones   . Hyperlipidemia   . Hypertension   . History of chicken pox   . Migraines   . DDD (degenerative disc disease), lumbar     MRI w. [facility] [facility] 05/2015, s/p ESI x 1 w/ improvement  . DJD (degenerative joint disease) of knee     L>R, IA cortisone by [facility] [facility] in L 03/2015   Past Surgical History  Procedure Laterality Date  . Vaginal hysterectomy  1996  . Cholecystectomy  1989  . Appendectomy  1979   Social History   Social History  .  Marital Status: Married    Spouse Name: N/A  . Number of Children: N/A  . Years of Education: N/A   Social History Main Topics  . Smoking status: Never Smoker   . Smokeless tobacco: Never Used  . Alcohol Use: No  . Drug Use: No  . Sexual Activity: Not Asked   Other Topics Concern  . None   Social History Narrative   No Known Allergies Family History  Problem Relation Age of Onset  . Heart disease Sister     x 2  . Heart disease Brother   . Stroke Mother   . Colon cancer Sister   . Stroke Sister 87    Past medical history, social, surgical and family history all reviewed in electronic medical record.  No pertanent information unless stated regarding to the chief complaint.   Review of Systems: No headache, visual changes, nausea, vomiting, diarrhea, constipation, dizziness, abdominal pain, skin rash, fevers, chills, night sweats, weight loss, swollen lymph nodes, body aches, joint swelling, muscle aches, chest pain, shortness of breath, mood changes.   Objective Blood pressure 122/74, pulse 62, height 5\' 4"  (1.626 m), weight 146 lb (66.225 kg), SpO2 92 %.  General: No apparent distress alert and oriented x3 mood and affect normal, dressed appropriately.  HEENT: Pupils equal, extraocular movements intact  Respiratory: Patient's speak in full sentences and does  not appear short of breath  Cardiovascular: No lower extremity edema, non tender, no erythema  Skin: Warm dry intact with no signs of infection or rash on extremities or on axial skeleton.  Abdomen: Soft nontender  Neuro: Cranial nerves II through XII are intact, neurovascularly intact in all extremities with 2+ DTRs and 2+ pulses.  Lymph: No lymphadenopathy of posterior or anterior cervical chain or axillae bilaterally.  Gait  Mild shuffling MSK:  Non tender with full range of motion and good stability and symmetric strength and tone of shoulders, elbows, wrist, hip, knee and ankles bilaterally.  Back Exam:    Inspection:  Increasing kyphosis Motion: Flexion 25 deg, Extension 25 deg, Side Bending to 35 deg bilaterally,  Rotation to 25 deg bilaterally  SLR laying:  Positive right XSLR laying: Negative  Palpable tenderness: tender to palpation of the paraspinale. Senso12ensation intact to all lumbar and sacral dermatomes.  Reflexes: 2+ at both patellar tendons, 2+ at achilles tendons, Babinski's downgoing.  Strength at foot  Plantar-flexion: 5/5 Dorsi-flexion: 5/5 Eversion: 5/5 Inversion: 5/5  Leg strength  Quad: 5/5 Hamstring: 4/5 Hip flexor: 4/5 Hip abductors: 4/5     Impression and Recommendations:     This case required medical decision making of moderate complexity.      Note: This dictation was prepared with Dragon dictation along with smaller phrase technology. Any transcriptional errors that result from this process are unintentional.

## 2015-10-12 ENCOUNTER — Ambulatory Visit
Admission: RE | Admit: 2015-10-12 | Discharge: 2015-10-12 | Disposition: A | Discharge status: Home / Self Care | Payer: PPO | Source: Ambulatory Visit | Attending: Family Medicine | Admitting: Family Medicine

## 2015-10-12 DIAGNOSIS — M545 Low back pain: Secondary | ICD-10-CM | POA: Diagnosis not present

## 2015-10-12 DIAGNOSIS — G8929 Other chronic pain: Secondary | ICD-10-CM

## 2015-10-12 MED ORDER — METHYLPREDNISOLONE ACETATE 40 MG/ML INJ SUSP (RADIOLOG
120.0000 mg | Freq: Once | INTRAMUSCULAR | Status: AC
  Administered 2015-10-12: 120 mg via EPIDURAL

## 2015-10-12 MED ORDER — IOHEXOL 180 MG/ML  SOLN
1.0000 mL | Freq: Once | INTRAMUSCULAR | Status: AC | PRN
  Administered 2015-10-12: 1 mL via EPIDURAL

## 2015-10-12 NOTE — Discharge Instructions (Signed)

## 2015-10-15 ENCOUNTER — Telehealth: Payer: Self-pay | Admitting: Family Medicine

## 2015-10-15 ENCOUNTER — Other Ambulatory Visit: Payer: Self-pay

## 2015-10-15 DIAGNOSIS — G8929 Other chronic pain: Secondary | ICD-10-CM

## 2015-10-15 DIAGNOSIS — M545 Low back pain, unspecified: Secondary | ICD-10-CM

## 2015-10-15 MED ORDER — VITAMIN D (ERGOCALCIFEROL) 1.25 MG (50000 UNIT) PO CAPS: 50000.0000 [IU] | ORAL_CAPSULE | ORAL | Status: DC

## 2015-10-15 MED ORDER — GABAPENTIN 100 MG PO CAPS: 200.0000 mg | ORAL_CAPSULE | Freq: Every day | ORAL | Status: DC

## 2015-10-15 NOTE — Telephone Encounter (Signed)
Spoke with patient, prescriptions were resubmitted

## 2015-10-15 NOTE — Telephone Encounter (Signed)
Pt states you were supposed to send prescriptions to [facility] mail but they have yet to receive it

## 2015-10-29 ENCOUNTER — Encounter: Payer: Self-pay | Admitting: Family Medicine

## 2015-10-29 ENCOUNTER — Ambulatory Visit (INDEPENDENT_AMBULATORY_CARE_PROVIDER_SITE_OTHER): Payer: PPO | Admitting: Family Medicine

## 2015-10-29 VITALS — BP 122/74 | HR 62 | Wt 146.0 lb

## 2015-10-29 DIAGNOSIS — M48061 Spinal stenosis, lumbar region without neurogenic claudication: Secondary | ICD-10-CM

## 2015-10-29 DIAGNOSIS — M810 Age-related osteoporosis without current pathological fracture: Secondary | ICD-10-CM

## 2015-10-29 DIAGNOSIS — M4806 Spinal stenosis, lumbar region: Secondary | ICD-10-CM | POA: Diagnosis not present

## 2015-10-29 NOTE — Assessment & Plan Note (Signed)
Patient is doing significantly better after the epidural. Discussed with patient to continue same regimen. Patient continue vitamin D. We discussed icing. Patient will follow-up with me again in 6 weeks to make sure we continue to do well. Encourage patient to stay active.

## 2015-10-29 NOTE — Assessment & Plan Note (Signed)
Patient does have osteoporosis. We discussed icing regimen. Discussed home exercises. Patient is going to continue with once weekly vitamin D. Unable to do bisphosphonates.

## 2015-10-29 NOTE — Progress Notes (Signed)
[facility] [place], [place] [address] Phone: [phone] Subjective:    I'm seeing this patient by the request  of:  Gwendolyn Grant, MD   CC: low back pain  Follow-up  [id number] [name] [name] is a 75 y.o. female coming in with complaint of low back pain. Patient was having radicular symptoms and did have more of a spinal stenosis. Patient was having weakness of the lower 20s. Patient was given a epidural steroid injection 2 weeks ago. Patient states that she is feeling 100% better. Still some mild discomfort from time to time but nothing as severe. No radicular symptoms down the leg. No weakness. Sleeping comfortably at night. No longer even taking the gabapentin. Continue on the once weekly vitamin D.    History shows the patient did have an MRI [facility]. This was inability visualized by me showing patient did have severe spinal stenosis at multiple levels.   Past Medical History  Diagnosis Date  . Obesity, unspecified   . Esophageal reflux   . Diverticulosis of colon (without mention of hemorrhage)   . Diaphragmatic hernia without mention of obstruction or gangrene   . Unspecified hemorrhoids with other complication   . Irritable bowel syndrome   . Benign neoplasm of colon   . Osteoporosis, unspecified   . Gallstones   . Hyperlipidemia   . Hypertension   . History of chicken pox   . Migraines   . DDD (degenerative disc disease), lumbar     MRI w. [facility] 05/2015, s/p ESI x 1 w/ improvement  . DJD (degenerative joint disease) of knee     L>R, IA cortisone by [facility] in L 03/2015   Past Surgical History  Procedure Laterality Date  . Vaginal hysterectomy  1996  . Cholecystectomy  1989  . Appendectomy  1979   Social History   Social History  . Marital Status: Married    Spouse Name: N/A  . Number of Children: N/A  . Years of Education: N/A   Social History Main Topics  . Smoking status: Never Smoker     . Smokeless tobacco: Never Used  . Alcohol Use: No  . Drug Use: No  . Sexual Activity: Not Asked   Other Topics Concern  . None   Social History Narrative   No Known Allergies Family History  Problem Relation Age of Onset  . Heart disease Sister     x 2  . Heart disease Brother   . Stroke Mother   . Colon cancer Sister   . Stroke Sister 60    Past medical history, social, surgical and family history all reviewed in electronic medical record.  No pertanent information unless stated regarding to the chief complaint.   Review of Systems: No headache, visual changes, nausea, vomiting, diarrhea, constipation, dizziness, abdominal pain, skin rash, fevers, chills, night sweats, weight loss, swollen lymph nodes, body aches, joint swelling, muscle aches, chest pain, shortness of breath, mood changes.   Objective Blood pressure 122/74, pulse 62, weight 146 lb (66.225 kg).  General: No apparent distress alert and oriented x3 mood and affect normal, dressed appropriately.  HEENT: Pupils equal, extraocular movements intact  Respiratory: Patient's speak in full sentences and does not appear short of breath  Cardiovascular: No lower extremity edema, non tender, no erythema  Skin: Warm dry intact with no signs of infection or rash on extremities or on axial skeleton.  Abdomen: Soft nontender  Neuro: Cranial nerves  II through XII are intact, neurovascularly intact in all extremities with 2+ DTRs and 2+ pulses.  Lymph: No lymphadenopathy of posterior or anterior cervical chain or axillae bilaterally.  Gait  Mild shuffling MSK:  Non tender with full range of motion and good stability and symmetric strength and tone of shoulders, elbows, wrist, hip, knee and ankles bilaterally.  Back Exam:  Inspection:  Increasing kyphosis Motion: Flexion 25 deg, Extension 25 deg, Side Bending to 35 deg bilaterally,  Rotation to 25 deg bilaterally  SLR laying:  Negative XSLR laying: Negative  Nontender on  exam Sensation intact to all lumbar and sacral dermatomes.  Reflexes: 2+ at both patellar tendons, 2+ at achilles tendons, Babinski's downgoing.  Strength at foot  Plantar-flexion: 5/5 Dorsi-flexion: 5/5 Eversion: 5/5 Inversion: 5/5  Leg strength  Quad: 5/5 Hamstring: 4/5 Hip flexor: 4/5 Hip abductors: 4/5     Impression and Recommendations:     This case required medical decision making of moderate complexity.      Note: This dictation was prepared with Dragon dictation along with smaller phrase technology. Any transcriptional errors that result from this process are unintentional.

## 2015-10-29 NOTE — Patient Instructions (Signed)
Good to see you  Ice hen you need it If pain then start the gabapentin for 2 weeks If in a couple months more pain can repeat epidural  Otherwise see me again in 6 weeks or when needed.

## 2015-11-01 ENCOUNTER — Ambulatory Visit (INDEPENDENT_AMBULATORY_CARE_PROVIDER_SITE_OTHER): Payer: PPO | Admitting: Internal Medicine

## 2015-11-01 ENCOUNTER — Other Ambulatory Visit: Payer: Self-pay | Admitting: Internal Medicine

## 2015-11-01 ENCOUNTER — Other Ambulatory Visit (INDEPENDENT_AMBULATORY_CARE_PROVIDER_SITE_OTHER): Payer: PPO

## 2015-11-01 ENCOUNTER — Encounter: Payer: Self-pay | Admitting: Internal Medicine

## 2015-11-01 VITALS — BP 134/88 | HR 57 | Temp 98.1°F | Resp 16 | Ht 64.0 in | Wt 145.0 lb

## 2015-11-01 DIAGNOSIS — K219 Gastro-esophageal reflux disease without esophagitis: Secondary | ICD-10-CM | POA: Diagnosis not present

## 2015-11-01 DIAGNOSIS — R7303 Prediabetes: Secondary | ICD-10-CM | POA: Insufficient documentation

## 2015-11-01 DIAGNOSIS — R7989 Other specified abnormal findings of blood chemistry: Secondary | ICD-10-CM

## 2015-11-01 DIAGNOSIS — M810 Age-related osteoporosis without current pathological fracture: Secondary | ICD-10-CM | POA: Diagnosis not present

## 2015-11-01 DIAGNOSIS — I10 Essential (primary) hypertension: Secondary | ICD-10-CM

## 2015-11-01 DIAGNOSIS — E785 Hyperlipidemia, unspecified: Secondary | ICD-10-CM | POA: Diagnosis not present

## 2015-11-01 DIAGNOSIS — Z0000 Encounter for general adult medical examination without abnormal findings: Secondary | ICD-10-CM

## 2015-11-01 LAB — LIPID PANEL
CHOL/HDL RATIO: 4
CHOLESTEROL: 265 mg/dL — AB (ref 0–200)
HDL: 62.3 mg/dL (ref >39.00)
LDL CALC: 166 mg/dL — AB (ref 0–99)
NonHDL: 202.95
TRIGLYCERIDES: 187 mg/dL — AB (ref 0.0–149.0)
VLDL: 37.4 mg/dL (ref 0.0–40.0)

## 2015-11-01 LAB — COMPREHENSIVE METABOLIC PANEL
ALBUMIN: 4.2 g/dL (ref 3.5–5.2)
ALT: 14 U/L (ref 0–35)
AST: 16 U/L (ref 0–37)
Alkaline Phosphatase: 45 U/L (ref 39–117)
BUN: 21 mg/dL (ref 6–23)
CALCIUM: 9.9 mg/dL (ref 8.4–10.5)
CHLORIDE: 102 meq/L (ref 96–112)
CO2: 35 meq/L — AB (ref 19–32)
Creatinine, Ser: 0.69 mg/dL (ref 0.40–1.20)
GFR: 87.78 mL/min (ref >60.00)
Glucose, Bld: 98 mg/dL (ref 70–99)
Potassium: 4.6 mEq/L (ref 3.5–5.1)
Sodium: 141 mEq/L (ref 135–145)
Total Bilirubin: 1.8 mg/dL — ABNORMAL HIGH (ref 0.2–1.2)
Total Protein: 7.5 g/dL (ref 6.0–8.3)

## 2015-11-01 LAB — CBC WITH DIFFERENTIAL/PLATELET
BASOS PCT: 0.5 % (ref 0.0–3.0)
Basophils Absolute: 0 10*3/uL (ref 0.0–0.1)
EOS PCT: 3.6 % (ref 0.0–5.0)
Eosinophils Absolute: 0.3 10*3/uL (ref 0.0–0.7)
HEMATOCRIT: 42.4 % (ref 36.0–46.0)
HEMOGLOBIN: 14.4 g/dL (ref 12.0–15.0)
LYMPHS PCT: 28.1 % (ref 12.0–46.0)
Lymphs Abs: 2.6 10*3/uL (ref 0.7–4.0)
MCHC: 34.1 g/dL (ref 30.0–36.0)
MCV: 86 fl (ref 78.0–100.0)
MONOS PCT: 7.5 % (ref 3.0–12.0)
Monocytes Absolute: 0.7 10*3/uL (ref 0.1–1.0)
Neutro Abs: 5.5 10*3/uL (ref 1.4–7.7)
Neutrophils Relative %: 60.3 % (ref 43.0–77.0)
Platelets: 243 10*3/uL (ref 150.0–400.0)
RBC: 4.92 Mil/uL (ref 3.87–5.11)
RDW: 13.7 % (ref 11.5–15.5)
WBC: 9.2 10*3/uL (ref 4.0–10.5)

## 2015-11-01 LAB — HEMOGLOBIN A1C: HEMOGLOBIN A1C: 6 % (ref 4.6–6.5)

## 2015-11-01 LAB — TSH: TSH: 7.54 u[IU]/mL — ABNORMAL HIGH (ref 0.35–4.50)

## 2015-11-01 MED ORDER — BIOTIN 5 MG PO CAPS: ORAL_CAPSULE | ORAL | Status: AC

## 2015-11-01 MED ORDER — HYDROCORTISONE-ACETIC ACID 1-2 % OT SOLN: 3.0000 [drp] | Freq: Two times a day (BID) | OTIC | Status: DC | PRN

## 2015-11-01 MED ORDER — RANITIDINE HCL 150 MG PO TABS: 150.0000 mg | ORAL_TABLET | Freq: Every day | ORAL | Status: DC

## 2015-11-01 MED ORDER — PSYLLIUM 95 % PO PACK: 1.0000 | PACK | Freq: Every day | ORAL | Status: DC

## 2015-11-01 NOTE — Assessment & Plan Note (Addendum)
Would like to get off of zetia due to expense Has tried statins in past - concern for muscle aches, so they were stopped Check lipid panel - consider trying a low dose stating daily or a few times a week

## 2015-11-01 NOTE — Assessment & Plan Note (Addendum)
Had prolia x 1 2017 Taking vitamin d and calcium Is exercising, but advised increased exercise - especially walking

## 2015-11-01 NOTE — Progress Notes (Signed)
Subjective:    Patient ID: [name] [name], female    DOB: [birth year], 75 y.o.   MRN: [id number]  HPI She is here to establish with a new pcp.  She is here for a physical exam.   She wonders about changing the zetia to a less expensive medication.  She did not tolerate the statins.   She does some yard work.  She does some walking, not regularly.   Medications and allergies reviewed with patient and updated if appropriate.  Patient Active Problem List   Diagnosis Date Noted  . Spinal stenosis of lumbar region 10/03/2015  . DDD (degenerative disc disease), lumbar   . DJD (degenerative joint disease) of knee   . Hypertension 11/25/2010  . History of colon polyps 05/14/2009  . OBESITY 05/14/2009  . ARTHRALGIA UNSPECIFIED SITE 02/06/2009  . BEN HTN HEART DISEASE WITHOUT HEART FAIL 08/17/2007  . G E R D 08/17/2007  . Hyperlipidemia 08/16/2007  . HIATAL HERNIA 08/16/2007  . DIVERTICULAR DISEASE 08/16/2007  . IBS 08/16/2007  . Osteoporosis 08/16/2007    Current Outpatient Prescriptions on File Prior to Visit  Medication Sig Dispense Refill  . acetaminophen (TYLENOL) 325 MG tablet Take 650 mg by mouth every 4 (four) hours as needed.    [name] aspirin 81 MG EC tablet Take 81 mg by mouth daily. Ecotrin    . bisoprolol-hydrochlorothiazide (ZIAC) 5-6.25 MG per tablet Take 1 tablet by mouth daily. 90 tablet 3  . calcium-vitamin D (OSCAL WITH D) 500-200 MG-UNIT per tablet Take 1 tablet by mouth 2 (two) times daily.     [name] ezetimibe (ZETIA) 10 MG tablet Take 1 tablet (10 mg total) by mouth daily. 90 tablet 3  . ibuprofen (ADVIL,MOTRIN) 200 MG tablet Take 600 mg by mouth 3 (three) times daily. With meals    . Multiple Vitamins-Minerals (CENTRUM SILVER PO) Take by mouth daily.      [name] nystatin cream (MYCOSTATIN) Apply 1 application topically 2 (two) times daily as needed.    . Vitamin D, Ergocalciferol, (DRISDOL) 50000 units CAPS capsule Take 1 capsule (50,000 Units total) by mouth every 7 (seven)  days. 12 capsule 0   No current facility-administered medications on file prior to visit.    Past Medical History  Diagnosis Date  . Obesity, unspecified   . Esophageal reflux   . Diverticulosis of colon (without mention of hemorrhage)   . Diaphragmatic hernia without mention of obstruction or gangrene   . Unspecified hemorrhoids with other complication   . Irritable bowel syndrome   . Benign neoplasm of colon   . Osteoporosis, unspecified   . Gallstones   . Hyperlipidemia   . Hypertension   . History of chicken pox   . Migraines   . DDD (degenerative disc disease), lumbar     MRI w. [facility] 05/2015, s/p ESI x 1 w/ improvement  . DJD (degenerative joint disease) of knee     L>R, IA cortisone by [facility] in L 03/2015    Past Surgical History  Procedure Laterality Date  . Vaginal hysterectomy  1996  . Cholecystectomy  1989  . Appendectomy  1979    Social History   Social History  . Marital Status: Married    Spouse Name: N/A  . Number of Children: N/A  . Years of Education: N/A   Social History Main Topics  . Smoking status: Never Smoker   . Smokeless tobacco: Never Used  . Alcohol Use: No  . Drug  Use: No  . Sexual Activity: Not Asked   Other Topics Concern  . None   Social History Narrative   Exercise: no regular exercise    Family History  Problem Relation Age of Onset  . Heart disease Sister     x 2  . Heart disease Brother   . Stroke Mother   . Colon cancer Sister   . Stroke Sister 13    Review of Systems  Constitutional: Positive for fatigue (low energy level). Negative for fever, chills and appetite change.  HENT: Negative for hearing loss.        Dry, itch ear canals  Eyes: Negative for visual disturbance.  Respiratory: Positive for cough (occasional cough - ? GERD related). Negative for shortness of breath and wheezing.   Cardiovascular: Negative for chest pain, palpitations and leg swelling.  Gastrointestinal: Positive for constipation  (controlled). Negative for abdominal pain, diarrhea and blood in stool.       Hemorrhoidal bleeding at times  Genitourinary: Negative for dysuria and hematuria.  Musculoskeletal: Positive for back pain.  Skin: Negative for color change and rash.       Thinning hair  Neurological: Negative for dizziness, light-headedness and headaches.       Objective:   Filed Vitals:   11/01/15 0906  BP: 134/88  Pulse: 57  Temp: 98.1 F (36.7 C)  Resp: 16   Filed Weights   11/01/15 0906  Weight: 145 lb (65.772 kg)   Body mass index is 24.88 kg/(m^2).   Physical Exam Constitutional: She appears well-developed and well-nourished. No distress.  HENT:  Head: Normocephalic and atraumatic.  Right Ear: External ear normal. Normal ear canal and TM Left Ear: External ear normal.  Normal ear canal and TM Mouth/Throat: Oropharynx is clear and moist.  Eyes: Conjunctivae and EOM are normal.  Neck: Neck supple. No tracheal deviation present. No thyromegaly present.  No carotid bruit  Cardiovascular: Normal rate, regular rhythm and normal heart sounds.   No murmur heard.  No edema. Pulmonary/Chest: Effort normal and breath sounds normal. No respiratory distress. She has no wheezes. She has no rales.  Breast: deferred to Gyn Abdominal: Soft. She exhibits no distension. There is no tenderness.  Lymphadenopathy: She has no cervical adenopathy.  Skin: Skin is warm and dry. She is not diaphoretic.  Psychiatric: She has a normal mood and affect. Her behavior is normal.         Assessment & Plan:   Physical exam: Screening blood work ordered Immunizations  Up to date Colonoscopy  Up to date  Mammogram  Up to date Dexa   Up to date  Eye exams   Up to date  EKG, last done 2014 Exercise - exercising some, but advised regular walking Weight - normal BMI, but appears overweight Skin -no concerns Substance abuse - no evidence of abuse  See Problem List for Assessment and Plan of chronic medical  problems.   Follow up in 6 months - wellness with susan and follow up with me

## 2015-11-01 NOTE — Patient Instructions (Addendum)
Test(s) ordered today. Your results will be released to [place] (or called to you) after review, usually within 72hours after test completion. If any changes need to be made, you will be notified at that same time.  All other Health Maintenance issues reviewed.   All recommended immunizations and age-appropriate screenings are up-to-date or discussed.  No immunizations administered today.   Medications reviewed and updated.  Changes include trying ranitidine for your heartburn   Your prescription(s) have been submitted to your pharmacy. Please take as directed and contact our office if you believe you are having problem(s) with the medication(s).   Please followup in 6 months  Health Maintenance, Female Adopting a healthy lifestyle and getting preventive care can go a long way to promote health and wellness. Talk with your health care provider about what schedule of regular examinations is right for you. This is a good chance for you to check in with your provider about disease prevention and staying healthy. In between checkups, there are plenty of things you can do on your own. Experts have done a lot of research about which lifestyle changes and preventive measures are most likely to keep you healthy. Ask your health care provider for more information. WEIGHT AND DIET  Eat a healthy diet  Be sure to include plenty of vegetables, fruits, low-fat dairy products, and lean protein.  Do not eat a lot of foods high in solid fats, added sugars, or salt.  Get regular exercise. This is one of the most important things you can do for your health.  Most adults should exercise for at least 150 minutes each week. The exercise should increase your heart rate and make you sweat (moderate-intensity exercise).  Most adults should also do strengthening exercises at least twice a week. This is in addition to the moderate-intensity exercise.  Maintain a healthy weight  Body mass index (BMI) is a  measurement that can be used to identify possible weight problems. It estimates body fat based on height and weight. Your health care provider can help determine your BMI and help you achieve or maintain a healthy weight.  For females 35 years of age and older:   A BMI below 18.5 is considered underweight.  A BMI of 18.5 to 24.9 is normal.  A BMI of 25 to 29.9 is considered overweight.  A BMI of 30 and above is considered obese.  Watch levels of cholesterol and blood lipids  You should start having your blood tested for lipids and cholesterol at 77 years of age, then have this test every 5 years.  You may need to have your cholesterol levels checked more often if:  Your lipid or cholesterol levels are high.  You are older than 77 years of age.  You are at high risk for heart disease.  CANCER SCREENING   Lung Cancer  Lung cancer screening is recommended for adults 1-85 years old who are at high risk for lung cancer because of a history of smoking.  A yearly low-dose CT scan of the lungs is recommended for people who:  Currently smoke.  Have quit within the past 15 years.  Have at least a 30-pack-year history of smoking. A pack year is smoking an average of one pack of cigarettes a day for 1 year.  Yearly screening should continue until it has been 15 years since you quit.  Yearly screening should stop if you develop a health problem that would prevent you from having lung cancer treatment.  Breast  Cancer  Practice breast self-awareness. This means understanding how your breasts normally appear and feel.  It also means doing regular breast self-exams. Let your health care provider know about any changes, no matter how small.  If you are in your 20s or 30s, you should have a clinical breast exam (CBE) by a health care provider every 1-3 years as part of a regular health exam.  If you are 67 or older, have a CBE every year. Also consider having a breast X-ray  (mammogram) every year.  If you have a family history of breast cancer, talk to your health care provider about genetic screening.  If you are at high risk for breast cancer, talk to your health care provider about having an MRI and a mammogram every year.  Breast cancer gene (BRCA) assessment is recommended for women who have family members with BRCA-related cancers. BRCA-related cancers include:  Breast.  Ovarian.  Tubal.  Peritoneal cancers.  Results of the assessment will determine the need for genetic counseling and BRCA1 and BRCA2 testing. Cervical Cancer Your health care provider may recommend that you be screened regularly for cancer of the pelvic organs (ovaries, uterus, and vagina). This screening involves a pelvic examination, including checking for microscopic changes to the surface of your cervix (Pap test). You may be encouraged to have this screening done every 3 years, beginning at age 25.  For women ages 16-65, health care providers may recommend pelvic exams and Pap testing every 3 years, or they may recommend the Pap and pelvic exam, combined with testing for human papilloma virus (HPV), every 5 years. Some types of HPV increase your risk of cervical cancer. Testing for HPV may also be done on women of any age with unclear Pap test results.  Other health care providers may not recommend any screening for nonpregnant women who are considered low risk for pelvic cancer and who do not have symptoms. Ask your health care provider if a screening pelvic exam is right for you.  If you have had past treatment for cervical cancer or a condition that could lead to cancer, you need Pap tests and screening for cancer for at least 20 years after your treatment. If Pap tests have been discontinued, your risk factors (such as having a new sexual partner) need to be reassessed to determine if screening should resume. Some women have medical problems that increase the chance of getting  cervical cancer. In these cases, your health care provider may recommend more frequent screening and Pap tests. Colorectal Cancer  This type of cancer can be detected and often prevented.  Routine colorectal cancer screening usually begins at 77 years of age and continues through 77 years of age.  Your health care provider may recommend screening at an earlier age if you have risk factors for colon cancer.  Your health care provider may also recommend using home test kits to check for hidden blood in the stool.  A small camera at the end of a tube can be used to examine your colon directly (sigmoidoscopy or colonoscopy). This is done to check for the earliest forms of colorectal cancer.  Routine screening usually begins at age 36.  Direct examination of the colon should be repeated every 5-10 years through 77 years of age. However, you may need to be screened more often if early forms of precancerous polyps or small growths are found. Skin Cancer  Check your skin from head to toe regularly.  Tell your health care provider  about any new moles or changes in moles, especially if there is a change in a mole's shape or color.  Also tell your health care provider if you have a mole that is larger than the size of a pencil eraser.  Always use sunscreen. Apply sunscreen liberally and repeatedly throughout the day.  Protect yourself by wearing long sleeves, pants, a wide-brimmed hat, and sunglasses whenever you are outside. HEART DISEASE, DIABETES, AND HIGH BLOOD PRESSURE   High blood pressure causes heart disease and increases the risk of stroke. High blood pressure is more likely to develop in:  People who have blood pressure in the high end of the normal range (130-139/85-89 mm Hg).  People who are overweight or obese.  People who are African American.  If you are 110-98 years of age, have your blood pressure checked every 3-5 years. If you are 38 years of age or older, have your blood  pressure checked every year. You should have your blood pressure measured twice--once when you are at a hospital or clinic, and once when you are not at a hospital or clinic. Record the average of the two measurements. To check your blood pressure when you are not at a hospital or clinic, you can use:  An automated blood pressure machine at a pharmacy.  A home blood pressure monitor.  If you are between 37 years and 19 years old, ask your health care provider if you should take aspirin to prevent strokes.  Have regular diabetes screenings. This involves taking a blood sample to check your fasting blood sugar level.  If you are at a normal weight and have a low risk for diabetes, have this test once every three years after 77 years of age.  If you are overweight and have a high risk for diabetes, consider being tested at a younger age or more often. PREVENTING INFECTION  Hepatitis B  If you have a higher risk for hepatitis B, you should be screened for this virus. You are considered at high risk for hepatitis B if:  You were born in a country where hepatitis B is common. Ask your health care provider which countries are considered high risk.  Your parents were born in a high-risk country, and you have not been immunized against hepatitis B (hepatitis B vaccine).  You have HIV or AIDS.  You use needles to inject street drugs.  You live with someone who has hepatitis B.  You have had sex with someone who has hepatitis B.  You get hemodialysis treatment.  You take certain medicines for conditions, including cancer, organ transplantation, and autoimmune conditions. Hepatitis C  Blood testing is recommended for:  Everyone born from [birth year] through 1967.  Anyone with known risk factors for hepatitis C. Sexually transmitted infections (STIs)  You should be screened for sexually transmitted infections (STIs) including gonorrhea and chlamydia if:  You are sexually active and are  younger than 77 years of age.  You are older than 77 years of age and your health care provider tells you that you are at risk for this type of infection.  Your sexual activity has changed since you were last screened and you are at an increased risk for chlamydia or gonorrhea. Ask your health care provider if you are at risk.  If you do not have HIV, but are at risk, it may be recommended that you take a prescription medicine daily to prevent HIV infection. This is called pre-exposure prophylaxis (PrEP). You are considered  at risk if:  You are sexually active and do not regularly use condoms or know the HIV status of your partner(s).  You take drugs by injection.  You are sexually active with a partner who has HIV. Talk with your health care provider about whether you are at high risk of being infected with HIV. If you choose to begin PrEP, you should first be tested for HIV. You should then be tested every 3 months for as long as you are taking PrEP.  PREGNANCY   If you are premenopausal and you may become pregnant, ask your health care provider about preconception counseling.  If you may become pregnant, take 400 to 800 micrograms (mcg) of folic acid every day.  If you want to prevent pregnancy, talk to your health care provider about birth control (contraception). OSTEOPOROSIS AND MENOPAUSE   Osteoporosis is a disease in which the bones lose minerals and strength with aging. This can result in serious bone fractures. Your risk for osteoporosis can be identified using a bone density scan.  If you are 68 years of age or older, or if you are at risk for osteoporosis and fractures, ask your health care provider if you should be screened.  Ask your health care provider whether you should take a calcium or vitamin D supplement to lower your risk for osteoporosis.  Menopause may have certain physical symptoms and risks.  Hormone replacement therapy may reduce some of these symptoms and  risks. Talk to your health care provider about whether hormone replacement therapy is right for you.  HOME CARE INSTRUCTIONS   Schedule regular health, dental, and eye exams.  Stay current with your immunizations.   Do not use any tobacco products including cigarettes, chewing tobacco, or electronic cigarettes.  If you are pregnant, do not drink alcohol.  If you are breastfeeding, limit how much and how often you drink alcohol.  Limit alcohol intake to no more than 1 drink per day for nonpregnant women. One drink equals 12 ounces of beer, 5 ounces of wine, or 1 ounces of hard liquor.  Do not use street drugs.  Do not share needles.  Ask your health care provider for help if you need support or information about quitting drugs.  Tell your health care provider if you often feel depressed.  Tell your health care provider if you have ever been abused or do not feel safe at home.   This information is not intended to replace advice given to you by your health care provider. Make sure you discuss any questions you have with your health care provider.   Document Released: 01/27/2011 Document Revised: 08/04/2014 Document Reviewed: 06/15/2013 Elsevier Interactive Patient Education [employer].

## 2015-11-01 NOTE — Assessment & Plan Note (Signed)
Taking omeprazole 20 mg daily and GERD controlled Will try zantac 150 mg at HS and stop the omeprazole

## 2015-11-01 NOTE — Assessment & Plan Note (Signed)
BP well controlled Current regimen effective and well tolerated Continue current medications at current doses  

## 2015-11-01 NOTE — Progress Notes (Signed)
Pre visit review using our clinic review tool, if applicable. No additional management support is needed unless otherwise documented below in the visit note. 

## 2015-11-02 ENCOUNTER — Encounter: Payer: Self-pay | Admitting: Emergency Medicine

## 2015-11-06 ENCOUNTER — Telehealth: Payer: Self-pay | Admitting: *Deleted

## 2015-11-06 NOTE — Telephone Encounter (Signed)
Received call from [facility] mail service wanting to verify directions on ear drop solutions that was sent on 10/31/15. Gave directions 3 drop twice a day as needed...[name] [name]

## 2015-11-20 ENCOUNTER — Telehealth: Payer: Self-pay | Admitting: Internal Medicine

## 2015-11-20 NOTE — Telephone Encounter (Signed)
The only option is retrying a stating but at a very low dose three times a week to see if she can tolerate that - let me know if she wants to try that -  She may want it sent to a local pharmacy - I am not sure if she will be able to tolerate it

## 2015-11-20 NOTE — Telephone Encounter (Signed)
Pt stated when she saw Dr. Quay Burow for CPE, Dr. Quay Burow was going to put her on other medication for cholesterol because Zetia is expensive. Please help and she it into mail order.

## 2015-11-20 NOTE — Telephone Encounter (Signed)
Please advise 

## 2015-11-21 NOTE — Telephone Encounter (Signed)
LVM for pt to call back.

## 2015-11-22 MED ORDER — PRAVASTATIN SODIUM 10 MG PO TABS: ORAL_TABLET | ORAL | Status: DC

## 2015-11-22 NOTE — Telephone Encounter (Signed)
Spoke with pt. She is okay with starting a Statin. Would like it sent to [facility].

## 2015-11-22 NOTE — Telephone Encounter (Signed)
Sent to [facility]of - three times a week

## 2015-12-27 ENCOUNTER — Telehealth: Payer: Self-pay

## 2015-12-27 NOTE — Telephone Encounter (Signed)
Left message advising patient to call back to schedule nurse visit on or after February 12, 2016 for prolia injection---insurance has been verified and estimated $225 copay is needed---any questions, can talk with Jawuan Robb

## 2015-12-31 NOTE — Telephone Encounter (Signed)
This has been handled by Su Hilt @ [facility]

## 2016-01-16 ENCOUNTER — Telehealth: Payer: Self-pay | Admitting: Internal Medicine

## 2016-01-16 MED ORDER — BISOPROLOL-HYDROCHLOROTHIAZIDE 5-6.25 MG PO TABS: 1.0000 | ORAL_TABLET | Freq: Every day | ORAL | Status: DC

## 2016-01-16 NOTE — Telephone Encounter (Signed)
Pt called in and would like refill of her bisoprolol-hydrochlorothiazide [facility]) 5-6.25 MG per tablet [id number]  Sent to:   [place] fax number [phone]

## 2016-01-16 NOTE — Telephone Encounter (Signed)
SENT 

## 2016-01-17 DIAGNOSIS — L814 Other melanin hyperpigmentation: Secondary | ICD-10-CM | POA: Diagnosis not present

## 2016-01-17 DIAGNOSIS — L84 Corns and callosities: Secondary | ICD-10-CM | POA: Diagnosis not present

## 2016-01-17 DIAGNOSIS — D235 Other benign neoplasm of skin of trunk: Secondary | ICD-10-CM | POA: Diagnosis not present

## 2016-01-17 DIAGNOSIS — L821 Other seborrheic keratosis: Secondary | ICD-10-CM | POA: Diagnosis not present

## 2016-01-17 DIAGNOSIS — D1801 Hemangioma of skin and subcutaneous tissue: Secondary | ICD-10-CM | POA: Diagnosis not present

## 2016-02-13 ENCOUNTER — Telehealth: Payer: Self-pay

## 2016-02-13 NOTE — Telephone Encounter (Signed)
Left message reminding patient again that prolia injection is due----estimated copay $225---can schedule nurse visit at patient's earliest convenience---can talk with Shannelle Alguire if any questions

## 2016-02-21 ENCOUNTER — Ambulatory Visit (INDEPENDENT_AMBULATORY_CARE_PROVIDER_SITE_OTHER): Payer: PPO

## 2016-02-21 DIAGNOSIS — M810 Age-related osteoporosis without current pathological fracture: Secondary | ICD-10-CM

## 2016-02-21 MED ORDER — DENOSUMAB 60 MG/ML ~~LOC~~ SOLN
60.0000 mg | Freq: Once | SUBCUTANEOUS | Status: AC
  Administered 2016-02-21: 60 mg via SUBCUTANEOUS

## 2016-03-27 DIAGNOSIS — N390 Urinary tract infection, site not specified: Secondary | ICD-10-CM | POA: Diagnosis not present

## 2016-03-27 DIAGNOSIS — R351 Nocturia: Secondary | ICD-10-CM | POA: Diagnosis not present

## 2016-03-27 DIAGNOSIS — Z6825 Body mass index (BMI) 25.0-25.9, adult: Secondary | ICD-10-CM | POA: Diagnosis not present

## 2016-03-27 DIAGNOSIS — Z01419 Encounter for gynecological examination (general) (routine) without abnormal findings: Secondary | ICD-10-CM | POA: Diagnosis not present

## 2016-03-27 DIAGNOSIS — R300 Dysuria: Secondary | ICD-10-CM | POA: Diagnosis not present

## 2016-04-08 DIAGNOSIS — R1031 Right lower quadrant pain: Secondary | ICD-10-CM | POA: Diagnosis not present

## 2016-04-15 DIAGNOSIS — H6123 Impacted cerumen, bilateral: Secondary | ICD-10-CM | POA: Diagnosis not present

## 2016-04-15 DIAGNOSIS — H938X3 Other specified disorders of ear, bilateral: Secondary | ICD-10-CM | POA: Diagnosis not present

## 2016-04-15 DIAGNOSIS — J310 Chronic rhinitis: Secondary | ICD-10-CM | POA: Diagnosis not present

## 2016-04-15 DIAGNOSIS — L299 Pruritus, unspecified: Secondary | ICD-10-CM | POA: Diagnosis not present

## 2016-04-30 ENCOUNTER — Other Ambulatory Visit (INDEPENDENT_AMBULATORY_CARE_PROVIDER_SITE_OTHER): Payer: PRIVATE HEALTH INSURANCE

## 2016-04-30 ENCOUNTER — Encounter: Payer: Self-pay | Admitting: Internal Medicine

## 2016-04-30 ENCOUNTER — Ambulatory Visit (INDEPENDENT_AMBULATORY_CARE_PROVIDER_SITE_OTHER)
Admission: RE | Admit: 2016-04-30 | Discharge: 2016-04-30 | Disposition: A | Discharge status: Home / Self Care | Payer: PRIVATE HEALTH INSURANCE | Source: Ambulatory Visit | Attending: Internal Medicine | Admitting: Internal Medicine

## 2016-04-30 ENCOUNTER — Ambulatory Visit (INDEPENDENT_AMBULATORY_CARE_PROVIDER_SITE_OTHER): Payer: PPO | Admitting: Internal Medicine

## 2016-04-30 VITALS — BP 166/100 | HR 62 | Temp 98.0°F | Resp 16 | Ht 64.0 in | Wt 128.0 lb

## 2016-04-30 DIAGNOSIS — K5900 Constipation, unspecified: Secondary | ICD-10-CM | POA: Insufficient documentation

## 2016-04-30 DIAGNOSIS — R946 Abnormal results of thyroid function studies: Secondary | ICD-10-CM | POA: Diagnosis not present

## 2016-04-30 DIAGNOSIS — R1031 Right lower quadrant pain: Secondary | ICD-10-CM | POA: Diagnosis not present

## 2016-04-30 DIAGNOSIS — R1030 Lower abdominal pain, unspecified: Secondary | ICD-10-CM | POA: Diagnosis not present

## 2016-04-30 DIAGNOSIS — E7800 Pure hypercholesterolemia, unspecified: Secondary | ICD-10-CM | POA: Diagnosis not present

## 2016-04-30 DIAGNOSIS — Z23 Encounter for immunization: Secondary | ICD-10-CM | POA: Diagnosis not present

## 2016-04-30 DIAGNOSIS — K219 Gastro-esophageal reflux disease without esophagitis: Secondary | ICD-10-CM | POA: Diagnosis not present

## 2016-04-30 DIAGNOSIS — R194 Change in bowel habit: Secondary | ICD-10-CM

## 2016-04-30 DIAGNOSIS — R7989 Other specified abnormal findings of blood chemistry: Secondary | ICD-10-CM

## 2016-04-30 DIAGNOSIS — M810 Age-related osteoporosis without current pathological fracture: Secondary | ICD-10-CM

## 2016-04-30 DIAGNOSIS — R7303 Prediabetes: Secondary | ICD-10-CM

## 2016-04-30 DIAGNOSIS — I10 Essential (primary) hypertension: Secondary | ICD-10-CM

## 2016-04-30 LAB — LIPID PANEL
Cholesterol: 214 mg/dL — ABNORMAL HIGH (ref 0–200)
HDL: 56.8 mg/dL (ref >39.00)
LDL Cholesterol: 135 mg/dL — ABNORMAL HIGH (ref 0–99)
NonHDL: 157.55
TRIGLYCERIDES: 115 mg/dL (ref 0.0–149.0)
Total CHOL/HDL Ratio: 4
VLDL: 23 mg/dL (ref 0.0–40.0)

## 2016-04-30 LAB — COMPREHENSIVE METABOLIC PANEL
ALBUMIN: 4.2 g/dL (ref 3.5–5.2)
ALK PHOS: 37 U/L — AB (ref 39–117)
ALT: 12 U/L (ref 0–35)
AST: 16 U/L (ref 0–37)
BUN: 13 mg/dL (ref 6–23)
CO2: 34 mEq/L — ABNORMAL HIGH (ref 19–32)
Calcium: 9.4 mg/dL (ref 8.4–10.5)
Chloride: 101 mEq/L (ref 96–112)
Creatinine, Ser: 0.68 mg/dL (ref 0.40–1.20)
GFR: 89.16 mL/min (ref >60.00)
Glucose, Bld: 94 mg/dL (ref 70–99)
POTASSIUM: 4.1 meq/L (ref 3.5–5.1)
Sodium: 141 mEq/L (ref 135–145)
TOTAL PROTEIN: 7.6 g/dL (ref 6.0–8.3)
Total Bilirubin: 1.5 mg/dL — ABNORMAL HIGH (ref 0.2–1.2)

## 2016-04-30 LAB — HEMOGLOBIN A1C: HEMOGLOBIN A1C: 5.6 % (ref 4.6–6.5)

## 2016-04-30 LAB — TSH: TSH: 3.32 u[IU]/mL (ref 0.35–4.50)

## 2016-04-30 LAB — T4, FREE: Free T4: 0.75 ng/dL (ref 0.60–1.60)

## 2016-04-30 MED ORDER — RANITIDINE HCL 150 MG PO TABS: 150.0000 mg | ORAL_TABLET | Freq: Two times a day (BID) | ORAL | 3 refills | Status: DC

## 2016-04-30 NOTE — Assessment & Plan Note (Addendum)
Goal BP < 150/90 BP high She will start monitoring at home  Check labs Will adjust meds if labs are normal and BP elevated at home May want to stop hctz (concern with it causing elevated sugars)

## 2016-04-30 NOTE — Assessment & Plan Note (Signed)
Mild change in bowel habits-mostly with urgency, which is no She is also having some right groin pain, which is likely not related Family history of colon cancer Due for colonoscopy this year-? Had one related to age or not We'll refer to GI for concerns and to discuss whether she should have another colonoscopy or not

## 2016-04-30 NOTE — Progress Notes (Signed)
Pre visit review using our clinic review tool, if applicable. No additional management support is needed unless otherwise documented below in the visit note. 

## 2016-04-30 NOTE — Assessment & Plan Note (Signed)
The patient saw GYN-not gynecological ? Muscular, hernia, and osteoarthritis, gastrointestinal Her pain seems more musculoskeletal Will check hip x-ray No obvious bulge in her symptoms are not exacerbated by things that would expect a hernia to be exacerbated by She is due to see GI so she will be referred to rule out a gastrointestinal source of her pain Further evaluation depending on x-ray, blood work and GI evaluation

## 2016-04-30 NOTE — Assessment & Plan Note (Signed)
She has decreased her carb and sugar intake She is active, but does not exercise She has lost weight Check a1c

## 2016-04-30 NOTE — Assessment & Plan Note (Addendum)
Restarted omeprazole 20 but has stopped tumeric and wants to try to go back to zantac Try zantac BID

## 2016-04-30 NOTE — Assessment & Plan Note (Signed)
Check lipids. Continue statin.

## 2016-04-30 NOTE — Progress Notes (Signed)
Subjective:    Patient ID: [name] [name], female    DOB: 1940/12/12, 75 y.o.   MRN: [id number]  HPI The patient is here for follow up.  Right groin pain:  She has had pain for over one month.  She mentioned it to her gyn and her ovaries were ok ( did an [place]).  She is due for a colonoscopy and is concerned the pain is from her colon.  The pain is intermittent.  She noticed it most when it first started when she was sitting.  Now she feels it all the time.  The pain is not worse with having a bowel movement.  She has pain sometimes across her lower abdomen.  She has pain sometime with walking or changing positions.  She denies a bulge.  She denies increased pain with cough or sneezing.   Her bowel movements are fairly normal.  She has some urgency with bowel movements and the urgency goes away once she sits on the toilet.  This is new and concerning.  Hypertension: She is taking her medication daily. She is compliant with a low sodium diet.  She denies chest pain, palpitations, edema, shortness of breath and regular headaches. She is not exercising regularly.  She does not monitor her blood pressure at home.    Hyperlipidemia: She is taking her medication daily. She is compliant with a low fat/cholesterol diet. She is not exercising regularly. She denies myalgias.   GERD:  She is taking her medication daily as prescribed.  She denies any GERD symptoms and feels her GERD is well controlled.   Osteoporosis:  Taking vitamin D and calcium daily.  She has taken two prolia injections.   Prediabetes:  She is compliant with a low sugar/carbohydrate diet.  She is active, but not exercising regularly.   Medications and allergies reviewed with patient and updated if appropriate.  Patient Active Problem List   Diagnosis Date Noted  . Prediabetes 11/01/2015  . Elevated TSH 11/01/2015  . Spinal stenosis of lumbar region 10/03/2015  . DDD (degenerative disc disease), lumbar   . DJD (degenerative joint  disease) of knee   . Hypertension 11/25/2010  . History of colon polyps 05/14/2009  . OBESITY 05/14/2009  . ARTHRALGIA UNSPECIFIED SITE 02/06/2009  . BEN HTN HEART DISEASE WITHOUT HEART FAIL 08/17/2007  . G E R D 08/17/2007  . Hyperlipidemia 08/16/2007  . HIATAL HERNIA 08/16/2007  . DIVERTICULAR DISEASE 08/16/2007  . IBS 08/16/2007  . Osteoporosis 08/16/2007    Current Outpatient Prescriptions on File Prior to Visit  Medication Sig Dispense Refill  . acetaminophen (TYLENOL) 325 MG tablet Take 650 mg by mouth every 4 (four) hours as needed.    [name] acetic acid-hydrocortisone (VOSOL-HC) otic solution Place 3 drops into both ears 2 (two) times daily as needed. 30 mL 1  . aspirin 81 MG EC tablet Take 81 mg by mouth daily. Ecotrin    . Biotin 5 MG CAPS Taking daily  0  . bisoprolol-hydrochlorothiazide (ZIAC) 5-6.25 MG tablet Take 1 tablet by mouth daily. 90 tablet 3  . calcium-vitamin D (OSCAL WITH D) 500-200 MG-UNIT per tablet Take 1 tablet by mouth 2 (two) times daily.     [name] ibuprofen (ADVIL,MOTRIN) 200 MG tablet Take 600 mg by mouth 3 (three) times daily. With meals    . Multiple Vitamins-Minerals (CENTRUM SILVER PO) Take by mouth daily.      [name] nystatin cream (MYCOSTATIN) Apply 1 application topically 2 (two) times daily  as needed.    . pravastatin (PRAVACHOL) 10 MG tablet Take one tablet three times a week 36 tablet 3  . Vitamin D, Ergocalciferol, (DRISDOL) 50000 units CAPS capsule Take 1 capsule (50,000 Units total) by mouth every 7 (seven) days. 12 capsule 0   No current facility-administered medications on file prior to visit.     Past Medical History:  Diagnosis Date  . Benign neoplasm of colon   . DDD (degenerative disc disease), lumbar    MRI w. [facility] 05/2015, s/p ESI x 1 w/ improvement  . Diaphragmatic hernia without mention of obstruction or gangrene   . Diverticulosis of colon (without mention of hemorrhage)   . DJD (degenerative joint disease) of knee    L>R, IA  cortisone by [facility] in L 03/2015  . Esophageal reflux   . Gallstones   . History of chicken pox   . Hyperlipidemia   . Hypertension   . Irritable bowel syndrome   . Migraines   . Obesity, unspecified   . Osteoporosis, unspecified   . Unspecified hemorrhoids with other complication     Past Surgical History:  Procedure Laterality Date  . APPENDECTOMY  1979  . CHOLECYSTECTOMY  1989  . VAGINAL HYSTERECTOMY  1996    Social History   Social History  . Marital status: Married    Spouse name: N/A  . Number of children: N/A  . Years of education: N/A   Social History Main Topics  . Smoking status: Never Smoker  . Smokeless tobacco: Never Used  . Alcohol use No  . Drug use: No  . Sexual activity: Not on file   Other Topics Concern  . Not on file   Social History Narrative   Exercise: no regular exercise    Family History  Problem Relation Age of Onset  . Heart disease Sister     x 2  . Heart disease Brother   . Stroke Mother   . Colon cancer Sister   . Stroke Sister 20    Review of Systems  Constitutional: Negative for chills and fever.  Respiratory: Negative for cough, shortness of breath and wheezing.   Cardiovascular: Negative for chest pain, palpitations and leg swelling.  Gastrointestinal: Positive for abdominal pain (lower abdomen at times, more in right groin). Negative for blood in stool.  Musculoskeletal: Negative for myalgias.  Neurological: Negative for light-headedness and headaches.       Objective:   Vitals:   04/30/16 1028  BP: (!) 166/100  Pulse: 62  Resp: 16  Temp: 98 F (36.7 C)   Filed Weights   04/30/16 1028  Weight: 128 lb (58.1 kg)   Body mass index is 21.97 kg/m.   Physical Exam    Constitutional: Appears well-developed and well-nourished. No distress.  HENT:  Head: Normocephalic and atraumatic.  Neck: Neck supple. No tracheal deviation present. No thyromegaly present.  No cervical lymphadenopathy Cardiovascular:  Normal rate, regular rhythm and normal heart sounds.   No murmur heard. No carotid bruit .  No edema Pulmonary/Chest: Effort normal and breath sounds normal. No respiratory distress. No has no wheezes. No rales.  Abdomen: soft, nontender, nondistended, no mass, no bulge in groin Musculoskeletal: Some discomfort in the right groin with going from a laying to sitting position, no pain with manipulation of leg/hip  Skin: Skin is warm and dry. Not diaphoretic.  Psychiatric: Normal mood and affect. Behavior is normal.      Assessment & Plan:   Flu vaccine  today  See Problem List for Assessment and Plan of chronic medical problems.   Follow-up in 6 months

## 2016-04-30 NOTE — Patient Instructions (Addendum)
Blood work and an Insurance account manager were ordered.  Test(s) ordered today. Your results will be released to [place] (or called to you) after review, usually within 72hours after test completion. If any changes need to be made, you will be notified at that same time.  All other Health Maintenance issues reviewed.   All recommended immunizations and age-appropriate screenings are up-to-date or discussed.  Flu vaccine administered today.   Medications reviewed and updated.  No changes recommended at this time.  Your prescription(s) have been submitted to your pharmacy. Please take as directed and contact our office if you believe you are having problem(s) with the medication(s).  A referral was ordered for GI.   Please followup in 6 months for a physical

## 2016-04-30 NOTE — Assessment & Plan Note (Signed)
Tsh, ft4, thyroid ab

## 2016-04-30 NOTE — Assessment & Plan Note (Addendum)
Taking 1400 units of vitamin D daily Taking 1220 of calcium daily Taking prolia - has had two injections

## 2016-05-01 LAB — THYROID ANTIBODIES

## 2016-05-04 ENCOUNTER — Encounter: Payer: Self-pay | Admitting: Internal Medicine

## 2016-05-04 DIAGNOSIS — R768 Other specified abnormal immunological findings in serum: Secondary | ICD-10-CM | POA: Insufficient documentation

## 2016-05-07 ENCOUNTER — Encounter: Payer: Self-pay | Admitting: Emergency Medicine

## 2016-05-16 ENCOUNTER — Encounter: Payer: Self-pay | Admitting: Gastroenterology

## 2016-05-22 ENCOUNTER — Encounter: Payer: Self-pay | Admitting: Nurse Practitioner

## 2016-05-22 ENCOUNTER — Ambulatory Visit (INDEPENDENT_AMBULATORY_CARE_PROVIDER_SITE_OTHER): Payer: PPO | Admitting: Nurse Practitioner

## 2016-05-22 VITALS — BP 140/80 | HR 66 | Ht 64.0 in | Wt 128.6 lb

## 2016-05-22 DIAGNOSIS — R194 Change in bowel habit: Secondary | ICD-10-CM | POA: Diagnosis not present

## 2016-05-22 DIAGNOSIS — G8929 Other chronic pain: Secondary | ICD-10-CM | POA: Diagnosis not present

## 2016-05-22 DIAGNOSIS — R1031 Right lower quadrant pain: Secondary | ICD-10-CM

## 2016-05-22 DIAGNOSIS — Z800 Family history of malignant neoplasm of digestive organs: Secondary | ICD-10-CM

## 2016-05-22 MED ORDER — NA SULFATE-K SULFATE-MG SULF 17.5-3.13-1.6 GM/177ML PO SOLN: 1.0000 | Freq: Once | ORAL | 0 refills | Status: AC

## 2016-05-22 NOTE — Progress Notes (Signed)
HPI: Patient is a 75 year old female known to Dr. Fuller Plan for history of GERD and [facility] of colon cancer. She is due for 5 year surveillance colonoscopy. Patient is referred by PCP Billey Gosling, MD for bowel changes and abdominal pain.   Ms. Marone is concerned about bowel changes and RLQ discomfort. She takes citrucel, stools are soft and she has a daily BM but sometimes during the day she feels urge to defecate and despite straining, doesn't pass any stool. For a month she has had RLQ discomfort which is dull but at time  harp such as when transitioning from a sitting to standing position. Getting in a good position such as laying on stomach helps. Often times having a BM helps. Her sleep is undisturbed by discomfort.  GYN did ultrasound and everything was okay per patient. Right hip xray unrevealing.   PPI discontinued by PCP out of concern for bone density. Patient started on zantac daily but had to increase to Bid for breakthrough symptoms. Having less breakthrough heartburn now, maybe 1-2 times a week  Past Medical History:  Diagnosis Date  . Benign neoplasm of colon   . DDD (degenerative disc disease), lumbar    MRI w. [place] [facility] 05/2015, s/p ESI x 1 w/ improvement  . Diaphragmatic hernia without mention of obstruction or gangrene   . Diverticulosis of colon (without mention of hemorrhage)   . DJD (degenerative joint disease) of knee    L>R, IA cortisone by [place] [facility] in L 03/2015  . Esophageal reflux   . Gallstones   . History of chicken pox   . Hyperlipidemia   . Hypertension   . Irritable bowel syndrome   . Migraines   . Obesity, unspecified   . Osteoporosis, unspecified   . Unspecified hemorrhoids with other complication      Past Surgical History:  Procedure Laterality Date  . APPENDECTOMY  1979  . CHOLECYSTECTOMY  1989  . VAGINAL HYSTERECTOMY  1996   Family History  Problem Relation Age of Onset  . Heart disease Sister     x 2  . Heart disease Brother   . Stroke  Mother   . Colon cancer Sister   . Stroke Sister 46   Social History  Substance Use Topics  . Smoking status: Never Smoker  . Smokeless tobacco: Never Used  . Alcohol use No   Current Outpatient Prescriptions  Medication Sig Dispense Refill  . acetaminophen (TYLENOL) 325 MG tablet Take 650 mg by mouth every 4 (four) hours as needed.    [name] acetic acid-hydrocortisone (VOSOL-HC) otic solution Place 3 drops into both ears 2 (two) times daily as needed. 30 mL 1  . aspirin 81 MG EC tablet Take 81 mg by mouth daily. Ecotrin    . Biotin 5 MG CAPS Taking daily  0  . bisoprolol-hydrochlorothiazide (ZIAC) 5-6.25 MG tablet Take 1 tablet by mouth daily. 90 tablet 3  . calcium-vitamin D (OSCAL WITH D) 500-200 MG-UNIT per tablet Take 1 tablet by mouth 2 (two) times daily.     . clobetasol ointment (TEMOVATE) [id number] % Apply 1 application topically 2 (two) times daily.    [name] ibuprofen (ADVIL,MOTRIN) 200 MG tablet Take 600 mg by mouth 3 (three) times daily. With meals    . Multiple Vitamins-Minerals (CENTRUM SILVER PO) Take by mouth daily.      [name] nystatin cream (MYCOSTATIN) Apply 1 application topically 2 (two) times daily as needed.    . pravastatin (  PRAVACHOL) 10 MG tablet Take one tablet three times a week 36 tablet 3  . ranitidine (ZANTAC) 150 MG tablet Take 1 tablet (150 mg total) by mouth 2 (two) times daily. 180 tablet 3  . Vitamin D, Ergocalciferol, (DRISDOL) 50000 units CAPS capsule Take 1 capsule (50,000 Units total) by mouth every 7 (seven) days. 12 capsule 0   No current facility-administered medications for this visit.    No Known Allergies   Review of Systems: All systems reviewed and negative except where noted in HPI.    Dg Hip Unilat With Pelvis 2-3 Views Right  Result Date: 04/30/2016 CLINICAL DATA:  RIGHT groin pain for 1 month, no known injury EXAM: DG HIP (WITH OR WITHOUT PELVIS) 2-3V RIGHT COMPARISON:  None. FINDINGS: Osseous demineralization. Joint spaces preserved. No acute  fracture, dislocation, or bone destruction. IMPRESSION: No acute osseous abnormalities. Electronically Signed   By: Lavonia Dana M.D.   On: 04/30/2016 12:37    Physical Exam: BP 140/80   Pulse 66   Ht 5\' 4"  (1.626 m)   Wt 128 lb 9.6 oz (58.3 kg)   BMI 22.07 kg/m  Constitutional: Pleasant,well-developed, white female in no acute distress. HEENT: Normocephalic and atraumatic. Conjunctivae are normal. No scleral icterus. Neck supple.  Cardiovascular: Normal rate, regular rhythm.  Pulmonary/chest: Effort normal and breath sounds normal. No wheezing, rales or rhonchi. Abdominal: Soft, nondistended, nontender. Bowel sounds active throughout. There are no masses palpable. No hepatomegaly. Extremities: no edema Lymphadenopathy: No cervical adenopathy noted. Neurological: Alert and oriented to person place and time. Skin: Skin is warm and dry. No rashes noted. Psychiatric: Normal mood and affect. Behavior is normal.   ASSESSMENT AND PLAN:  1. GERD. PCP changed her from prilosec to ranitidine 6 months ago (? due to concern about bone density). She has breakthrough heartburn 2-3 times a week on 150 mg zantac bid.   -Add gaviscon as needed 2-3 times a week. -GERD brochure   2. Bowel changes. Has a BM daily but at least one other time during day patient has sensation that she needs to have a BM but doesn't.  She may be experiencing sensation of incomplete evacuation with IBS.  -Trial of glycerin suppositories an needed.  -Continue citrucel   3. RLQ pain, suspect musculoskeletal based on history and exam. She has chronic back problems as well.  4. [facility] of colon cancer in sister. Patient is due for 5 year surveillance exam. She will be scheduled for screening colonoscopy with possible polypectomy. The risks and benefits of the procedure were discussed and the patient agrees to proceed.     Binnie Rail, MD

## 2016-05-22 NOTE — Patient Instructions (Addendum)
If you are age 77 or older, your body mass index should be between 23-30. Your Body mass index is 22.07 kg/m. If this is out of the aforementioned range listed, please consider follow up with your Primary Care Provider.  If you are age 53 or younger, your body mass index should be between 19-25. Your Body mass index is 22.07 kg/m. If this is out of the aformentioned range listed, please consider follow up with your Primary Care Provider.   Please use Glycerin suppositories over the counter, as needed.  Continue Zantac as directed.  You have been scheduled for a colonoscopy. Please follow written instructions given to you at your visit today.  Please pick up your prep supplies at the pharmacy within the next 1-3 days. If you use inhalers (even only as needed), please bring them with you on the day of your procedure. Your physician has requested that you go to www.startemmi.com and enter the access code given to you at your visit today. This web site gives a general overview about your procedure. However, you should still follow specific instructions given to you by our office regarding your preparation for the procedure.  Thank you for choosing [facility]

## 2016-05-23 NOTE — Progress Notes (Signed)
Reviewed and agree with management plan.  Marleny Faller T. Loukisha Gunnerson, MD FACG 

## 2016-06-12 ENCOUNTER — Telehealth: Payer: Self-pay | Admitting: Gastroenterology

## 2016-06-12 NOTE — Telephone Encounter (Signed)
Patient notified that she is not due to for routine EGD

## 2016-06-24 ENCOUNTER — Encounter: Payer: Self-pay | Admitting: Gastroenterology

## 2016-06-24 ENCOUNTER — Ambulatory Visit (AMBULATORY_SURGERY_CENTER): Payer: PPO | Admitting: Gastroenterology

## 2016-06-24 VITALS — BP 115/70 | HR 53 | Temp 98.6°F | Resp 12 | Ht 64.0 in | Wt 128.0 lb

## 2016-06-24 DIAGNOSIS — Z1212 Encounter for screening for malignant neoplasm of rectum: Secondary | ICD-10-CM

## 2016-06-24 DIAGNOSIS — Z800 Family history of malignant neoplasm of digestive organs: Secondary | ICD-10-CM

## 2016-06-24 DIAGNOSIS — I10 Essential (primary) hypertension: Secondary | ICD-10-CM | POA: Diagnosis not present

## 2016-06-24 DIAGNOSIS — K219 Gastro-esophageal reflux disease without esophagitis: Secondary | ICD-10-CM | POA: Diagnosis not present

## 2016-06-24 DIAGNOSIS — Z1211 Encounter for screening for malignant neoplasm of colon: Secondary | ICD-10-CM

## 2016-06-24 MED ORDER — SODIUM CHLORIDE 0.9 % IV SOLN: 500.0000 mL | INTRAVENOUS | Status: DC

## 2016-06-24 NOTE — Progress Notes (Signed)
Report given to PACU RN, vss 

## 2016-06-24 NOTE — Patient Instructions (Signed)
YOU HAD AN ENDOSCOPIC PROCEDURE TODAY AT [facility]:   Refer to the procedure report that was given to you for any specific questions about what was found during the examination.  If the procedure report does not answer your questions, please call your gastroenterologist to clarify.  If you requested that your care partner not be given the details of your procedure findings, then the procedure report has been included in a sealed envelope for you to review at your convenience later.  YOU SHOULD EXPECT: Some feelings of bloating in the abdomen. Passage of more gas than usual.  Walking can help get rid of the air that was put into your GI tract during the procedure and reduce the bloating. If you had a lower endoscopy (such as a colonoscopy or flexible sigmoidoscopy) you may notice spotting of blood in your stool or on the toilet paper. If you underwent a bowel prep for your procedure, you may not have a normal bowel movement for a few days.  Please Note:  You might notice some irritation and congestion in your nose or some drainage.  This is from the oxygen used during your procedure.  There is no need for concern and it should clear up in a day or so.  SYMPTOMS TO REPORT IMMEDIATELY:   Following lower endoscopy (colonoscopy or flexible sigmoidoscopy):  Excessive amounts of blood in the stool  Significant tenderness or worsening of abdominal pains  Swelling of the abdomen that is new, acute  Fever of 100F or higher   Following upper endoscopy (EGD)  Vomiting of blood or coffee ground material  New chest pain or pain under the shoulder blades  Painful or persistently difficult swallowing  New shortness of breath  Fever of 100F or higher  Black, tarry-looking stools  For urgent or emergent issues, a gastroenterologist can be reached at any hour by calling [phone].   DIET:  We do recommend a small meal at first, but then you may proceed to your regular diet.  Drink  plenty of fluids but you should avoid alcoholic beverages for 24 hours.  ACTIVITY:  You should plan to take it easy for the rest of today and you should NOT DRIVE or use heavy machinery until tomorrow (because of the sedation medicines used during the test).    FOLLOW UP: Our staff will call the number listed on your records the next business day following your procedure to check on you and address any questions or concerns that you may have regarding the information given to you following your procedure. If we do not reach you, we will leave a message.  However, if you are feeling well and you are not experiencing any problems, there is no need to return our call.  We will assume that you have returned to your regular daily activities without incident.  If any biopsies were taken you will be contacted by phone or by letter within the next 1-3 weeks.  Please call us at [phone] if you have not heard about the biopsies in 3 weeks.    SIGNATURES/CONFIDENTIALITY: You and/or your care partner have signed paperwork which will be entered into your electronic medical record.  These signatures attest to the fact that that the information above on your After Visit Summary has been reviewed and is understood.  Full responsibility of the confidentiality of this discharge information lies with you and/or your care-partner.   Handouts were given to your care partner on hemorrhoids, diverticulosis,  and a high fiber diet with liberal fluid intake. You may resume your current medications today. Please call if any questions or concerns.

## 2016-06-24 NOTE — Progress Notes (Signed)
No problems noted in the recovery room. maw 

## 2016-06-24 NOTE — Op Note (Signed)
[facility] Patient Name: [name] [name] Procedure Date: 06/24/2016 2:54 PM MRN: [id number] Endoscopist: Ladene Artist , MD Age: 75 Referring MD:  Date of Birth: [birth year] Gender: Female Account #: [id number] Procedure:                Colonoscopy Indications:              Screening in patient at increased risk: Family                            history of 1st-degree relative with colorectal                            cancer Medicines:                Monitored Anesthesia Care Procedure:                Pre-Anesthesia Assessment:                           - Prior to the procedure, a History and Physical                            was performed, and patient medications and                            allergies were reviewed. The patient's tolerance of                            previous anesthesia was also reviewed. The risks                            and benefits of the procedure and the sedation                            options and risks were discussed with the patient.                            All questions were answered, and informed consent                            was obtained. Prior Anticoagulants: The patient has                            taken no previous anticoagulant or antiplatelet                            agents. ASA Grade Assessment: II - A patient with                            mild systemic disease. After reviewing the risks                            and benefits, the patient was deemed in  satisfactory condition to undergo the procedure.                           After obtaining informed consent, the colonoscope                            was passed under direct vision. Throughout the                            procedure, the patient's blood pressure, pulse, and                            oxygen saturations were monitored continuously. The                            Model PCF-[id number]) scope was introduced                    through the anus and advanced to the the cecum,                            identified by appendiceal orifice and ileocecal                            valve. The ileocecal valve, appendiceal orifice,                            and rectum were photographed. The quality of the                            bowel preparation was good. The colonoscopy was                            performed without difficulty. The patient tolerated                            the procedure well. Scope In: 3:09:38 PM Scope Out: 3:22:41 PM Scope Withdrawal Time: 0 hours 10 minutes 24 seconds  Total Procedure Duration: 0 hours 13 minutes 3 seconds  Findings:                 The perianal exam findings include non-thrombosed                            external hemorrhoids.                           A few medium-mouthed diverticula were found in the                            sigmoid colon and transverse colon.                           Internal hemorrhoids were found during  retroflexion. The hemorrhoids were small and Grade                            I (internal hemorrhoids that do not prolapse).                           The exam was otherwise without abnormality on                            direct and retroflexion views. Complications:            No immediate complications. Estimated blood loss:                            None. Estimated Blood Loss:     Estimated blood loss: none. Impression:               - Non-thrombosed external hemorrhoids found on                            perianal exam.                           - Diverticulosis in the sigmoid colon and in the                            transverse colon.                           - Internal hemorrhoids.                           - The examination was otherwise normal on direct                            and retroflexion views.                           - No specimens collected. Recommendation:           -  Patient has a contact number available for                            emergencies. The signs and symptoms of potential                            delayed complications were discussed with the                            patient. Return to normal activities tomorrow.                            Written discharge instructions were provided to the                            patient.                           -  Resume previous diet.                           - Continue present medications.                           - No repeat colonoscopy due to age. Ladene Artist, MD 06/24/2016 3:25:47 PM This report has been signed electronically.

## 2016-06-25 ENCOUNTER — Telehealth: Payer: Self-pay | Admitting: *Deleted

## 2016-06-25 NOTE — Telephone Encounter (Signed)
  Follow up Call-  Call back number 06/24/2016  Post procedure Call Back phone  # [phone]  Permission to leave phone message Yes  Some recent data might be hidden     Patient questions:  Do you have a fever, pain , or abdominal swelling? No. Pain Score  0 *  Have you tolerated food without any problems? Yes.    Have you been able to return to your normal activities? Yes.    Do you have any questions about your discharge instructions: Diet   No. Medications  No. Follow up visit  No.  Do you have questions or concerns about your Care? No.  Actions: * If pain score is 4 or above: No action needed, pain <4.

## 2016-07-29 ENCOUNTER — Other Ambulatory Visit: Payer: Self-pay | Admitting: Obstetrics and Gynecology

## 2016-07-29 DIAGNOSIS — Z1231 Encounter for screening mammogram for malignant neoplasm of breast: Secondary | ICD-10-CM

## 2016-08-05 ENCOUNTER — Ambulatory Visit
Admission: RE | Admit: 2016-08-05 | Discharge: 2016-08-05 | Disposition: A | Discharge status: Home / Self Care | Payer: PPO | Source: Ambulatory Visit | Attending: Obstetrics and Gynecology | Admitting: Obstetrics and Gynecology

## 2016-08-05 DIAGNOSIS — Z1231 Encounter for screening mammogram for malignant neoplasm of breast: Secondary | ICD-10-CM

## 2016-08-22 ENCOUNTER — Telehealth: Payer: Self-pay | Admitting: Emergency Medicine

## 2016-08-22 NOTE — Telephone Encounter (Signed)
I have summary of benefits for prolia injection--estimated $215 copay BUT patient's insurance is requiring a PA approval, I have requested PA form to be sent to our office, which we will complete and send to patients insurance co---could take appx 2-4 weeks to get approval--patient advised----I will call patient back after I get PA form approval

## 2016-08-22 NOTE — Telephone Encounter (Signed)
Pt called and wants to know if you can give her a call back about her prolia injection and other question. Please give her a call back thanks.

## 2016-09-04 ENCOUNTER — Telehealth: Payer: Self-pay

## 2016-09-04 NOTE — Telephone Encounter (Signed)
Advised patient of summary of benefits for prolia injection---estimated $215 copay---patient is due for next injection now, can schedule anytime at her convenience---patient advised, she will call back to schedule nurse visit

## 2016-09-10 ENCOUNTER — Ambulatory Visit (INDEPENDENT_AMBULATORY_CARE_PROVIDER_SITE_OTHER): Payer: PPO

## 2016-09-10 DIAGNOSIS — M810 Age-related osteoporosis without current pathological fracture: Secondary | ICD-10-CM

## 2016-09-10 MED ORDER — DENOSUMAB 60 MG/ML ~~LOC~~ SOLN
60.0000 mg | Freq: Once | SUBCUTANEOUS | Status: AC
  Administered 2016-09-10: 60 mg via SUBCUTANEOUS

## 2016-09-10 NOTE — Progress Notes (Signed)
prolia Injection given.   Stacy J Burns, MD  

## 2016-09-11 ENCOUNTER — Telehealth: Payer: Self-pay

## 2016-09-11 NOTE — Telephone Encounter (Signed)
Patient came in (nurse visit) and recd prolia injection, she is also member of [employer] (patient assistance program for medicare pts)---tamara has [employer] forms on her desk and am waiting for patient to bring copy of her EOB after she receives it in the mail---tamara will take a copy of EOB along with [employer] paperwork and fax over to foundation for patient to receive reimbursement for $215 copay she made day of her prolia injection---any questions, see tamara

## 2016-10-29 NOTE — Patient Instructions (Addendum)

## 2016-10-29 NOTE — Progress Notes (Signed)
Subjective:    Patient ID: [name] [name], female    DOB: 12/03/1940, 76 y.o.   MRN: [id number]  HPI The patient is here for follow up.  Hypertension: She is taking her medication daily. She is compliant with a low sodium diet.  She denies chest pain, regular palpitations, edema, shortness of breath and regular headaches. She is exercising irregularly.      Hyperlipidemia: She is taking her medication daily. She is compliant with a low fat/cholesterol diet. She is exercising irregularly. She denies myalgias.   GERD:  She is taking her medication daily as prescribed.  She has GERD at least twice a week.  She takes Tums as needed.  She went off prilosec due to osteoporosis.  Prediabetes:  She is fairly compliant with a low sugar/carbohydrate diet - she struggles with carbohydrates.  She is exercising irregularly.   Medications and allergies reviewed with patient and updated if appropriate.  Patient Active Problem List   Diagnosis Date Noted  . Thyroid antibody positive 05/04/2016  . Right groin pain 04/30/2016  . Change in bowel habits 04/30/2016  . Prediabetes 11/01/2015  . Spinal stenosis of lumbar region 10/03/2015  . DDD (degenerative disc disease), lumbar   . DJD (degenerative joint disease) of knee   . Hypertension 11/25/2010  . History of colon polyps 05/14/2009  . OBESITY 05/14/2009  . ARTHRALGIA UNSPECIFIED SITE 02/06/2009  . BEN HTN HEART DISEASE WITHOUT HEART FAIL 08/17/2007  . G E R D 08/17/2007  . Hyperlipidemia 08/16/2007  . HIATAL HERNIA 08/16/2007  . DIVERTICULAR DISEASE 08/16/2007  . IBS 08/16/2007  . Osteoporosis 08/16/2007    Current Outpatient Prescriptions on File Prior to Visit  Medication Sig Dispense Refill  . acetaminophen (TYLENOL) 325 MG tablet Take 650 mg by mouth every 4 (four) hours as needed.    [name] acetic acid-hydrocortisone (VOSOL-HC) otic solution Place 3 drops into both ears 2 (two) times daily as needed. 30 mL 1  . aspirin 81 MG EC tablet  Take 81 mg by mouth daily. Ecotrin    . Biotin 5 MG CAPS Taking daily  0  . bisoprolol-hydrochlorothiazide (ZIAC) 5-6.25 MG tablet Take 1 tablet by mouth daily. 90 tablet 3  . calcium-vitamin D (OSCAL WITH D) 500-200 MG-UNIT per tablet Take 1 tablet by mouth 2 (two) times daily.     . clobetasol ointment (TEMOVATE) 2.11 % Apply 1 application topically 2 (two) times daily.    [name] ibuprofen (ADVIL,MOTRIN) 200 MG tablet Take 600 mg by mouth 3 (three) times daily. With meals    . Multiple Vitamins-Minerals (CENTRUM SILVER PO) Take by mouth daily.      [name] nystatin cream (MYCOSTATIN) Apply 1 application topically 2 (two) times daily as needed.    . pravastatin (PRAVACHOL) 10 MG tablet Take one tablet three times a week 36 tablet 3  . ranitidine (ZANTAC) 150 MG tablet Take 1 tablet (150 mg total) by mouth 2 (two) times daily. 180 tablet 3   No current facility-administered medications on file prior to visit.     Past Medical History:  Diagnosis Date  . Benign neoplasm of colon   . Cataract    bilateral,had surgery  . DDD (degenerative disc disease), lumbar    MRI w. [facility] 05/2015, s/p ESI x 1 w/ improvement  . Diaphragmatic hernia without mention of obstruction or gangrene   . Diverticulosis of colon (without mention of hemorrhage)   . DJD (degenerative joint disease) of knee  L>R, IA cortisone by [facility] in L 03/2015  . Esophageal reflux   . Gallstones   . History of chicken pox   . Hyperlipidemia   . Hypertension   . Irritable bowel syndrome   . Migraines   . Obesity, unspecified   . Osteoporosis, unspecified   . Tachycardia   . Unspecified hemorrhoids with other complication     Past Surgical History:  Procedure Laterality Date  . APPENDECTOMY  1979  . CHOLECYSTECTOMY  1989  . COLONOSCOPY    . POLYPECTOMY    . VAGINAL HYSTERECTOMY  1996    Social History   Social History  . Marital status: Married    Spouse name: N/A  . Number of children: N/A  . Years of  education: N/A   Social History Main Topics  . Smoking status: Never Smoker  . Smokeless tobacco: Never Used  . Alcohol use No  . Drug use: No  . Sexual activity: Not on file   Other Topics Concern  . Not on file   Social History Narrative   Exercise: no regular exercise    Family History  Problem Relation Age of Onset  . Heart disease Sister     x 2  . Heart disease Brother   . Stroke Mother   . Colon cancer Sister   . Stroke Sister 8    Review of Systems  Constitutional: Negative for chills and fever.  Respiratory: Negative for cough, shortness of breath and wheezing.   Cardiovascular: Positive for palpitations (rare). Negative for chest pain and leg swelling.  Gastrointestinal: Negative for abdominal pain.  Neurological: Negative for light-headedness and headaches.       Objective:   Vitals:   10/30/16 0938  BP: 122/78  Pulse: (!) 59  Temp: 97.7 F (36.5 C)   Wt Readings from Last 3 Encounters:  10/30/16 129 lb (58.5 kg)  06/24/16 128 lb (58.1 kg)  05/22/16 128 lb 9.6 oz (58.3 kg)   Body mass index is 22.14 kg/m.   Physical Exam    Constitutional: Appears well-developed and well-nourished. No distress.  HENT:  Head: Normocephalic and atraumatic.  Neck: Neck supple. No tracheal deviation present. No thyromegaly present.  No cervical lymphadenopathy Cardiovascular: Normal rate, regular rhythm and normal heart sounds.   No murmur heard. No carotid bruit .  No edema Pulmonary/Chest: Effort normal and breath sounds normal. No respiratory distress. No has no wheezes. No rales.  Skin: Skin is warm and dry. Not diaphoretic.  Psychiatric: Normal mood and affect. Behavior is normal.      Assessment & Plan:    See Problem List for Assessment and Plan of chronic medical problems.    FU in 6 months

## 2016-10-30 ENCOUNTER — Other Ambulatory Visit: Payer: Self-pay | Admitting: Internal Medicine

## 2016-10-30 ENCOUNTER — Other Ambulatory Visit (INDEPENDENT_AMBULATORY_CARE_PROVIDER_SITE_OTHER): Payer: PPO

## 2016-10-30 ENCOUNTER — Encounter: Payer: Self-pay | Admitting: Internal Medicine

## 2016-10-30 ENCOUNTER — Ambulatory Visit (INDEPENDENT_AMBULATORY_CARE_PROVIDER_SITE_OTHER): Payer: PPO | Admitting: Internal Medicine

## 2016-10-30 VITALS — BP 122/78 | HR 59 | Temp 97.7°F | Ht 64.0 in | Wt 129.0 lb

## 2016-10-30 DIAGNOSIS — R768 Other specified abnormal immunological findings in serum: Secondary | ICD-10-CM

## 2016-10-30 DIAGNOSIS — R7303 Prediabetes: Secondary | ICD-10-CM | POA: Diagnosis not present

## 2016-10-30 DIAGNOSIS — E7800 Pure hypercholesterolemia, unspecified: Secondary | ICD-10-CM

## 2016-10-30 DIAGNOSIS — I10 Essential (primary) hypertension: Secondary | ICD-10-CM

## 2016-10-30 DIAGNOSIS — M810 Age-related osteoporosis without current pathological fracture: Secondary | ICD-10-CM | POA: Diagnosis not present

## 2016-10-30 DIAGNOSIS — R760 Raised antibody titer: Secondary | ICD-10-CM

## 2016-10-30 DIAGNOSIS — E063 Autoimmune thyroiditis: Principal | ICD-10-CM

## 2016-10-30 DIAGNOSIS — E038 Other specified hypothyroidism: Secondary | ICD-10-CM

## 2016-10-30 DIAGNOSIS — K219 Gastro-esophageal reflux disease without esophagitis: Secondary | ICD-10-CM

## 2016-10-30 LAB — COMPREHENSIVE METABOLIC PANEL
ALK PHOS: 40 U/L (ref 39–117)
ALT: 12 U/L (ref 0–35)
AST: 16 U/L (ref 0–37)
Albumin: 4.4 g/dL (ref 3.5–5.2)
BILIRUBIN TOTAL: 1.5 mg/dL — AB (ref 0.2–1.2)
BUN: 14 mg/dL (ref 6–23)
CO2: 33 mEq/L — ABNORMAL HIGH (ref 19–32)
Calcium: 9.6 mg/dL (ref 8.4–10.5)
Chloride: 102 mEq/L (ref 96–112)
Creatinine, Ser: 0.71 mg/dL (ref 0.40–1.20)
GFR: 84.71 mL/min (ref >60.00)
GLUCOSE: 98 mg/dL (ref 70–99)
Potassium: 3.7 mEq/L (ref 3.5–5.1)
SODIUM: 139 meq/L (ref 135–145)
TOTAL PROTEIN: 7.6 g/dL (ref 6.0–8.3)

## 2016-10-30 LAB — LIPID PANEL
Cholesterol: 212 mg/dL — ABNORMAL HIGH (ref 0–200)
HDL: 57.8 mg/dL (ref >39.00)
LDL Cholesterol: 127 mg/dL — ABNORMAL HIGH (ref 0–99)
NonHDL: 154.15
TRIGLYCERIDES: 138 mg/dL (ref 0.0–149.0)
Total CHOL/HDL Ratio: 4
VLDL: 27.6 mg/dL (ref 0.0–40.0)

## 2016-10-30 LAB — HEMOGLOBIN A1C: HEMOGLOBIN A1C: 5.7 % (ref 4.6–6.5)

## 2016-10-30 LAB — VITAMIN D 25 HYDROXY (VIT D DEFICIENCY, FRACTURES): VITD: 45.84 ng/mL (ref 30.00–100.00)

## 2016-10-30 NOTE — Telephone Encounter (Signed)
Call her regarding blood work  tsh was not done - ordered - please add on / ask lab why it was not done.  Vitamin D level is very good.  Sugars are in the prediabetic range. Kidney and liver tests are normal.  Cholesterol slightly better than last year - I think she can improve it further if she increases her walking.

## 2016-10-30 NOTE — Assessment & Plan Note (Signed)
Not ideally controlled -trying to avoid prilosec due to OP continue zantac twice daily, tums If GERD occurs a few times a week may need to consider going back on low dose prilosec

## 2016-10-30 NOTE — Assessment & Plan Note (Signed)
BP well controlled Current regimen effective and well tolerated Continue current medications at current doses cmp  

## 2016-10-30 NOTE — Telephone Encounter (Signed)
Lab add on sheet has been faxed for TSH.

## 2016-10-30 NOTE — Assessment & Plan Note (Signed)
Check tsh 

## 2016-10-30 NOTE — Assessment & Plan Note (Signed)
Check lipid panel  Continue daily statin - consider increasing pravastatin - currently taking 3 / week Regular exercise and healthy diet encouraged

## 2016-10-30 NOTE — Assessment & Plan Note (Signed)
Check a1c Low sugar / carb diet Stressed regular exercise, keeping weight down  

## 2016-10-30 NOTE — Progress Notes (Signed)
Pre visit review using our clinic review tool, if applicable. No additional management support is needed unless otherwise documented below in the visit note. 

## 2016-10-30 NOTE — Assessment & Plan Note (Addendum)
Last prolia 09/10/16 Taking calcium and vitamin d Advised increasing walking

## 2016-10-31 ENCOUNTER — Other Ambulatory Visit (INDEPENDENT_AMBULATORY_CARE_PROVIDER_SITE_OTHER): Payer: PPO

## 2016-10-31 DIAGNOSIS — R7303 Prediabetes: Secondary | ICD-10-CM | POA: Diagnosis not present

## 2016-10-31 DIAGNOSIS — E7800 Pure hypercholesterolemia, unspecified: Secondary | ICD-10-CM

## 2016-10-31 DIAGNOSIS — R760 Raised antibody titer: Secondary | ICD-10-CM | POA: Diagnosis not present

## 2016-10-31 DIAGNOSIS — R768 Other specified abnormal immunological findings in serum: Secondary | ICD-10-CM

## 2016-10-31 DIAGNOSIS — I10 Essential (primary) hypertension: Secondary | ICD-10-CM

## 2016-10-31 DIAGNOSIS — M810 Age-related osteoporosis without current pathological fracture: Secondary | ICD-10-CM

## 2016-10-31 LAB — TSH: TSH: 7.22 u[IU]/mL — ABNORMAL HIGH (ref 0.35–4.50)

## 2016-10-31 NOTE — Telephone Encounter (Signed)
LVM informing pt of results. TSH was ordered via add on

## 2016-11-01 DIAGNOSIS — E039 Hypothyroidism, unspecified: Secondary | ICD-10-CM | POA: Insufficient documentation

## 2016-11-01 NOTE — Telephone Encounter (Signed)
Her tyroid is underactive and I would recommend starting medication for her thyroid.  Having an underactive thyroid is not good for her bones.  We would start at 25 mcg daily - take first thing in the morning on an empty stomach. Wait one hour before eating or any other medications and do not take vitamins within 4 hours.    Repeat tsh in 6-8 weeks.     Labs pending if she is ok with this.

## 2016-11-03 ENCOUNTER — Telehealth: Payer: Self-pay | Admitting: Internal Medicine

## 2016-11-03 DIAGNOSIS — K219 Gastro-esophageal reflux disease without esophagitis: Secondary | ICD-10-CM

## 2016-11-03 NOTE — Telephone Encounter (Signed)
Tried contacting pt, LVM for pt to return call.

## 2016-11-03 NOTE — Telephone Encounter (Signed)
pravastatin (PRAVACHOL) 10 MG tablet  bisoprolol-hydrochlorothiazide (ZIAC) 5-6.25 MG tablet  ranitidine (ZANTAC) 150 MG   Please send to her new pharmacy  [facility] on [address]

## 2016-11-03 NOTE — Telephone Encounter (Signed)
Patient called back. I informed her of the notes. She kinda understood. But wanted to talk to Lovena Le more about it in details. Can you follow up with her. Thank you.

## 2016-11-04 MED ORDER — BISOPROLOL-HYDROCHLOROTHIAZIDE 5-6.25 MG PO TABS: 1.0000 | ORAL_TABLET | Freq: Every day | ORAL | 3 refills | Status: DC

## 2016-11-04 MED ORDER — LEVOTHYROXINE SODIUM 25 MCG PO CAPS: 25.0000 ug | ORAL_CAPSULE | Freq: Every day | ORAL | 1 refills | Status: DC

## 2016-11-04 MED ORDER — PRAVASTATIN SODIUM 10 MG PO TABS: ORAL_TABLET | ORAL | 3 refills | Status: DC

## 2016-11-04 MED ORDER — RANITIDINE HCL 150 MG PO TABS: 150.0000 mg | ORAL_TABLET | Freq: Two times a day (BID) | ORAL | 3 refills | Status: DC

## 2016-11-04 NOTE — Telephone Encounter (Signed)
Spoke with pt to inform. Pt was okay with having RX sent for thyroid. Other refills sent at pts request.

## 2016-11-04 NOTE — Telephone Encounter (Signed)
RXs have been sent to [facility], pt is aware.

## 2016-11-18 ENCOUNTER — Telehealth: Payer: Self-pay | Admitting: Internal Medicine

## 2016-11-18 NOTE — Telephone Encounter (Signed)
Called patient to schedule awv. Lvm for patient to call office to schedule appt.  °

## 2016-11-25 NOTE — Progress Notes (Addendum)
Subjective:   [name] [name] is a 76 y.o. female who presents for Medicare Annual (Subsequent) preventive examination.  Review of Systems:  No ROS.  Medicare Wellness Visit.  Cardiac Risk Factors include: advanced age (>60men, >35 women);dyslipidemia;hypertension Sleep patterns: no sleep issues, feels rested on waking, gets up 1 times nightly to void and sleeps 6-7 hours nightly.   Home Safety/Smoke Alarms: Feels safe in home. Smoke alarms in place.    Living environment; residence and Firearm Safety: 2-story house, no firearms.lives  with husband, no needs for DME Seat Belt Safety/Bike Helmet: Wears seat belt.   Counseling:   Eye Exam- appointment yearly Dental- every 6 months  Female:   Pap-   N/A     Mammo- Last 08/05/16, BI-RADS CATEGORY  1: Negative      Dexa scan- Last 06/26/17, osteoporosis      CCS- Last 08/05/16, recall 5 years     Objective:     Vitals: BP 133/65   Pulse (!) 57   Resp 20   Ht 5\' 4"  (1.626 m)   Wt 132 lb (59.9 kg)   SpO2 98%   BMI 22.66 kg/m   Body mass index is 22.66 kg/m.   Tobacco History  Smoking Status  . Never Smoker  Smokeless Tobacco  . Never Used     Counseling given: Not Answered   Past Medical History:  Diagnosis Date  . Benign neoplasm of colon   . Cataract    bilateral,had surgery  . DDD (degenerative disc disease), lumbar    MRI w. [facility] 05/2015, s/p ESI x 1 w/ improvement  . Diaphragmatic hernia without mention of obstruction or gangrene   . Diverticulosis of colon (without mention of hemorrhage)   . DJD (degenerative joint disease) of knee    L>R, IA cortisone by [facility] in L 03/2015  . Esophageal reflux   . Gallstones   . History of chicken pox   . Hyperlipidemia   . Hypertension   . Irritable bowel syndrome   . Migraines   . Obesity, unspecified   . Osteoporosis, unspecified   . Tachycardia   . Unspecified hemorrhoids with other complication    Past Surgical History:  Procedure Laterality Date  .  APPENDECTOMY  1979  . CHOLECYSTECTOMY  1989  . COLONOSCOPY    . POLYPECTOMY    . VAGINAL HYSTERECTOMY  1996   Family History  Problem Relation Age of Onset  . Heart disease Sister     x 2  . Heart disease Brother   . Stroke Mother   . Colon cancer Sister   . Stroke Sister 64   History  Sexual Activity  . Sexual activity: Not on file    Outpatient Encounter Prescriptions as of 11/26/2016  Medication Sig  . acetaminophen (TYLENOL) 325 MG tablet Take 650 mg by mouth every 4 (four) hours as needed.  [name] acetic acid-hydrocortisone (VOSOL-HC) otic solution Place 3 drops into both ears 2 (two) times daily as needed.  [name] aspirin 81 MG EC tablet Take 81 mg by mouth daily. Ecotrin  . Biotin 5 MG CAPS Taking daily  . bisoprolol-hydrochlorothiazide (ZIAC) 5-6.25 MG tablet Take 1 tablet by mouth daily.  . calcium-vitamin D (OSCAL WITH D) 500-200 MG-UNIT per tablet Take 1 tablet by mouth 2 (two) times daily.   . clobetasol ointment (TEMOVATE) 3.90 % Apply 1 application topically 2 (two) times daily.  [name] denosumab (PROLIA) 60 MG/ML SOLN injection Inject 60 mg into the  skin every 6 (six) months. Administer in upper arm, thigh, or abdomen  . ibuprofen (ADVIL,MOTRIN) 200 MG tablet Take 600 mg by mouth 3 (three) times daily. With meals  . Levothyroxine Sodium 25 MCG CAPS Take 1 capsule (25 mcg total) by mouth daily before breakfast.  . Multiple Vitamins-Minerals (CENTRUM SILVER PO) Take by mouth daily.    [name] nystatin cream (MYCOSTATIN) Apply 1 application topically 2 (two) times daily as needed.  . phenylephrine-shark liver oil-mineral oil-petrolatum (PREPARATION H) 0.25-3-14-71.9 % rectal ointment Place 1 application rectally 2 (two) times daily as needed for hemorrhoids.  . pravastatin (PRAVACHOL) 10 MG tablet Take one tablet three times a week  . ranitidine (ZANTAC) 150 MG tablet Take 1 tablet (150 mg total) by mouth 2 (two) times daily.   No facility-administered encounter medications on file as of  11/26/2016.     Activities of Daily Living In your present state of health, do you have any difficulty performing the following activities: 11/26/2016  Hearing? N  Vision? N  Difficulty concentrating or making decisions? N  Walking or climbing stairs? N  Dressing or bathing? N  Doing errands, shopping? N  Preparing Food and eating ? N  Using the Toilet? N  In the past six months, have you accidently leaked urine? Y  Do you have problems with loss of bowel control? N  Managing your Medications? N  Managing your Finances? N  Housekeeping or managing your Housekeeping? N  Some recent data might be hidden    Patient Care Team: Binnie Rail, MD as PCP - General (Internal Medicine) Ladene Artist, MD (Gastroenterology) Molli Posey, MD (Obstetrics and Gynecology) Luberta Mutter, MD (Ophthalmology) Josue Hector, MD (Cardiology) Netta Cedars, MD (Orthopedic Surgery) Suella Broad, MD (Physical Medicine and Rehabilitation)    Assessment:    Physical assessment deferred to PCP.  Exercise Activities and Dietary recommendations Current Exercise Habits: The patient has a physically strenous job, but has no regular exercise apart from work., Exercise limited by: None identified  Diet (meal preparation, eat out, water intake, caffeinated beverages, dairy products, fruits and vegetables): in general, a "healthy" diet  , well balanced, low fat/ cholesterol, low salt , no caffeine, limits sugar, drinks 2-3 glasses of water daily.  Encouraged patient to increase daily water intake.   Goals    . Maintain current health status          Continue to be active, eat healthy, enjoy family and enjoy life.    . Weight < 200 lb (90.719 kg)          Wants to lose 10lbs; would like to come off of some medication. Would like to stop taking chol. Educate appropriately      Fall Risk Fall Risk  11/26/2016 04/30/2016 10/31/2014 06/09/2014  Falls in the past year? Yes No No No  Number falls in  past yr: 1 - - -  Injury with Fall? No - - -  Risk for fall due to : - - (No Data) -  Risk for fall due to (comments): - - States she is careful; aware of fall risk -  Follow up Education provided;Falls prevention discussed - - -   Depression Screen PHQ 2/9 Scores 11/26/2016 04/30/2016 10/31/2014 06/09/2014  PHQ - 2 Score 0 0 0 0     Cognitive Function MMSE - Mini Mental State Exam 11/26/2016  Orientation to time 5  Orientation to Place 5  Registration 3  Attention/ Calculation 5  Recall 2  Language- name 2 objects 2  Language- repeat 1  Language- follow 3 step command 3  Language- read & follow direction 1  Write a sentence 1  Copy design 1  Total score 29        Immunization History  Administered Date(s) Administered  . Influenza Split 05/28/2011, 06/23/2012  . Influenza Whole 05/25/2007, 05/31/2010  . Influenza, High Dose Seasonal PF 06/27/2015, 04/30/2016  . Influenza,inj,Quad PF,36+ Mos 04/20/2013, 06/13/2014  . Pneumococcal Conjugate-13 10/31/2014  . Pneumococcal Polysaccharide-23 10/07/2013  . Td 10/07/2013  . Zoster 10/31/2014   Screening Tests Health Maintenance  Topic Date Due  . INFLUENZA VACCINE  02/25/2017  . COLONOSCOPY  06/24/2021  . TETANUS/TDAP  10/08/2023  . DEXA SCAN  Completed  . PNA vac Low Risk Adult  Completed      Plan:    Continue to eat heart healthy diet (full of fruits, vegetables, whole grains, lean protein, water--limit salt, fat, and sugar intake) and increase physical activity as tolerated.  Continue doing brain stimulating activities (puzzles, reading, adult coloring books, staying active) to keep memory sharp.   Bring a copy of your advance directives to your next office visit.  I have personally reviewed and noted the following in the patient's chart:   . Medical and social history . Use of alcohol, tobacco or illicit drugs  . Current medications and supplements . Functional ability and status . Nutritional status . Physical  activity . Advanced directives . List of other physicians . Hospitalizations, surgeries, and ER visits in previous 12 months . Vitals . Screenings to include cognitive, depression, and falls . Referrals and appointments  In addition, I have reviewed and discussed with patient certain preventive protocols, quality metrics, and best practice recommendations. A written personalized care plan for preventive services as well as general preventive health recommendations were provided to patient.     Michiel Cowboy, RN  11/26/2016   Medical screening examination/treatment/procedure(s) were performed by non-physician practitioner and as supervising physician I was immediately available for consultation/collaboration. I agree with above. Binnie Rail, MD

## 2016-11-25 NOTE — Progress Notes (Signed)
Pre visit review using our clinic review tool, if applicable. No additional management support is needed unless otherwise documented below in the visit note. 

## 2016-11-26 ENCOUNTER — Ambulatory Visit (INDEPENDENT_AMBULATORY_CARE_PROVIDER_SITE_OTHER): Payer: PPO | Admitting: *Deleted

## 2016-11-26 ENCOUNTER — Telehealth: Payer: Self-pay | Admitting: *Deleted

## 2016-11-26 ENCOUNTER — Telehealth: Payer: Self-pay

## 2016-11-26 VITALS — BP 133/65 | HR 57 | Resp 20 | Ht 64.0 in | Wt 132.0 lb

## 2016-11-26 DIAGNOSIS — Z0000 Encounter for general adult medical examination without abnormal findings: Secondary | ICD-10-CM

## 2016-11-26 NOTE — Patient Instructions (Addendum)
Continue to eat heart healthy diet (full of fruits, vegetables, whole grains, lean protein, water--limit salt, fat, and sugar intake) and increase physical activity as tolerated.  Continue doing brain stimulating activities (puzzles, reading, adult coloring books, staying active) to keep memory sharp.   Bring a copy of your advance directives to your next office visit.   [name] [name] , Thank you for taking time to come for your Medicare Wellness Visit. I appreciate your ongoing commitment to your health goals. Please review the following plan we discussed and let me know if I can assist you in the future.   These are the goals we discussed: Goals    . Maintain current health status          Continue to be active, eat healthy, enjoy family and enjoy life.    . Weight < 200 lb (90.719 kg)          Wants to lose 10lbs; would like to come off of some medication. Would like to stop taking chol. Educate appropriately       This is a list of the screening recommended for you and due dates:  Health Maintenance  Topic Date Due  . Flu Shot  02/25/2017  . Colon Cancer Screening  06/24/2021  . Tetanus Vaccine  10/08/2023  . DEXA scan (bone density measurement)  Completed  . Pneumonia vaccines  Completed    Gastroesophageal Reflux Disease, Adult Normally, food travels down the esophagus and stays in the stomach to be digested. If a person has gastroesophageal reflux disease (GERD), food and stomach acid move back up into the esophagus. When this happens, the esophagus becomes sore and swollen (inflamed). Over time, GERD can make small holes (ulcers) in the lining of the esophagus. Follow these instructions at home: Diet   Follow a diet as told by your doctor. You may need to avoid foods and drinks such as:  Coffee and tea (with or without caffeine).  Drinks that contain alcohol.  Energy drinks and sports drinks.  Carbonated drinks or sodas.  Chocolate and cocoa.  Peppermint and mint  flavorings.  Garlic and onions.  Horseradish.  Spicy and acidic foods, such as peppers, chili powder, curry powder, vinegar, hot sauces, and BBQ sauce.  Citrus fruit juices and citrus fruits, such as oranges, lemons, and limes.  Tomato-based foods, such as red sauce, chili, salsa, and pizza with red sauce.  Fried and fatty foods, such as donuts, french fries, potato chips, and high-fat dressings.  High-fat meats, such as hot dogs, rib eye steak, sausage, ham, and bacon.  High-fat dairy items, such as whole milk, butter, and cream cheese.  Eat small meals often. Avoid eating large meals.  Avoid drinking large amounts of liquid with your meals.  Avoid eating meals during the 2-3 hours before bedtime.  Avoid lying down right after you eat.  Do not exercise right after you eat. General instructions   Pay attention to any changes in your symptoms.  Take over-the-counter and prescription medicines only as told by your doctor. Do not take aspirin, ibuprofen, or other NSAIDs unless your doctor says it is okay.  Do not use any tobacco products, including cigarettes, chewing tobacco, and e-cigarettes. If you need help quitting, ask your doctor.  Wear loose clothes. Do not wear anything tight around your waist.  Raise (elevate) the head of your bed about 6 inches (15 cm).  Try to lower your stress. If you need help doing this, ask your doctor.  If  you are overweight, lose an amount of weight that is healthy for you. Ask your doctor about a safe weight loss goal.  Keep all follow-up visits as told by your doctor. This is important. Contact a doctor if:  You have new symptoms.  You lose weight and you do not know why it is happening.  You have trouble swallowing, or it hurts to swallow.  You have wheezing or a cough that keeps happening.  Your symptoms do not get better with treatment.  You have a hoarse voice. Get help right away if:  You have pain in your arms, neck,  jaw, teeth, or back.  You feel sweaty, dizzy, or light-headed.  You have chest pain or shortness of breath.  You throw up (vomit) and your throw up looks like blood or coffee grounds.  You pass out (faint).  Your poop (stool) is bloody or black.  You cannot swallow, drink, or eat. This information is not intended to replace advice given to you by your health care provider. Make sure you discuss any questions you have with your health care provider. Document Released: 12/31/2007 Document Revised: 12/20/2015 Document Reviewed: 11/08/2014 Elsevier Interactive Patient Education  2017 [place].  High-Fiber Diet Fiber, also called dietary fiber, is a type of carbohydrate found in fruits, vegetables, whole grains, and beans. A high-fiber diet can have many health benefits. Your health care provider may recommend a high-fiber diet to help:  Prevent constipation. Fiber can make your bowel movements more regular.  Lower your cholesterol.  Relieve hemorrhoids, uncomplicated diverticulosis, or irritable bowel syndrome.  Prevent overeating as part of a weight-loss plan.  Prevent heart disease, type 2 diabetes, and certain cancers. What is my plan? The recommended daily intake of fiber includes:  38 grams for men under age 9.  32 grams for men over age 66.  51 grams for women under age 6.  79 grams for women over age 56. You can get the recommended daily intake of dietary fiber by eating a variety of fruits, vegetables, grains, and beans. Your health care provider may also recommend a fiber supplement if it is not possible to get enough fiber through your diet. What do I need to know about a high-fiber diet?  Fiber supplements have not been widely studied for their effectiveness, so it is better to get fiber through food sources.  Always check the fiber content on thenutrition facts label of any prepackaged food. Look for foods that contain at least 5 grams of fiber per  serving.  Ask your dietitian if you have questions about specific foods that are related to your condition, especially if those foods are not listed in the following section.  Increase your daily fiber consumption gradually. Increasing your intake of dietary fiber too quickly may cause bloating, cramping, or gas.  Drink plenty of water. Water helps you to digest fiber. What foods can I eat? Grains  Whole-grain breads. Multigrain cereal. Oats and oatmeal. Brown rice. Barley. Bulgur wheat. [place]. Bran muffins. Popcorn. Rye wafer crackers. Vegetables  Sweet potatoes. Spinach. Kale. Artichokes. Cabbage. Broccoli. Green peas. Carrots. Squash. Fruits  Berries. Pears. Apples. Oranges. Avocados. Prunes and raisins. Dried figs. Meats and Other Protein Sources  [place], kidney, pinto, and soy beans. Split peas. Lentils. Nuts and seeds. Dairy  Fiber-fortified yogurt. Beverages  Fiber-fortified soy milk. Fiber-fortified orange juice. Other  Fiber bars. The items listed above may not be a complete list of recommended foods or beverages. Contact your dietitian for more options.  What foods are not recommended? Grains  White bread. Pasta made with refined flour. White rice. Vegetables  Fried potatoes. Canned vegetables. Well-cooked vegetables. Fruits  Fruit juice. Cooked, strained fruit. Meats and Other Protein Sources  Fatty cuts of meat. Fried Sales executive or fried fish. Dairy  Milk. Yogurt. Cream cheese. Sour cream. Beverages  Soft drinks. Other  Cakes and pastries. Butter and oils. The items listed above may not be a complete list of foods and beverages to avoid. Contact your dietitian for more information.  What are some tips for including high-fiber foods in my diet?  Eat a wide variety of high-fiber foods.  Make sure that half of all grains consumed each day are whole grains.  Replace breads and cereals made from refined flour or white flour with whole-grain breads and  cereals.  Replace white rice with brown rice, bulgur wheat, or millet.  Start the day with a breakfast that is high in fiber, such as a cereal that contains at least 5 grams of fiber per serving.  Use beans in place of meat in soups, salads, or pasta.  Eat high-fiber snacks, such as berries, raw vegetables, nuts, or popcorn. This information is not intended to replace advice given to you by your health care provider. Make sure you discuss any questions you have with your health care provider. Document Released: 07/14/2005 Document Revised: 12/20/2015 Document Reviewed: 12/27/2013 Elsevier Interactive Patient Education  2017 [employer].

## 2016-11-26 NOTE — Telephone Encounter (Signed)
Patient saw AWcoach today and had questions about prolia, I have tried calling patient to help with prolia questions, left message that I am gone this afternoon but would try calling patient tomorrow when I return

## 2016-11-26 NOTE — Telephone Encounter (Signed)
During AWV patient stated that her zantac prescription is not working very well. Patient added that prilosec worked very well for her in the past but it was stopped due to potential side effects from long-term use. She asked is there another medication that may be more effective.

## 2016-11-27 MED ORDER — OMEPRAZOLE 20 MG PO CPDR: 20.0000 mg | DELAYED_RELEASE_CAPSULE | Freq: Every day | ORAL | 3 refills | Status: DC

## 2016-11-27 NOTE — Telephone Encounter (Signed)
There is not another medication - she will probably need to restart omeprazole 20 mg daily.  She can take zantac once a day as well.  It is important to keep the heartburn controlled. Omeprazole is otc but also comes in a prescription - let me know if she needs a prescription

## 2016-11-27 NOTE — Telephone Encounter (Signed)
Called to inform patient that there is not another medication other than zantac. Discussed that she could start omeprazole 20 mg daily and also can continue to take zantac for heartburn. Patient stated that she would like to have a prescription sent to her pharmacy as this would help with the cost of the medicine. A prescription was sent as requested.

## 2016-11-27 NOTE — Telephone Encounter (Signed)
I have called patient, she is wondering why she has not received EOB from her insurance company yet for injection she got 09/10/16---I am def showing this was documented in her chart and should have been billed, patient advised to call her insurance co

## 2016-12-01 NOTE — Telephone Encounter (Signed)
I have left message asking patient to also bring copy of proof of payment for copay---can be either a cancelled check, copy of bank or credit card statement, etc.---whatever documentation she has to prove she paid copay out of pocket---this will need to accompany the EOB she is bringing from her insurance co and Jubilee Vivero will be faxing to [place] (paperwork from [employer] on Glender Augusta's desk)

## 2016-12-01 NOTE — Telephone Encounter (Signed)
Pt will be dropping off a form up front from her insurance today to be completed so she can be reimbursed for prolia copay  Told her to drop off upfront and we will call when ready.

## 2017-01-13 NOTE — Telephone Encounter (Signed)
Pt called wanting to know what the status is on her reimbursement for her copay she paid

## 2017-01-13 NOTE — Telephone Encounter (Signed)
Tried to call patient x2, line is busy---I have called [employer] and they are saying they never recd first faxed papers from [place] about one month ago---I have refaxed paperwork to them again---will prob take about 2-4 weeks for patient to get reimbursement--can talk with Merdith Boyd if any further questions

## 2017-01-15 ENCOUNTER — Telehealth: Payer: Self-pay | Admitting: Internal Medicine

## 2017-01-15 NOTE — Telephone Encounter (Signed)
Pt called in and has some question about her prolia that she had.  She said that she paid her copay but has some question about that and the refund she should get though her ins?

## 2017-01-16 NOTE — Telephone Encounter (Signed)
Advised patient of what happened (from tele note on 6/2)

## 2017-01-30 DIAGNOSIS — Z961 Presence of intraocular lens: Secondary | ICD-10-CM | POA: Diagnosis not present

## 2017-01-30 DIAGNOSIS — H02834 Dermatochalasis of left upper eyelid: Secondary | ICD-10-CM | POA: Diagnosis not present

## 2017-01-30 DIAGNOSIS — H52203 Unspecified astigmatism, bilateral: Secondary | ICD-10-CM | POA: Diagnosis not present

## 2017-01-30 DIAGNOSIS — H02831 Dermatochalasis of right upper eyelid: Secondary | ICD-10-CM | POA: Diagnosis not present

## 2017-02-03 ENCOUNTER — Telehealth: Payer: Self-pay

## 2017-02-03 NOTE — Telephone Encounter (Signed)
Patient has been advised to bring copy of credit card statement to [facility], then papers can be refaxed to [employer] for patient reimbursement---see other phone note

## 2017-02-03 NOTE — Telephone Encounter (Signed)
Talked with patient and also talked with [employer]---they did receive my fax, but we are missing the credit card statement showing payment came out of patient's credit card--patient has been advised and will bring copy of her statement to [facility]issong----I will refax all paperwork to [employer] for reimbursement, patient can talk with Savonna Birchmeier if any further questions

## 2017-02-03 NOTE — Telephone Encounter (Signed)
Pt stated healthcare never received the paperwork for the refund,   Please refax to [phone]

## 2017-02-19 DIAGNOSIS — L918 Other hypertrophic disorders of the skin: Secondary | ICD-10-CM | POA: Diagnosis not present

## 2017-02-19 DIAGNOSIS — D3610 Benign neoplasm of peripheral nerves and autonomic nervous system, unspecified: Secondary | ICD-10-CM | POA: Diagnosis not present

## 2017-02-19 DIAGNOSIS — L728 Other follicular cysts of the skin and subcutaneous tissue: Secondary | ICD-10-CM | POA: Diagnosis not present

## 2017-02-19 DIAGNOSIS — L821 Other seborrheic keratosis: Secondary | ICD-10-CM | POA: Diagnosis not present

## 2017-02-19 DIAGNOSIS — L739 Follicular disorder, unspecified: Secondary | ICD-10-CM | POA: Diagnosis not present

## 2017-02-19 DIAGNOSIS — D1801 Hemangioma of skin and subcutaneous tissue: Secondary | ICD-10-CM | POA: Diagnosis not present

## 2017-02-19 DIAGNOSIS — D225 Melanocytic nevi of trunk: Secondary | ICD-10-CM | POA: Diagnosis not present

## 2017-03-17 NOTE — Telephone Encounter (Signed)
[employer] has contacted patient and is now requesting signature on all paperwork, along with current date and then needs to be refaxed to [employer] again---see tamara when patient comes in on 8/22 (nurse visit)

## 2017-03-18 ENCOUNTER — Ambulatory Visit (INDEPENDENT_AMBULATORY_CARE_PROVIDER_SITE_OTHER): Payer: PPO

## 2017-03-18 DIAGNOSIS — M810 Age-related osteoporosis without current pathological fracture: Secondary | ICD-10-CM

## 2017-03-18 MED ORDER — DENOSUMAB 60 MG/ML ~~LOC~~ SOLN
60.0000 mg | Freq: Once | SUBCUTANEOUS | Status: AC
  Administered 2017-03-18: 60 mg via SUBCUTANEOUS

## 2017-03-18 NOTE — Progress Notes (Signed)
prolia Injection given.   [name] Emanuele J Aero Drummonds, MD  

## 2017-03-31 DIAGNOSIS — Z6825 Body mass index (BMI) 25.0-25.9, adult: Secondary | ICD-10-CM | POA: Diagnosis not present

## 2017-03-31 DIAGNOSIS — Z01419 Encounter for gynecological examination (general) (routine) without abnormal findings: Secondary | ICD-10-CM | POA: Diagnosis not present

## 2017-04-23 NOTE — Telephone Encounter (Signed)
Pt called letting us know she received the reimbursement

## 2017-04-30 NOTE — Progress Notes (Signed)
Subjective:    Patient ID: [name] [name], female    DOB: 09/15/1940, 76 y.o.   MRN: [id number]  HPI She is here for a physical exam.   She did not take her BP medication yet today and usually takes it by now.  She checks her BP at home and it is usually well controlled.   She denies any changes in her health since she was here last and has no major concerns.  Medications and allergies reviewed with patient and updated if appropriate.  Patient Active Problem List   Diagnosis Date Noted  . Hypothyroidism 11/01/2016  . Thyroid antibody positive 05/04/2016  . Right groin pain 04/30/2016  . Change in bowel habits 04/30/2016  . Prediabetes 11/01/2015  . Spinal stenosis of lumbar region 10/03/2015  . DDD (degenerative disc disease), lumbar   . DJD (degenerative joint disease) of knee   . Hypertension 11/25/2010  . History of colon polyps 05/14/2009  . OBESITY 05/14/2009  . ARTHRALGIA UNSPECIFIED SITE 02/06/2009  . G E R D 08/17/2007  . Hyperlipidemia 08/16/2007  . HIATAL HERNIA 08/16/2007  . DIVERTICULAR DISEASE 08/16/2007  . IBS 08/16/2007  . Osteoporosis 08/16/2007    Current Outpatient Prescriptions on File Prior to Visit  Medication Sig Dispense Refill  . acetaminophen (TYLENOL) 325 MG tablet Take 650 mg by mouth every 4 (four) hours as needed.    [name] acetic acid-hydrocortisone (VOSOL-HC) otic solution Place 3 drops into both ears 2 (two) times daily as needed. 30 mL 1  . aspirin 81 MG EC tablet Take 81 mg by mouth daily. Ecotrin    . Biotin 5 MG CAPS Taking daily  0  . bisoprolol-hydrochlorothiazide (ZIAC) 5-6.25 MG tablet Take 1 tablet by mouth daily. 90 tablet 3  . calcium-vitamin D (OSCAL WITH D) 500-200 MG-UNIT per tablet Take 1 tablet by mouth 2 (two) times daily.     . clobetasol ointment (TEMOVATE) 0.25 % Apply 1 application topically 2 (two) times daily.    [name] denosumab (PROLIA) 60 MG/ML SOLN injection Inject 60 mg into the skin every 6 (six) months. Administer in  upper arm, thigh, or abdomen    . ibuprofen (ADVIL,MOTRIN) 200 MG tablet Take 600 mg by mouth 3 (three) times daily. With meals    . Levothyroxine Sodium 25 MCG CAPS Take 1 capsule (25 mcg total) by mouth daily before breakfast. 90 capsule 1  . Multiple Vitamins-Minerals (CENTRUM SILVER PO) Take by mouth daily.      [name] nystatin cream (MYCOSTATIN) Apply 1 application topically 2 (two) times daily as needed.    [name] omeprazole (PRILOSEC) 20 MG capsule Take 1 capsule (20 mg total) by mouth daily. 30 capsule 3  . phenylephrine-shark liver oil-mineral oil-petrolatum (PREPARATION H) 0.25-3-14-71.9 % rectal ointment Place 1 application rectally 2 (two) times daily as needed for hemorrhoids.    . pravastatin (PRAVACHOL) 10 MG tablet Take one tablet three times a week 36 tablet 3  . ranitidine (ZANTAC) 150 MG tablet Take 1 tablet (150 mg total) by mouth 2 (two) times daily. 180 tablet 3   No current facility-administered medications on file prior to visit.     Past Medical History:  Diagnosis Date  . Benign neoplasm of colon   . Cataract    bilateral,had surgery  . DDD (degenerative disc disease), lumbar    MRI w. [facility] 05/2015, s/p ESI x 1 w/ improvement  . Diaphragmatic hernia without mention of obstruction or gangrene   . Diverticulosis  of colon (without mention of hemorrhage)   . DJD (degenerative joint disease) of knee    L>R, IA cortisone by [facility] in L 03/2015  . Esophageal reflux   . Gallstones   . History of chicken pox   . Hyperlipidemia   . Hypertension   . Irritable bowel syndrome   . Migraines   . Obesity, unspecified   . Osteoporosis, unspecified   . Tachycardia   . Unspecified hemorrhoids with other complication     Past Surgical History:  Procedure Laterality Date  . APPENDECTOMY  1979  . CHOLECYSTECTOMY  1989  . COLONOSCOPY    . POLYPECTOMY    . VAGINAL HYSTERECTOMY  1996    Social History   Social History  . Marital status: Married    Spouse name: N/A  .  Number of children: N/A  . Years of education: N/A   Social History Main Topics  . Smoking status: Never Smoker  . Smokeless tobacco: Never Used  . Alcohol use No  . Drug use: No  . Sexual activity: Not Asked   Other Topics Concern  . None   Social History Narrative   Exercise: no regular exercise    Family History  Problem Relation Age of Onset  . Heart disease Sister        x 2  . Heart disease Brother   . Stroke Mother   . Colon cancer Sister   . Stroke Sister 87    Review of Systems  Constitutional: Positive for appetite change (increased). Negative for chills, fatigue, fever and unexpected weight change.  Eyes: Negative for visual disturbance.  Respiratory: Negative for cough, shortness of breath and wheezing.   Cardiovascular: Negative for chest pain, palpitations and leg swelling.  Gastrointestinal: Positive for anal bleeding (occ hemorrhoidal bleeding). Negative for abdominal pain, blood in stool, constipation, diarrhea and nausea.       GERD rare  Genitourinary: Positive for dysuria (rare). Negative for hematuria.       Nocturia 1-2  Musculoskeletal: Negative for arthralgias and back pain.  Skin: Negative for color change and rash.  Neurological: Negative for light-headedness and headaches.  Psychiatric/Behavioral: Negative for dysphoric mood. The patient is not nervous/anxious.        Objective:   Vitals:   05/01/17 0859 05/01/17 0954  BP: (!) 174/80 (!) 154/88  Pulse: 70   Resp: 16   Temp: 98.7 F (37.1 C)   SpO2: 97%    Filed Weights   05/01/17 0859  Weight: 132 lb (59.9 kg)   Body mass index is 22.66 kg/m.  Wt Readings from Last 3 Encounters:  05/01/17 132 lb (59.9 kg)  11/26/16 132 lb (59.9 kg)  10/30/16 129 lb (58.5 kg)     Physical Exam Constitutional: She appears well-developed and well-nourished. No distress.  HENT:  Head: Normocephalic and atraumatic.  Right Ear: External ear normal. Normal ear canal and TM Left Ear: External  ear normal.  Normal ear canal and TM Mouth/Throat: Oropharynx is clear and moist.  Eyes: Conjunctivae and EOM are normal.  Neck: Neck supple. No tracheal deviation present. No thyromegaly present.  No carotid bruit  Cardiovascular: Normal rate, regular rhythm and normal heart sounds.   No murmur heard.  No edema. Pulmonary/Chest: Effort normal and breath sounds normal. No respiratory distress. She has no wheezes. She has no rales.  Breast: deferred  Abdominal: Soft. She exhibits no distension. There is no tenderness.  Lymphadenopathy: She has no cervical adenopathy.  Skin:  Skin is warm and dry. She is not diaphoretic.  Psychiatric: She has a normal mood and affect. Her behavior is normal.        Assessment & Plan:   Physical exam: Screening blood work   ordered Immunizations  Flu vaccine today, discussed shingrix Colonoscopy  Up to date     Mammogram   Up to date  Gyn - up to date - Dr Matthew Saras Dexa  Last done 2016, on prolia Eye exams     Up to date  EKG      Last done 2014 Exercise     No regular exercise, active; Stressed the importance of regular exercise such as walking Weight  Normal BMI Skin     No concerns Substance abuse     none  See Problem List for Assessment and Plan of chronic medical problems.  Follow-up in 6 months

## 2017-04-30 NOTE — Assessment & Plan Note (Signed)
Check tsh  Titrate med dose if needed  

## 2017-04-30 NOTE — Assessment & Plan Note (Addendum)
Last prolia 03/18/17 ( #4) Last dexa 2016 Taking calcium and vitamin d Not currently exercising-stressed the importance of regular exercise-encouraged her to restart walking

## 2017-04-30 NOTE — Patient Instructions (Addendum)
Your blood pressure should be less than 140/90 most of the time.   Test(s) ordered today. Your results will be released to [facility] (or called to you) after review, usually within 72hours after test completion. If any changes need to be made, you will be notified at that same time.  All other Health Maintenance issues reviewed.   All recommended immunizations and age-appropriate screenings are up-to-date or discussed.  Flu immunization administered today.   Medications reviewed and updated.  No changes recommended at this time.    Please followup in 6 months   Health Maintenance, Female Adopting a healthy lifestyle and getting preventive care can go a long way to promote health and wellness. Talk with your health care provider about what schedule of regular examinations is right for you. This is a good chance for you to check in with your provider about disease prevention and staying healthy. In between checkups, there are plenty of things you can do on your own. Experts have done a lot of research about which lifestyle changes and preventive measures are most likely to keep you healthy. Ask your health care provider for more information. Weight and diet Eat a healthy diet  Be sure to include plenty of vegetables, fruits, low-fat dairy products, and lean protein.  Do not eat a lot of foods high in solid fats, added sugars, or salt.  Get regular exercise. This is one of the most important things you can do for your health. ? Most adults should exercise for at least 150 minutes each week. The exercise should increase your heart rate and make you sweat (moderate-intensity exercise). ? Most adults should also do strengthening exercises at least twice a week. This is in addition to the moderate-intensity exercise.  Maintain a healthy weight  Body mass index (BMI) is a measurement that can be used to identify possible weight problems. It estimates body fat based on height and weight. Your  health care provider can help determine your BMI and help you achieve or maintain a healthy weight.  For females 44 years of age and older: ? A BMI below 18.5 is considered underweight. ? A BMI of 18.5 to 24.9 is normal. ? A BMI of 25 to 29.9 is considered overweight. ? A BMI of 30 and above is considered obese.  Watch levels of cholesterol and blood lipids  You should start having your blood tested for lipids and cholesterol at 78 years of age, then have this test every 5 years.  You may need to have your cholesterol levels checked more often if: ? Your lipid or cholesterol levels are high. ? You are older than 78 years of age. ? You are at high risk for heart disease.  Cancer screening Lung Cancer  Lung cancer screening is recommended for adults 28-54 years old who are at high risk for lung cancer because of a history of smoking.  A yearly low-dose CT scan of the lungs is recommended for people who: ? Currently smoke. ? Have quit within the past 15 years. ? Have at least a 30-pack-year history of smoking. A pack year is smoking an average of one pack of cigarettes a day for 1 year.  Yearly screening should continue until it has been 15 years since you quit.  Yearly screening should stop if you develop a health problem that would prevent you from having lung cancer treatment.  Breast Cancer  Practice breast self-awareness. This means understanding how your breasts normally appear and feel.  It  also means doing regular breast self-exams. Let your health care provider know about any changes, no matter how small.  If you are in your 20s or 30s, you should have a clinical breast exam (CBE) by a health care provider every 1-3 years as part of a regular health exam.  If you are 82 or older, have a CBE every year. Also consider having a breast X-ray (mammogram) every year.  If you have a family history of breast cancer, talk to your health care provider about genetic  screening.  If you are at high risk for breast cancer, talk to your health care provider about having an MRI and a mammogram every year.  Breast cancer gene (BRCA) assessment is recommended for women who have family members with BRCA-related cancers. BRCA-related cancers include: ? Breast. ? Ovarian. ? Tubal. ? Peritoneal cancers.  Results of the assessment will determine the need for genetic counseling and BRCA1 and BRCA2 testing.  Cervical Cancer Your health care provider may recommend that you be screened regularly for cancer of the pelvic organs (ovaries, uterus, and vagina). This screening involves a pelvic examination, including checking for microscopic changes to the surface of your cervix (Pap test). You may be encouraged to have this screening done every 3 years, beginning at age 18.  For women ages 28-65, health care providers may recommend pelvic exams and Pap testing every 3 years, or they may recommend the Pap and pelvic exam, combined with testing for human papilloma virus (HPV), every 5 years. Some types of HPV increase your risk of cervical cancer. Testing for HPV may also be done on women of any age with unclear Pap test results.  Other health care providers may not recommend any screening for nonpregnant women who are considered low risk for pelvic cancer and who do not have symptoms. Ask your health care provider if a screening pelvic exam is right for you.  If you have had past treatment for cervical cancer or a condition that could lead to cancer, you need Pap tests and screening for cancer for at least 20 years after your treatment. If Pap tests have been discontinued, your risk factors (such as having a new sexual partner) need to be reassessed to determine if screening should resume. Some women have medical problems that increase the chance of getting cervical cancer. In these cases, your health care provider may recommend more frequent screening and Pap  tests.  Colorectal Cancer  This type of cancer can be detected and often prevented.  Routine colorectal cancer screening usually begins at 78 years of age and continues through 78 years of age.  Your health care provider may recommend screening at an earlier age if you have risk factors for colon cancer.  Your health care provider may also recommend using home test kits to check for hidden blood in the stool.  A small camera at the end of a tube can be used to examine your colon directly (sigmoidoscopy or colonoscopy). This is done to check for the earliest forms of colorectal cancer.  Routine screening usually begins at age 11.  Direct examination of the colon should be repeated every 5-10 years through 78 years of age. However, you may need to be screened more often if early forms of precancerous polyps or small growths are found.  Skin Cancer  Check your skin from head to toe regularly.  Tell your health care provider about any new moles or changes in moles, especially if there is a change  in a mole's shape or color.  Also tell your health care provider if you have a mole that is larger than the size of a pencil eraser.  Always use sunscreen. Apply sunscreen liberally and repeatedly throughout the day.  Protect yourself by wearing long sleeves, pants, a wide-brimmed hat, and sunglasses whenever you are outside.  Heart disease, diabetes, and high blood pressure  High blood pressure causes heart disease and increases the risk of stroke. High blood pressure is more likely to develop in: ? People who have blood pressure in the high end of the normal range (130-139/85-89 mm Hg). ? People who are overweight or obese. ? People who are African American.  If you are 85-94 years of age, have your blood pressure checked every 3-5 years. If you are 12 years of age or older, have your blood pressure checked every year. You should have your blood pressure measured twice-once when you are at  a hospital or clinic, and once when you are not at a hospital or clinic. Record the average of the two measurements. To check your blood pressure when you are not at a hospital or clinic, you can use: ? An automated blood pressure machine at a pharmacy. ? A home blood pressure monitor.  If you are between 31 years and 41 years old, ask your health care provider if you should take aspirin to prevent strokes.  Have regular diabetes screenings. This involves taking a blood sample to check your fasting blood sugar level. ? If you are at a normal weight and have a low risk for diabetes, have this test once every three years after 78 years of age. ? If you are overweight and have a high risk for diabetes, consider being tested at a younger age or more often. Preventing infection Hepatitis B  If you have a higher risk for hepatitis B, you should be screened for this virus. You are considered at high risk for hepatitis B if: ? You were born in a country where hepatitis B is common. Ask your health care provider which countries are considered high risk. ? Your parents were born in a high-risk country, and you have not been immunized against hepatitis B (hepatitis B vaccine). ? You have HIV or AIDS. ? You use needles to inject street drugs. ? You live with someone who has hepatitis B. ? You have had sex with someone who has hepatitis B. ? You get hemodialysis treatment. ? You take certain medicines for conditions, including cancer, organ transplantation, and autoimmune conditions.  Hepatitis C  Blood testing is recommended for: ? Everyone born from [birth year] through 1967. ? Anyone with known risk factors for hepatitis C.  Sexually transmitted infections (STIs)  You should be screened for sexually transmitted infections (STIs) including gonorrhea and chlamydia if: ? You are sexually active and are younger than 78 years of age. ? You are older than 78 years of age and your health care provider tells  you that you are at risk for this type of infection. ? Your sexual activity has changed since you were last screened and you are at an increased risk for chlamydia or gonorrhea. Ask your health care provider if you are at risk.  If you do not have HIV, but are at risk, it may be recommended that you take a prescription medicine daily to prevent HIV infection. This is called pre-exposure prophylaxis (PrEP). You are considered at risk if: ? You are sexually active and do not regularly use  condoms or know the HIV status of your partner(s). ? You take drugs by injection. ? You are sexually active with a partner who has HIV.  Talk with your health care provider about whether you are at high risk of being infected with HIV. If you choose to begin PrEP, you should first be tested for HIV. You should then be tested every 3 months for as long as you are taking PrEP. Pregnancy  If you are premenopausal and you may become pregnant, ask your health care provider about preconception counseling.  If you may become pregnant, take 400 to 800 micrograms (mcg) of folic acid every day.  If you want to prevent pregnancy, talk to your health care provider about birth control (contraception). Osteoporosis and menopause  Osteoporosis is a disease in which the bones lose minerals and strength with aging. This can result in serious bone fractures. Your risk for osteoporosis can be identified using a bone density scan.  If you are 7 years of age or older, or if you are at risk for osteoporosis and fractures, ask your health care provider if you should be screened.  Ask your health care provider whether you should take a calcium or vitamin D supplement to lower your risk for osteoporosis.  Menopause may have certain physical symptoms and risks.  Hormone replacement therapy may reduce some of these symptoms and risks. Talk to your health care provider about whether hormone replacement therapy is right for  you. Follow these instructions at home:  Schedule regular health, dental, and eye exams.  Stay current with your immunizations.  Do not use any tobacco products including cigarettes, chewing tobacco, or electronic cigarettes.  If you are pregnant, do not drink alcohol.  If you are breastfeeding, limit how much and how often you drink alcohol.  Limit alcohol intake to no more than 1 drink per day for nonpregnant women. One drink equals 12 ounces of beer, 5 ounces of wine, or 1 ounces of hard liquor.  Do not use street drugs.  Do not share needles.  Ask your health care provider for help if you need support or information about quitting drugs.  Tell your health care provider if you often feel depressed.  Tell your health care provider if you have ever been abused or do not feel safe at home. This information is not intended to replace advice given to you by your health care provider. Make sure you discuss any questions you have with your health care provider. Document Released: 01/27/2011 Document Revised: 12/20/2015 Document Reviewed: 04/17/2015 Elsevier Interactive Patient Education  [employer].

## 2017-05-01 ENCOUNTER — Ambulatory Visit (INDEPENDENT_AMBULATORY_CARE_PROVIDER_SITE_OTHER): Payer: PPO | Admitting: Internal Medicine

## 2017-05-01 ENCOUNTER — Encounter: Payer: Self-pay | Admitting: Internal Medicine

## 2017-05-01 ENCOUNTER — Other Ambulatory Visit (INDEPENDENT_AMBULATORY_CARE_PROVIDER_SITE_OTHER): Payer: PPO

## 2017-05-01 VITALS — BP 154/88 | HR 70 | Temp 98.7°F | Resp 16 | Ht 64.0 in | Wt 132.0 lb

## 2017-05-01 DIAGNOSIS — E038 Other specified hypothyroidism: Secondary | ICD-10-CM

## 2017-05-01 DIAGNOSIS — Z0000 Encounter for general adult medical examination without abnormal findings: Secondary | ICD-10-CM | POA: Diagnosis not present

## 2017-05-01 DIAGNOSIS — M810 Age-related osteoporosis without current pathological fracture: Secondary | ICD-10-CM | POA: Diagnosis not present

## 2017-05-01 DIAGNOSIS — E7849 Other hyperlipidemia: Secondary | ICD-10-CM

## 2017-05-01 DIAGNOSIS — I10 Essential (primary) hypertension: Secondary | ICD-10-CM

## 2017-05-01 DIAGNOSIS — K219 Gastro-esophageal reflux disease without esophagitis: Secondary | ICD-10-CM | POA: Diagnosis not present

## 2017-05-01 DIAGNOSIS — E063 Autoimmune thyroiditis: Secondary | ICD-10-CM

## 2017-05-01 DIAGNOSIS — R7303 Prediabetes: Secondary | ICD-10-CM

## 2017-05-01 DIAGNOSIS — Z23 Encounter for immunization: Secondary | ICD-10-CM | POA: Diagnosis not present

## 2017-05-01 LAB — COMPREHENSIVE METABOLIC PANEL
ALT: 11 U/L (ref 0–35)
AST: 15 U/L (ref 0–37)
Albumin: 4.4 g/dL (ref 3.5–5.2)
Alkaline Phosphatase: 36 U/L — ABNORMAL LOW (ref 39–117)
BILIRUBIN TOTAL: 1.5 mg/dL — AB (ref 0.2–1.2)
BUN: 15 mg/dL (ref 6–23)
CO2: 32 meq/L (ref 19–32)
Calcium: 9.9 mg/dL (ref 8.4–10.5)
Chloride: 101 mEq/L (ref 96–112)
Creatinine, Ser: 0.69 mg/dL (ref 0.40–1.20)
GFR: 87.44 mL/min (ref >60.00)
GLUCOSE: 109 mg/dL — AB (ref 70–99)
Potassium: 4.1 mEq/L (ref 3.5–5.1)
Sodium: 140 mEq/L (ref 135–145)
Total Protein: 7.7 g/dL (ref 6.0–8.3)

## 2017-05-01 LAB — LIPID PANEL
CHOL/HDL RATIO: 4
Cholesterol: 229 mg/dL — ABNORMAL HIGH (ref 0–200)
HDL: 58.3 mg/dL (ref >39.00)
LDL Cholesterol: 142 mg/dL — ABNORMAL HIGH (ref 0–99)
NONHDL: 170.42
Triglycerides: 143 mg/dL (ref 0.0–149.0)
VLDL: 28.6 mg/dL (ref 0.0–40.0)

## 2017-05-01 LAB — CBC WITH DIFFERENTIAL/PLATELET
BASOS ABS: 0.1 10*3/uL (ref 0.0–0.1)
Basophils Relative: 0.9 % (ref 0.0–3.0)
EOS ABS: 0.2 10*3/uL (ref 0.0–0.7)
Eosinophils Relative: 2.5 % (ref 0.0–5.0)
HCT: 42.7 % (ref 36.0–46.0)
Hemoglobin: 14.4 g/dL (ref 12.0–15.0)
LYMPHS ABS: 2.3 10*3/uL (ref 0.7–4.0)
Lymphocytes Relative: 33.6 % (ref 12.0–46.0)
MCHC: 33.8 g/dL (ref 30.0–36.0)
MCV: 88.9 fl (ref 78.0–100.0)
Monocytes Absolute: 0.6 10*3/uL (ref 0.1–1.0)
Monocytes Relative: 8.9 % (ref 3.0–12.0)
NEUTROS ABS: 3.7 10*3/uL (ref 1.4–7.7)
NEUTROS PCT: 54.1 % (ref 43.0–77.0)
PLATELETS: 234 10*3/uL (ref 150.0–400.0)
RBC: 4.8 Mil/uL (ref 3.87–5.11)
RDW: 13.3 % (ref 11.5–15.5)
WBC: 6.8 10*3/uL (ref 4.0–10.5)

## 2017-05-01 LAB — HEMOGLOBIN A1C: Hgb A1c MFr Bld: 5.6 % (ref 4.6–6.5)

## 2017-05-01 LAB — TSH: TSH: 2.86 u[IU]/mL (ref 0.35–4.50)

## 2017-05-01 MED ORDER — BENEFIBER PO POWD: ORAL | 0 refills | Status: AC

## 2017-05-01 NOTE — Assessment & Plan Note (Signed)
Controlled with current medication Continue Zantac twice daily

## 2017-05-01 NOTE — Assessment & Plan Note (Signed)
Check a1c Low sugar / carb diet Stressed regular exercise   

## 2017-05-01 NOTE — Assessment & Plan Note (Signed)
Check lipid panel  Continue daily statin Regular exercise and healthy diet encouraged  

## 2017-05-01 NOTE — Assessment & Plan Note (Signed)
Blood pressure slightly elevated here today She will monitor it at home closely Continue current medications Stressed starting regular exercise CMP

## 2017-05-04 ENCOUNTER — Other Ambulatory Visit: Payer: Self-pay | Admitting: Emergency Medicine

## 2017-05-04 MED ORDER — LEVOTHYROXINE SODIUM 25 MCG PO CAPS: 25.0000 ug | ORAL_CAPSULE | Freq: Every day | ORAL | 1 refills | Status: DC

## 2017-05-04 MED ORDER — PRAVASTATIN SODIUM 10 MG PO TABS: 10.0000 mg | ORAL_TABLET | Freq: Every day | ORAL | 1 refills | Status: DC

## 2017-06-19 ENCOUNTER — Telehealth: Payer: Self-pay

## 2017-06-19 DIAGNOSIS — J029 Acute pharyngitis, unspecified: Secondary | ICD-10-CM | POA: Diagnosis not present

## 2017-06-19 DIAGNOSIS — Z20818 Contact with and (suspected) exposure to other bacterial communicable diseases: Secondary | ICD-10-CM | POA: Diagnosis not present

## 2017-06-19 DIAGNOSIS — I10 Essential (primary) hypertension: Secondary | ICD-10-CM | POA: Diagnosis not present

## 2017-06-19 NOTE — Telephone Encounter (Signed)
Pt called after hours and states that throat hurts and it started fefore she went to bed last night.   Called pt and left detailed message offering an appointment for today or Saturday.

## 2017-06-22 ENCOUNTER — Telehealth: Payer: Self-pay | Admitting: Internal Medicine

## 2017-06-22 MED ORDER — OMEPRAZOLE 20 MG PO CPDR: 20.0000 mg | DELAYED_RELEASE_CAPSULE | Freq: Every day | ORAL | 8 refills | Status: DC

## 2017-06-22 NOTE — Telephone Encounter (Signed)
Patient Name: [name] [name]  DOB: September 18, 1940    Initial Comment Caller has a sore throat and thinks she might also have acid reflux and if she can get a refill on her Rx for Omeprazole.    Nurse Assessment  Nurse: Matthew Folks, RN, H[place] Date/Time (Eastern Time): 06/22/2017 4:27:05 PM  Confirm and document reason for call. If symptomatic, describe symptoms. ---Caller states she has a sore throat. On Friday she went to an after hours clinic, taking Amoxicillan since Friday. Pt thinks maybe this sore throat is due to her reflux and not a virus. She took omeprazole in the past but has been on Zantac in the meantime. She states Zantac does not work. Pt doctor is Dr. Quay Burow. She tried to call office earlier and then sent her to [place]. Denies triage.  Does the patient have any new or worsening symptoms? ---No     Guidelines    Guideline Title Affirmed Question Affirmed Notes       Final Disposition User   Clinical Call Shook-Minyard, RN, Hannelore    Comments  Told pt this report will go to office so they can see it, but suggested she call in the am because the doctor may want to see or talk to her about this before represcribing omeprazole for her. The oncall docs are not her doc and will not know her. She will call in am.

## 2017-06-22 NOTE — Telephone Encounter (Signed)
Omeprazole sent to [facility] - have her follow up with me if there is no improvement.

## 2017-06-23 NOTE — Telephone Encounter (Signed)
Spoke with pt to inform.  

## 2017-09-18 ENCOUNTER — Other Ambulatory Visit: Payer: Self-pay | Admitting: Obstetrics and Gynecology

## 2017-09-18 ENCOUNTER — Telehealth: Payer: Self-pay | Admitting: Internal Medicine

## 2017-09-18 DIAGNOSIS — Z1231 Encounter for screening mammogram for malignant neoplasm of breast: Secondary | ICD-10-CM

## 2017-09-18 NOTE — Telephone Encounter (Signed)
Patient should have been reimbursed thru [employer] which pays for copay of prolia for patients who qualify for patient assistance funding--- Advised patient that documents have already been faxed twice previously---however, I am refaxing today---patient is going to call to check to see if fax was received, and will call me back if they did not get fax with a verifying fax number to send info to

## 2017-09-18 NOTE — Telephone Encounter (Signed)
Copied from [place] [id number]. Topic: Quick Communication - See Telephone Encounter >> Sep 18, 2017 11:12 AM Robina Ade, Helene Kelp D wrote: CRM for notification. See Telephone encounter for: 09/18/17. Patient called and would like to talk to Dr. Quay Burow CMA about the copay for her prolia injection and paperwork. Please call patient back.

## 2017-09-30 ENCOUNTER — Telehealth: Payer: Self-pay | Admitting: Internal Medicine

## 2017-09-30 NOTE — Telephone Encounter (Signed)
Copied from [place] [id number]. Topic: General - Other >> Sep 30, 2017  9:47 AM Darl Householder, RMA wrote: Reason for CRM: Patient is requesting a call back concerning paperwork for prolia injections

## 2017-10-01 NOTE — Telephone Encounter (Signed)
I have gone thru papers I have with patient on phone, looks like we are missing august 2018 receipts for payment of august 2018 injection---patient to bring copies to my office at [facility]---can see Aquinnah Devin,RN

## 2017-10-07 ENCOUNTER — Ambulatory Visit
Admission: RE | Admit: 2017-10-07 | Discharge: 2017-10-07 | Disposition: A | Discharge status: Home / Self Care | Payer: PPO | Source: Ambulatory Visit | Attending: Obstetrics and Gynecology | Admitting: Obstetrics and Gynecology

## 2017-10-07 DIAGNOSIS — Z1231 Encounter for screening mammogram for malignant neoplasm of breast: Secondary | ICD-10-CM | POA: Diagnosis not present

## 2017-10-09 ENCOUNTER — Telehealth: Payer: Self-pay | Admitting: Internal Medicine

## 2017-10-09 NOTE — Telephone Encounter (Signed)
Copied from [place] [id number]. Topic: Quick Communication - See Telephone Encounter >> Oct 09, 2017  4:28 PM Cleaster Corin, NT wrote: CRM for notification. See Telephone encounter for:   10/09/17. Pt. Calling to speak with tamara about prolia injection [phone]

## 2017-10-12 NOTE — Telephone Encounter (Signed)
Patient was checking to make sure I received copies of paperwork showing she had paid for injection (that she left previously at front desk)---I did receive them and have faxed over all papers to [facility] for reimbursement

## 2017-10-30 ENCOUNTER — Other Ambulatory Visit: Payer: Self-pay | Admitting: Internal Medicine

## 2017-11-01 NOTE — Patient Instructions (Addendum)

## 2017-11-01 NOTE — Progress Notes (Signed)
Subjective:    Patient ID: [name] [name], female    DOB: 01/26/1941, 77 y.o.   MRN: [id number]  HPI The patient is here for follow up.  Prediabetes:  She is compliant with a low sugar/carbohydrate diet.  She is not exercising regularly.  Hypertension: She is taking her medication daily. She is compliant with a low sodium diet.  She denies chest pain, palpitations, edema, shortness of breath and regular headaches. She is not exercising regularly.  She does monitor her blood pressure at home - it has been well controlled.    Hyperlipidemia: She is taking her medication daily. She is compliant with a low fat/cholesterol diet. She is not exercising regularly. She denies myalgias.   Hypothyroidism:  She is taking her medication daily.  She denies any recent changes in energy or weight that are unexplained.   GERD:  She is taking her medication daily as prescribed.  She denies any GERD symptoms and feels her GERD is well controlled.   Osteoporosis:  She is getting prolia injections every 6 months - last one was 02/2017.  She is trying to get patient copay assistance.    Knee pain, Back pain:   The knee pain is worse pain is when she lays down at night, then after sitting too long. It is hard to sleep at night sometimes - at night she is ok if she lays on her back she is ok.   She was unsure if the nighttime knee pain was related to her knees or back.  She denies back pain.  She has had injections in both her knees and back.  She takes tylenol as needed.     Medications and allergies reviewed with patient and updated if appropriate.  Patient Active Problem List   Diagnosis Date Noted  . Hypothyroidism 11/01/2016  . Right groin pain 04/30/2016  . Change in bowel habits 04/30/2016  . Prediabetes 11/01/2015  . Spinal stenosis of lumbar region 10/03/2015  . DDD (degenerative disc disease), lumbar   . DJD (degenerative joint disease) of knee   . Hypertension 11/25/2010  . History of colon  polyps 05/14/2009  . OBESITY 05/14/2009  . G E R D 08/17/2007  . Hyperlipidemia 08/16/2007  . HIATAL HERNIA 08/16/2007  . DIVERTICULAR DISEASE 08/16/2007  . IBS 08/16/2007  . Osteoporosis 08/16/2007    Current Outpatient Medications on File Prior to Visit  Medication Sig Dispense Refill  . acetic acid-hydrocortisone (VOSOL-HC) otic solution Place 3 drops into both ears 2 (two) times daily as needed. 30 mL 1  . aspirin 81 MG EC tablet Take 81 mg by mouth daily. Ecotrin    . Biotin 5 MG CAPS Taking daily  0  . bisoprolol-hydrochlorothiazide (ZIAC) 5-6.25 MG tablet Take 1 tablet by mouth daily. 90 tablet 3  . calcium-vitamin D (OSCAL WITH D) 500-200 MG-UNIT per tablet Take 1 tablet by mouth 2 (two) times daily.     . clobetasol ointment (TEMOVATE) 2.87 % Apply 1 application topically 2 (two) times daily.    [name] denosumab (PROLIA) 60 MG/ML SOLN injection Inject 60 mg into the skin every 6 (six) months. Administer in upper arm, thigh, or abdomen    . ibuprofen (ADVIL,MOTRIN) 200 MG tablet Take 600 mg by mouth 3 (three) times daily. With meals    . Multiple Vitamins-Minerals (CENTRUM SILVER PO) Take by mouth daily.      [name] nystatin cream (MYCOSTATIN) Apply 1 application topically 2 (two) times daily as needed.    [name]  phenylephrine-shark liver oil-mineral oil-petrolatum (PREPARATION H) 0.25-3-14-71.9 % rectal ointment Place 1 application rectally 2 (two) times daily as needed for hemorrhoids.    . ranitidine (ZANTAC) 150 MG tablet Take 1 tablet (150 mg total) by mouth 2 (two) times daily. 180 tablet 3  . Wheat Dextrin (BENEFIBER) POWD Twice daily  0   No current facility-administered medications on file prior to visit.     Past Medical History:  Diagnosis Date  . Benign neoplasm of colon   . Cataract    bilateral,had surgery  . DDD (degenerative disc disease), lumbar    MRI w. [facility] 05/2015, s/p ESI x 1 w/ improvement  . Diaphragmatic hernia without mention of obstruction or gangrene   .  Diverticulosis of colon (without mention of hemorrhage)   . DJD (degenerative joint disease) of knee    L>R, IA cortisone by [facility] in L 03/2015  . Esophageal reflux   . Gallstones   . History of chicken pox   . Hyperlipidemia   . Hypertension   . Irritable bowel syndrome   . Migraines   . Obesity, unspecified   . Osteoporosis, unspecified   . Tachycardia   . Unspecified hemorrhoids with other complication     Past Surgical History:  Procedure Laterality Date  . APPENDECTOMY  1979  . CHOLECYSTECTOMY  1989  . COLONOSCOPY    . POLYPECTOMY    . VAGINAL HYSTERECTOMY  1996    Social History   Socioeconomic History  . Marital status: Married    Spouse name: Not on file  . Number of children: Not on file  . Years of education: Not on file  . Highest education level: Not on file  Occupational History  . Not on file  Social Needs  . Financial resource strain: Not on file  . Food insecurity:    Worry: Not on file    Inability: Not on file  . Transportation needs:    Medical: Not on file    Non-medical: Not on file  Tobacco Use  . Smoking status: Never Smoker  . Smokeless tobacco: Never Used  Substance and Sexual Activity  . Alcohol use: No    Alcohol/week: 0.0 oz  . Drug use: No  . Sexual activity: Not on file  Lifestyle  . Physical activity:    Days per week: Not on file    Minutes per session: Not on file  . Stress: Not on file  Relationships  . Social connections:    Talks on phone: Not on file    Gets together: Not on file    Attends religious service: Not on file    Active member of club or organization: Not on file    Attends meetings of clubs or organizations: Not on file    Relationship status: Not on file  Other Topics Concern  . Not on file  Social History Narrative   Exercise: no regular exercise    Family History  Problem Relation Age of Onset  . Heart disease Sister        x 2  . Heart disease Brother   . Stroke Mother   . Colon cancer  Sister   . Stroke Sister 14    Review of Systems  Constitutional: Negative for chills, fatigue and fever.  Respiratory: Negative for cough, shortness of breath and wheezing.   Cardiovascular: Negative for chest pain, palpitations and leg swelling.  Gastrointestinal: Negative for abdominal pain.  Genitourinary: Negative for pelvic pain.  Musculoskeletal: Positive for arthralgias.  Neurological: Negative for light-headedness and headaches.       Objective:   Vitals:   11/02/17 0856  BP: (!) 144/80  Pulse: 65  Resp: 16  Temp: 98.5 F (36.9 C)  SpO2: 94%   BP Readings from Last 3 Encounters:  11/02/17 (!) 144/80  05/01/17 (!) 154/88  11/26/16 133/65   Wt Readings from Last 3 Encounters:  11/02/17 134 lb (60.8 kg)  05/01/17 132 lb (59.9 kg)  11/26/16 132 lb (59.9 kg)   Body mass index is 23 kg/m.   Physical Exam    Constitutional: Appears well-developed and well-nourished. No distress.  HENT:  Head: Normocephalic and atraumatic.  Neck: Neck supple. No tracheal deviation present. No thyromegaly present.  No cervical lymphadenopathy Cardiovascular: Normal rate, regular rhythm and normal heart sounds.   No murmur heard. No carotid bruit .  No edema Pulmonary/Chest: Effort normal and breath sounds normal. No respiratory distress. No has no wheezes. No rales.  Skin: Skin is warm and dry. Not diaphoretic.  Psychiatric: Normal mood and affect. Behavior is normal.      Assessment & Plan:    See Problem List for Assessment and Plan of chronic medical problems.

## 2017-11-02 ENCOUNTER — Ambulatory Visit (INDEPENDENT_AMBULATORY_CARE_PROVIDER_SITE_OTHER): Payer: PPO | Admitting: Internal Medicine

## 2017-11-02 ENCOUNTER — Encounter: Payer: Self-pay | Admitting: Internal Medicine

## 2017-11-02 ENCOUNTER — Other Ambulatory Visit (INDEPENDENT_AMBULATORY_CARE_PROVIDER_SITE_OTHER): Payer: PPO

## 2017-11-02 VITALS — BP 144/80 | HR 65 | Temp 98.5°F | Resp 16 | Wt 134.0 lb

## 2017-11-02 DIAGNOSIS — E7849 Other hyperlipidemia: Secondary | ICD-10-CM

## 2017-11-02 DIAGNOSIS — R7303 Prediabetes: Secondary | ICD-10-CM

## 2017-11-02 DIAGNOSIS — I10 Essential (primary) hypertension: Secondary | ICD-10-CM

## 2017-11-02 DIAGNOSIS — E038 Other specified hypothyroidism: Secondary | ICD-10-CM | POA: Diagnosis not present

## 2017-11-02 DIAGNOSIS — E063 Autoimmune thyroiditis: Secondary | ICD-10-CM | POA: Diagnosis not present

## 2017-11-02 DIAGNOSIS — K219 Gastro-esophageal reflux disease without esophagitis: Secondary | ICD-10-CM | POA: Diagnosis not present

## 2017-11-02 DIAGNOSIS — M170 Bilateral primary osteoarthritis of knee: Secondary | ICD-10-CM | POA: Diagnosis not present

## 2017-11-02 DIAGNOSIS — M810 Age-related osteoporosis without current pathological fracture: Secondary | ICD-10-CM

## 2017-11-02 LAB — COMPREHENSIVE METABOLIC PANEL
ALT: 13 U/L (ref 0–35)
AST: 17 U/L (ref 0–37)
Albumin: 4.3 g/dL (ref 3.5–5.2)
Alkaline Phosphatase: 44 U/L (ref 39–117)
BUN: 13 mg/dL (ref 6–23)
CHLORIDE: 100 meq/L (ref 96–112)
CO2: 32 meq/L (ref 19–32)
Calcium: 10 mg/dL (ref 8.4–10.5)
Creatinine, Ser: 0.75 mg/dL (ref 0.40–1.20)
GFR: 79.31 mL/min (ref >60.00)
GLUCOSE: 103 mg/dL — AB (ref 70–99)
POTASSIUM: 3.9 meq/L (ref 3.5–5.1)
SODIUM: 140 meq/L (ref 135–145)
Total Bilirubin: 1.8 mg/dL — ABNORMAL HIGH (ref 0.2–1.2)
Total Protein: 7.7 g/dL (ref 6.0–8.3)

## 2017-11-02 LAB — CBC WITH DIFFERENTIAL/PLATELET
BASOS ABS: 0.1 10*3/uL (ref 0.0–0.1)
Basophils Relative: 0.9 % (ref 0.0–3.0)
EOS ABS: 0.2 10*3/uL (ref 0.0–0.7)
Eosinophils Relative: 2.4 % (ref 0.0–5.0)
HCT: 40.7 % (ref 36.0–46.0)
Hemoglobin: 13.7 g/dL (ref 12.0–15.0)
Lymphocytes Relative: 31.6 % (ref 12.0–46.0)
Lymphs Abs: 2 10*3/uL (ref 0.7–4.0)
MCHC: 33.7 g/dL (ref 30.0–36.0)
MCV: 88 fl (ref 78.0–100.0)
MONO ABS: 0.6 10*3/uL (ref 0.1–1.0)
MONOS PCT: 9.2 % (ref 3.0–12.0)
NEUTROS PCT: 55.9 % (ref 43.0–77.0)
Neutro Abs: 3.5 10*3/uL (ref 1.4–7.7)
Platelets: 233 10*3/uL (ref 150.0–400.0)
RBC: 4.62 Mil/uL (ref 3.87–5.11)
RDW: 13.3 % (ref 11.5–15.5)
WBC: 6.3 10*3/uL (ref 4.0–10.5)

## 2017-11-02 LAB — LIPID PANEL
CHOL/HDL RATIO: 3
CHOLESTEROL: 206 mg/dL — AB (ref 0–200)
HDL: 59.9 mg/dL (ref >39.00)
LDL CALC: 120 mg/dL — AB (ref 0–99)
NONHDL: 145.67
Triglycerides: 127 mg/dL (ref 0.0–149.0)
VLDL: 25.4 mg/dL (ref 0.0–40.0)

## 2017-11-02 LAB — TSH: TSH: 2.7 u[IU]/mL (ref 0.35–4.50)

## 2017-11-02 LAB — HEMOGLOBIN A1C: HEMOGLOBIN A1C: 5.6 % (ref 4.6–6.5)

## 2017-11-02 MED ORDER — LEVOTHYROXINE SODIUM 25 MCG PO TABS: ORAL_TABLET | ORAL | 3 refills | Status: DC

## 2017-11-02 MED ORDER — OMEPRAZOLE 20 MG PO CPDR: 20.0000 mg | DELAYED_RELEASE_CAPSULE | Freq: Every day | ORAL | 3 refills | Status: DC

## 2017-11-02 MED ORDER — PRAVASTATIN SODIUM 10 MG PO TABS: 10.0000 mg | ORAL_TABLET | Freq: Every day | ORAL | 1 refills | Status: DC

## 2017-11-02 NOTE — Assessment & Plan Note (Signed)
Check lipid panel  Continue daily statin Regular exercise and healthy diet encouraged  

## 2017-11-02 NOTE — Assessment & Plan Note (Signed)
Taking omeprazole and ranitidine - unable to stop the omeprazole  Continue both gerd controlled

## 2017-11-02 NOTE — Assessment & Plan Note (Signed)
Check tsh  Titrate med dose if needed  

## 2017-11-02 NOTE — Assessment & Plan Note (Signed)
Check a1c Low sugar / carb diet Stressed regular exercise   

## 2017-11-02 NOTE — Assessment & Plan Note (Signed)
Due for prolia - she is working on getting patient assistance

## 2017-11-02 NOTE — Assessment & Plan Note (Addendum)
Elevated here today but has been controlled at home Advised her to continue to monitor at home - may need to monitor more often to make sure it is well controlled Continue current medications Cmp, tsh, cbc

## 2017-11-02 NOTE — Assessment & Plan Note (Addendum)
Has seen ortho and dr Vedia Pereyra tylenol as needed Knee pain during the day is likely related to OA, nighttime pain may be lumbar radiculopathy

## 2017-11-27 ENCOUNTER — Telehealth: Payer: Self-pay | Admitting: Internal Medicine

## 2017-11-27 NOTE — Telephone Encounter (Signed)
Copied from [place] [id number]. Topic: Quick Communication - See Telephone Encounter >> Nov 27, 2017  2:39 PM Rutherford Nail, NT wrote: CRM for notification. See Telephone encounter for: 11/27/17. Lower back pain x 2 days. Stomach pain started today. No bowel movement x 2 days. Would like to know what she could take to help her go?  Normally take fiber 2-3 times a day. States she has taken it 3x for the last 2 days with no help. CB#: [phone]

## 2017-11-27 NOTE — Telephone Encounter (Signed)
Spoke with pt and advised she could try Miralax OTC for constipation.

## 2017-11-29 ENCOUNTER — Telehealth: Payer: Self-pay | Admitting: Internal Medicine

## 2017-11-29 NOTE — Telephone Encounter (Signed)
Low back pain and no defecation x 4 days  Some abdominal pain also  This is unusual  No fevers but have been "chilled"  She has taken MiraLax last couple of days 1 dose each day and no BM  No activity changes, no med changes  Rec:  Dulcolax 5 mg x 4, wait 1 hr, drink 4 doses MiraLax in 1 hour  Call back prn  May need increased fiber, or more reg use MiraLax

## 2017-11-30 ENCOUNTER — Telehealth: Payer: Self-pay | Admitting: Family Medicine

## 2017-11-30 NOTE — Telephone Encounter (Signed)
Copied from [place]. Topic: Quick Communication - See Telephone Encounter >> Nov 30, 2017  9:23 AM Cleaster Corin, NT wrote: CRM for notification. See Telephone encounter for: 11/30/17.  Pt. Calling to get a referral to have a steroid shot. Pt. Stated that Hulan Saas had set up the referral last time.

## 2017-12-01 ENCOUNTER — Ambulatory Visit (INDEPENDENT_AMBULATORY_CARE_PROVIDER_SITE_OTHER): Payer: PPO | Admitting: Family Medicine

## 2017-12-01 ENCOUNTER — Encounter: Payer: Self-pay | Admitting: Family Medicine

## 2017-12-01 ENCOUNTER — Ambulatory Visit (INDEPENDENT_AMBULATORY_CARE_PROVIDER_SITE_OTHER): Payer: PPO | Admitting: *Deleted

## 2017-12-01 ENCOUNTER — Ambulatory Visit: Payer: PPO | Admitting: Internal Medicine

## 2017-12-01 VITALS — BP 134/78 | HR 64 | Resp 18 | Ht 64.0 in | Wt 133.0 lb

## 2017-12-01 DIAGNOSIS — E2839 Other primary ovarian failure: Secondary | ICD-10-CM

## 2017-12-01 DIAGNOSIS — M5136 Other intervertebral disc degeneration, lumbar region: Secondary | ICD-10-CM | POA: Diagnosis not present

## 2017-12-01 DIAGNOSIS — Z0000 Encounter for general adult medical examination without abnormal findings: Secondary | ICD-10-CM | POA: Diagnosis not present

## 2017-12-01 MED ORDER — PREDNISONE 5 MG PO TABS: ORAL_TABLET | ORAL | 0 refills | Status: DC

## 2017-12-01 NOTE — Progress Notes (Signed)
[name] [name] - 77 y.o. female MRN [id number]  Date of birth: Nov 11, 1940  SUBJECTIVE:  Including CC & ROS.  Chief Complaint  Patient presents with  . Back Pain    [name] [name] is a 77 y.o. female that is presenting with low back pain. Pain has been increasing for one week. Denies tingling or numbness in her legs. She received a previous epidural injection in 2017 with improvement. Pain is throbbing in nature. Pain is constant. Pain is worse when ambulating.  Denies any radicular symptoms today.  Has not tried any medications for this pain.  Denies any urinary incontinence or saddle anesthesia.  Review of her lumbar spine x-ray from 2017 shows an L1 mild compression fracture and diffuse osteopenia scoliosis   Review of Systems  Constitutional: Negative for fever.  HENT: Negative for congestion.   Respiratory: Negative for cough.   Cardiovascular: Negative for chest pain.  Gastrointestinal: Negative for abdominal pain.  Musculoskeletal: Positive for back pain.  Skin: Negative for color change.  Neurological: Negative for weakness.  Hematological: Negative for adenopathy.  Psychiatric/Behavioral: Negative for agitation.    HISTORY: Past Medical, Surgical, Social, and Family History Reviewed & Updated per EMR.   Pertinent Historical Findings include:  Past Medical History:  Diagnosis Date  . Benign neoplasm of colon   . Cataract    bilateral,had surgery  . DDD (degenerative disc disease), lumbar    MRI w. [facility] 05/2015, s/p ESI x 1 w/ improvement  . Diaphragmatic hernia without mention of obstruction or gangrene   . Diverticulosis of colon (without mention of hemorrhage)   . DJD (degenerative joint disease) of knee    L>R, IA cortisone by [facility] in L 03/2015  . Esophageal reflux   . Gallstones   . History of chicken pox   . Hyperlipidemia   . Hypertension   . Irritable bowel syndrome   . Migraines   . Obesity, unspecified   . Osteoporosis, unspecified   . Tachycardia    . Unspecified hemorrhoids with other complication     Past Surgical History:  Procedure Laterality Date  . APPENDECTOMY  1979  . CHOLECYSTECTOMY  1989  . COLONOSCOPY    . POLYPECTOMY    . VAGINAL HYSTERECTOMY  1996    No Known Allergies  Family History  Problem Relation Age of Onset  . Heart disease Sister        x 2  . Heart disease Brother   . Stroke Mother   . Colon cancer Sister   . Stroke Sister 27     Social History   Socioeconomic History  . Marital status: Married    Spouse name: Not on file  . Number of children: Not on file  . Years of education: Not on file  . Highest education level: Not on file  Occupational History  . Not on file  Social Needs  . Financial resource strain: Not hard at all  . Food insecurity:    Worry: Never true    Inability: Never true  . Transportation needs:    Medical: No    Non-medical: No  Tobacco Use  . Smoking status: Never Smoker  . Smokeless tobacco: Never Used  Substance and Sexual Activity  . Alcohol use: No    Alcohol/week: 0.0 oz  . Drug use: No  . Sexual activity: Not Currently  Lifestyle  . Physical activity:    Days per week: 3 days    Minutes per session: 60  min  . Stress: To some extent  Relationships  . Social connections:    Talks on phone: More than three times a week    Gets together: More than three times a week    Attends religious service: More than 4 times per year    Active member of club or organization: Yes    Attends meetings of clubs or organizations: More than 4 times per year    Relationship status: Married  . Intimate partner violence:    Fear of current or ex partner: No    Emotionally abused: No    Physically abused: No    Forced sexual activity: No  Other Topics Concern  . Not on file  Social History Narrative   Exercise: no regular exercise     PHYSICAL EXAM:  VS: BP 134/78   Pulse 64   Ht 5\' 4"  (1.626 m)   Wt 133 lb (60.3 kg)   SpO2 98%   BMI 22.83 kg/m    Physical Exam Gen: NAD, alert, cooperative with exam, well-appearing ENT: normal lips, normal nasal mucosa,  Eye: normal EOM, normal conjunctiva and lids CV:  no edema, +2 pedal pulses   Resp: no accessory muscle use, non-labored,  Skin: no rashes, no areas of induration  Neuro: normal tone, normal sensation to touch Psych:  normal insight, alert and oriented MSK:  Back: Some mild tenderness to palpation over the paraspinal lumbar muscles. No tenderness palpation of midline lumbar spine. Normal internal and external rotation of the hips. Normal strength resistance with hip flexion. Negative straight leg raise bilaterally. Normal gait. Normal strength resistance with plantar dorsiflexion. Neurovascularly intact    ASSESSMENT & PLAN:   DDD (degenerative disc disease), lumbar Low back pain likely related to axial changes. No radiculopathy in nature.  - prednisone  - counseled on HEP  - if no improvement consider PT

## 2017-12-01 NOTE — Patient Instructions (Signed)
Please try the steroids  Please try the exercises  Please try heat over the area  Please follow up in 3-4 weeks if your pain hasn't improved  Please follow up if your bowel movements haven't returned to normal.

## 2017-12-01 NOTE — Patient Instructions (Signed)
Continue doing brain stimulating activities (puzzles, reading, adult coloring books, staying active) to keep memory sharp.   Continue to eat heart healthy diet (full of fruits, vegetables, whole grains, lean protein, water--limit salt, fat, and sugar intake) and increase physical activity as tolerated.   [name] [name] , Thank you for taking time to come for your Medicare Wellness Visit. I appreciate your ongoing commitment to your health goals. Please review the following plan we discussed and let me know if I can assist you in the future.   These are the goals we discussed: Goals    . Patient Stated     Go shopping and use all of the gift cards that I have. Continue to take time for myself daily to read, relax, look over catalogs and sale papers.    . Weight < 200 lb (90.719 kg)     Wants to lose 10lbs; would like to come off of some medication. Would like to stop taking chol. Educate appropriately       This is a list of the screening recommended for you and due dates:  Health Maintenance  Topic Date Due  . DEXA scan (bone density measurement)  06/26/2017  . Flu Shot  02/25/2018  . Colon Cancer Screening  06/24/2021  . Tetanus Vaccine  10/08/2023  . Pneumonia vaccines  Completed   Health Maintenance, Female Adopting a healthy lifestyle and getting preventive care can go a long way to promote health and wellness. Talk with your health care provider about what schedule of regular examinations is right for you. This is a good chance for you to check in with your provider about disease prevention and staying healthy. In between checkups, there are plenty of things you can do on your own. Experts have done a lot of research about which lifestyle changes and preventive measures are most likely to keep you healthy. Ask your health care provider for more information. Weight and diet Eat a healthy diet  Be sure to include plenty of vegetables, fruits, low-fat dairy products, and lean  protein.  Do not eat a lot of foods high in solid fats, added sugars, or salt.  Get regular exercise. This is one of the most important things you can do for your health. ? Most adults should exercise for at least 150 minutes each week. The exercise should increase your heart rate and make you sweat (moderate-intensity exercise). ? Most adults should also do strengthening exercises at least twice a week. This is in addition to the moderate-intensity exercise.  Maintain a healthy weight  Body mass index (BMI) is a measurement that can be used to identify possible weight problems. It estimates body fat based on height and weight. Your health care provider can help determine your BMI and help you achieve or maintain a healthy weight.  For females 16 years of age and older: ? A BMI below 18.5 is considered underweight. ? A BMI of 18.5 to 24.9 is normal. ? A BMI of 25 to 29.9 is considered overweight. ? A BMI of 30 and above is considered obese.  Watch levels of cholesterol and blood lipids  You should start having your blood tested for lipids and cholesterol at 79 years of age, then have this test every 5 years.  You may need to have your cholesterol levels checked more often if: ? Your lipid or cholesterol levels are high. ? You are older than 79 years of age. ? You are at high risk for heart disease.  Cancer screening Lung Cancer  Lung cancer screening is recommended for adults 47-31 years old who are at high risk for lung cancer because of a history of smoking.  A yearly low-dose CT scan of the lungs is recommended for people who: ? Currently smoke. ? Have quit within the past 15 years. ? Have at least a 30-pack-year history of smoking. A pack year is smoking an average of one pack of cigarettes a day for 1 year.  Yearly screening should continue until it has been 15 years since you quit.  Yearly screening should stop if you develop a health problem that would prevent you from  having lung cancer treatment.  Breast Cancer  Practice breast self-awareness. This means understanding how your breasts normally appear and feel.  It also means doing regular breast self-exams. Let your health care provider know about any changes, no matter how small.  If you are in your 20s or 30s, you should have a clinical breast exam (CBE) by a health care provider every 1-3 years as part of a regular health exam.  If you are 52 or older, have a CBE every year. Also consider having a breast X-ray (mammogram) every year.  If you have a family history of breast cancer, talk to your health care provider about genetic screening.  If you are at high risk for breast cancer, talk to your health care provider about having an MRI and a mammogram every year.  Breast cancer gene (BRCA) assessment is recommended for women who have family members with BRCA-related cancers. BRCA-related cancers include: ? Breast. ? Ovarian. ? Tubal. ? Peritoneal cancers.  Results of the assessment will determine the need for genetic counseling and BRCA1 and BRCA2 testing.  Cervical Cancer Your health care provider may recommend that you be screened regularly for cancer of the pelvic organs (ovaries, uterus, and vagina). This screening involves a pelvic examination, including checking for microscopic changes to the surface of your cervix (Pap test). You may be encouraged to have this screening done every 3 years, beginning at age 41.  For women ages 37-65, health care providers may recommend pelvic exams and Pap testing every 3 years, or they may recommend the Pap and pelvic exam, combined with testing for human papilloma virus (HPV), every 5 years. Some types of HPV increase your risk of cervical cancer. Testing for HPV may also be done on women of any age with unclear Pap test results.  Other health care providers may not recommend any screening for nonpregnant women who are considered low risk for pelvic cancer  and who do not have symptoms. Ask your health care provider if a screening pelvic exam is right for you.  If you have had past treatment for cervical cancer or a condition that could lead to cancer, you need Pap tests and screening for cancer for at least 20 years after your treatment. If Pap tests have been discontinued, your risk factors (such as having a new sexual partner) need to be reassessed to determine if screening should resume. Some women have medical problems that increase the chance of getting cervical cancer. In these cases, your health care provider may recommend more frequent screening and Pap tests.  Colorectal Cancer  This type of cancer can be detected and often prevented.  Routine colorectal cancer screening usually begins at 79 years of age and continues through 79 years of age.  Your health care provider may recommend screening at an earlier age if you have risk factors  for colon cancer.  Your health care provider may also recommend using home test kits to check for hidden blood in the stool.  A small camera at the end of a tube can be used to examine your colon directly (sigmoidoscopy or colonoscopy). This is done to check for the earliest forms of colorectal cancer.  Routine screening usually begins at age 42.  Direct examination of the colon should be repeated every 5-10 years through 79 years of age. However, you may need to be screened more often if early forms of precancerous polyps or small growths are found.  Skin Cancer  Check your skin from head to toe regularly.  Tell your health care provider about any new moles or changes in moles, especially if there is a change in a mole's shape or color.  Also tell your health care provider if you have a mole that is larger than the size of a pencil eraser.  Always use sunscreen. Apply sunscreen liberally and repeatedly throughout the day.  Protect yourself by wearing long sleeves, pants, a wide-brimmed hat, and  sunglasses whenever you are outside.  Heart disease, diabetes, and high blood pressure  High blood pressure causes heart disease and increases the risk of stroke. High blood pressure is more likely to develop in: ? People who have blood pressure in the high end of the normal range (130-139/85-89 mm Hg). ? People who are overweight or obese. ? People who are African American.  If you are 65-43 years of age, have your blood pressure checked every 3-5 years. If you are 10 years of age or older, have your blood pressure checked every year. You should have your blood pressure measured twice-once when you are at a hospital or clinic, and once when you are not at a hospital or clinic. Record the average of the two measurements. To check your blood pressure when you are not at a hospital or clinic, you can use: ? An automated blood pressure machine at a pharmacy. ? A home blood pressure monitor.  If you are between 36 years and 71 years old, ask your health care provider if you should take aspirin to prevent strokes.  Have regular diabetes screenings. This involves taking a blood sample to check your fasting blood sugar level. ? If you are at a normal weight and have a low risk for diabetes, have this test once every three years after 79 years of age. ? If you are overweight and have a high risk for diabetes, consider being tested at a younger age or more often. Preventing infection Hepatitis B  If you have a higher risk for hepatitis B, you should be screened for this virus. You are considered at high risk for hepatitis B if: ? You were born in a country where hepatitis B is common. Ask your health care provider which countries are considered high risk. ? Your parents were born in a high-risk country, and you have not been immunized against hepatitis B (hepatitis B vaccine). ? You have HIV or AIDS. ? You use needles to inject street drugs. ? You live with someone who has hepatitis B. ? You have  had sex with someone who has hepatitis B. ? You get hemodialysis treatment. ? You take certain medicines for conditions, including cancer, organ transplantation, and autoimmune conditions.  Hepatitis C  Blood testing is recommended for: ? Everyone born from [birth year] through 1967. ? Anyone with known risk factors for hepatitis C.  Sexually transmitted infections (STIs)  You should be screened for sexually transmitted infections (STIs) including gonorrhea and chlamydia if: ? You are sexually active and are younger than 79 years of age. ? You are older than 79 years of age and your health care provider tells you that you are at risk for this type of infection. ? Your sexual activity has changed since you were last screened and you are at an increased risk for chlamydia or gonorrhea. Ask your health care provider if you are at risk.  If you do not have HIV, but are at risk, it may be recommended that you take a prescription medicine daily to prevent HIV infection. This is called pre-exposure prophylaxis (PrEP). You are considered at risk if: ? You are sexually active and do not regularly use condoms or know the HIV status of your partner(s). ? You take drugs by injection. ? You are sexually active with a partner who has HIV.  Talk with your health care provider about whether you are at high risk of being infected with HIV. If you choose to begin PrEP, you should first be tested for HIV. You should then be tested every 3 months for as long as you are taking PrEP. Pregnancy  If you are premenopausal and you may become pregnant, ask your health care provider about preconception counseling.  If you may become pregnant, take 400 to 800 micrograms (mcg) of folic acid every day.  If you want to prevent pregnancy, talk to your health care provider about birth control (contraception). Osteoporosis and menopause  Osteoporosis is a disease in which the bones lose minerals and strength with aging. This  can result in serious bone fractures. Your risk for osteoporosis can be identified using a bone density scan.  If you are 70 years of age or older, or if you are at risk for osteoporosis and fractures, ask your health care provider if you should be screened.  Ask your health care provider whether you should take a calcium or vitamin D supplement to lower your risk for osteoporosis.  Menopause may have certain physical symptoms and risks.  Hormone replacement therapy may reduce some of these symptoms and risks. Talk to your health care provider about whether hormone replacement therapy is right for you. Follow these instructions at home:  Schedule regular health, dental, and eye exams.  Stay current with your immunizations.  Do not use any tobacco products including cigarettes, chewing tobacco, or electronic cigarettes.  If you are pregnant, do not drink alcohol.  If you are breastfeeding, limit how much and how often you drink alcohol.  Limit alcohol intake to no more than 1 drink per day for nonpregnant women. One drink equals 12 ounces of beer, 5 ounces of Ricky Doan, or 1 ounces of hard liquor.  Do not use street drugs.  Do not share needles.  Ask your health care provider for help if you need support or information about quitting drugs.  Tell your health care provider if you often feel depressed.  Tell your health care provider if you have ever been abused or do not feel safe at home. This information is not intended to replace advice given to you by your health care provider. Make sure you discuss any questions you have with your health care provider. Document Released: 01/27/2011 Document Revised: 12/20/2015 Document Reviewed: 04/17/2015 Elsevier Interactive Patient Education  [employer]. It is important to avoid accidents which may result in broken bones.  Here are a few ideas on how to make  your home safer so you will be less likely to trip or fall.  1. Use nonskid  mats or non slip strips in your shower or tub, on your bathroom floor and around sinks.  If you know that you have spilled water, wipe it up! 2. In the bathroom, it is important to have properly installed grab bars on the walls or on the edge of the tub.  Towel racks are NOT strong enough for you to hold onto or to pull on for support. 3. Stairs and hallways should have enough light.  Add lamps or night lights if you need ore light. 4. It is good to have handrails on both sides of the stairs if possible.  Always fix broken handrails right away. 5. It is important to see the edges of steps.  Paint the edges of outdoor steps white so you can see them better.  Put colored tape on the edge of inside steps. 6. Throw-rugs are dangerous because they can slide.  Removing the rugs is the best idea, but if they must stay, add adhesive carpet tape to prevent slipping. 7. Do not keep things on stairs or in the halls.  Remove small furniture that blocks the halls as it may cause you to trip.  Keep telephone and electrical cords out of the way where you walk. 8. Always were sturdy, rubber-soled shoes for good support.  Never wear just socks, especially on the stairs.  Socks may cause you to slip or fall.  Do not wear full-length housecoats as you can easily trip on the bottom.  9. Place the things you use the most on the shelves that are the easiest to reach.  If you use a stepstool, make sure it is in good condition.  If you feel unsteady, DO NOT climb, ask for help. 10. If a health professional advises you to use a cane or walker, do not be ashamed.  These items can keep you from falling and breaking your bones.

## 2017-12-01 NOTE — Assessment & Plan Note (Signed)
Low back pain likely related to axial changes. No radiculopathy in nature.  - prednisone  - counseled on HEP  - if no improvement consider PT

## 2017-12-01 NOTE — Progress Notes (Addendum)
Subjective:   [name] [name] is a 77 y.o. female who presents for Medicare Annual (Subsequent) preventive examination.  Review of Systems:  No ROS.  Medicare Wellness Visit. Additional risk factors are reflected in the social history.  Cardiac Risk Factors include: advanced age (>1men, >38 women);hypertension Sleep patterns: feels rested on waking, gets up 1-2 times nightly to void and sleeps 7-8 hours nightly.    Home Safety/Smoke Alarms: Feels safe in home. Smoke alarms in place.  Living environment; residence and Firearm Safety: [place], no firearms Lives with husband, no needs for DME, good support system. Seat Belt Safety/Bike Helmet: Wears seat belt.     Objective:     Vitals: BP 134/78   Pulse 64   Resp 18   Ht 5\' 4"  (1.626 m)   Wt 133 lb (60.3 kg)   SpO2 98%   BMI 22.83 kg/m   Body mass index is 22.83 kg/m.  Advanced Directives 12/01/2017 11/26/2016 10/31/2014  Does Patient Have a Medical Advance Directive? Yes Yes No  Type of Paramedic of [place];Living will [place];Living will -  Copy of [place] in Chart? No - copy requested No - copy requested -  Would patient like information on creating a medical advance directive? - - Yes - Educational materials given    Tobacco Social History   Tobacco Use  Smoking Status Never Smoker  Smokeless Tobacco Never Used     Counseling given: Not Answered  Past Medical History:  Diagnosis Date  . Benign neoplasm of colon   . Cataract    bilateral,had surgery  . DDD (degenerative disc disease), lumbar    MRI w. [facility] 05/2015, s/p ESI x 1 w/ improvement  . Diaphragmatic hernia without mention of obstruction or gangrene   . Diverticulosis of colon (without mention of hemorrhage)   . DJD (degenerative joint disease) of knee    L>R, IA cortisone by [facility] in L 03/2015  . Esophageal reflux   . Gallstones   . History of chicken pox   .  Hyperlipidemia   . Hypertension   . Irritable bowel syndrome   . Migraines   . Obesity, unspecified   . Osteoporosis, unspecified   . Tachycardia   . Unspecified hemorrhoids with other complication    Past Surgical History:  Procedure Laterality Date  . APPENDECTOMY  1979  . CHOLECYSTECTOMY  1989  . COLONOSCOPY    . POLYPECTOMY    . VAGINAL HYSTERECTOMY  1996   Family History  Problem Relation Age of Onset  . Heart disease Sister        x 2  . Heart disease Brother   . Stroke Mother   . Colon cancer Sister   . Stroke Sister 59   Social History   Socioeconomic History  . Marital status: Married    Spouse name: Not on file  . Number of children: Not on file  . Years of education: Not on file  . Highest education level: Not on file  Occupational History  . Not on file  Social Needs  . Financial resource strain: Not hard at all  . Food insecurity:    Worry: Never true    Inability: Never true  . Transportation needs:    Medical: No    Non-medical: No  Tobacco Use  . Smoking status: Never Smoker  . Smokeless tobacco: Never Used  Substance and Sexual Activity  . Alcohol use: No  Alcohol/week: 0.0 oz  . Drug use: No  . Sexual activity: Not Currently  Lifestyle  . Physical activity:    Days per week: 3 days    Minutes per session: 60 min  . Stress: To some extent  Relationships  . Social connections:    Talks on phone: More than three times a week    Gets together: More than three times a week    Attends religious service: More than 4 times per year    Active member of club or organization: Yes    Attends meetings of clubs or organizations: More than 4 times per year    Relationship status: Married  Other Topics Concern  . Not on file  Social History Narrative   Exercise: no regular exercise    Outpatient Encounter Medications as of 12/01/2017  Medication Sig  . acetic acid-hydrocortisone (VOSOL-HC) otic solution Place 3 drops into both ears 2 (two)  times daily as needed.  [name] aspirin 81 MG EC tablet Take 81 mg by mouth daily. Ecotrin  . Biotin 5 MG CAPS Taking daily  . bisoprolol-hydrochlorothiazide (ZIAC) 5-6.25 MG tablet Take 1 tablet by mouth daily.  . calcium-vitamin D (OSCAL WITH D) 500-200 MG-UNIT per tablet Take 1 tablet by mouth 2 (two) times daily.   . clobetasol ointment (TEMOVATE) 7.16 % Apply 1 application topically 2 (two) times daily.  [name] denosumab (PROLIA) 60 MG/ML SOLN injection Inject 60 mg into the skin every 6 (six) months. Administer in upper arm, thigh, or abdomen  . ibuprofen (ADVIL,MOTRIN) 200 MG tablet Take 600 mg by mouth 3 (three) times daily. With meals  . levothyroxine (SYNTHROID, LEVOTHROID) 25 MCG tablet TAKE 1 CAPSULE BY MOUTH DAILY BEFORE BREAKFAST.  . Multiple Vitamins-Minerals (CENTRUM SILVER PO) Take by mouth daily.    [name] nystatin cream (MYCOSTATIN) Apply 1 application topically 2 (two) times daily as needed.  [name] omeprazole (PRILOSEC) 20 MG capsule Take 1 capsule (20 mg total) by mouth daily.  . phenylephrine-shark liver oil-mineral oil-petrolatum (PREPARATION H) 0.25-3-14-71.9 % rectal ointment Place 1 application rectally 2 (two) times daily as needed for hemorrhoids.  . pravastatin (PRAVACHOL) 10 MG tablet Take 1 tablet (10 mg total) by mouth daily.  . ranitidine (ZANTAC) 150 MG tablet Take 1 tablet (150 mg total) by mouth 2 (two) times daily.  . Wheat Dextrin (BENEFIBER) POWD Twice daily   No facility-administered encounter medications on file as of 12/01/2017.     Activities of Daily Living In your present state of health, do you have any difficulty performing the following activities: 12/01/2017  Hearing? N  Vision? N  Difficulty concentrating or making decisions? N  Walking or climbing stairs? N  Dressing or bathing? N  Doing errands, shopping? N  Preparing Food and eating ? N  Using the Toilet? N  In the past six months, have you accidently leaked urine? N  Do you have problems with loss of bowel  control? N  Managing your Medications? N  Managing your Finances? N  Housekeeping or managing your Housekeeping? N  Some recent data might be hidden    Patient Care Team: Binnie Rail, MD as PCP - General (Internal Medicine) Ladene Artist, MD (Gastroenterology) Molli Posey, MD (Obstetrics and Gynecology) Luberta Mutter, MD (Ophthalmology) Josue Hector, MD (Cardiology) Netta Cedars, MD (Orthopedic Surgery) Suella Broad, MD (Physical Medicine and Rehabilitation)    Assessment:   This is a routine wellness examination for [facility] Physical assessment deferred to PCP.   Exercise  Activities and Dietary recommendations Current Exercise Habits: The patient does not participate in regular exercise at present, Exercise limited by: orthopedic condition(s)  Diet (meal preparation, eat out, water intake, caffeinated beverages, dairy products, fruits and vegetables): in general, a "healthy" diet  , well balanced. eats a variety of fruits and vegetables daily, limits salt, fat/cholesterol, sugar,carbohydrates,caffeine, drinks 3-4 glasses of water daily.  Encouraged patient to increase daily water intake.   Goals    . Patient Stated     Go shopping and use all of the gift cards that I have. Continue to take time for myself daily to read, relax, look over catalogs and sale papers.    . Weight < 200 lb (90.719 kg)     Wants to lose 10lbs; would like to come off of some medication. Would like to stop taking chol. Educate appropriately       Fall Risk Fall Risk  12/01/2017 11/26/2016 04/30/2016 10/31/2014 06/09/2014  Falls in the past year? No Yes No No No  Number falls in past yr: - 1 - - -  Injury with Fall? - No - - -  Risk for fall due to : - - - (No Data) -  Risk for fall due to: Comment - - - States she is careful; aware of fall risk -  Follow up - Education provided;Falls prevention discussed - - -    Depression Screen PHQ 2/9 Scores 12/01/2017 11/26/2016 04/30/2016 10/31/2014    PHQ - 2 Score 1 0 0 0  PHQ- 9 Score 2 - - -     Cognitive Function MMSE - Mini Mental State Exam 12/01/2017 11/26/2016  Orientation to time 5 5  Orientation to Place 5 5  Registration 3 3  Attention/ Calculation 4 5  Recall 3 2  Language- name 2 objects 2 2  Language- repeat 1 1  Language- follow 3 step command 3 3  Language- read & follow direction 1 1  Write a sentence 1 1  Copy design 1 1  Total score 29 29        Immunization History  Administered Date(s) Administered  . Influenza Split 05/28/2011, 06/23/2012  . Influenza Whole 05/25/2007, 05/31/2010  . Influenza, High Dose Seasonal PF 06/27/2015, 04/30/2016, 05/01/2017  . Influenza,inj,Quad PF,6+ Mos 04/20/2013, 06/13/2014  . Pneumococcal Conjugate-13 10/31/2014  . Pneumococcal Polysaccharide-23 10/07/2013  . Td 10/07/2013  . Zoster 10/31/2014   Screening Tests Health Maintenance  Topic Date Due  . DEXA SCAN  06/26/2017  . INFLUENZA VACCINE  02/25/2018  . COLONOSCOPY  06/24/2021  . TETANUS/TDAP  10/08/2023  . PNA vac Low Risk Adult  Completed      Plan:    Continue doing brain stimulating activities (puzzles, reading, adult coloring books, staying active) to keep memory sharp.   Continue to eat heart healthy diet (full of fruits, vegetables, whole grains, lean protein, water--limit salt, fat, and sugar intake) and increase physical activity as tolerated.   I have personally reviewed and noted the following in the patient's chart:   . Medical and social history . Use of alcohol, tobacco or illicit drugs  . Current medications and supplements . Functional ability and status . Nutritional status . Physical activity . Advanced directives . List of other physicians . Vitals . Screenings to include cognitive, depression, and falls . Referrals and appointments  In addition, I have reviewed and discussed with patient certain preventive protocols, quality metrics, and best practice recommendations. A written  personalized care plan for preventive  services as well as general preventive health recommendations were provided to patient.     Michiel Cowboy, RN  12/01/2017    Medical screening examination/treatment/procedure(s) were performed by non-physician practitioner and as supervising physician I was immediately available for consultation/collaboration. I agree with above. Binnie Rail, MD

## 2017-12-02 ENCOUNTER — Telehealth: Payer: Self-pay

## 2017-12-02 NOTE — Telephone Encounter (Signed)
Patient has been approved again for healthwell funding to use with prolia injections, approval #[id number]---patient has already paid copayment for prolia $218---Janely Gullickson has receipt in patient's folder---when she makes appt to come in for next prolia, no copay will be required and we will need to fill out reimbursement form from already paid copay----pt will need to sign a portion on that form---see Raeqwon Lux to get patient's signature

## 2017-12-03 ENCOUNTER — Telehealth: Payer: Self-pay | Admitting: Gastroenterology

## 2017-12-03 NOTE — Telephone Encounter (Signed)
Routed to DOD, patient of Dr. Fuller Plan. Please advise.

## 2017-12-03 NOTE — Telephone Encounter (Signed)
Pt has not had a bm since Monday despite following Dr. Celesta Aver instructions on 11/29/17.

## 2017-12-03 NOTE — Telephone Encounter (Signed)
Recommend trial of a few fleet enemas if she wishes to try that first. Is she in any pain at all, or any vomiting? If she is tolerating PO and not in pain / vomiting, after enemas can try half a Miralax bowel prep and see if this helps produce a bowel movement.

## 2017-12-03 NOTE — Telephone Encounter (Signed)
Spoke with pt and let her know the recommendations. Pt feels she may have a blockage. Reports she does not have the urge to have a BM. She is able to eat and drink and has not had any vomiting. Discussed with pt that she can try the enemas then the miralax if that helps. Also discussed she could go to the ER or urgent care if she feels she has a blockage. Pt verbalized understanding.

## 2017-12-07 NOTE — Telephone Encounter (Signed)
See previous messages .  Patient has not had a BM since Saturday morning.  She reports that she has not been taking the Miralax daily as recommended.  She is advised to start Miralax BID along with her fiber regimen.  She will call back if this fails to help and will need an office visit .

## 2017-12-07 NOTE — Telephone Encounter (Signed)
Patient states she has tried everything and has only had one BM on Saturday 5.11.19 and is still having back pain. Patient wants to know what next steps are.

## 2017-12-08 ENCOUNTER — Ambulatory Visit (INDEPENDENT_AMBULATORY_CARE_PROVIDER_SITE_OTHER): Payer: PPO

## 2017-12-08 DIAGNOSIS — M810 Age-related osteoporosis without current pathological fracture: Secondary | ICD-10-CM | POA: Diagnosis not present

## 2017-12-08 MED ORDER — DENOSUMAB 60 MG/ML ~~LOC~~ SOSY
60.0000 mg | PREFILLED_SYRINGE | Freq: Once | SUBCUTANEOUS | Status: AC
  Administered 2017-12-08: 60 mg via SUBCUTANEOUS

## 2017-12-08 NOTE — Telephone Encounter (Signed)
Pt called back. Please call back. °

## 2017-12-08 NOTE — Progress Notes (Signed)
prolia Injection given.   [name] [name] J [name] Tamer, MD  

## 2017-12-08 NOTE — Telephone Encounter (Signed)
Patient recd injection today on nurse visit and she has signed healthwell form---patient to bring credit card statement (showing proof of payment) back to our office after she receives in the mail---healthwell form and all necessary copies required by healthwell are in [facility] folder in prolia cabinet and will be faxed to healthwell to start reimbursement request after all papers are received back from the patient

## 2017-12-15 ENCOUNTER — Other Ambulatory Visit: Payer: Self-pay | Admitting: Internal Medicine

## 2017-12-15 ENCOUNTER — Encounter (INDEPENDENT_AMBULATORY_CARE_PROVIDER_SITE_OTHER): Payer: Self-pay

## 2017-12-15 ENCOUNTER — Encounter: Payer: Self-pay | Admitting: Gastroenterology

## 2017-12-15 ENCOUNTER — Ambulatory Visit (INDEPENDENT_AMBULATORY_CARE_PROVIDER_SITE_OTHER): Payer: PPO | Admitting: Gastroenterology

## 2017-12-15 ENCOUNTER — Other Ambulatory Visit (INDEPENDENT_AMBULATORY_CARE_PROVIDER_SITE_OTHER): Payer: PPO

## 2017-12-15 VITALS — BP 126/80 | HR 72 | Ht 63.0 in | Wt 133.2 lb

## 2017-12-15 DIAGNOSIS — R1084 Generalized abdominal pain: Secondary | ICD-10-CM | POA: Diagnosis not present

## 2017-12-15 DIAGNOSIS — K5904 Chronic idiopathic constipation: Secondary | ICD-10-CM

## 2017-12-15 DIAGNOSIS — K219 Gastro-esophageal reflux disease without esophagitis: Secondary | ICD-10-CM

## 2017-12-15 LAB — BUN: BUN: 13 mg/dL (ref 6–23)

## 2017-12-15 LAB — CREATININE, SERUM: CREATININE: 0.63 mg/dL (ref 0.40–1.20)

## 2017-12-15 MED ORDER — DICYCLOMINE HCL 10 MG PO CAPS: 10.0000 mg | ORAL_CAPSULE | Freq: Three times a day (TID) | ORAL | 11 refills | Status: DC

## 2017-12-15 NOTE — Patient Instructions (Signed)
Your provider has requested that you go to the basement level for lab work before leaving today. Press "B" on the elevator. The lab is located at the first door on the left as you exit the elevator.   We have sent the following medications to your pharmacy for you to pick up at your convenience:dicylclomine.  You have been scheduled for a CT scan of the abdomen and pelvis at [facility] ([address]---this is in the same building as [facility]).   You are scheduled on 12/24/17 at 3:30pm. You should arrive 15 minutes prior to your appointment time for registration. Please follow the written instructions below on the day of your exam:  WARNING: IF YOU ARE ALLERGIC TO IODINE/X-RAY DYE, PLEASE NOTIFY RADIOLOGY IMMEDIATELY AT [phone]! YOU WILL BE GIVEN A 13 HOUR PREMEDICATION PREP.  1) Do not eat or drink anything after 11:30am (4 hours prior to your test) 2) You have been given 2 bottles of oral contrast to drink. The solution may taste better if refrigerated, but do NOT add ice or any other liquid to this solution. Shake well before drinking.    Drink 1 bottle of contrast @ 1:30pm (2 hours prior to your exam)  Drink 1 bottle of contrast @ 2:30pm (1 hour prior to your exam)  You may take any medications as prescribed with a small amount of water except for the following: Metformin, Glucophage, Glucovance, Avandamet, Riomet, Fortamet, Actoplus Met, Janumet, Glumetza or Metaglip. The above medications must be held the day of the exam AND 48 hours after the exam.  The purpose of you drinking the oral contrast is to aid in the visualization of your intestinal tract. The contrast solution may cause some diarrhea. Before your exam is started, you will be given a small amount of fluid to drink. Depending on your individual set of symptoms, you may also receive an intravenous injection of x-ray contrast/dye. Plan on being at [facility] for 30 minutes or longer, depending  on the type of exam you are having performed.  This test typically takes 30-45 minutes to complete.  If you have any questions regarding your exam or if you need to reschedule, you may call the CT department at [phone] between the hours of 8:00 am and 5:00 pm, Monday-Friday.  ________________________________________________________________________   Normal BMI (Body Mass Index- based on height and weight) is between 23 and 30. Your BMI today is Body mass index is 23.6 kg/m. [name] Please consider follow up  regarding your BMI with your Primary Care Provider.  Thank you for choosing me and [facility].  Pricilla Riffle. Dagoberto Ligas., MD., Marval Regal

## 2017-12-15 NOTE — Progress Notes (Signed)
     History of Present Illness: This is a 77 year old female with worsening constipation and generalized abdominal pain.  She relates a sudden exacerbation of constipation several weeks ago.  She is now having regular soft bowel movements once or twice daily taking MiraLAX however she has her system generalized abdominal pain and ongoing back pain.  Her abdominal pain does not appear to change with meals or bowel movements.  CMP, CBC, TSH in April were unremarkable.   Colonoscopy 05/2016: - Non-thrombosed external hemorrhoids found on perianal exam. - Diverticulosis in the sigmoid colon and in the transverse colon. - Internal hemorrhoids. - The examination was otherwise normal on direct and retroflexion views. - No specimens collected.  Current Medications, Allergies, Past Medical History, Past Surgical History, Family History and Social History were reviewed in [employer] record.  Physical Exam: General: Well developed, well nourished, no acute distress Head: Normocephalic and atraumatic Eyes:  sclerae anicteric, EOMI Ears: Normal auditory acuity Mouth: No deformity or lesions Lungs: Clear throughout to auscultation Heart: Regular rate and rhythm; no murmurs, rubs or bruits Abdomen: Soft, non tender and non distended. No masses, hepatosplenomegaly or hernias noted. Normal Bowel sounds Rectal: Not done Musculoskeletal: Symmetrical with no gross deformities  Pulses:  Normal pulses noted Extremities: No clubbing, cyanosis, edema or deformities noted Neurological: Alert oriented x 4, grossly nonfocal Psychological:  Alert and cooperative. Normal mood and affect  Assessment and Recommendations:  1. Constipation and generalized abdominal pain.  Continue MiraLAX twice daily.  Begin dicyclomine 10 mg 3 times daily as needed abdominal pain.  Schedule abdominal/pelvic CT. Consider Linzess or Trulance pending CT results. REV timing pending CT result and symptom  response.

## 2017-12-24 ENCOUNTER — Ambulatory Visit (INDEPENDENT_AMBULATORY_CARE_PROVIDER_SITE_OTHER)
Admission: RE | Admit: 2017-12-24 | Discharge: 2017-12-24 | Disposition: A | Discharge status: Home / Self Care | Payer: PPO | Source: Ambulatory Visit | Attending: Gastroenterology | Admitting: Gastroenterology

## 2017-12-24 DIAGNOSIS — R1084 Generalized abdominal pain: Secondary | ICD-10-CM | POA: Diagnosis not present

## 2017-12-24 DIAGNOSIS — K5900 Constipation, unspecified: Secondary | ICD-10-CM | POA: Diagnosis not present

## 2017-12-24 MED ORDER — IOPAMIDOL (ISOVUE-300) INJECTION 61%
100.0000 mL | Freq: Once | INTRAVENOUS | Status: AC | PRN
  Administered 2017-12-24: 100 mL via INTRAVENOUS

## 2017-12-30 ENCOUNTER — Encounter: Payer: Self-pay | Admitting: Internal Medicine

## 2017-12-30 ENCOUNTER — Ambulatory Visit (INDEPENDENT_AMBULATORY_CARE_PROVIDER_SITE_OTHER): Payer: PPO | Admitting: Internal Medicine

## 2017-12-30 VITALS — BP 134/72 | HR 65 | Temp 98.5°F | Resp 16 | Wt 132.0 lb

## 2017-12-30 DIAGNOSIS — R194 Change in bowel habit: Secondary | ICD-10-CM | POA: Diagnosis not present

## 2017-12-30 DIAGNOSIS — S22080A Wedge compression fracture of T11-T12 vertebra, initial encounter for closed fracture: Secondary | ICD-10-CM | POA: Diagnosis not present

## 2017-12-30 DIAGNOSIS — M810 Age-related osteoporosis without current pathological fracture: Secondary | ICD-10-CM | POA: Diagnosis not present

## 2017-12-30 DIAGNOSIS — R918 Other nonspecific abnormal finding of lung field: Secondary | ICD-10-CM

## 2017-12-30 DIAGNOSIS — S32000A Wedge compression fracture of unspecified lumbar vertebra, initial encounter for closed fracture: Secondary | ICD-10-CM | POA: Insufficient documentation

## 2017-12-30 NOTE — Assessment & Plan Note (Signed)
Change in stools for no reason Suspect constipation  Change fiber to a different brand and adjust as needed  Continue stool softeners, miralax - adjust as needed Work on diet changes

## 2017-12-30 NOTE — Assessment & Plan Note (Signed)
Reviewed ct scan Two nodules Never smoker Repeat ct scan in 3 months

## 2017-12-30 NOTE — Patient Instructions (Addendum)
Try switching your fiber to see if that helps with the bowels.    A ct scan of your lungs was ordered - this should be done around 03/27/18 to follow up your lung nodules.   Start walking regularly.

## 2017-12-30 NOTE — Assessment & Plan Note (Signed)
Continue prolia Taking calcium and vitamin d Exercising irregularly - stressed regular exercise Continue same

## 2017-12-30 NOTE — Progress Notes (Signed)
Subjective:    Patient ID: [name] [name], female    DOB: Jan 10, 1941, 77 y.o.   MRN: [id number]  HPI The patient is here for follow up of an abnormal Ct scan that was ordered by GI.  Lung nodules:  5 mm LLL nodule, 9 mm LLL nodule.  A ct scan in 3 months is recommended.  She has never smoked.    Wedge compression deformity of T11 vertebral body:  This is new from 10/03/15.  She is getting prolia injections - her last injection was 12/08/17.  She is taking calcium and vitamin d daily.  She is exercising irregularly.    Change in bowels:  Her ct scan was for acute change in bowels for no reason.  She is taking fiber daily.  She denies changes in meds, supplements, diet.  She has some watery stools and some hard stools.  miralax is works, but his BM's are still not normal.  Her ct scan showed colon full of stool.    12/25/17 CT Abdomen Pelvis W Contrast CLINICAL DATA:  Patient with constipation. Low back pain. Abdominal pain.  EXAM: CT ABDOMEN AND PELVIS WITH CONTRAST  TECHNIQUE: Multidetector CT imaging of the abdomen and pelvis was performed using the standard protocol following bolus administration of intravenous contrast.  CONTRAST:  15mL ISOVUE-300 IOPAMIDOL (ISOVUE-300) INJECTION 61%  COMPARISON:  None.  FINDINGS: Lower chest: Heart is enlarged. No pericardial effusion. There is a 5 mm left lower lobe nodule (image 9; series 3). There is a 9 mm left lower lobe nodule (image 6; series 3). No pleural effusion.  Hepatobiliary: Liver is normal in size and contour. No focal lesion identified. No intrahepatic or extrahepatic biliary ductal dilatation. Status post cholecystectomy.  Pancreas: Unremarkable  Spleen: Unremarkable  Adrenals/Urinary Tract: Adrenal glands are normal. Kidneys enhance symmetrically with contrast. No hydronephrosis. Urinary bladder is unremarkable.  Stomach/Bowel: Stool throughout the colon. No abnormal bowel wall thickening or evidence for bowel  obstruction. Normal morphology of the stomach. No free fluid or free intraperitoneal air.  Vascular/Lymphatic: Infrarenal abdominal aortic ectasia measuring 1.8 cm. No retroperitoneal lymphadenopathy.  Reproductive: Status post hysterectomy.  Other: None.  Musculoskeletal: Wedge compression deformity of the T11 vertebral body, new from radiograph 10/03/2015. Stable wedge compression deformity of the L1 vertebral body.  IMPRESSION: 1. Wedge compression deformity of the T11 vertebral body, new from radiograph 10/03/2015. Recommend correlation for point tenderness. 2. Pulmonary nodules measuring up to 9 mm. Non-contrast chest CT at 3 months is recommended. If the nodules are stable at time of repeat CT, then future CT at 18-24 months (from today's scan) is considered optional for low-risk patients, but is recommended for high-risk patients. This recommendation follows the consensus statement: Guidelines for Management of Incidental Pulmonary Nodules Detected on CT Images: From the Fleischner Society 2017; Radiology 2017; 284:228-243. 3. These results will be called to the ordering clinician or representative by the Radiologist Assistant, and communication documented in the PACS or zVision Dashboard.  Electronically Signed   By: Lovey Newcomer M.D.   On: 12/25/2017 10:25   Medications and allergies reviewed with patient and updated if appropriate.  Patient Active Problem List   Diagnosis Date Noted  . Closed wedge compression fracture of T11 vertebra ([place]) 12/30/2017  . Lung nodules 12/30/2017  . Hypothyroidism 11/01/2016  . Right groin pain 04/30/2016  . Change in bowel habits 04/30/2016  . Prediabetes 11/01/2015  . Spinal stenosis of lumbar region 10/03/2015  . DDD (degenerative disc disease), lumbar   .  DJD (degenerative joint disease) of knee   . Hypertension 11/25/2010  . History of colon polyps 05/14/2009  . OBESITY 05/14/2009  . G E R D 08/17/2007  . Hyperlipidemia  08/16/2007  . HIATAL HERNIA 08/16/2007  . DIVERTICULAR DISEASE 08/16/2007  . IBS 08/16/2007  . Osteoporosis 08/16/2007    Current Outpatient Medications on File Prior to Visit  Medication Sig Dispense Refill  . acetic acid-hydrocortisone (VOSOL-HC) otic solution Place 3 drops into both ears 2 (two) times daily as needed. 30 mL 1  . aspirin 81 MG EC tablet Take 81 mg by mouth daily. Ecotrin    . Biotin 5 MG CAPS Taking daily  0  . bisoprolol-hydrochlorothiazide (ZIAC) 5-6.25 MG tablet Take 1 tablet by mouth daily. 90 tablet 3  . calcium-vitamin D (OSCAL WITH D) 500-200 MG-UNIT per tablet Take 1 tablet by mouth 2 (two) times daily.     . clobetasol ointment (TEMOVATE) 7.12 % Apply 1 application topically 2 (two) times daily.    [name] denosumab (PROLIA) 60 MG/ML SOLN injection Inject 60 mg into the skin every 6 (six) months. Administer in upper arm, thigh, or abdomen    . dicyclomine (BENTYL) 10 MG capsule Take 1 capsule (10 mg total) by mouth 3 (three) times daily before meals. 90 capsule 11  . ibuprofen (ADVIL,MOTRIN) 200 MG tablet Take 600 mg by mouth 3 (three) times daily. With meals    . levothyroxine (SYNTHROID, LEVOTHROID) 25 MCG tablet TAKE 1 CAPSULE BY MOUTH DAILY BEFORE BREAKFAST. 90 tablet 3  . Multiple Vitamins-Minerals (CENTRUM SILVER PO) Take by mouth daily.      [name] nystatin cream (MYCOSTATIN) Apply 1 application topically 2 (two) times daily as needed.    [name] omeprazole (PRILOSEC) 20 MG capsule Take 1 capsule (20 mg total) by mouth daily. 90 capsule 3  . phenylephrine-shark liver oil-mineral oil-petrolatum (PREPARATION H) 0.25-3-14-71.9 % rectal ointment Place 1 application rectally 2 (two) times daily as needed for hemorrhoids.    . polyethylene glycol (MIRALAX / GLYCOLAX) packet Take 17 g by mouth daily. 2 capfuls daily    . pravastatin (PRAVACHOL) 10 MG tablet Take 1 tablet (10 mg total) by mouth daily. 90 tablet 1  . ranitidine (ZANTAC) 150 MG tablet TAKE 1 TABLET BY MOUTH TWICE A  DAY 180 tablet 3  . Wheat Dextrin (BENEFIBER) POWD Twice daily  0   No current facility-administered medications on file prior to visit.     Past Medical History:  Diagnosis Date  . Benign neoplasm of colon   . Cataract    bilateral,had surgery  . DDD (degenerative disc disease), lumbar    MRI w. [facility] 05/2015, s/p ESI x 1 w/ improvement  . Diaphragmatic hernia without mention of obstruction or gangrene   . Diverticulosis of colon (without mention of hemorrhage)   . DJD (degenerative joint disease) of knee    L>R, IA cortisone by [facility] in L 03/2015  . Esophageal reflux   . Gallstones   . History of chicken pox   . Hyperlipidemia   . Hypertension   . Irritable bowel syndrome   . Migraines   . Obesity, unspecified   . Osteoporosis, unspecified   . Tachycardia   . Unspecified hemorrhoids with other complication     Past Surgical History:  Procedure Laterality Date  . APPENDECTOMY  1979  . CHOLECYSTECTOMY  1989  . COLONOSCOPY    . POLYPECTOMY    . [place]  History   Socioeconomic History  . Marital status: Married    Spouse name: Not on file  . Number of children: Not on file  . Years of education: Not on file  . Highest education level: Not on file  Occupational History  . Not on file  Social Needs  . Financial resource strain: Not hard at all  . Food insecurity:    Worry: Never true    Inability: Never true  . Transportation needs:    Medical: No    Non-medical: No  Tobacco Use  . Smoking status: Never Smoker  . Smokeless tobacco: Never Used  Substance and Sexual Activity  . Alcohol use: No    Alcohol/week: 0.0 oz  . Drug use: No  . Sexual activity: Not Currently  Lifestyle  . Physical activity:    Days per week: 3 days    Minutes per session: 60 min  . Stress: To some extent  Relationships  . Social connections:    Talks on phone: More than three times a week    Gets together: More than three times a week     Attends religious service: More than 4 times per year    Active member of club or organization: Yes    Attends meetings of clubs or organizations: More than 4 times per year    Relationship status: Married  Other Topics Concern  . Not on file  Social History Narrative   Exercise: no regular exercise    Family History  Problem Relation Age of Onset  . Heart disease Sister        x 2  . Heart disease Brother   . Stroke Mother   . Colon cancer Sister   . Stroke Sister 80    Review of Systems  Constitutional: Negative for chills and fever.  Respiratory: Negative for cough, shortness of breath and wheezing.   Cardiovascular: Negative for chest pain.  Musculoskeletal: Positive for back pain.       Objective:   Vitals:   12/30/17 1255  BP: 134/72  Pulse: 65  Resp: 16  Temp: 98.5 F (36.9 C)  SpO2: 98%   BP Readings from Last 3 Encounters:  12/30/17 134/72  12/15/17 126/80  12/01/17 134/78   Wt Readings from Last 3 Encounters:  12/30/17 132 lb (59.9 kg)  12/15/17 133 lb 4 oz (60.4 kg)  12/01/17 133 lb (60.3 kg)   Body mass index is 23.38 kg/m.   Physical Exam    Constitutional: Appears well-developed and well-nourished. No distress.  HENT:  Head: Normocephalic and atraumatic.  Cardiovascular: Normal rate, regular rhythm and normal heart sounds.   No murmur heard..  No edema Pulmonary/Chest: Effort normal and breath sounds normal. No respiratory distress. No has no wheezes. No rales.  Skin: Skin is warm and dry. Not diaphoretic.  Psychiatric: Normal mood and affect. Behavior is normal.     Assessment & Plan:    See Problem List for Assessment and Plan of chronic medical problems.

## 2017-12-30 NOTE — Assessment & Plan Note (Signed)
She was unaware of this Has some lower back pain - ? Related Continue treatment for OP Increase exercise

## 2017-12-30 NOTE — Assessment & Plan Note (Signed)
2 LLL nodules Repeat ct scan in 3 months

## 2018-01-03 ENCOUNTER — Other Ambulatory Visit: Payer: Self-pay | Admitting: Internal Medicine

## 2018-01-06 ENCOUNTER — Inpatient Hospital Stay: Admission: RE | Admit: 2018-01-06 | Payer: PPO | Source: Ambulatory Visit

## 2018-01-07 ENCOUNTER — Ambulatory Visit (INDEPENDENT_AMBULATORY_CARE_PROVIDER_SITE_OTHER)
Admission: RE | Admit: 2018-01-07 | Discharge: 2018-01-07 | Disposition: A | Discharge status: Home / Self Care | Payer: PPO | Source: Ambulatory Visit | Attending: Internal Medicine | Admitting: Internal Medicine

## 2018-01-07 DIAGNOSIS — E2839 Other primary ovarian failure: Secondary | ICD-10-CM

## 2018-03-03 NOTE — Telephone Encounter (Addendum)
Relation to pt: self Call back number: [phone]  Reason for call:  Patient states she has all necessary paperwork to receive assistance / reimbursement for the prolia, patient states she would like to speak with Jonelle Sidle directly, please advise

## 2018-03-03 NOTE — Telephone Encounter (Signed)
Patient to bring forms needed to [place] office for tamara,RN---I will fax over completed forms for reimbursement

## 2018-03-30 NOTE — Telephone Encounter (Signed)
Patient called asking to speak with Jonelle Sidle. I called patient back due to Jonelle Sidle being out of the office this week. She said that she has forms that need to be sent in to [facility].. Patient states she will bring them in tomorrow. I advised her to ask for me and that I would try to assist her with this.

## 2018-03-31 NOTE — Telephone Encounter (Signed)
Patient brought papers into the office. She explained to me that when she was here on 12/01/2017 she paid her copay for Prolia but did not end up getting it while she was here. Because the charges were not entered for the date that she made her payment it has caused some confusion with [employer]. The patient has been back and forth with [facility] billing and [place] and thinks that this will help to clear everything up for her. She did end up getting the injection on 12/08/17 and charges were entered and re-routed.  I have faxed the paperwork to [facility] and have it attached with her Prolia card.

## 2018-04-20 ENCOUNTER — Ambulatory Visit (INDEPENDENT_AMBULATORY_CARE_PROVIDER_SITE_OTHER)
Admission: RE | Admit: 2018-04-20 | Discharge: 2018-04-20 | Disposition: A | Discharge status: Home / Self Care | Payer: PPO | Source: Ambulatory Visit | Attending: Internal Medicine | Admitting: Internal Medicine

## 2018-04-20 DIAGNOSIS — R918 Other nonspecific abnormal finding of lung field: Secondary | ICD-10-CM | POA: Diagnosis not present

## 2018-04-28 ENCOUNTER — Other Ambulatory Visit: Payer: Self-pay | Admitting: Internal Medicine

## 2018-05-03 NOTE — Progress Notes (Signed)
Subjective:    Patient ID: [name] [name], female    DOB: [birth year], 77 y.o.   MRN: [id number]  HPI The patient is here for follow up.  Prediabetes:  She is compliant with a low sugar/carbohydrate diet.  She is active, but not exercising regularly.  Hypertension: She is taking her medication daily. She is compliant with a low sodium diet.  She denies chest pain, palpitations, edema, shortness of breath and regular headaches. She is active, but not exercising regularly.    Hyperlipidemia: She is taking her medication daily. She is compliant with a low fat/cholesterol diet. She is not exercising regularly. She denies myalgias.   Hypothyroidism:  She is taking her medication daily.  She denies any recent changes in energy or weight that are unexplained.   GERD:  She is taking her medication daily as prescribed.  She denies any GERD symptoms and feels her GERD is well controlled.   Osteoporosis:  She is getting prolia injections Q 6 months.  Her last injection was 12/08/17.  Medications and allergies reviewed with patient and updated if appropriate.  Patient Active Problem List   Diagnosis Date Noted  . Closed wedge compression fracture of T11 vertebra ([place]) 12/30/2017  . Lung nodules 12/30/2017  . Abnormal findings on diagnostic imaging of lung 12/30/2017  . Hypothyroidism 11/01/2016  . Right groin pain 04/30/2016  . Change in bowel habits 04/30/2016  . Prediabetes 11/01/2015  . Spinal stenosis of lumbar region 10/03/2015  . DDD (degenerative disc disease), lumbar   . DJD (degenerative joint disease) of knee   . Hypertension 11/25/2010  . History of colon polyps 05/14/2009  . OBESITY 05/14/2009  . G E R D 08/17/2007  . Hyperlipidemia 08/16/2007  . HIATAL HERNIA 08/16/2007  . DIVERTICULAR DISEASE 08/16/2007  . IBS 08/16/2007  . Osteoporosis 08/16/2007    Current Outpatient Medications on File Prior to Visit  Medication Sig Dispense Refill  . acetic acid-hydrocortisone  (VOSOL-HC) otic solution Place 3 drops into both ears 2 (two) times daily as needed. 30 mL 1  . aspirin 81 MG EC tablet Take 81 mg by mouth daily. Ecotrin    . Biotin 5 MG CAPS Taking daily  0  . bisoprolol-hydrochlorothiazide (ZIAC) 5-6.25 MG tablet TAKE 1 TABLET BY MOUTH EVERY DAY 90 tablet 3  . calcium-vitamin D (OSCAL WITH D) 500-200 MG-UNIT per tablet Take 1 tablet by mouth 2 (two) times daily.     . clobetasol ointment (TEMOVATE) 2.35 % Apply 1 application topically 2 (two) times daily.    [name] denosumab (PROLIA) 60 MG/ML SOLN injection Inject 60 mg into the skin every 6 (six) months. Administer in upper arm, thigh, or abdomen    . dicyclomine (BENTYL) 10 MG capsule Take 1 capsule (10 mg total) by mouth 3 (three) times daily before meals. 90 capsule 11  . ibuprofen (ADVIL,MOTRIN) 200 MG tablet Take 600 mg by mouth 3 (three) times daily. With meals    . levothyroxine (SYNTHROID, LEVOTHROID) 25 MCG tablet TAKE 1 CAPSULE BY MOUTH DAILY BEFORE BREAKFAST. 90 tablet 3  . Multiple Vitamins-Minerals (CENTRUM SILVER PO) Take by mouth daily.      [name] nystatin cream (MYCOSTATIN) Apply 1 application topically 2 (two) times daily as needed.    [name] omeprazole (PRILOSEC) 20 MG capsule Take 1 capsule (20 mg total) by mouth daily. 90 capsule 3  . phenylephrine-shark liver oil-mineral oil-petrolatum (PREPARATION H) 0.25-3-14-71.9 % rectal ointment Place 1 application rectally 2 (two) times daily as  needed for hemorrhoids.    . polyethylene glycol (MIRALAX / GLYCOLAX) packet Take 17 g by mouth daily. 2 capfuls daily    . pravastatin (PRAVACHOL) 10 MG tablet TAKE 1 TABLET BY MOUTH EVERY DAY 90 tablet 1  . ranitidine (ZANTAC) 150 MG tablet TAKE 1 TABLET BY MOUTH TWICE A DAY 180 tablet 3  . Wheat Dextrin (BENEFIBER) POWD Twice daily  0   No current facility-administered medications on file prior to visit.     Past Medical History:  Diagnosis Date  . Benign neoplasm of colon   . Cataract    bilateral,had surgery    . DDD (degenerative disc disease), lumbar    MRI w. [facility] 05/2015, s/p ESI x 1 w/ improvement  . Diaphragmatic hernia without mention of obstruction or gangrene   . Diverticulosis of colon (without mention of hemorrhage)   . DJD (degenerative joint disease) of knee    L>R, IA cortisone by [facility] in L 03/2015  . Esophageal reflux   . Gallstones   . History of chicken pox   . Hyperlipidemia   . Hypertension   . Irritable bowel syndrome   . Migraines   . Obesity, unspecified   . Osteoporosis, unspecified   . Tachycardia   . Unspecified hemorrhoids with other complication     Past Surgical History:  Procedure Laterality Date  . APPENDECTOMY  1979  . CHOLECYSTECTOMY  1989  . COLONOSCOPY    . POLYPECTOMY    . VAGINAL HYSTERECTOMY  1996    Social History   Socioeconomic History  . Marital status: Married    Spouse name: Not on file  . Number of children: Not on file  . Years of education: Not on file  . Highest education level: Not on file  Occupational History  . Not on file  Social Needs  . Financial resource strain: Not hard at all  . Food insecurity:    Worry: Never true    Inability: Never true  . Transportation needs:    Medical: No    Non-medical: No  Tobacco Use  . Smoking status: Never Smoker  . Smokeless tobacco: Never Used  Substance and Sexual Activity  . Alcohol use: No    Alcohol/week: 0.0 standard drinks  . Drug use: No  . Sexual activity: Not Currently  Lifestyle  . Physical activity:    Days per week: 3 days    Minutes per session: 60 min  . Stress: To some extent  Relationships  . Social connections:    Talks on phone: More than three times a week    Gets together: More than three times a week    Attends religious service: More than 4 times per year    Active member of club or organization: Yes    Attends meetings of clubs or organizations: More than 4 times per year    Relationship status: Married  Other Topics Concern  . Not on  file  Social History Narrative   Exercise: no regular exercise    Family History  Problem Relation Age of Onset  . Heart disease Sister        x 2  . Heart disease Brother   . Stroke Mother   . Colon cancer Sister   . Stroke Sister 83    Review of Systems  Constitutional: Negative for chills and fever.  Respiratory: Negative for cough, shortness of breath and wheezing.   Cardiovascular: Negative for chest pain, palpitations (occ heart  pounding) and leg swelling.  Neurological: Negative for light-headedness and headaches.       Objective:   Vitals:   05/04/18 0909  BP: (!) 148/92  Pulse: 61  Resp: 16  Temp: 98 F (36.7 C)  SpO2: 97%   BP Readings from Last 3 Encounters:  05/04/18 (!) 148/92  12/30/17 134/72  12/15/17 126/80   Wt Readings from Last 3 Encounters:  05/04/18 134 lb (60.8 kg)  12/30/17 132 lb (59.9 kg)  12/15/17 133 lb 4 oz (60.4 kg)   Body mass index is 23.74 kg/m.   Physical Exam    Constitutional: Appears well-developed and well-nourished. No distress.  HENT:  Head: Normocephalic and atraumatic.  Neck: Neck supple. No tracheal deviation present. No thyromegaly present.  No cervical lymphadenopathy Cardiovascular: Normal rate, regular rhythm and normal heart sounds.   No murmur heard. No carotid bruit .  No edema Pulmonary/Chest: Effort normal and breath sounds normal. No respiratory distress. No has no wheezes. No rales.  Skin: Skin is warm and dry. Not diaphoretic.  Psychiatric: Normal mood and affect. Behavior is normal.      Assessment & Plan:    See Problem List for Assessment and Plan of chronic medical problems.

## 2018-05-03 NOTE — Patient Instructions (Addendum)
  Tests ordered today. Your results will be released to MyChart (or called to you) after review, usually within 72hours after test completion. If any changes need to be made, you will be notified at that same time.  Flu immunization administered today.    Medications reviewed and updated.  Changes include :   none    Please followup in 6 months   

## 2018-05-04 ENCOUNTER — Ambulatory Visit (INDEPENDENT_AMBULATORY_CARE_PROVIDER_SITE_OTHER): Payer: PPO | Admitting: Internal Medicine

## 2018-05-04 ENCOUNTER — Encounter: Payer: Self-pay | Admitting: Internal Medicine

## 2018-05-04 VITALS — BP 148/92 | HR 61 | Temp 98.0°F | Resp 16 | Ht 63.0 in | Wt 134.0 lb

## 2018-05-04 DIAGNOSIS — E063 Autoimmune thyroiditis: Secondary | ICD-10-CM

## 2018-05-04 DIAGNOSIS — Z23 Encounter for immunization: Secondary | ICD-10-CM | POA: Diagnosis not present

## 2018-05-04 DIAGNOSIS — E038 Other specified hypothyroidism: Secondary | ICD-10-CM | POA: Diagnosis not present

## 2018-05-04 DIAGNOSIS — K219 Gastro-esophageal reflux disease without esophagitis: Secondary | ICD-10-CM

## 2018-05-04 DIAGNOSIS — M810 Age-related osteoporosis without current pathological fracture: Secondary | ICD-10-CM

## 2018-05-04 DIAGNOSIS — R918 Other nonspecific abnormal finding of lung field: Secondary | ICD-10-CM | POA: Diagnosis not present

## 2018-05-04 DIAGNOSIS — R7303 Prediabetes: Secondary | ICD-10-CM

## 2018-05-04 DIAGNOSIS — E7849 Other hyperlipidemia: Secondary | ICD-10-CM | POA: Diagnosis not present

## 2018-05-04 DIAGNOSIS — I10 Essential (primary) hypertension: Secondary | ICD-10-CM | POA: Diagnosis not present

## 2018-05-04 NOTE — Assessment & Plan Note (Signed)
Check lipid panel, cmp Continue daily statin Regular exercise and healthy diet encouraged  

## 2018-05-04 NOTE — Assessment & Plan Note (Signed)
BP elevated here today, but typically better controlled Current regimen effective and well tolerated Continue current medications at current doses cmp

## 2018-05-04 NOTE — Assessment & Plan Note (Signed)
dexa up to date Active, no regular exercise Taking calcium and vitamin d Continue prolia - next injection due 05/2018

## 2018-05-04 NOTE — Assessment & Plan Note (Signed)
Check a1c Low sugar / carb diet Stressed regular exercise   

## 2018-05-04 NOTE — Assessment & Plan Note (Signed)
Controlled - discussed that some zantac have been recalled - she will likely replace it with pepcid

## 2018-05-04 NOTE — Assessment & Plan Note (Signed)
Clinically euthyroid Check tsh  Titrate med dose if needed  

## 2018-06-09 ENCOUNTER — Telehealth: Payer: Self-pay

## 2018-06-09 NOTE — Telephone Encounter (Signed)
Advised patient that her next prolia injection is due either on/after Jun 11, 2018---but needs to schedule before the end of this year (2019)---[employer] approval [phone] good thru April/2020---patient will have estimated $250 copay, then we will need to fill out paperwork to get reimbursement for copay back to patient using [employer] benefits (Jayci Ellefson has paperwork pending)---can schedule nurse visit for prolia injection---can talk with Raden Byington,RN at [place] office if any further questions

## 2018-06-10 DIAGNOSIS — R351 Nocturia: Secondary | ICD-10-CM | POA: Diagnosis not present

## 2018-06-10 DIAGNOSIS — R3915 Urgency of urination: Secondary | ICD-10-CM | POA: Diagnosis not present

## 2018-06-10 DIAGNOSIS — N390 Urinary tract infection, site not specified: Secondary | ICD-10-CM | POA: Diagnosis not present

## 2018-06-10 DIAGNOSIS — Z6826 Body mass index (BMI) 26.0-26.9, adult: Secondary | ICD-10-CM | POA: Diagnosis not present

## 2018-06-10 DIAGNOSIS — Z01419 Encounter for gynecological examination (general) (routine) without abnormal findings: Secondary | ICD-10-CM | POA: Diagnosis not present

## 2018-07-01 ENCOUNTER — Ambulatory Visit (INDEPENDENT_AMBULATORY_CARE_PROVIDER_SITE_OTHER): Payer: PPO

## 2018-07-01 DIAGNOSIS — M810 Age-related osteoporosis without current pathological fracture: Secondary | ICD-10-CM

## 2018-07-01 MED ORDER — DENOSUMAB 60 MG/ML ~~LOC~~ SOSY
60.0000 mg | PREFILLED_SYRINGE | Freq: Once | SUBCUTANEOUS | Status: AC
  Administered 2018-07-01: 60 mg via SUBCUTANEOUS

## 2018-07-01 NOTE — Progress Notes (Signed)
prolia Injection given.   Lander Eslick J Aleksis Jiggetts, MD  

## 2018-08-31 ENCOUNTER — Telehealth: Payer: Self-pay

## 2018-08-31 NOTE — Telephone Encounter (Signed)
Copied from [place] [phone]. Topic: General - Inquiry >> Aug 30, 2018  4:29 PM Vernona Rieger wrote: Reason for CRM: Patient would like Jonelle Sidle to call her about her proila. She has her information for it that needs to be filled out  Patient brought all necessary forms for prolia reimbursement to me, I have faxed that information to [employer] to request reimbursement---faxed copies in patient's folder in prolia cabinet---can talk with tamara if any further questions

## 2018-09-17 NOTE — Telephone Encounter (Signed)
Pt called to thank Jonelle Sidle for helping her with forms below. She received her reimbursement on the prolia.

## 2018-11-04 ENCOUNTER — Ambulatory Visit: Payer: PPO | Admitting: Internal Medicine

## 2018-11-11 ENCOUNTER — Ambulatory Visit: Payer: PPO | Admitting: Internal Medicine

## 2018-11-23 ENCOUNTER — Other Ambulatory Visit: Payer: Self-pay | Admitting: Obstetrics and Gynecology

## 2018-11-23 DIAGNOSIS — Z1231 Encounter for screening mammogram for malignant neoplasm of breast: Secondary | ICD-10-CM

## 2018-12-08 ENCOUNTER — Ambulatory Visit (INDEPENDENT_AMBULATORY_CARE_PROVIDER_SITE_OTHER): Payer: PPO | Admitting: *Deleted

## 2018-12-08 DIAGNOSIS — Z0000 Encounter for general adult medical examination without abnormal findings: Secondary | ICD-10-CM

## 2018-12-08 NOTE — Patient Instructions (Addendum)
Continue doing brain stimulating activities (puzzles, reading, adult coloring books, staying active) to keep memory sharp.   Continue to eat heart healthy diet (full of fruits, vegetables, whole grains, lean protein, water--limit salt, fat, and sugar intake) and increase physical activity as tolerated.   Ms. [name] [name] , Thank you for taking time to come for your Medicare Wellness Visit. I appreciate your ongoing commitment to your health goals. Please review the following plan we discussed and let me know if I can assist you in the future.   These are the goals we discussed: Goals    . Maintain current health status     Continue to be active, eat healthy, enjoy family and enjoy life.    . Patient Stated     Go shopping and use all of the gift cards that I have. Continue to take time for myself daily to read, relax, look over catalogs and sale papers.    . Patient Stated     I want to get everything in my home in order by organizing a little bit at a time.    . Weight < 200 lb (90.719 kg)     Wants to lose 10lbs; would like to come off of some medication. Would like to stop taking chol. Educate appropriately       This is a list of the screening recommended for you and due dates:  Health Maintenance  Topic Date Due  . Flu Shot  02/26/2019  . DEXA scan (bone density measurement)  01/08/2020  . Colon Cancer Screening  06/24/2021  . Tetanus Vaccine  10/08/2023  . Pneumonia vaccines  Completed    Preventive Care 50 Years and Older, Female Preventive care refers to lifestyle choices and visits with your health care provider that can promote health and wellness. What does preventive care include?  A yearly physical exam. This is also called an annual well check.  Dental exams once or twice a year.  Routine eye exams. Ask your health care provider how often you should have your eyes checked.  Personal lifestyle choices, including: ? Daily care of your teeth and gums. ? Regular  physical activity. ? Eating a healthy diet. ? Avoiding tobacco and drug use. ? Limiting alcohol use. ? Practicing safe sex. ? Taking low-dose aspirin every day. ? Taking vitamin and mineral supplements as recommended by your health care provider. What happens during an annual well check? The services and screenings done by your health care provider during your annual well check will depend on your age, overall health, lifestyle risk factors, and family history of disease. Counseling Your health care provider may ask you questions about your:  Alcohol use.  Tobacco use.  Drug use.  Emotional well-being.  Home and relationship well-being.  Sexual activity.  Eating habits.  History of falls.  Memory and ability to understand (cognition).  Work and work Statistician.  Reproductive health.  Screening You may have the following tests or measurements:  Height, weight, and BMI.  Blood pressure.  Lipid and cholesterol levels. These may be checked every 5 years, or more frequently if you are over 60 years old.  Skin check.  Lung cancer screening. You may have this screening every year starting at age 89 if you have a 30-pack-year history of smoking and currently smoke or have quit within the past 15 years.  Colorectal cancer screening. All adults should have this screening starting at age 64 and continuing until age 27. You will have tests every  1-10 years, depending on your results and the type of screening test. People at increased risk should start screening at an earlier age. Screening tests may include: ? Guaiac-based fecal occult blood testing. ? Fecal immunochemical test (FIT). ? Stool DNA test. ? Virtual colonoscopy. ? Sigmoidoscopy. During this test, a flexible tube with a tiny camera (sigmoidoscope) is used to examine your rectum and lower colon. The sigmoidoscope is inserted through your anus into your rectum and lower colon. ? Colonoscopy. During this test, a  long, thin, flexible tube with a tiny camera (colonoscope) is used to examine your entire colon and rectum.  Hepatitis C blood test.  Hepatitis B blood test.  Sexually transmitted disease (STD) testing.  Diabetes screening. This is done by checking your blood sugar (glucose) after you have not eaten for a while (fasting). You may have this done every 1-3 years.  Bone density scan. This is done to screen for osteoporosis. You may have this done starting at age 24.  Mammogram. This may be done every 1-2 years. Talk to your health care provider about how often you should have regular mammograms. Talk with your health care provider about your test results, treatment options, and if necessary, the need for more tests. Vaccines Your health care provider may recommend certain vaccines, such as:  Influenza vaccine. This is recommended every year.  Tetanus, diphtheria, and acellular pertussis (Tdap, Td) vaccine. You may need a Td booster every 10 years.  Varicella vaccine. You may need this if you have not been vaccinated.  Zoster vaccine. You may need this after age 61.  Measles, mumps, and rubella (MMR) vaccine. You may need at least one dose of MMR if you were born in 1957 or later. You may also need a second dose.  Pneumococcal 13-valent conjugate (PCV13) vaccine. One dose is recommended after age 34.  Pneumococcal polysaccharide (PPSV23) vaccine. One dose is recommended after age 61.  Meningococcal vaccine. You may need this if you have certain conditions.  Hepatitis A vaccine. You may need this if you have certain conditions or if you travel or work in places where you may be exposed to hepatitis A.  Hepatitis B vaccine. You may need this if you have certain conditions or if you travel or work in places where you may be exposed to hepatitis B.  Haemophilus influenzae type b (Hib) vaccine. You may need this if you have certain conditions. Talk to your health care provider about  which screenings and vaccines you need and how often you need them. This information is not intended to replace advice given to you by your health care provider. Make sure you discuss any questions you have with your health care provider. Document Released: 08/10/2015 Document Revised: 09/03/2017 Document Reviewed: 05/15/2015 Elsevier Interactive Patient Education  2019 [employer].

## 2018-12-08 NOTE — Progress Notes (Addendum)
Subjective:   [name] [name] is a 78 y.o. female who presents for Medicare Annual (Subsequent) preventive examination. I connected with patient by a telephone and verified that I am speaking with the correct person using two identifiers. Patient stated full name and DOB. Patient gave permission to continue with telephonic visit. Patient's location was at home and Nurse's location was at [place] office.   Review of Systems:  No ROS.  Medicare Wellness Virtual Visit.  Visual/audio telehealth visit, UTA vital signs.   See social history for additional risk factors.  Cardiac Risk Factors include: advanced age (>53men, >2 women);dyslipidemia;hypertension Sleep patterns: feels rested on waking, gets up 1-2 times nightly to void and sleeps 7 hours nightly.    Home Safety/Smoke Alarms: Feels safe in home. Smoke alarms in place.  Living environment; residence and Firearm Safety: [place], can live on one level. Lives with husband, no needs for DME, good support system Seat Belt Safety/Bike Helmet: Wears seat belt.     Objective:     Vitals: There were no vitals taken for this visit.  There is no height or weight on file to calculate BMI.  Advanced Directives 12/08/2018 12/01/2017 11/26/2016 10/31/2014  Does Patient Have a Medical Advance Directive? Yes Yes Yes No  Type of Paramedic of [place];Living will [place];Living will [place];Living will -  Copy of [place] in Chart? No - copy requested No - copy requested No - copy requested -  Would patient like information on creating a medical advance directive? - - - Yes - Educational materials given    Tobacco Social History   Tobacco Use  Smoking Status Never Smoker  Smokeless Tobacco Never Used     Counseling given: Not Answered     Past Medical History:  Diagnosis Date  . Benign neoplasm of colon   . Cataract    bilateral,had surgery  . DDD  (degenerative disc disease), lumbar    MRI w. [facility] 05/2015, s/p ESI x 1 w/ improvement  . Diaphragmatic hernia without mention of obstruction or gangrene   . Diverticulosis of colon (without mention of hemorrhage)   . DJD (degenerative joint disease) of knee    L>R, IA cortisone by [facility] in L 03/2015  . Esophageal reflux   . Gallstones   . History of chicken pox   . Hyperlipidemia   . Hypertension   . Irritable bowel syndrome   . Migraines   . Obesity, unspecified   . Osteoporosis, unspecified   . Tachycardia   . Unspecified hemorrhoids with other complication    Past Surgical History:  Procedure Laterality Date  . APPENDECTOMY  1979  . CHOLECYSTECTOMY  1989  . COLONOSCOPY    . POLYPECTOMY    . VAGINAL HYSTERECTOMY  1996   Family History  Problem Relation Age of Onset  . Heart disease Sister        x 2  . Heart disease Brother   . Stroke Mother   . Colon cancer Sister   . Stroke Sister 58   Social History   Socioeconomic History  . Marital status: Married    Spouse name: Not on file  . Number of children: 2  . Years of education: Not on file  . Highest education level: Not on file  Occupational History  . Occupation: retired  Scientific laboratory technician  . Financial resource strain: Not hard at all  . Food insecurity:  Worry: Never true    Inability: Never true  . Transportation needs:    Medical: No    Non-medical: No  Tobacco Use  . Smoking status: Never Smoker  . Smokeless tobacco: Never Used  Substance and Sexual Activity  . Alcohol use: No    Alcohol/week: 0.0 standard drinks  . Drug use: No  . Sexual activity: Not Currently  Lifestyle  . Physical activity:    Days per week: 0 days    Minutes per session: Not on file  . Stress: Only a little  Relationships  . Social connections:    Talks on phone: More than three times a week    Gets together: More than three times a week    Attends religious service: More than 4 times per year    Active member  of club or organization: Yes    Attends meetings of clubs or organizations: More than 4 times per year    Relationship status: Married  Other Topics Concern  . Not on file  Social History Narrative   Exercise: no regular exercise    Outpatient Encounter Medications as of 12/08/2018  Medication Sig  . acetic acid-hydrocortisone (VOSOL-HC) otic solution Place 3 drops into both ears 2 (two) times daily as needed.  [name] aspirin 81 MG EC tablet Take 81 mg by mouth daily. Ecotrin  . Biotin 5 MG CAPS Taking daily  . bisoprolol-hydrochlorothiazide (ZIAC) 5-6.25 MG tablet TAKE 1 TABLET BY MOUTH EVERY DAY  . calcium-vitamin D (OSCAL WITH D) 500-200 MG-UNIT per tablet Take 1 tablet by mouth 2 (two) times daily.   . clobetasol ointment (TEMOVATE) 3.23 % Apply 1 application topically 2 (two) times daily.  [name] denosumab (PROLIA) 60 MG/ML SOLN injection Inject 60 mg into the skin every 6 (six) months. Administer in upper arm, thigh, or abdomen  . ibuprofen (ADVIL,MOTRIN) 200 MG tablet Take 600 mg by mouth 3 (three) times daily. With meals  . levothyroxine (SYNTHROID, LEVOTHROID) 25 MCG tablet TAKE 1 CAPSULE BY MOUTH DAILY BEFORE BREAKFAST.  . Multiple Vitamins-Minerals (CENTRUM SILVER PO) Take by mouth daily.    [name] omeprazole (PRILOSEC) 20 MG capsule Take 1 capsule (20 mg total) by mouth daily.  . phenylephrine-shark liver oil-mineral oil-petrolatum (PREPARATION H) 0.25-3-14-71.9 % rectal ointment Place 1 application rectally 2 (two) times daily as needed for hemorrhoids.  . polyethylene glycol (MIRALAX / GLYCOLAX) packet Take 17 g by mouth daily. 2 capfuls daily  . pravastatin (PRAVACHOL) 10 MG tablet TAKE 1 TABLET BY MOUTH EVERY DAY  . Wheat Dextrin (BENEFIBER) POWD Twice daily  . [DISCONTINUED] dicyclomine (BENTYL) 10 MG capsule Take 1 capsule (10 mg total) by mouth 3 (three) times daily before meals. (Patient not taking: Reported on 12/08/2018)  . [DISCONTINUED] nystatin cream (MYCOSTATIN) Apply 1  application topically 2 (two) times daily as needed.  . [DISCONTINUED] ranitidine (ZANTAC) 150 MG tablet TAKE 1 TABLET BY MOUTH TWICE A DAY (Patient not taking: Reported on 12/08/2018)   No facility-administered encounter medications on file as of 12/08/2018.     Activities of Daily Living In your present state of health, do you have any difficulty performing the following activities: 12/08/2018  Hearing? N  Vision? N  Difficulty concentrating or making decisions? N  Walking or climbing stairs? N  Dressing or bathing? N  Doing errands, shopping? N  Preparing Food and eating ? N  Using the Toilet? N  In the past six months, have you accidently leaked urine? N  Do you have  problems with loss of bowel control? N  Managing your Medications? N  Managing your Finances? N  Housekeeping or managing your Housekeeping? N  Some recent data might be hidden    Patient Care Team: Binnie Rail, MD as PCP - General (Internal Medicine) Ladene Artist, MD (Gastroenterology) Molli Posey, MD (Obstetrics and Gynecology) Luberta Mutter, MD (Ophthalmology) Josue Hector, MD (Cardiology) Netta Cedars, MD (Orthopedic Surgery) Suella Broad, MD (Physical Medicine and Rehabilitation)    Assessment:   This is a routine wellness examination for [facility]. Physical assessment deferred to PCP.  Exercise Activities and Dietary recommendations Current Exercise Habits: The patient does not participate in regular exercise at present, Exercise limited by: None identified Diet (meal preparation, eat out, water intake, caffeinated beverages, dairy products, fruits and vegetables): in general, a "healthy" diet  , well balanced   Reviewed heart healthy diet. Encouraged patient to increase daily water and healthy fluid intake.  Goals    . Maintain current health status     Continue to be active, eat healthy, enjoy family and enjoy life.    . Patient Stated     I want to get everything in my home in  order by organizing a little bit at a time.       Fall Risk Fall Risk  12/08/2018 12/01/2017 11/26/2016 04/30/2016 10/31/2014  Falls in the past year? 0 No Yes No No  Number falls in past yr: 0 - 1 - -  Injury with Fall? - - No - -  Risk for fall due to : - - - - (No Data)  Risk for fall due to: Comment - - - - States she is careful; aware of fall risk  Follow up - - Education provided;Falls prevention discussed - -   Depression Screen PHQ 2/9 Scores 12/08/2018 12/01/2017 11/26/2016 04/30/2016  PHQ - 2 Score 1 1 0 0  PHQ- 9 Score 1 1 - -     Cognitive Function MMSE - Mini Mental State Exam 12/01/2017 11/26/2016  Orientation to time 5 5  Orientation to Place 5 5  Registration 3 3  Attention/ Calculation 4 5  Recall 3 2  Language- name 2 objects 2 2  Language- repeat 1 1  Language- follow 3 step command 3 3  Language- read & follow direction 1 1  Write a sentence 1 1  Copy design 1 1  Total score 29 29       Ad8 score reviewed for issues:  Issues making decisions: no  Less interest in hobbies / activities: no  Repeats questions, stories (family complaining): no  Trouble using ordinary gadgets (microwave, computer, phone):no  Forgets the month or year: no  Mismanaging finances: no  Remembering appts: no  Daily problems with thinking and/or memory: no Ad8 score is= 0  Immunization History  Administered Date(s) Administered  . Influenza Split 05/28/2011, 06/23/2012  . Influenza Whole 05/25/2007, 05/31/2010  . Influenza, High Dose Seasonal PF 06/27/2015, 04/30/2016, 05/01/2017, 05/04/2018  . Influenza,inj,Quad PF,6+ Mos 04/20/2013, 06/13/2014  . Pneumococcal Conjugate-13 10/31/2014  . Pneumococcal Polysaccharide-23 10/07/2013  . Td 10/07/2013  . Zoster 10/31/2014   Screening Tests Health Maintenance  Topic Date Due  . INFLUENZA VACCINE  02/26/2019  . DEXA SCAN  01/08/2020  . COLONOSCOPY  06/24/2021  . TETANUS/TDAP  10/08/2023  . PNA vac Low Risk Adult  Completed       Plan:    Reviewed health maintenance screenings with patient today and relevant education, vaccines, and/or  referrals were provided.   Continue to eat heart healthy diet (full of fruits, vegetables, whole grains, lean protein, water--limit salt, fat, and sugar intake) and increase physical activity as tolerated.  Continue doing brain stimulating activities (puzzles, reading, adult coloring books, staying active) to keep memory sharp.   I have personally reviewed and noted the following in the patient's chart:   . Medical and social history . Use of alcohol, tobacco or illicit drugs  . Current medications and supplements . Functional ability and status . Nutritional status . Physical activity . Advanced directives . List of other physicians . Vitals . Screenings to include cognitive, depression, and falls . Referrals and appointments  In addition, I have reviewed and discussed with patient certain preventive protocols, quality metrics, and best practice recommendations. A written personalized care plan for preventive services as well as general preventive health recommendations were provided to patient.     Michiel Cowboy, RN  12/08/2018    Medical screening examination/treatment/procedure(s) were performed by non-physician practitioner and as supervising physician I was immediately available for consultation/collaboration. I agree with above. Binnie Rail, MD

## 2018-12-23 ENCOUNTER — Other Ambulatory Visit: Payer: Self-pay | Admitting: Internal Medicine

## 2018-12-28 ENCOUNTER — Telehealth: Payer: Self-pay

## 2018-12-28 NOTE — Telephone Encounter (Signed)
Copied from [place] [id number]. Topic: Appointment Scheduling - Scheduling Inquiry for Clinic >> Dec 28, 2018  9:51 AM Lennox Solders wrote: Reason for CRM: pt is calling and would like Talani Brazee to return her call concerning prolia injection  lvm asking patient to call Rajean Desantiago,RN at [place] office back

## 2019-01-03 ENCOUNTER — Ambulatory Visit: Payer: PPO

## 2019-01-03 ENCOUNTER — Other Ambulatory Visit: Payer: Self-pay

## 2019-01-10 NOTE — Progress Notes (Signed)
Subjective:    Patient ID: [name] [name], female    DOB: 1941-04-07, 78 y.o.   MRN: [id number]  HPI The patient is here for follow up.  She is exercising irregularly.    Hypertension: She is taking her medication daily. She is compliant with a low sodium diet.  She denies chest pain, palpitations, edema, shortness of breath and regular headaches. She does monitor her blood pressure at home on occasion.    Prediabetes:  She is compliant with a low sugar/carbohydrate diet.  She is not exercising regularly.  Hyperlipidemia: She is taking her medication daily. She is compliant with a low fat/cholesterol diet. She denies myalgias.   Hypothyroidism:  She is taking her medication daily.  She denies any recent changes in energy or weight that are unexplained.   GERD:  She is taking her medication daily as prescribed.  She denies any GERD symptoms and feels her GERD is well controlled.   Osteoporosis:  She gets prolia Q 6 months.  She is not exercising.  She is taking calcium and vitamin d daily.      Medications and allergies reviewed with patient and updated if appropriate.  Patient Active Problem List   Diagnosis Date Noted  . Closed wedge compression fracture of T11 vertebra ([place]) 12/30/2017  . Lung nodules, stable, no f/u needed 12/30/2017  . Hypothyroidism 11/01/2016  . Right groin pain 04/30/2016  . Change in bowel habits 04/30/2016  . Prediabetes 11/01/2015  . Spinal stenosis of lumbar region 10/03/2015  . DDD (degenerative disc disease), lumbar   . DJD (degenerative joint disease) of knee   . Hypertension 11/25/2010  . History of colon polyps 05/14/2009  . G E R D 08/17/2007  . Hyperlipidemia 08/16/2007  . HIATAL HERNIA 08/16/2007  . DIVERTICULAR DISEASE 08/16/2007  . IBS 08/16/2007  . Osteoporosis 08/16/2007    Current Outpatient Medications on File Prior to Visit  Medication Sig Dispense Refill  . acetic acid-hydrocortisone (VOSOL-HC) otic solution Place 3 drops  into both ears 2 (two) times daily as needed. 30 mL 1  . aspirin 81 MG EC tablet Take 81 mg by mouth daily. Ecotrin    . Biotin 5 MG CAPS Taking daily  0  . bisoprolol-hydrochlorothiazide (ZIAC) 5-6.25 MG tablet TAKE 1 TABLET BY MOUTH EVERY DAY 90 tablet 3  . calcium-vitamin D (OSCAL WITH D) 500-200 MG-UNIT per tablet Take 1 tablet by mouth 2 (two) times daily.     . clobetasol ointment (TEMOVATE) 2.95 % Apply 1 application topically 2 (two) times daily.    [name] denosumab (PROLIA) 60 MG/ML SOLN injection Inject 60 mg into the skin every 6 (six) months. Administer in upper arm, thigh, or abdomen    . ibuprofen (ADVIL,MOTRIN) 200 MG tablet Take 600 mg by mouth 3 (three) times daily. With meals    . levothyroxine (SYNTHROID, LEVOTHROID) 25 MCG tablet TAKE 1 CAPSULE BY MOUTH DAILY BEFORE BREAKFAST. 90 tablet 3  . Multiple Vitamins-Minerals (CENTRUM SILVER PO) Take by mouth daily.      [name] omeprazole (PRILOSEC) 20 MG capsule Take 1 capsule (20 mg total) by mouth daily. 90 capsule 3  . phenylephrine-shark liver oil-mineral oil-petrolatum (PREPARATION H) 0.25-3-14-71.9 % rectal ointment Place 1 application rectally 2 (two) times daily as needed for hemorrhoids.    . polyethylene glycol (MIRALAX / GLYCOLAX) packet Take 17 g by mouth daily. 2 capfuls daily    . pravastatin (PRAVACHOL) 10 MG tablet TAKE 1 TABLET BY MOUTH EVERY DAY  90 tablet 1  . Wheat Dextrin (BENEFIBER) POWD Twice daily  0   No current facility-administered medications on file prior to visit.     Past Medical History:  Diagnosis Date  . Benign neoplasm of colon   . Cataract    bilateral,had surgery  . DDD (degenerative disc disease), lumbar    MRI w. [facility] 05/2015, s/p ESI x 1 w/ improvement  . Diaphragmatic hernia without mention of obstruction or gangrene   . Diverticulosis of colon (without mention of hemorrhage)   . DJD (degenerative joint disease) of knee    L>R, IA cortisone by [facility] in L 03/2015  . Esophageal reflux    . Gallstones   . History of chicken pox   . Hyperlipidemia   . Hypertension   . Irritable bowel syndrome   . Migraines   . Obesity, unspecified   . Osteoporosis, unspecified   . Tachycardia   . Unspecified hemorrhoids with other complication     Past Surgical History:  Procedure Laterality Date  . APPENDECTOMY  1979  . CHOLECYSTECTOMY  1989  . COLONOSCOPY    . POLYPECTOMY    . VAGINAL HYSTERECTOMY  1996    Social History   Socioeconomic History  . Marital status: Married    Spouse name: Not on file  . Number of children: 2  . Years of education: Not on file  . Highest education level: Not on file  Occupational History  . Occupation: retired  Scientific laboratory technician  . Financial resource strain: Not hard at all  . Food insecurity    Worry: Never true    Inability: Never true  . Transportation needs    Medical: No    Non-medical: No  Tobacco Use  . Smoking status: Never Smoker  . Smokeless tobacco: Never Used  Substance and Sexual Activity  . Alcohol use: No    Alcohol/week: 0.0 standard drinks  . Drug use: No  . Sexual activity: Not Currently  Lifestyle  . Physical activity    Days per week: 0 days    Minutes per session: Not on file  . Stress: Only a little  Relationships  . Social connections    Talks on phone: More than three times a week    Gets together: More than three times a week    Attends religious service: More than 4 times per year    Active member of club or organization: Yes    Attends meetings of clubs or organizations: More than 4 times per year    Relationship status: Married  Other Topics Concern  . Not on file  Social History Narrative   Exercise: no regular exercise    Family History  Problem Relation Age of Onset  . Heart disease Sister        x 2  . Heart disease Brother   . Stroke Mother   . Colon cancer Sister   . Stroke Sister 6    Review of Systems  Constitutional: Negative for chills, fever and unexpected weight change.   Respiratory: Negative for cough, shortness of breath and wheezing.   Cardiovascular: Negative for chest pain, palpitations and leg swelling.  Neurological: Positive for headaches (rare heaviness - ? related to elevated BP ). Negative for dizziness and light-headedness.       Objective:   Vitals:   01/11/19 0936  BP: (!) 152/90  Pulse: 60  Resp: 16  Temp: 98.5 F (36.9 C)  SpO2: 99%   BP  Readings from Last 3 Encounters:  01/11/19 (!) 152/90  05/04/18 (!) 148/92  12/30/17 134/72   Wt Readings from Last 3 Encounters:  01/11/19 135 lb 12.8 oz (61.6 kg)  05/04/18 134 lb (60.8 kg)  12/30/17 132 lb (59.9 kg)   Body mass index is 24.06 kg/m.   Physical Exam    Constitutional: Appears well-developed and well-nourished. No distress.  HENT:  Head: Normocephalic and atraumatic.  Neck: Neck supple. No tracheal deviation present. No thyromegaly present.  No cervical lymphadenopathy Cardiovascular: Normal rate, regular rhythm and normal heart sounds.  No murmur heard. No carotid bruit .  No edema Pulmonary/Chest: Effort normal and breath sounds normal. No respiratory distress. No has no wheezes. No rales.  Skin: Skin is warm and dry. Not diaphoretic.  Psychiatric: Normal mood and affect. Behavior is normal.      Assessment & Plan:    See Problem List for Assessment and Plan of chronic medical problems.

## 2019-01-11 ENCOUNTER — Ambulatory Visit (INDEPENDENT_AMBULATORY_CARE_PROVIDER_SITE_OTHER): Payer: PPO | Admitting: Internal Medicine

## 2019-01-11 ENCOUNTER — Other Ambulatory Visit (INDEPENDENT_AMBULATORY_CARE_PROVIDER_SITE_OTHER): Payer: PPO

## 2019-01-11 ENCOUNTER — Other Ambulatory Visit: Payer: Self-pay

## 2019-01-11 ENCOUNTER — Encounter: Payer: Self-pay | Admitting: Internal Medicine

## 2019-01-11 VITALS — BP 152/90 | HR 60 | Temp 98.5°F | Resp 16 | Ht 63.0 in | Wt 135.8 lb

## 2019-01-11 DIAGNOSIS — R7303 Prediabetes: Secondary | ICD-10-CM

## 2019-01-11 DIAGNOSIS — M810 Age-related osteoporosis without current pathological fracture: Secondary | ICD-10-CM | POA: Diagnosis not present

## 2019-01-11 DIAGNOSIS — E7849 Other hyperlipidemia: Secondary | ICD-10-CM

## 2019-01-11 DIAGNOSIS — E038 Other specified hypothyroidism: Secondary | ICD-10-CM | POA: Diagnosis not present

## 2019-01-11 DIAGNOSIS — E063 Autoimmune thyroiditis: Secondary | ICD-10-CM

## 2019-01-11 DIAGNOSIS — I10 Essential (primary) hypertension: Secondary | ICD-10-CM

## 2019-01-11 DIAGNOSIS — K5900 Constipation, unspecified: Secondary | ICD-10-CM

## 2019-01-11 DIAGNOSIS — K219 Gastro-esophageal reflux disease without esophagitis: Secondary | ICD-10-CM | POA: Diagnosis not present

## 2019-01-11 LAB — CBC WITH DIFFERENTIAL/PLATELET
Basophils Absolute: 0.1 10*3/uL (ref 0.0–0.1)
Basophils Relative: 0.8 % (ref 0.0–3.0)
Eosinophils Absolute: 0.2 10*3/uL (ref 0.0–0.7)
Eosinophils Relative: 2.4 % (ref 0.0–5.0)
HCT: 41.1 % (ref 36.0–46.0)
Hemoglobin: 13.8 g/dL (ref 12.0–15.0)
Lymphocytes Relative: 28.5 % (ref 12.0–46.0)
Lymphs Abs: 1.9 10*3/uL (ref 0.7–4.0)
MCHC: 33.5 g/dL (ref 30.0–36.0)
MCV: 89 fl (ref 78.0–100.0)
Monocytes Absolute: 0.6 10*3/uL (ref 0.1–1.0)
Monocytes Relative: 8.7 % (ref 3.0–12.0)
Neutro Abs: 4 10*3/uL (ref 1.4–7.7)
Neutrophils Relative %: 59.6 % (ref 43.0–77.0)
Platelets: 226 10*3/uL (ref 150.0–400.0)
RBC: 4.62 Mil/uL (ref 3.87–5.11)
RDW: 13.2 % (ref 11.5–15.5)
WBC: 6.7 10*3/uL (ref 4.0–10.5)

## 2019-01-11 LAB — COMPREHENSIVE METABOLIC PANEL
ALT: 13 U/L (ref 0–35)
AST: 18 U/L (ref 0–37)
Albumin: 4.3 g/dL (ref 3.5–5.2)
Alkaline Phosphatase: 38 U/L — ABNORMAL LOW (ref 39–117)
BUN: 15 mg/dL (ref 6–23)
CO2: 31 mEq/L (ref 19–32)
Calcium: 9.9 mg/dL (ref 8.4–10.5)
Chloride: 101 mEq/L (ref 96–112)
Creatinine, Ser: 0.66 mg/dL (ref 0.40–1.20)
GFR: 86.22 mL/min (ref >60.00)
Glucose, Bld: 100 mg/dL — ABNORMAL HIGH (ref 70–99)
Potassium: 4.3 mEq/L (ref 3.5–5.1)
Sodium: 140 mEq/L (ref 135–145)
Total Bilirubin: 1.1 mg/dL (ref 0.2–1.2)
Total Protein: 7.3 g/dL (ref 6.0–8.3)

## 2019-01-11 LAB — LIPID PANEL
Cholesterol: 212 mg/dL — ABNORMAL HIGH (ref 0–200)
HDL: 56.1 mg/dL (ref >39.00)
LDL Cholesterol: 119 mg/dL — ABNORMAL HIGH (ref 0–99)
NonHDL: 155.83
Total CHOL/HDL Ratio: 4
Triglycerides: 185 mg/dL — ABNORMAL HIGH (ref 0.0–149.0)
VLDL: 37 mg/dL (ref 0.0–40.0)

## 2019-01-11 LAB — VITAMIN D 25 HYDROXY (VIT D DEFICIENCY, FRACTURES): VITD: 51.42 ng/mL (ref 30.00–100.00)

## 2019-01-11 LAB — TSH: TSH: 4.14 u[IU]/mL (ref 0.35–4.50)

## 2019-01-11 LAB — HEMOGLOBIN A1C: Hgb A1c MFr Bld: 5.8 % (ref 4.6–6.5)

## 2019-01-11 NOTE — Patient Instructions (Addendum)
  Tests ordered today. Your results will be released to MyChart (or called to you) after review, usually within 72hours after test completion. If any changes need to be made, you will be notified at that same time.  Medications reviewed and updated.  Changes include :   none      Please followup in 6 months   

## 2019-01-11 NOTE — Assessment & Plan Note (Signed)
Getting Prolia every 6 months-due soon and she will get it in the next few weeks Continue calcium and vitamin D daily Encouraged regular exercise DEXA up-to-date CMP, vitamin D level

## 2019-01-11 NOTE — Assessment & Plan Note (Addendum)
BP high here today and occasion elevated at home, but she does not check it regularly She will start monitoring her blood pressure consistently over the next 2 weeks and if it is frequently elevated she will let me know.  Discussed with her that if it is only elevated on a rare occasion we will continue her current medications No change today CMP

## 2019-01-11 NOTE — Assessment & Plan Note (Signed)
Controlled with daily fiber and MiraLAX Continue

## 2019-01-11 NOTE — Assessment & Plan Note (Signed)
GERD controlled Continue daily medication  

## 2019-01-11 NOTE — Assessment & Plan Note (Signed)
Check a1c Low sugar / carb diet Stressed regular exercise   

## 2019-01-11 NOTE — Assessment & Plan Note (Signed)
Clinically euthyroid Check tsh  Titrate med dose if needed  

## 2019-01-11 NOTE — Assessment & Plan Note (Signed)
Check lipid panel  Continue daily statin Regular exercise and healthy diet encouraged  

## 2019-01-17 ENCOUNTER — Other Ambulatory Visit: Payer: Self-pay | Admitting: Internal Medicine

## 2019-01-18 ENCOUNTER — Other Ambulatory Visit: Payer: Self-pay

## 2019-01-18 ENCOUNTER — Ambulatory Visit
Admission: RE | Admit: 2019-01-18 | Discharge: 2019-01-18 | Disposition: A | Discharge status: Home / Self Care | Payer: PPO | Source: Ambulatory Visit | Attending: Obstetrics and Gynecology | Admitting: Obstetrics and Gynecology

## 2019-01-18 DIAGNOSIS — Z1231 Encounter for screening mammogram for malignant neoplasm of breast: Secondary | ICD-10-CM

## 2019-02-03 ENCOUNTER — Telehealth: Payer: Self-pay

## 2019-02-03 NOTE — Telephone Encounter (Signed)
Copied from [place] [id number]. Topic: General - Other >> Feb 03, 2019  3:13 PM Keene Breath wrote: Reason for CRM: Patient called to ask Jonelle Sidle to call her before she schedules her Prolia appt.  She said she had some questions.  CB# [phone]  Patient has been scheduled for nurse visit for 02/07/19--to do in parking lot, any copays will be billed, no money is collected in parking lot

## 2019-02-06 ENCOUNTER — Other Ambulatory Visit: Payer: Self-pay | Admitting: Internal Medicine

## 2019-02-07 ENCOUNTER — Ambulatory Visit (INDEPENDENT_AMBULATORY_CARE_PROVIDER_SITE_OTHER): Payer: PPO

## 2019-02-07 DIAGNOSIS — M810 Age-related osteoporosis without current pathological fracture: Secondary | ICD-10-CM

## 2019-02-07 MED ORDER — DENOSUMAB 60 MG/ML ~~LOC~~ SOSY
60.0000 mg | PREFILLED_SYRINGE | Freq: Once | SUBCUTANEOUS | Status: AC
  Administered 2019-02-07: 60 mg via SUBCUTANEOUS

## 2019-02-07 NOTE — Progress Notes (Signed)
prolia Injection given.   [name] [name] J Maricella Filyaw, MD  

## 2019-05-12 ENCOUNTER — Ambulatory Visit (INDEPENDENT_AMBULATORY_CARE_PROVIDER_SITE_OTHER): Payer: PPO

## 2019-05-12 ENCOUNTER — Other Ambulatory Visit: Payer: Self-pay

## 2019-05-12 DIAGNOSIS — Z23 Encounter for immunization: Secondary | ICD-10-CM | POA: Diagnosis not present

## 2019-06-14 ENCOUNTER — Ambulatory Visit: Payer: Self-pay

## 2019-06-14 NOTE — Telephone Encounter (Signed)
Patient called stating that she spoke to the on call nurse last nigh that asked her to call back today.  She states she has been experiencing off and on headache.  She states that the headache is mor pressure on the sides of her head.  She rates the pain at mild 1-3.  She has felt tired. She has had migraines in the past and these headaches are not like a migraine. Her BP today 116/65 HR 66.  Yesterday 131/65.  She takes medication for BP.  She is concerned that her symptom may be a COVID-19 symptom.  Fatigue and headache.  No fever, or other symptoms.  She has been to the grocery store and knows of no known exposure. Home care read to patient.  She will continue to monitor for symptoms treating her headache with OTC medication.  Call transferred to office for follow-up. Reason for Disposition . [1] COVID-19 infection suspected by caller or triager AND [2] mild symptoms (cough, fever, or others) AND [id number] no complications or SOB  Answer Assessment - Initial Assessment Questions 1. LOCATION: "Where does it hurt?"     Pressure through out both sides 2. ONSET: "When did the headache start?" (Minutes, hours or days)      Last thursday 3. PATTERN: "Does the pain come and go, or has it been constant since it started?"     Comes and goes 4. SEVERITY: "How bad is the pain?" and "What does it keep you from doing?"  (e.g., Scale 1-10; mild, moderate, or severe)   - MILD (1-3): doesn't interfere with normal activities    - MODERATE (4-7): interferes with normal activities or awakens from sleep    - SEVERE (8-10): excruciating pain, unable to do any normal activities       1-3 mild 5. RECURRENT SYMPTOM: "Have you ever had headaches before?" If so, ask: "When was the last time?" and "What happened that time?"     recureent since Thursday Had migraines years ago 6. CAUSE: "What do you think is causing the headache?"    Unsure BP 131/65 yesterday  Today 116/65 hr 66 7. MIGRAINE: "Have you been diagnosed with  migraine headaches?" If so, ask: "Is this headache similar?"     Not anything like migraine 8. HEAD INJURY: "Has there been any recent injury to the head?"     no 9. OTHER SYMPTOMS: "Do you have any other symptoms?" (fever, stiff neck, eye pain, sore throat, cold symptoms)   none 10. PREGNANCY: "Is there any chance you are pregnant?" "When was your last menstrual period?"      N/A  Answer Assessment - Initial Assessment Questions 1. COVID-19 DIAGNOSIS: "Who made your Coronavirus (COVID-19) diagnosis?" "Was it confirmed by a positive lab test?" If not diagnosed by a HCP, ask "Are there lots of cases (community spread) where you live?" (See public health department website, if unsure)     [place] 2. ONSET: "When did the COVID-19 symptoms start?"      Thursday 3. WORST SYMPTOM: "What is your worst symptom?" (e.g., cough, fever, shortness of breath, muscle aches)     headache 4. COUGH: "Do you have a cough?" If so, ask: "How bad is the cough?"       no 5. FEVER: "Do you have a fever?" If so, ask: "What is your temperature, how was it measured, and when did it start?"     no 6. RESPIRATORY STATUS: "Describe your breathing?" (e.g., shortness of breath, wheezing, unable to speak)  no 7. BETTER-SAME-WORSE: "Are you getting better, staying the same or getting worse compared to yesterday?"  If getting worse, ask, "In what way?"    better 8. HIGH RISK DISEASE: "Do you have any chronic medical problems?" (e.g., asthma, heart or lung disease, weak immune system, etc.)    HTN 9. PREGNANCY: "Is there any chance you are pregnant?" "When was your last menstrual period?"    N/A 10. OTHER SYMPTOMS: "Do you have any other symptoms?"  (e.g., chills, fatigue, headache, loss of smell or taste, muscle pain, sore throat)      fatigue  Protocols used: CORONAVIRUS (COVID-19) DIAGNOSED OR SUSPECTED-A-AH, HEADACHE-A-AH

## 2019-06-15 ENCOUNTER — Ambulatory Visit (INDEPENDENT_AMBULATORY_CARE_PROVIDER_SITE_OTHER): Payer: PPO | Admitting: Family

## 2019-06-15 ENCOUNTER — Other Ambulatory Visit: Payer: Self-pay

## 2019-06-15 ENCOUNTER — Encounter: Payer: Self-pay | Admitting: Family

## 2019-06-15 DIAGNOSIS — R5383 Other fatigue: Secondary | ICD-10-CM | POA: Diagnosis not present

## 2019-06-15 DIAGNOSIS — R519 Headache, unspecified: Secondary | ICD-10-CM

## 2019-06-15 NOTE — Progress Notes (Signed)
[name] [name] is a 78 y.o. female with the following history as recorded in [employer]:  Patient Active Problem List   Diagnosis Date Noted  . Closed wedge compression fracture of T11 vertebra ([place]) 12/30/2017  . Lung nodules, stable, no f/u needed 12/30/2017  . Hypothyroidism 11/01/2016  . Constipation 04/30/2016  . Prediabetes 11/01/2015  . Spinal stenosis of lumbar region 10/03/2015  . DDD (degenerative disc disease), lumbar   . DJD (degenerative joint disease) of knee   . Hypertension 11/25/2010  . History of colon polyps 05/14/2009  . G E R D 08/17/2007  . Hyperlipidemia 08/16/2007  . HIATAL HERNIA 08/16/2007  . DIVERTICULAR DISEASE 08/16/2007  . IBS 08/16/2007  . Osteoporosis 08/16/2007    Current Outpatient Medications  Medication Sig Dispense Refill  . acetic acid-hydrocortisone (VOSOL-HC) otic solution Place 3 drops into both ears 2 (two) times daily as needed. 30 mL 1  . aspirin 81 MG EC tablet Take 81 mg by mouth daily. Ecotrin    . Biotin 5 MG CAPS Taking daily  0  . bisoprolol-hydrochlorothiazide (ZIAC) 5-6.25 MG tablet TAKE 1 TABLET BY MOUTH EVERY DAY 90 tablet 3  . calcium-vitamin D (OSCAL WITH D) 500-200 MG-UNIT per tablet Take 1 tablet by mouth 2 (two) times daily.     . clobetasol ointment (TEMOVATE) [id number] % Apply 1 application topically 2 (two) times daily.    [name] denosumab (PROLIA) 60 MG/ML SOLN injection Inject 60 mg into the skin every 6 (six) months. Administer in upper arm, thigh, or abdomen    . ibuprofen (ADVIL,MOTRIN) 200 MG tablet Take 600 mg by mouth 3 (three) times daily. With meals    . levothyroxine (SYNTHROID) 25 MCG tablet TAKE 1 TABLET BY MOUTH EVERY DAY BEFORE BREAKFAST 90 tablet 1  . Multiple Vitamins-Minerals (CENTRUM SILVER PO) Take by mouth daily.      [name] omeprazole (PRILOSEC) 20 MG capsule Take 1 capsule (20 mg total) by mouth daily. 90 capsule 3  . phenylephrine-shark liver oil-mineral oil-petrolatum (PREPARATION H) 0.25-3-14-71.9 % rectal  ointment Place 1 application rectally 2 (two) times daily as needed for hemorrhoids.    . polyethylene glycol (MIRALAX / GLYCOLAX) packet Take 17 g by mouth daily. 2 capfuls daily    . pravastatin (PRAVACHOL) 10 MG tablet TAKE 1 TABLET BY MOUTH EVERY DAY 90 tablet 1  . Wheat Dextrin (BENEFIBER) POWD Twice daily  0   No current facility-administered medications for this visit.     Allergies: Patient has no known allergies.  Past Medical History:  Diagnosis Date  . Benign neoplasm of colon   . Cataract    bilateral,had surgery  . DDD (degenerative disc disease), lumbar    MRI w. [facility] 05/2015, s/p ESI x 1 w/ improvement  . Diaphragmatic hernia without mention of obstruction or gangrene   . Diverticulosis of colon (without mention of hemorrhage)   . DJD (degenerative joint disease) of knee    L>R, IA cortisone by [facility] in L 03/2015  . Esophageal reflux   . Gallstones   . History of chicken pox   . Hyperlipidemia   . Hypertension   . Irritable bowel syndrome   . Migraines   . Obesity, unspecified   . Osteoporosis, unspecified   . Tachycardia   . Unspecified hemorrhoids with other complication     Past Surgical History:  Procedure Laterality Date  . APPENDECTOMY  1979  . CHOLECYSTECTOMY  1989  . COLONOSCOPY    . POLYPECTOMY    .  VAGINAL HYSTERECTOMY  1996    Family History  Problem Relation Age of Onset  . Heart disease Sister        x 2  . Heart disease Brother   . Stroke Mother   . Colon cancer Sister   . Stroke Sister 31    Social History   Tobacco Use  . Smoking status: Never Smoker  . Smokeless tobacco: Never Used  Substance Use Topics  . Alcohol use: No    Alcohol/week: 0.0 standard drinks    Subjective:     I connected with [name] [name] on 06/15/19 at  8:40 AM EST by a telephone call  and verified that I am speaking with the correct person using two identifiers.   I discussed the limitations of evaluation and management by telemedicine and the  availability of in person appointments. The patient expressed understanding and agreed to proceed.  Patient was questioning if she needed to get COVID testing as she had had some headaches last week; notes she had been headache free for the past 2 days and actually "feels very good." Denies any chest pain, shortness of breath, blurred vision; no fever, cough or loss of taste or smell; has been checking blood pressure and well controlled at 136/ 72; Notes she has not had known exposure to the virus;    Objective:  There were no vitals filed for this visit.  Lungs: Respirations unlabored;  Neurologic: Alert and oriented; speech intact;  Assessment:  1. Other fatigue   2. Acute nonintractable headache, unspecified headache type     Plan:  Patient notes she has been feeling very good for the past 2 days and has no symptoms; discussed option for COVID testing at [facility] and she defers at this time; she will continue to monitor her blood pressure and follow-up in office if symptoms return, persist.  Keep planned follow-up with Dr. Quay Burow in 4 weeks.   Time spent 11 minutes;   No follow-ups on file.  No orders of the defined types were placed in this encounter.   Requested Prescriptions    No prescriptions requested or ordered in this encounter

## 2019-06-16 DIAGNOSIS — Z01419 Encounter for gynecological examination (general) (routine) without abnormal findings: Secondary | ICD-10-CM | POA: Diagnosis not present

## 2019-06-16 DIAGNOSIS — Z6826 Body mass index (BMI) 26.0-26.9, adult: Secondary | ICD-10-CM | POA: Diagnosis not present

## 2019-06-17 ENCOUNTER — Other Ambulatory Visit: Payer: Self-pay

## 2019-06-17 DIAGNOSIS — Z20822 Contact with and (suspected) exposure to covid-19: Secondary | ICD-10-CM

## 2019-06-20 LAB — NOVEL CORONAVIRUS, NAA: SARS-CoV-2, NAA: NOT DETECTED

## 2019-06-21 ENCOUNTER — Telehealth: Payer: Self-pay | Admitting: General Practice

## 2019-06-21 NOTE — Telephone Encounter (Signed)
Negative COVID results given. Patient results "NOT Detected." Caller expressed understanding. ° °

## 2019-07-05 DIAGNOSIS — H6123 Impacted cerumen, bilateral: Secondary | ICD-10-CM | POA: Insufficient documentation

## 2019-07-14 ENCOUNTER — Ambulatory Visit: Payer: PPO | Admitting: Internal Medicine

## 2019-07-20 ENCOUNTER — Other Ambulatory Visit: Payer: Self-pay | Admitting: Internal Medicine

## 2019-07-31 NOTE — Progress Notes (Signed)
Subjective:    Patient ID: [name] [name], female    DOB: [birth year], 79 y.o.   MRN: [id number]  HPI The patient is here for follow up.  She is taking all of her medications as prescribed.   Last prolia 01/2019.  Her next injection is due this month.  She is not exercising regularly.    One of her main activities is yard work and she has not been doing much yard work.  BP at home - 131/65, 116/62  HR 66  Medications and allergies reviewed with patient and updated if appropriate.  Patient Active Problem List   Diagnosis Date Noted  . Closed wedge compression fracture of T11 vertebra ([place]) 12/30/2017  . Lung nodules, stable, no f/u needed 12/30/2017  . Hypothyroidism 11/01/2016  . Constipation 04/30/2016  . Prediabetes 11/01/2015  . Spinal stenosis of lumbar region 10/03/2015  . DDD (degenerative disc disease), lumbar   . DJD (degenerative joint disease) of knee   . Hypertension 11/25/2010  . History of colon polyps 05/14/2009  . G E R D 08/17/2007  . Hyperlipidemia 08/16/2007  . HIATAL HERNIA 08/16/2007  . DIVERTICULAR DISEASE 08/16/2007  . IBS 08/16/2007  . Osteoporosis 08/16/2007    Current Outpatient Medications on File Prior to Visit  Medication Sig Dispense Refill  . aspirin 81 MG EC tablet Take 81 mg by mouth daily. Ecotrin    . Biotin 5 MG CAPS Taking daily  0  . bisoprolol-hydrochlorothiazide (ZIAC) 5-6.25 MG tablet TAKE 1 TABLET BY MOUTH EVERY DAY 90 tablet 3  . calcium-vitamin D (OSCAL WITH D) 500-200 MG-UNIT per tablet Take 1 tablet by mouth 2 (two) times daily.     . clobetasol ointment (TEMOVATE) AB-123456789 % Apply 1 application topically 2 (two) times daily.    [name] denosumab (PROLIA) 60 MG/ML SOLN injection Inject 60 mg into the skin every 6 (six) months. Administer in upper arm, thigh, or abdomen    . ibuprofen (ADVIL,MOTRIN) 200 MG tablet Take 600 mg by mouth 3 (three) times daily. With meals    . levothyroxine (SYNTHROID) 25 MCG tablet TAKE 1 TABLET BY MOUTH  EVERY DAY BEFORE BREAKFAST 90 tablet 0  . Multiple Vitamins-Minerals (CENTRUM SILVER PO) Take by mouth daily.      . phenylephrine-shark liver oil-mineral oil-petrolatum (PREPARATION H) 0.25-3-14-71.9 % rectal ointment Place 1 application rectally 2 (two) times daily as needed for hemorrhoids.    . polyethylene glycol (MIRALAX / GLYCOLAX) packet Take 17 g by mouth daily. 2 capfuls daily    . pravastatin (PRAVACHOL) 10 MG tablet TAKE 1 TABLET BY MOUTH EVERY DAY 90 tablet 1  . Wheat Dextrin (BENEFIBER) POWD Twice daily  0   No current facility-administered medications on file prior to visit.    Past Medical History:  Diagnosis Date  . Benign neoplasm of colon   . Cataract    bilateral,had surgery  . DDD (degenerative disc disease), lumbar    MRI w. [facility] 05/2015, s/p ESI x 1 w/ improvement  . Diaphragmatic hernia without mention of obstruction or gangrene   . Diverticulosis of colon (without mention of hemorrhage)   . DJD (degenerative joint disease) of knee    L>R, IA cortisone by [facility] in L 03/2015  . Esophageal reflux   . Gallstones   . History of chicken pox   . Hyperlipidemia   . Hypertension   . Irritable bowel syndrome   . Migraines   . Obesity, unspecified   .  Osteoporosis, unspecified   . Tachycardia   . Unspecified hemorrhoids with other complication     Past Surgical History:  Procedure Laterality Date  . APPENDECTOMY  1979  . CHOLECYSTECTOMY  1989  . COLONOSCOPY    . POLYPECTOMY    . VAGINAL HYSTERECTOMY  1996    Social History   Socioeconomic History  . Marital status: Married    Spouse name: Not on file  . Number of children: 2  . Years of education: Not on file  . Highest education level: Not on file  Occupational History  . Occupation: retired  Tobacco Use  . Smoking status: Never Smoker  . Smokeless tobacco: Never Used  Substance and Sexual Activity  . Alcohol use: No    Alcohol/week: 0.0 standard drinks  . Drug use: No  . Sexual  activity: Not Currently  Other Topics Concern  . Not on file  Social History Narrative   Exercise: no regular exercise   Social Determinants of Health   Financial Resource Strain:   . Difficulty of Paying Living Expenses: Not on file  Food Insecurity:   . Worried About Charity fundraiser in the Last Year: Not on file  . Ran Out of Food in the Last Year: Not on file  Transportation Needs:   . Lack of Transportation (Medical): Not on file  . Lack of Transportation (Non-Medical): Not on file  Physical Activity: Unknown  . Days of Exercise per Week: 0 days  . Minutes of Exercise per Session: Not asked  Stress: No Stress Concern Present  . Feeling of Stress : Only a little  Social Connections:   . Frequency of Communication with Friends and Family: Not on file  . Frequency of Social Gatherings with Friends and Family: Not on file  . Attends Religious Services: Not on file  . Active Member of Clubs or Organizations: Not on file  . Attends Archivist Meetings: Not on file  . Marital Status: Not on file    Family History  Problem Relation Age of Onset  . Heart disease Sister        x 2  . Heart disease Brother   . Stroke Mother   . Colon cancer Sister   . Stroke Sister 79    Review of Systems  Constitutional: Negative for chills, fatigue, fever and unexpected weight change.  Respiratory: Negative for cough, shortness of breath and wheezing.   Cardiovascular: Negative for chest pain, palpitations and leg swelling.  Neurological: Negative for light-headedness and headaches.       Objective:   Vitals:   08/02/19 0918  BP: (!) 166/84  Pulse: 73  Resp: 16  Temp: 98.5 F (36.9 C)  SpO2: 99%   BP Readings from Last 3 Encounters:  08/02/19 (!) 166/84  01/11/19 (!) 152/90  05/04/18 (!) 148/92   Wt Readings from Last 3 Encounters:  08/02/19 133 lb 12.8 oz (60.7 kg)  01/11/19 135 lb 12.8 oz (61.6 kg)  05/04/18 134 lb (60.8 kg)   Body mass index is 23.7  kg/m.   Physical Exam    Constitutional: Appears well-developed and well-nourished. No distress.  HENT:  Head: Normocephalic and atraumatic.  Neck: Neck supple. No tracheal deviation present. No thyromegaly present.  No cervical lymphadenopathy Cardiovascular: Normal rate, regular rhythm and normal heart sounds.   No murmur heard. No carotid bruit .  No edema Pulmonary/Chest: Effort normal and breath sounds normal. No respiratory distress. No has no wheezes. No rales.  Skin: Skin is warm and dry. Not diaphoretic.  Psychiatric: Normal mood and affect. Behavior is normal.      Assessment & Plan:    See Problem List for Assessment and Plan of chronic medical problems.    This visit occurred during the SARS-CoV-2 public health emergency.  Safety protocols were in place, including screening questions prior to the visit, additional usage of staff PPE, and extensive cleaning of exam room while observing appropriate contact time as indicated for disinfecting solutions.

## 2019-07-31 NOTE — Patient Instructions (Addendum)
  Blood work was ordered.     Medications reviewed and updated.  Changes include :   none  Your prescription(s) have been submitted to your pharmacy. Please take as directed and contact our office if you believe you are having problem(s) with the medication(s).   Please followup in 6 months   

## 2019-08-02 ENCOUNTER — Encounter: Payer: Self-pay | Admitting: Internal Medicine

## 2019-08-02 ENCOUNTER — Other Ambulatory Visit: Payer: Self-pay

## 2019-08-02 ENCOUNTER — Ambulatory Visit (INDEPENDENT_AMBULATORY_CARE_PROVIDER_SITE_OTHER): Payer: PPO | Admitting: Internal Medicine

## 2019-08-02 VITALS — BP 166/84 | HR 73 | Temp 98.5°F | Resp 16 | Ht 63.0 in | Wt 133.8 lb

## 2019-08-02 DIAGNOSIS — E063 Autoimmune thyroiditis: Secondary | ICD-10-CM | POA: Diagnosis not present

## 2019-08-02 DIAGNOSIS — I10 Essential (primary) hypertension: Secondary | ICD-10-CM

## 2019-08-02 DIAGNOSIS — E038 Other specified hypothyroidism: Secondary | ICD-10-CM

## 2019-08-02 DIAGNOSIS — E7849 Other hyperlipidemia: Secondary | ICD-10-CM

## 2019-08-02 DIAGNOSIS — R7303 Prediabetes: Secondary | ICD-10-CM | POA: Diagnosis not present

## 2019-08-02 DIAGNOSIS — M810 Age-related osteoporosis without current pathological fracture: Secondary | ICD-10-CM

## 2019-08-02 DIAGNOSIS — K219 Gastro-esophageal reflux disease without esophagitis: Secondary | ICD-10-CM | POA: Diagnosis not present

## 2019-08-02 LAB — COMPREHENSIVE METABOLIC PANEL
ALT: 14 U/L (ref 0–35)
AST: 22 U/L (ref 0–37)
Albumin: 4.4 g/dL (ref 3.5–5.2)
Alkaline Phosphatase: 39 U/L (ref 39–117)
BUN: 16 mg/dL (ref 6–23)
CO2: 33 mEq/L — ABNORMAL HIGH (ref 19–32)
Calcium: 10.2 mg/dL (ref 8.4–10.5)
Chloride: 101 mEq/L (ref 96–112)
Creatinine, Ser: 0.68 mg/dL (ref 0.40–1.20)
GFR: 83.18 mL/min (ref >60.00)
Glucose, Bld: 93 mg/dL (ref 70–99)
Potassium: 4.4 mEq/L (ref 3.5–5.1)
Sodium: 140 mEq/L (ref 135–145)
Total Bilirubin: 1.3 mg/dL — ABNORMAL HIGH (ref 0.2–1.2)
Total Protein: 7.5 g/dL (ref 6.0–8.3)

## 2019-08-02 LAB — TSH: TSH: 3.13 u[IU]/mL (ref 0.35–4.50)

## 2019-08-02 LAB — HEMOGLOBIN A1C: Hgb A1c MFr Bld: 5.6 % (ref 4.6–6.5)

## 2019-08-02 MED ORDER — OMEPRAZOLE 20 MG PO CPDR: 20.0000 mg | DELAYED_RELEASE_CAPSULE | Freq: Every day | ORAL | 3 refills | Status: DC

## 2019-08-02 NOTE — Assessment & Plan Note (Addendum)
Chronic  Clinically euthyroid Check tsh  Titrate med dose if needed  

## 2019-08-02 NOTE — Assessment & Plan Note (Signed)
Chronic, stable Check lipid panel  Continue daily statin Regular exercise and healthy diet encouraged

## 2019-08-02 NOTE — Assessment & Plan Note (Signed)
GERD controlled Continue daily medication - alternates omeprazole with pepcid

## 2019-08-02 NOTE — Assessment & Plan Note (Addendum)
Chronic Check a1c Low sugar / carb diet Stressed regular exercise  

## 2019-08-02 NOTE — Assessment & Plan Note (Addendum)
Chronic BP well controlled at home - will monitor at home Will bring cuff in next visit ? controlled, possible whitecoat hypertension Continue current medications at current doses cmp

## 2019-08-02 NOTE — Assessment & Plan Note (Addendum)
Chronic Due for prolia this month Taking calcium, vitamin d Encouraged regular exercise

## 2019-08-05 ENCOUNTER — Telehealth: Payer: Self-pay

## 2019-08-05 NOTE — Telephone Encounter (Signed)
Copied from [place] [phone]. Topic: General - Other >> Aug 04, 2019 11:20 AM Antonieta Iba C wrote: Reason for CRM: pt called in to speak with Lovena Le, pt declined speaking with me, asked that Lovena Le give her a call back.

## 2019-08-09 ENCOUNTER — Telehealth: Payer: Self-pay | Admitting: Internal Medicine

## 2019-08-09 NOTE — Telephone Encounter (Signed)
Of course this has not been studied.  There is no reason to think there would be any interaction between Prolia and the Covid vaccine so I do not think she needs to delay the Covid vaccine.

## 2019-08-09 NOTE — Telephone Encounter (Signed)
Routing to dr burns---patient is due her next prolia injection, she has just received COVID vaccine----no research (to my knowledge) with patients on prolia also getting covid vaccine---do you think it would be best to wait one month after 2nd covid vaccine to get next prolia shot?  Or do you think safe to do prolia now? Please advise, I will call patient back, thanks

## 2019-08-09 NOTE — Telephone Encounter (Signed)
Patient calling states she received Covid19 vaccine on 1/11. Patient would like to know when she should schedule her next Prolia injection.  Please advise   Copied from [place] #[id number]. Topic: General - Other >> Aug 09, 2019 11:29 AM Alanda Slim E wrote: Reason for CRM: Pt requested call from [facility] / please advise

## 2019-08-10 NOTE — Telephone Encounter (Signed)
Patient advised of dr burns note/instructions

## 2019-09-16 ENCOUNTER — Telehealth: Payer: Self-pay | Admitting: Internal Medicine

## 2019-09-16 DIAGNOSIS — E7849 Other hyperlipidemia: Secondary | ICD-10-CM

## 2019-09-16 NOTE — Chronic Care Management (AMB) (Signed)
  Chronic Care Management   Note  09/16/2019 Name: [name] [name] MRN: [id number] DOB: Aug 29, 1940  [name] [name] is a 79 y.o. year old female who is a primary care patient of Burns, Claudina Lick, MD. I reached out to [name] by phone today in response to a referral sent by [name] [name] PCP, Binnie Rail, MD.   [name] [name] was given information about Chronic Care Management services today including:  1. CCM service includes personalized support from designated clinical staff supervised by her physician, including individualized plan of care and coordination with other care providers 2. 24/7 contact phone numbers for assistance for urgent and routine care needs. 3. Service will only be billed when office clinical staff spend 20 minutes or more in a month to coordinate care. 4. Only one practitioner may furnish and bill the service in a calendar month. 5. The patient may stop CCM services at any time (effective at the end of the month) by phone call to the office staff. 6. The patient will be responsible for cost sharing (co-pay) of up to 20% of the service fee (after annual deductible is met).  Patient agreed to services and verbal consent obtained.   Follow up plan:   Raynicia Dukes [facility] Scheduler

## 2019-09-21 NOTE — Addendum Note (Signed)
Addended by: Karle Barr on: 09/21/2019 09:10 PM   Modules accepted: Orders

## 2019-09-21 NOTE — Chronic Care Management (AMB) (Signed)
Chronic Care Management Pharmacy  Name: [name] [name]  MRN: [id number] DOB: 04/27/1941  Chief Complaint/ HPI  [name] [name],  79 y.o. , female presents for their Initial CCM visit with the clinical pharmacist via telephone due to COVID-19 Pandemic.  PCP : Binnie Rail, MD  Their chronic conditions include: HTN, HLD, GERD, IBS, hypothyroidism, osteoporosis, prediabetes, DDD  Office Visits: 08/02/19 Dr Quay Burow: pt stable, no med changes. CMP wnl.   Consult Visit: 06/16/19 Dr Matthew Saras (OBGYN): via claims data. 06/15/19 NP Valere Dross (IM): COVID screening, no action taken.  Medications: Outpatient Encounter Medications as of 09/22/2019  Medication Sig  . aspirin 81 MG EC tablet Take 81 mg by mouth daily. Ecotrin  . Biotin 5 MG CAPS Taking daily  . bisoprolol-hydrochlorothiazide (ZIAC) 5-6.25 MG tablet TAKE 1 TABLET BY MOUTH EVERY DAY  . calcium-vitamin D (OSCAL WITH D) 500-200 MG-UNIT per tablet Take 1 tablet by mouth 2 (two) times daily.   . clobetasol ointment (TEMOVATE) AB-123456789 % Apply 1 application topically 2 (two) times daily.  [name] denosumab (PROLIA) 60 MG/ML SOLN injection Inject 60 mg into the skin every 6 (six) months. Administer in upper arm, thigh, or abdomen  . famotidine (PEPCID) 20 MG tablet Take 20 mg by mouth daily.  [name] ibuprofen (ADVIL,MOTRIN) 200 MG tablet Take 600 mg by mouth 3 (three) times daily. With meals  . levothyroxine (SYNTHROID) 25 MCG tablet TAKE 1 TABLET BY MOUTH EVERY DAY BEFORE BREAKFAST  . Multiple Vitamins-Minerals (CENTRUM SILVER PO) Take by mouth daily.    [name] omeprazole (PRILOSEC) 20 MG capsule Take 1 capsule (20 mg total) by mouth daily.  . phenylephrine-shark liver oil-mineral oil-petrolatum (PREPARATION H) 0.25-3-14-71.9 % rectal ointment Place 1 application rectally 2 (two) times daily as needed for hemorrhoids.  . polyethylene glycol (MIRALAX / GLYCOLAX) packet Take 17 g by mouth daily. 2 capfuls daily  . pravastatin (PRAVACHOL) 10 MG tablet TAKE 1 TABLET BY  MOUTH EVERY DAY  . Wheat Dextrin (BENEFIBER) POWD Twice daily   No facility-administered encounter medications on file as of 09/22/2019.    Current Diagnosis/Assessment:  Goals Addressed            This Visit's Progress   . Pharmacy Care Plan       Current Barriers:  . Chronic Disease Management support, education, and care coordination needs related to HTN, HLD, and GERD, osteoporosis  Pharmacist Clinical Goal(s):  [name] Maintain BP < 140/90 . Maintain LDL < 100 . Ensure safety, efficacy, and affordability of medications  Interventions: . Comprehensive medication review performed. . Discontinue aspirin due to lack of benefit and increased risks over 35 years old . Monitor status of PAN foundation application for Prolia . Take famotidine (Pepcid) 15-30 minutes before a meal for optimal effects . Consider [facility] pharmacy for medication synchronization, packaging and delivery  Patient Self Care Activities:  . Self administers medications as prescribed, Calls pharmacy for medication refills, and Calls provider office for new concerns or questions  Initial goal documentation       Hypertension    Office blood pressures are  BP Readings from Last 3 Encounters:  08/02/19 (!) 166/84  01/11/19 (!) 152/90  05/04/18 (!) 148/92   Lab Results  Component Value Date   CREATININE 0.68 08/02/2019   CREATININE 0.66 01/11/2019   CREATININE 0.63 12/15/2017    Patient has failed these meds in the past: n/a Patient is currently controlled on the following meds: bisoprolol/HCTZ 5/6.25 mg daily  Patient checks BP  at home infrequently  Patient home BP readings are ranging: 135-140/70s  We discussed diet and exercise extensively, what white coat hypertension is, her BP goal of < 140/90. Pt endorses infrequent dizziness upon standing, counseled on standing slowly and/or with support to prevent falls.   Plan  Continue current medications and control with diet and exercise       Hyperlipidemia   Lipid Panel     Component Value Date/Time   CHOL 212 (H) 01/11/2019 1019   TRIG 185.0 (H) 01/11/2019 1019   TRIG 237 (HH) 08/10/2006 0922   HDL 56.10 01/11/2019 1019   CHOLHDL 4 01/11/2019 1019   VLDL 37.0 01/11/2019 1019   LDLCALC 119 (H) 01/11/2019 1019    ASCVD 10-year risk: invalid due to age  Patient has failed these meds in past: simvastatin, ezetimibe Patient is currently uncontrolled on the following medications: pravastatin 10 mg daily, aspirin 81 mg daily  We discussed:  diet and exercise extensively, role of cholesterol in the body, statin mechanism, benefits of statin outside of just lowering cholesterol. Discussed benefits/risks of aspirin for primary prevention in relatively healthy people over 70. Patient agreed to stop taking aspirin.  Diet: Breakfast - egg beaters, cereal Lunch - vegetarian, no pork, little chicken Dinner - light supper, fried foods 1-2 times/wk, fewer canned foods Drinks - water, juice/milk in AM  Plan  Continue current medications and control with diet and exercise  Discontinue aspirin d/t risk > benefit over age 5   Diabetes   Recent Relevant Labs: Lab Results  Component Value Date/Time   HGBA1C 5.6 08/02/2019 10:16 AM   HGBA1C 5.8 01/11/2019 10:19 AM    No Medication indicated.  We discussed: diet and exercise extensively  Plan  Continue control with diet and exercise   Hypothyroidism   TSH  Date Value Ref Range Status  08/02/2019 3.13 0.35 - 4.50 uIU/mL Final     Patient has failed these meds in past: n/a Patient is currently controlled on the following medications: levothyroxine 25 mcg daily  We discussed:  Patient takes med if she wakes up at night around 3-4 am, otherwise first thing in the morning. She separates calcium supplement by at least 4 hrs. Discussed long half life of levothyroxine, small changes in her routine are unlikely to effect her clinically.  Plan  Continue current  medications  GERD/GI   Patient has failed these meds in past: ranitidine Patient is currently controlled on the following medications: famotidine 20 mg daily, omeprazole 20 mg daily, Miralax, Benefiber  We discussed:  Pt takes famotidine daily, discussed optimal timing of med around a meal. She takes omeprazole ~once a week if she needs something stronger. Miralax and Benefiber help her regularity.  Plan  Continue current medications    Osteoporosis    Patient has failed these meds in past: n/a Patient is currently controlled on the following medications: Prolia q 6 months  We discussed:  Pt is in process of receiving med through the PAN foundation d/t high copay (>$200 per pt). She spoke with pharmacist at [facility] who will get med through specialty pharmacy. She wants to get injection by 3/8, need to check status of PAN application.  Plan  Continue current medications  Check on PAN application for Prolia  Health Maintenance   Patient is currently controlled on the following medications: Biotin, calcium-Vitamin D 500-200 BID, multivitamin, ibuprofen 600 mg TID prn  We discussed:  Patient doing well with OTC supplements, denies issues.  Plan Continue current  medications  Medication Management   Pt uses [facility] pharmacy for all medications Does not use pill box - sets up meds for the day each morning to help her remember if she's taken med already or not Pt endorses 100% compliance with Rx meds - occasionally forgets calcium supplement  We discussed:  Benefits of med synchronization, packaging and delivery with [facility]. Pt wants to think about it and will call me if she wants to switch from [facility].  Plan  Continue current med management strategy   Follow up: 4 month phone visit  Charlene Brooke, PharmD Clinical Pharmacist [facility] [phone]

## 2019-09-22 ENCOUNTER — Ambulatory Visit: Payer: PPO | Admitting: Pharmacist

## 2019-09-22 ENCOUNTER — Other Ambulatory Visit: Payer: Self-pay

## 2019-09-22 DIAGNOSIS — I10 Essential (primary) hypertension: Secondary | ICD-10-CM

## 2019-09-22 DIAGNOSIS — E7849 Other hyperlipidemia: Secondary | ICD-10-CM

## 2019-09-22 DIAGNOSIS — K219 Gastro-esophageal reflux disease without esophagitis: Secondary | ICD-10-CM

## 2019-09-22 DIAGNOSIS — M810 Age-related osteoporosis without current pathological fracture: Secondary | ICD-10-CM

## 2019-09-22 DIAGNOSIS — E038 Other specified hypothyroidism: Secondary | ICD-10-CM

## 2019-09-22 DIAGNOSIS — R7303 Prediabetes: Secondary | ICD-10-CM

## 2019-09-22 NOTE — Progress Notes (Signed)
  I have reviewed this encounter including the documentation in this note and/or discussed this patient with the care management provider. I am certifying that I agree with the content of this note as supervising physician.  Binnie Rail, MD   09/22/2019

## 2019-09-22 NOTE — Patient Instructions (Addendum)
Visit Information  Thank you for meeting with me to discuss your medications! I look forward to working with you to achieve your health care goals. Below is a summary of what we talked about during the visit:  Goals Addressed            This Visit's Progress   . Pharmacy Care Plan       Current Barriers:  . Chronic Disease Management support, education, and care coordination needs related to HTN, HLD, and GERD, osteoporosis  Pharmacist Clinical Goal(s):  [name] Maintain BP < 140/90 . Maintain LDL < 100 . Ensure safety, efficacy, and affordability of medications  Interventions: . Comprehensive medication review performed. . Discontinue aspirin due to lack of benefit and increased risks over 81 years old . Monitor status of PAN foundation application for Prolia . Take famotidine (Pepcid) 15-30 minutes before a meal for optimal effects . Consider [facility] for medication synchronization, packaging and delivery  Patient Self Care Activities:  . Self administers medications as prescribed, Calls pharmacy for medication refills, and Calls provider office for new concerns or questions  Initial goal documentation       Ms. [name] [name] was given information about Chronic Care Management services today including:  1. CCM service includes personalized support from designated clinical staff supervised by her physician, including individualized plan of care and coordination with other care providers 2. 24/7 contact phone numbers for assistance for urgent and routine care needs. 3. Service will only be billed when office clinical staff spend 20 minutes or more in a month to coordinate care. 4. Only one practitioner may furnish and bill the service in a calendar month. 5. The patient may stop CCM services at any time (effective at the end of the month) by phone call to the office staff. 6. The patient will be responsible for cost sharing (co-pay) of up to 20% of the service fee (after annual  deductible is met).  Patient agreed to services and verbal consent obtained.   The patient verbalized understanding of instructions provided today and agreed to receive a mailed copy of patient instruction and/or educational materials. Telephone follow up appointment with pharmacy team member scheduled for: 01/25/20 @ 11:30am   Charlene Brooke, PharmD Clinical Pharmacist [facility] [phone]    Heart-Healthy Eating Plan Heart-healthy meal planning includes:  Eating less unhealthy fats.  Eating more healthy fats.  Making other changes in your diet. Talk with your doctor or a diet specialist (dietitian) to create an eating plan that is right for you. What are tips for following this plan? Cooking Avoid frying your food. Try to bake, boil, grill, or broil it instead. You can also reduce fat by:  Removing the skin from poultry.  Removing all visible fats from meats.  Steaming vegetables in water or broth. Meal planning   At meals, divide your plate into four equal parts: ? Fill one-half of your plate with vegetables and green salads. ? Fill one-fourth of your plate with whole grains. ? Fill one-fourth of your plate with lean protein foods.  Eat 4-5 servings of vegetables per day. A serving of vegetables is: ? 1 cup of raw or cooked vegetables. ? 2 cups of raw leafy greens.  Eat 4-5 servings of fruit per day. A serving of fruit is: ? 1 medium whole fruit. ?  cup of dried fruit. ?  cup of fresh, frozen, or canned fruit. ?  cup of 100% fruit juice.  Eat more foods  that have soluble fiber. These are apples, broccoli, carrots, beans, peas, and barley. Try to get 20-30 g of fiber per day.  Eat 4-5 servings of nuts, legumes, and seeds per week: ? 1 serving of dried beans or legumes equals  cup after being cooked. ? 1 serving of nuts is  cup. ? 1 serving of seeds equals 1 tablespoon. General information  Eat more home-cooked food. Eat  less restaurant, buffet, and fast food.  Limit or avoid alcohol.  Limit foods that are high in starch and sugar.  Avoid fried foods.  Lose weight if you are overweight.  Keep track of how much salt (sodium) you eat. This is important if you have high blood pressure. Ask your doctor to tell you more about this.  Try to add vegetarian meals each week. Fats  Choose healthy fats. These include olive oil and canola oil, flaxseeds, walnuts, almonds, and seeds.  Eat more omega-3 fats. These include salmon, mackerel, sardines, tuna, flaxseed oil, and ground flaxseeds. Try to eat fish at least 2 times each week.  Check food labels. Avoid foods with trans fats or high amounts of saturated fat.  Limit saturated fats. ? These are often found in animal products, such as meats, butter, and cream. ? These are also found in plant foods, such as palm oil, palm kernel oil, and coconut oil.  Avoid foods with partially hydrogenated oils in them. These have trans fats. Examples are stick margarine, some tub margarines, cookies, crackers, and other baked goods. What foods can I eat? Fruits All fresh, canned (in natural juice), or frozen fruits. Vegetables Fresh or frozen vegetables (raw, steamed, roasted, or grilled). Green salads. Grains Most grains. Choose whole wheat and whole grains most of the time. Rice and pasta, including brown rice and pastas made with whole wheat. Meats and other proteins Lean, well-trimmed beef, veal, pork, and lamb. Chicken and [place] without skin. All fish and shellfish. Wild duck, rabbit, pheasant, and venison. Egg whites or low-cholesterol egg substitutes. Dried beans, peas, lentils, and tofu. Seeds and most nuts. Dairy Low-fat or nonfat cheeses, including ricotta and mozzarella. Skim or 1% milk that is liquid, powdered, or evaporated. Buttermilk that is made with low-fat milk. Nonfat or low-fat yogurt. Fats and oils Non-hydrogenated (trans-free) margarines. Vegetable  oils, including soybean, sesame, sunflower, olive, peanut, safflower, corn, canola, and cottonseed. Salad dressings or mayonnaise made with a vegetable oil. Beverages Mineral water. Coffee and tea. Diet carbonated beverages. Sweets and desserts Sherbet, gelatin, and fruit ice. Small amounts of dark chocolate. Limit all sweets and desserts. Seasonings and condiments All seasonings and condiments. The items listed above may not be a complete list of foods and drinks you can eat. Contact a dietitian for more options. What foods should I avoid? Fruits Canned fruit in heavy syrup. Fruit in cream or butter sauce. Fried fruit. Limit coconut. Vegetables Vegetables cooked in cheese, cream, or butter sauce. Fried vegetables. Grains Breads that are made with saturated or trans fats, oils, or whole milk. Croissants. Sweet rolls. Donuts. High-fat crackers, such as cheese crackers. Meats and other proteins Fatty meats, such as hot dogs, ribs, sausage, bacon, rib-eye roast or steak. High-fat deli meats, such as salami and bologna. Caviar. Domestic duck and goose. Organ meats, such as liver. Dairy Cream, sour cream, cream cheese, and creamed cottage cheese. Whole-milk cheeses. Whole or 2% milk that is liquid, evaporated, or condensed. Whole buttermilk. Cream sauce or high-fat cheese sauce. Yogurt that is made from whole milk. Fats and  oils Meat fat, or shortening. Cocoa butter, hydrogenated oils, palm oil, coconut oil, palm kernel oil. Solid fats and shortenings, including bacon fat, salt pork, lard, and butter. Nondairy cream substitutes. Salad dressings with cheese or sour cream. Beverages Regular sodas and juice drinks with added sugar. Sweets and desserts Frosting. Pudding. Cookies. Cakes. Pies. Milk chocolate or white chocolate. Buttered syrups. Full-fat ice cream or ice cream drinks. The items listed above may not be a complete list of foods and drinks to avoid. Contact a dietitian for more  information. Summary  Heart-healthy meal planning includes eating less unhealthy fats, eating more healthy fats, and making other changes in your diet.  Eat a balanced diet. This includes fruits and vegetables, low-fat or nonfat dairy, lean protein, nuts and legumes, whole grains, and heart-healthy oils and fats. This information is not intended to replace advice given to you by your health care provider. Make sure you discuss any questions you have with your health care provider. Document Revised: 09/17/2017 Document Reviewed: 08/21/2017 Elsevier Patient Education  2020 [employer].

## 2019-09-29 ENCOUNTER — Telehealth: Payer: Self-pay | Admitting: Internal Medicine

## 2019-09-29 NOTE — Telephone Encounter (Signed)
Can you help with this please.

## 2019-09-29 NOTE — Telephone Encounter (Addendum)
Per patient she was approved by PAN foundation in December for Prolia for 120 days (exp 11/12/19). She gave the following [facility] local [facility]:  PAN foundation pharmacy benefit: $500 ID: [id number] BIN: [id number] PCN: [id number] GRP: [id number]  [facility]oke with [facility], they do not have an Rx for Prolia. [facility], they do not have  Rx on file for Prolia either. [facility].  Will coordinate with PCP to send [facility].  Charlene Brooke, PharmD 09/29/19 5:05 PM

## 2019-09-29 NOTE — Telephone Encounter (Signed)
Patient called following up on the request for Prolia and the status of the PAN foundation covering the medication at [facility]. She said that she spoke to the [employer] regarding this.

## 2019-09-30 MED ORDER — DENOSUMAB 60 MG/ML ~~LOC~~ SOSY: 60.0000 mg | PREFILLED_SYRINGE | Freq: Once | SUBCUTANEOUS | 0 refills | Status: AC

## 2019-09-30 NOTE — Telephone Encounter (Signed)
Ok to order prolia. ?

## 2019-09-30 NOTE — Telephone Encounter (Signed)
yes

## 2019-09-30 NOTE — Telephone Encounter (Signed)
Rx sent. Pt is aware.  °

## 2019-09-30 NOTE — Addendum Note (Signed)
Addended by: Delice Bison E on: 09/30/2019 03:30 PM   Modules accepted: Orders

## 2019-10-05 ENCOUNTER — Telehealth: Payer: Self-pay | Admitting: Internal Medicine

## 2019-10-05 NOTE — Telephone Encounter (Signed)
    Patient calling to discuss Prolia injection.

## 2019-10-05 NOTE — Telephone Encounter (Signed)
Pharmacy called for pt. prolia ICD 10 code and duration. Spoke with Foye Deer phone number is [phone]

## 2019-10-06 NOTE — Telephone Encounter (Signed)
Gave the response to the answer below.

## 2019-10-06 NOTE — Telephone Encounter (Addendum)
Has there been any update on this? I am not sure if this will be mailed to our office or if the patient will need to pick up the medication and bring it with her.

## 2019-10-07 DIAGNOSIS — H5202 Hypermetropia, left eye: Secondary | ICD-10-CM | POA: Diagnosis not present

## 2019-10-07 DIAGNOSIS — Z961 Presence of intraocular lens: Secondary | ICD-10-CM | POA: Diagnosis not present

## 2019-10-07 NOTE — Telephone Encounter (Signed)
Spoke to patient and let her know that [facility] (per other phone note). Once we get the Prolia in, we will call her to schedule.

## 2019-10-16 ENCOUNTER — Other Ambulatory Visit: Payer: Self-pay | Admitting: Internal Medicine

## 2019-10-19 NOTE — Telephone Encounter (Signed)
I have not seen this

## 2019-10-19 NOTE — Telephone Encounter (Signed)
Can you let me know when this arrives and I will call her to schedule.

## 2019-10-19 NOTE — Telephone Encounter (Signed)
    [facility]

## 2019-10-20 ENCOUNTER — Other Ambulatory Visit: Payer: Self-pay

## 2019-10-20 ENCOUNTER — Ambulatory Visit (INDEPENDENT_AMBULATORY_CARE_PROVIDER_SITE_OTHER): Payer: PPO | Admitting: *Deleted

## 2019-10-20 DIAGNOSIS — M810 Age-related osteoporosis without current pathological fracture: Secondary | ICD-10-CM

## 2019-10-20 MED ORDER — DENOSUMAB 60 MG/ML ~~LOC~~ SOSY
60.0000 mg | PREFILLED_SYRINGE | Freq: Once | SUBCUTANEOUS | Status: AC
  Administered 2019-10-20: 60 mg via SUBCUTANEOUS

## 2019-10-20 NOTE — Progress Notes (Signed)
Pls cosign for Prolia inj since PCP is out of the office this afternoon.Marland KitchenJohny Chess

## 2019-11-07 ENCOUNTER — Ambulatory Visit (INDEPENDENT_AMBULATORY_CARE_PROVIDER_SITE_OTHER): Payer: PPO | Admitting: Internal Medicine

## 2019-11-07 ENCOUNTER — Ambulatory Visit (INDEPENDENT_AMBULATORY_CARE_PROVIDER_SITE_OTHER): Payer: PPO

## 2019-11-07 ENCOUNTER — Other Ambulatory Visit: Payer: Self-pay

## 2019-11-07 ENCOUNTER — Encounter: Payer: Self-pay | Admitting: Internal Medicine

## 2019-11-07 VITALS — BP 156/84 | HR 66 | Temp 98.4°F | Resp 14 | Ht 63.0 in | Wt 136.2 lb

## 2019-11-07 DIAGNOSIS — M545 Low back pain, unspecified: Secondary | ICD-10-CM

## 2019-11-07 DIAGNOSIS — K5900 Constipation, unspecified: Secondary | ICD-10-CM | POA: Diagnosis not present

## 2019-11-07 DIAGNOSIS — M5442 Lumbago with sciatica, left side: Secondary | ICD-10-CM | POA: Diagnosis not present

## 2019-11-07 MED ORDER — PREDNISONE 10 MG PO TABS: ORAL_TABLET | ORAL | 0 refills | Status: DC

## 2019-11-07 NOTE — Assessment & Plan Note (Signed)
Acute Bilateral lower back pain, radiation down L leg started today advil has helped Xray today, given OP and h/o compression fx Prednisone taper if no fx - that resolved her pain two years ago Avoid bending, lifting, twisting Call if no improvement

## 2019-11-07 NOTE — Patient Instructions (Addendum)
  Have an xray of your lower back today downstairs.     Medications reviewed and updated.  Changes include :   Prednisone taper once we get the results of the xray.   Your prescription(s) have been submitted to your pharmacy. Please take as directed and contact our office if you believe you are having problem(s) with the medication(s).    Please call if there is no improvement in your symptoms.

## 2019-11-07 NOTE — Assessment & Plan Note (Signed)
Acute on chronic Changed miralax to generic - will go back to trade Continue benefiber Can add stool softener

## 2019-11-07 NOTE — Progress Notes (Signed)
Subjective:    Patient ID: [name] [name], female    DOB: 29-May-1941, 79 y.o.   MRN: [id number]  HPI The patient is here for an acute visit.   Back pain:  "It's out again."  It started feeling bad Thursday, 4 days ago.  She took advil and it helped.  Friday took 2 advil.  Had some constipation x 3 days - ? Related to the advil.  She had a little BM today.   The pain is in her lower back.  Certain ways she moves causes pain and it has gotten worse since it started.  Trying to roll over in bed or getting up from bed causes a lot of pain.     Today it is worse in the right lower back.  No radiation initially - now some radiation down to hip on the right side today.   No leg weakness/N/T.  No loss of bladder or bowel.     She takes miralax daily and benefiber twice daily.   Medications and allergies reviewed with patient and updated if appropriate.  Patient Active Problem List   Diagnosis Date Noted  . Closed compression fracture of lumbar vertebra ([place]), T11, T12, L1 12/30/2017  . Lung nodules, stable, no f/u needed 12/30/2017  . Hypothyroidism 11/01/2016  . Constipation 04/30/2016  . Prediabetes 11/01/2015  . Spinal stenosis of lumbar region 10/03/2015  . DDD (degenerative disc disease), lumbar   . DJD (degenerative joint disease) of knee   . Hypertension 11/25/2010  . History of colon polyps 05/14/2009  . G E R D 08/17/2007  . Hyperlipidemia 08/16/2007  . HIATAL HERNIA 08/16/2007  . DIVERTICULAR DISEASE 08/16/2007  . IBS 08/16/2007  . Osteoporosis 08/16/2007    Current Outpatient Medications on File Prior to Visit  Medication Sig Dispense Refill  . aspirin 81 MG EC tablet Take 81 mg by mouth daily. Ecotrin    . Biotin 5 MG CAPS Taking daily  0  . bisoprolol-hydrochlorothiazide (ZIAC) 5-6.25 MG tablet TAKE 1 TABLET BY MOUTH EVERY DAY 90 tablet 1  . calcium-vitamin D (OSCAL WITH D) 500-200 MG-UNIT per tablet Take 1 tablet by mouth 2 (two) times daily.     . clobetasol  ointment (TEMOVATE) [id number] % Apply 1 application topically 2 (two) times daily.    [name] denosumab (PROLIA) 60 MG/ML SOLN injection Inject 60 mg into the skin every 6 (six) months. Administer in upper arm, thigh, or abdomen    . famotidine (PEPCID) 20 MG tablet Take 20 mg by mouth daily.    [name] ibuprofen (ADVIL,MOTRIN) 200 MG tablet Take 600 mg by mouth 3 (three) times daily. With meals    . levothyroxine (SYNTHROID) 25 MCG tablet TAKE 1 TABLET BY MOUTH EVERY DAY BEFORE BREAKFAST 90 tablet 1  . Multiple Vitamins-Minerals (CENTRUM SILVER PO) Take by mouth daily.      [name] omeprazole (PRILOSEC) 20 MG capsule Take 1 capsule (20 mg total) by mouth daily. 90 capsule 3  . phenylephrine-shark liver oil-mineral oil-petrolatum (PREPARATION H) 0.25-3-14-71.9 % rectal ointment Place 1 application rectally 2 (two) times daily as needed for hemorrhoids.    . polyethylene glycol (MIRALAX / GLYCOLAX) packet Take 17 g by mouth daily. 2 capfuls daily    . pravastatin (PRAVACHOL) 10 MG tablet TAKE 1 TABLET BY MOUTH EVERY DAY 90 tablet 1  . Wheat Dextrin (BENEFIBER) POWD Twice daily  0   No current facility-administered medications on file prior to visit.    Past Medical  History:  Diagnosis Date  . Benign neoplasm of colon   . Cataract    bilateral,had surgery  . DDD (degenerative disc disease), lumbar    MRI w. [facility] [facility] 05/2015, s/p ESI x 1 w/ improvement  . Diaphragmatic hernia without mention of obstruction or gangrene   . Diverticulosis of colon (without mention of hemorrhage)   . DJD (degenerative joint disease) of knee    L>R, IA cortisone by [facility] [facility] in L 03/2015  . Esophageal reflux   . Gallstones   . History of chicken pox   . Hyperlipidemia   . Hypertension   . Irritable bowel syndrome   . Migraines   . Obesity, unspecified   . Osteoporosis, unspecified   . Tachycardia   . Unspecified hemorrhoids with other complication     Past Surgical History:  Procedure Laterality Date  . APPENDECTOMY  1979   . CHOLECYSTECTOMY  1989  . COLONOSCOPY    . POLYPECTOMY    . VAGINAL HYSTERECTOMY  1996    Social History   Socioeconomic History  . Marital status: Married    Spouse name: Not on file  . Number of children: 2  . Years of education: Not on file  . Highest education level: Not on file  Occupational History  . Occupation: retired  Tobacco Use  . Smoking status: Never Smoker  . Smokeless tobacco: Never Used  Substance and Sexual Activity  . Alcohol use: No    Alcohol/week: 0.0 standard drinks  . Drug use: No  . Sexual activity: Not Currently  Other Topics Concern  . Not on file  Social History Narrative   Exercise: no regular exercise   Social Determinants of Health   Financial Resource Strain:   . Difficulty of Paying Living Expenses:   Food Insecurity:   . Worried About Charity fundraiser in the Last Year:   . Arboriculturist in the Last Year:   Transportation Needs:   . Film/video editor (Medical):   [name] Lack of Transportation (Non-Medical):   Physical Activity: Unknown  . Days of Exercise per Week: 0 days  . Minutes of Exercise per Session: Not asked  Stress: No Stress Concern Present  . Feeling of Stress : Only a little  Social Connections:   . Frequency of Communication with Friends and Family:   . Frequency of Social Gatherings with Friends and Family:   . Attends Religious Services:   . Active Member of Clubs or Organizations:   . Attends Archivist Meetings:   [name] Marital Status:     Family History  Problem Relation Age of Onset  . Heart disease Sister        x 2  . Heart disease Brother   . Stroke Mother   . Colon cancer Sister   . Stroke Sister 26    Review of Systems Per HPI    Objective:   Vitals:   11/07/19 1455  BP: (!) 156/84  Pulse: 66  Resp: 14  Temp: 98.4 F (36.9 C)  SpO2: 97%   BP Readings from Last 3 Encounters:  11/07/19 (!) 156/84  08/02/19 (!) 166/84  01/11/19 (!) 152/90   Wt Readings from Last 3  Encounters:  11/07/19 136 lb 3.2 oz (61.8 kg)  08/02/19 133 lb 12.8 oz (60.7 kg)  01/11/19 135 lb 12.8 oz (61.6 kg)   Body mass index is 24.13 kg/m.   Physical Exam Constitutional:      General:  She is not in acute distress.    Appearance: Normal appearance. She is not ill-appearing.  HENT:     Head: Normocephalic and atraumatic.  Musculoskeletal:        General: Tenderness (right lower back, no pain with palpation in left lower back or lumbar spine) present. No swelling or deformity.     Right lower leg: No edema.     Left lower leg: No edema.  Skin:    General: Skin is warm and dry.  Neurological:     Mental Status: She is alert.     Sensory: No sensory deficit.     Motor: No weakness.          Assessment & Plan:    See Problem List for Assessment and Plan of chronic medical problems.    This visit occurred during the SARS-CoV-2 public health emergency.  Safety protocols were in place, including screening questions prior to the visit, additional usage of staff PPE, and extensive cleaning of exam room while observing appropriate contact time as indicated for disinfecting solutions.

## 2019-11-15 ENCOUNTER — Telehealth: Payer: Self-pay | Admitting: Internal Medicine

## 2019-11-15 NOTE — Telephone Encounter (Signed)
Pt would like to see Dr. Tamala Julian. Can you schedule an appointment with him please. Thank you!

## 2019-11-15 NOTE — Telephone Encounter (Signed)
New message:   Pt states she had a X-ray last week and she would like to go over the results again. Pt states she is pain and she will be finishing up the medication today. Please advise.

## 2019-11-15 NOTE — Telephone Encounter (Signed)
If her pain is still bothering her I would like her to see sports medicine again or an orthopedic.

## 2019-11-15 NOTE — Telephone Encounter (Signed)
What would be next step

## 2019-11-16 NOTE — Telephone Encounter (Signed)
Appointment schedule with Dr Georgina Snell.

## 2019-11-18 ENCOUNTER — Ambulatory Visit: Payer: PPO | Admitting: Family Medicine

## 2019-11-18 ENCOUNTER — Encounter: Payer: Self-pay | Admitting: Family Medicine

## 2019-11-18 ENCOUNTER — Other Ambulatory Visit: Payer: Self-pay

## 2019-11-18 VITALS — BP 140/78 | HR 76 | Ht 63.0 in | Wt 135.8 lb

## 2019-11-18 DIAGNOSIS — S39012A Strain of muscle, fascia and tendon of lower back, initial encounter: Secondary | ICD-10-CM

## 2019-11-18 MED ORDER — BACLOFEN 10 MG PO TABS: 5.0000 mg | ORAL_TABLET | Freq: Three times a day (TID) | ORAL | 1 refills | Status: DC | PRN

## 2019-11-18 NOTE — Progress Notes (Signed)
I, Wendy Poet, LAT, ATC, am serving as scribe for Dr. Lynne Leader.  [name] [name] is a 79 y.o. female who presents to [place] at [facility] today for f/u of low back pain, worse on the R.  She was last seen by Dr. Raeford Razor on 12/01/17 for low back pain and was most recently seen by Dr. Quay Burow on 11/07/19 w worsening R-sided LBP.  She was prescribed a steroid dose pack that she has finished.  She currently locates her low back pain to her lower back that is worse on the R.  She rates her pain as severe at it's worst and mild at rest and describes her pain as sharp.   Radiating pain: No LE numbness/tingling: No LE weakness: No Aggravating factors: bed mobility; supine to sit transitions; standing Treatments tried: steroid dose pack; L-spine epidural in 2017; back brace; heat; Advil; Tylenol  Diagnostic imaging: L-spine XR- 11/07/19  Pertinent review of systems: No fevers or chills  Relevant historical information: History of episodes of back pain flareups in the past.  Previously has had MRI at [place] about 20 years ago with epidural steroid injection that has been helpful.   Exam:  BP 140/78 (BP Location: Right Arm, Patient Position: Sitting, Cuff Size: Normal)   Pulse 76   Ht 5\' 3"  (1.6 m)   Wt 135 lb 12.8 oz (61.6 kg)   SpO2 97%   BMI 24.06 kg/m  General: Well Developed, well nourished, and in no acute distress.   MSK: L-spine nontender midline normal-appearing Mildly tender palpation right lumbar paraspinal musculature. Decreased lumbar motion to extension.  Normal flexion.  Rotation limited bilaterally as is lateral flexion limited bilaterally. Motion and strength is intact. Reflexes sensation intact distally.    Lab and Radiology Results DG Lumbar Spine Complete  Result Date: 11/07/2019 CLINICAL DATA:  Low back pain for several days following moving furniture, initial encounter EXAM: [place] 4+ VIEW COMPARISON:   10/03/2015, 04/20/2018 FINDINGS: Five lumbar type vertebral bodies are well visualized. Mild scoliosis concave to the right is noted. Compression fracture at L1 is noted stable from the prior exam. T11 compression fracture is noted which is stable from 2019. Interval T12 compression fracture which appears chronic is noted as well. No pars defects are noted. Mild osteophytic changes are seen. Diffuse aortic calcifications are noted. Disc space narrowing at L4-5 is seen. IMPRESSION: Chronic appearing compression deformities from T11-L1. The T12 compression fracture is new from the prior CT of 2019. Degenerative change. Electronically Signed   By: Inez Catalina M.D.   On: 11/07/2019 16:03   I, Lynne Leader, personally (independently) visualized and performed the interpretation of the images attached in this note.      Assessment and Plan: 79 y.o. female with 2 weeks acute low back pain without radiculopathy.  Patient does have significant degenerative changes on lumbar spine x-ray November 07, 2019.  She notes that in the past with similar episodes she has had pain bad enough that ultimately required epidural steroid injections.  She notes that she has had physical therapy for this in the past with marginal benefit.  Based on current pain complaint I think the most likely explanation is muscle dysfunction and not radiculopathy.  Epidural steroid injection should not be very helpful for this issue however her personal experience is very valid and helpful for this.  Plan for limited trial of physical therapy.  Will prescribe low-dose baclofen for use mostly at bedtime as  well.  Recommend heating pad and TENS unit.  Recheck in 3 to 4 weeks.  If not better at that point would proceed with MRI for injection planning.    Orders Placed This Encounter  Procedures  . Ambulatory referral to Physical Therapy    Referral Priority:   Routine    Referral Type:   Physical Medicine    Referral Reason:   Specialty  Services Required    Requested Specialty:   Physical Therapy   Meds ordered this encounter  Medications  . baclofen (LIORESAL) 10 MG tablet    Sig: Take 0.5-1 tablets (5-10 mg total) by mouth 3 (three) times daily as needed for muscle spasms.    Dispense:  60 each    Refill:  1     Discussed warning signs or symptoms. Please see discharge instructions. Patient expresses understanding.   The above documentation has been reviewed and is accurate and complete Lynne Leader

## 2019-11-18 NOTE — Patient Instructions (Addendum)
Thank you for coming in today.  Plan for baclofen muscle spasm medicine at bedtime. You can take it up to 3x daily but it may make you sleepy.  Use heating pad and TENS unit.   Plan for PT.  Recheck in 3 weeks.  Return or contact me sooner if not doing well.    TENS UNIT: This is helpful for muscle pain and spasm.   Search and Purchase a TENS 7000 2nd edition at  www.tenspros.com or www.[employer].com It should be less than $30.     TENS unit instructions: Do not shower or bathe with the unit on Turn the unit off before removing electrodes or batteries If the electrodes lose stickiness add a drop of water to the electrodes after they are disconnected from the unit and place on plastic sheet. If you continued to have difficulty, call the TENS unit company to purchase more electrodes. Do not apply lotion on the skin area prior to use. Make sure the skin is clean and dry as this will help prolong the life of the electrodes. After use, always check skin for unusual red areas, rash or other skin difficulties. If there are any skin problems, does not apply electrodes to the same area. Never remove the electrodes from the unit by pulling the wires. Do not use the TENS unit or electrodes other than as directed. Do not change electrode placement without consultating your therapist or physician. Keep 2 fingers with between each electrode. Wear time ratio is 2:1, on to off times.    For example on for 30 minutes off for 15 minutes and then on for 30 minutes off for 15 minutes    Lumbosacral Strain Lumbosacral strain is an injury that causes pain in the lower back (lumbosacral spine). This injury usually happens from overstretching the muscles or ligaments along your spine. Ligaments are cord-like tissues that connect bones to other bones. A strain can affect one or more muscles or ligaments. What are the causes? This condition may be caused by:  A hard, direct hit to the  back.  Overstretching the lower back muscles. This may result from: ? A fall. ? Lifting something heavy. ? Repetitive movements such as bending or crouching. What increases the risk? The following factors may make you more likely to develop this condition:  Participating in sports or activities that involve: ? A sudden twist of the back. ? Pushing or pulling motions.  Being overweight or obese.  Having poor strength and flexibility, especially tight hamstrings or weak muscles in the back or abdomen.  Having too much of a curve in the lower back.  Having a pelvis that is tilted forward. What are the signs or symptoms? The main symptom of this condition is pain in the lower back, at the site of the strain. Pain may also be felt down one or both legs. How is this diagnosed? This condition is diagnosed based on your symptoms, your medical history, and a physical exam. During the physical exam, your health care provider may push on certain areas of your back to find the source of your pain. You may be asked to bend forward, backward, and side to side to check your pain and range of motion. You may also have imaging tests, such as X-rays and an MRI. How is this treated? This condition may be treated by:  Applying heat and cold on the affected area.  Taking medicines to help relieve pain and relax your muscles.  Taking NSAIDs, such  as ibuprofen, to help reduce swelling and discomfort.  Doing stretching and strengthening exercises for your lower back. Symptoms usually improve within several weeks of treatment. However, recovery time varies. When your symptoms improve, gradually return to your normal routine as soon as possible to reduce pain, avoid stiffness, and keep muscle strength. Follow these instructions at home: Medicines  Take over-the-counter and prescription medicines only as told by your health care provider.  Ask your health care provider if the medicine prescribed to  you: ? Requires you to avoid driving or using heavy machinery. ? Can cause constipation. You may need to take these actions to prevent or treat constipation:  Drink enough fluid to keep your urine pale yellow.  Take over-the-counter or prescription medicines.  Eat foods that are high in fiber, such as beans, whole grains, and fresh fruits and vegetables.  Limit foods that are high in fat and processed sugars, such as fried or sweet foods. Managing pain, stiffness, and swelling      If directed, put ice on the injured area. To do this: ? Put ice in a plastic bag. ? Place a towel between your skin and the bag. ? Leave the ice on for 20 minutes, 2-3 times a day.  If directed, apply heat on the affected area as often as told by your health care provider. Use the heat source that your health care provider recommends, such as a moist heat pack or a heating pad. ? Place a towel between your skin and the heat source. ? Leave the heat on for 20-30 minutes. ? Remove the heat if your skin turns bright red. This is especially important if you are unable to feel pain, heat, or cold. You may have a greater risk of getting burned. Activity  Rest as told by your health care provider.  Do not stay in bed. Staying in bed for more than 1-2 days can delay your recovery.  Return to your normal activities as told by your health care provider. Ask your health care provider what activities are safe for you.  Avoid activities that take a lot of energy for as long as told by your health care provider.  Do exercises as told by your health care provider. This includes stretching and strengthening exercises. General instructions  Sit up and stand up straight. Avoid leaning forward when you sit, or hunching over when you stand.  Do not use any products that contain nicotine or tobacco, such as cigarettes, e-cigarettes, and chewing tobacco. If you need help quitting, ask your health care provider.  Keep  all follow-up visits as told by your health care provider. This is important. How is this prevented?   Use correct form when playing sports and lifting heavy objects.  Use good posture when sitting and standing.  Maintain a healthy weight.  Sleep on a mattress with medium firmness to support your back.  Do at least 150 minutes of moderate-intensity exercise each week, such as brisk walking or water aerobics. Try a form of exercise that takes stress off your back, such as swimming or stationary cycling.  Maintain physical fitness, including: ? Strength. ? Flexibility. Contact a health care provider if:  Your back pain does not improve after several weeks of treatment.  Your symptoms get worse. Get help right away if:  Your back pain is severe.  You cannot stand or walk.  You have difficulty controlling when you urinate or when you have a bowel movement.  You feel nauseous or  you vomit.  Your feet or legs get very cold, turn pale, or look blue.  You have numbness, tingling, weakness, or problems using your arms or legs.  You develop any of the following: ? Shortness of breath. ? Dizziness. ? Pain in your legs. ? Weakness in your buttocks or legs. Summary  Lumbosacral strain is an injury that causes pain in the lower back (lumbosacral spine).  This injury usually happens from overstretching the muscles or ligaments along your spine.  This condition may be caused by a direct hit to the lower back or by overstretching the lower back muscles.  Symptoms usually improve within several weeks of treatment. This information is not intended to replace advice given to you by your health care provider. Make sure you discuss any questions you have with your health care provider. Document Revised: 12/07/2018 Document Reviewed: 12/07/2018 Elsevier Patient Education  [place].

## 2019-11-25 ENCOUNTER — Other Ambulatory Visit: Payer: Self-pay | Admitting: Internal Medicine

## 2019-11-30 ENCOUNTER — Ambulatory Visit: Payer: PPO | Attending: Family Medicine | Admitting: Physical Therapy

## 2019-11-30 ENCOUNTER — Encounter: Payer: Self-pay | Admitting: Physical Therapy

## 2019-11-30 ENCOUNTER — Other Ambulatory Visit: Payer: Self-pay

## 2019-11-30 DIAGNOSIS — G8929 Other chronic pain: Secondary | ICD-10-CM | POA: Insufficient documentation

## 2019-11-30 DIAGNOSIS — M6281 Muscle weakness (generalized): Secondary | ICD-10-CM | POA: Diagnosis not present

## 2019-11-30 DIAGNOSIS — R293 Abnormal posture: Secondary | ICD-10-CM | POA: Insufficient documentation

## 2019-11-30 DIAGNOSIS — M545 Low back pain: Secondary | ICD-10-CM | POA: Diagnosis not present

## 2019-11-30 NOTE — Therapy (Signed)
[place], [place], [address] Phone: [phone]   Fax:  [phone]  Physical Therapy Evaluation  Patient Details  Name: [name] [name] MRN: [id number] Date of Birth: 07/12/1941 Referring Provider (PT): Gregor Hams, MD   Encounter Date: 11/30/2019  PT End of Session - 11/30/19 1124    Visit Number  1    Number of Visits  16    Date for PT Re-Evaluation  01/25/20    Authorization Type  [employer]    PT Start Time  1105    PT Stop Time  1150    PT Time Calculation (min)  45 min    Activity Tolerance  Patient tolerated treatment well    Behavior During Therapy  [facility] for tasks assessed/performed       Past Medical History:  Diagnosis Date  . Benign neoplasm of colon   . Cataract    bilateral,had surgery  . DDD (degenerative disc disease), lumbar    MRI w. [facility] [facility] 05/2015, s/p ESI x 1 w/ improvement  . Diaphragmatic hernia without mention of obstruction or gangrene   . Diverticulosis of colon (without mention of hemorrhage)   . DJD (degenerative joint disease) of knee    L>R, IA cortisone by [facility] [facility] in L 03/2015  . Esophageal reflux   . Gallstones   . History of chicken pox   . Hyperlipidemia   . Hypertension   . Irritable bowel syndrome   . Migraines   . Obesity, unspecified   . Osteoporosis, unspecified   . Tachycardia   . Unspecified hemorrhoids with other complication     Past Surgical History:  Procedure Laterality Date  . APPENDECTOMY  1979  . CHOLECYSTECTOMY  1989  . COLONOSCOPY    . POLYPECTOMY    . VAGINAL HYSTERECTOMY  1996    There were no vitals filed for this visit.   Subjective Assessment - 11/30/19 1108    Subjective  Patient reports 4 weeks ago tomorrow her back went out. She was placed on prednisone for 8 days and then saw the sports medicine doctor who put her on a muscle relaxer and referred her to therapy. She feels she is slightly better but it is still  bad. She has been wearing a back brace when she is active to give her support. This is about the 3rd or 4th time she has had an episode of low back pain and she typically gets an epidural to help. Pain occurs when she goes to stand and with standing any point of time, and when going to get out of bed will have a pain across the back.    Limitations  Standing;House hold activities;Sitting;Lifting    How long can you sit comfortably?  No limitation    How long can you stand comfortably?  Not comfortably any, can stand ~5 minutes before severe pain    How long can you walk comfortably?  15-20 minutes    Diagnostic tests  X-ray, MRI years ago    Patient Stated Goals  Get rid of pain so she can perform household activities    Currently in Pain?  Yes    Pain Score  5    9/10 at worst   Pain Location  Back    Pain Orientation  Lower   right worse than left   Pain Descriptors / Indicators  Aching;Sharp;Shooting    Pain Type  Chronic pain    Pain Radiating Towards  none this time, has had radiation down left leg in the past    Pain Onset  More than a month ago    Pain Frequency  Constant    Aggravating Factors   Sit>stand, standing for a few minutes    Pain Relieving Factors  Medication, heat/ice, walking    Effect of Pain on Daily Activities  Patient is limited in activities where she is required to stand         [facility] PT Assessment - 11/30/19 0001      Assessment   Medical Diagnosis  Lumbosacral strain    Referring Provider (PT)  Gregor Hams, MD    Onset Date/Surgical Date  11/03/19    Hand Dominance  Right    Next MD Visit  Not currently scheduled    Prior Therapy  Yes - at least once during prior therapy      Precautions   Precautions  None      Restrictions   Weight Bearing Restrictions  No      Balance Screen   Has the patient fallen in the past 6 months  No    Has the patient had a decrease in activity level because of a fear of falling?   No    Is the patient reluctant to  leave their home because of a fear of falling?   No      Home Film/video editor residence    Living Arrangements  Spouse/significant other      Prior Function   Level of Speed  Retired    Leisure  Walking, household tasks      Cognition   Overall Cognitive Status  Within Functional Limits for tasks assessed      Observation/Other Assessments   Observations  Patient appears in no apparent, she is wearing lumbar elastic lumbar support    Focus on Therapeutic Outcomes (FOTO)   72% limitation      Sensation   Light Touch  Appears Intact      Functional Tests   Functional tests  Sit to Stand      Sit to Stand   Comments  Patient able to perform with single UE support, weight shift toward left to reduce pressure on right side, bilat knee valgus      Posture/Postural Control   Posture Comments  Patient exhibits increased lumbar lordosis with increased thoracic kyphosis, rounded shoulder and forward head posture      ROM / Strength   AROM / PROM / Strength  AROM;PROM;Strength      AROM   Overall AROM Comments  Mild right sided low back pain reported in all directions    AROM Assessment Site  Lumbar    Lumbar Flexion  25% - patient able to bend forward but lumbar spine remains in a relatively neutral/lordotic position    Lumbar Extension  75%    Lumbar - Right Side Bend  75%    Lumbar - Left Side Bend  75%    Lumbar - Right Rotation  75%    Lumbar - Left Rotation  75%      PROM   Overall PROM Comments  Hip PROM grossly WFL with increased stretch reported on right side    PROM Assessment Site  Hip    Right/Left Hip  Right;Left    Right Hip Extension  --   reports mild right sided low back discomfort   Right  Hip Flexion  --   reports mild right low back pulling     Strength   Strength Assessment Site  Hip;Knee;Ankle    Right/Left Hip  Right;Left    Right Hip Flexion  4-/5    Right Hip Extension  4-/5    Right Hip  ABduction  3+/5    Left Hip Flexion  4-/5    Left Hip Extension  4-/5    Left Hip ABduction  3+/5    Right/Left Knee  Right;Left    Right Knee Flexion  4/5    Right Knee Extension  4/5    Left Knee Flexion  4/5    Left Knee Extension  4/5    Right/Left Ankle  Right;Left    Right Ankle Dorsiflexion  5/5    Left Ankle Dorsiflexion  5/5      Flexibility   Soft Tissue Assessment /Muscle Length  yes    Hamstrings  Slightly limited bilaterally, greater on right    Quadriceps  Limited bilaterally   hip flexor   Piriformis  Slightly limited bilaterally      Palpation   Spinal mobility  Not assessed     SI assessment   Not assessed    Palpation comment  Mild TTP to right lumbar paraspinals      Special Tests    Special Tests  Lumbar    Lumbar Tests  Slump Test;Straight Leg Raise      Slump test   Findings  Negative      Straight Leg Raise   Findings  Negative      Transfers   Transfers  Independent with all Transfers      Ambulation/Gait   Ambulation/Gait  No                Objective measurements completed on examination: See above findings.      [place] Adult PT Treatment/Exercise - 11/30/19 0001      Exercises   Exercises  Lumbar      Lumbar Exercises: Stretches   Passive Hamstring Stretch  2 reps;20 seconds    Passive Hamstring Stretch Limitations  seated edge of mat    Single Knee to Chest Stretch  2 reps;20 seconds    Single Knee to Chest Stretch Limitations  supine    Lower Trunk Rotation Limitations  5 sec x10 each    Pelvic Tilt  10 reps    Piriformis Stretch  2 reps;20 seconds    Piriformis Stretch Limitations  supine    Other Lumbar Stretch Exercise  Child's pose stretch focusing on right low back 2x20 sec - placed pillows under hips to reduce stress on knees      Lumbar Exercises: Supine   Bridge  10 reps    Bridge Limitations  cued for posterior pelvic tilt and glute activation      Lumbar Exercises: Sidelying   Clam  15 reps              PT Education - 11/30/19 1123    Education Details  Exam findings, POC, HEP    Person(s) Educated  Patient    Methods  Explanation;Demonstration;Tactile cues;Verbal cues;Handout    Comprehension  Verbalized understanding;Returned demonstration;Verbal cues required;Tactile cues required;Need further instruction       PT Short Term Goals - 11/30/19 1125      PT SHORT TERM GOAL #1   Title  Patient will be I with initial HEP to progress with PT    Time  4    Period  Weeks    Status  New    Target Date  12/28/19      PT SHORT TERM GOAL #2   Title  Patient will be able to perform usual housework with moderate or less difficulty    Time  4    Period  Weeks    Status  New    Target Date  12/28/19      PT SHORT TERM GOAL #3   Title  Patient exhibit proper log roll technique for bed mobility and report a little bit or less difficulty getting out of bed    Time  4    Period  Weeks    Status  New    Target Date  12/28/19      PT SHORT TERM GOAL #4   Title  Patient will report improved pain with activity to </= 4/10 to improve functional ability    Time  4    Period  Weeks    Status  New    Target Date  12/28/19        PT Long Term Goals - 11/30/19 1125      PT LONG TERM GOAL #1   Title  Patient will be I with final HEP to maintain progress from PT    Time  8    Period  Weeks    Status  New    Target Date  01/25/20      PT LONG TERM GOAL #2   Title  Patient will report improved functional level to </= 50% limitation on FOTO    Time  8    Period  Weeks    Status  New    Target Date  01/25/20      PT LONG TERM GOAL #3   Title  Patient will report no limitation with standing or walking >/= 1 hour to improve grocery shopping ability    Time  8    Period  Weeks    Status  New    Target Date  01/25/20      PT LONG TERM GOAL #4   Title  Patient will report no difficulty with any bathing or dressing tasks to improve self care ability    Time  8    Period   Weeks    Status  New    Target Date  01/25/20             Plan - 11/30/19 1126    Clinical Impression Statement  Patient presents to PT with report of acute on chronic right sided low back pain without radiation to LE. She exhibits primary limitations in lumbar mobility with core strength deficits that result in poor movement mechanics and postural control with static postures. She would benefit from continued skilled PT to progress her mobility and strength to reduce pain and return to previous level of activities such as household tasks without limitation.    Personal Factors and Comorbidities  Age;Fitness;Past/Current Experience;Time since onset of injury/illness/exacerbation    Examination-Activity Limitations  Bed Mobility;Bend;Carry;Stand;Lift;Locomotion Level;Squat;Stairs;Bathing;Dressing    Examination-Participation Restrictions  Meal Prep;Cleaning;Community Activity;Shop;Laundry;Yard Work    Stability/Clinical Decision Making  Stable/Uncomplicated    Clinical Decision Making  Low    Rehab Potential  Good    PT Frequency  2x / week    PT Duration  8 weeks    PT Treatment/Interventions  ADLs/Self Care Home Management;Cryotherapy;Electrical Stimulation;Moist Heat;Ultrasound;Neuromuscular re-education;Balance training;Therapeutic exercise;Therapeutic activities;Functional mobility training;Stair training;Gait training;Patient/family  education;Manual techniques;Dry needling;Passive range of motion;Taping;Spinal Manipulations;Joint Manipulations    PT Next Visit Plan  Assess HEP and progress PRN, manual and modalities PRN for pain relief, continue lumbar and hip mobility, core and hip strengthening    PT Home Exercise Plan  [id number]: seated hamstring stretch, supine single knee to chest stretch, supine piriformis stretch, LTR, supine pelvic tilt, bridge, side clamshell, child's pose stretch to side (pillow under hips)    Consulted and Agree with Plan of Care  Patient       Patient  will benefit from skilled therapeutic intervention in order to improve the following deficits and impairments:  Decreased range of motion, Decreased activity tolerance, Pain, Decreased strength, Postural dysfunction, Impaired flexibility, Improper body mechanics, Difficulty walking  Visit Diagnosis: Chronic right-sided low back pain, unspecified whether sciatica present  Muscle weakness (generalized)  Abnormal posture     Problem List Patient Active Problem List   Diagnosis Date Noted  . Lower back pain 11/07/2019  . Closed compression fracture of lumbar vertebra ([place]), T11, T12, L1 12/30/2017  . Lung nodules, stable, no f/u needed 12/30/2017  . Hypothyroidism 11/01/2016  . Constipation 04/30/2016  . Prediabetes 11/01/2015  . Spinal stenosis of lumbar region 10/03/2015  . DDD (degenerative disc disease), lumbar   . DJD (degenerative joint disease) of knee   . Hypertension 11/25/2010  . History of colon polyps 05/14/2009  . G E R D 08/17/2007  . Hyperlipidemia 08/16/2007  . HIATAL HERNIA 08/16/2007  . DIVERTICULAR DISEASE 08/16/2007  . IBS 08/16/2007  . Osteoporosis 08/16/2007    Hilda Blades, PT, DPT, LAT, ATC 11/30/19  12:41 PM Phone: [phone] Fax: [place] [facility] [address] [place], [place], [address] Phone: [phone]   Fax:  [phone]  Name: KAJOL BURDA MRN: [id number] Date of Birth: [birth year]

## 2019-11-30 NOTE — Patient Instructions (Signed)
Access Code: [email or web address] URL: [email or web address] Date: 11/30/2019 Prepared by: Hilda Blades  Exercises Seated Hamstring Stretch - 1-2 x daily - 7 x weekly - 2 reps - 15 seconds hold Supine Single Knee to Chest - 1-2 x daily - 7 x weekly - 2 reps - 15 seconds hold Supine Piriformis Stretch - 1-2 x daily - 7 x weekly - 2 reps - 15 seconds hold Supine Lower Trunk Rotation - 1-2 x daily - 7 x weekly - 10 reps - 5 seconds hold Supine Pelvic Tilt - 1-2 x daily - 7 x weekly - 10 reps Bridge - 1-2 x daily - 7 x weekly - 10 reps Clamshell - 1-2 x daily - 7 x weekly - 15 reps Child's Pose with Sidebending - 1-2 x daily - 7 x weekly - 2 reps - 15 seconds hold

## 2019-12-08 ENCOUNTER — Ambulatory Visit: Payer: PPO | Admitting: Physical Therapy

## 2019-12-08 ENCOUNTER — Encounter: Payer: Self-pay | Admitting: Physical Therapy

## 2019-12-08 ENCOUNTER — Other Ambulatory Visit: Payer: Self-pay

## 2019-12-08 DIAGNOSIS — G8929 Other chronic pain: Secondary | ICD-10-CM

## 2019-12-08 DIAGNOSIS — M545 Low back pain: Secondary | ICD-10-CM | POA: Diagnosis not present

## 2019-12-08 DIAGNOSIS — R293 Abnormal posture: Secondary | ICD-10-CM

## 2019-12-08 DIAGNOSIS — M6281 Muscle weakness (generalized): Secondary | ICD-10-CM

## 2019-12-08 NOTE — Therapy (Signed)
[place], [place], [address] Phone: [phone]   Fax:  [phone]  Physical Therapy Treatment  Patient Details  Name: [name] [name] MRN: [id number] Date of Birth: 1941/07/01 Referring Provider (PT): Gregor Hams, MD   Encounter Date: 12/08/2019  PT End of Session - 12/08/19 1500    Visit Number  2    Number of Visits  16    Date for PT Re-Evaluation  01/25/20    Authorization Type  [employer]    PT Start Time  1459    PT Stop Time  1543    PT Time Calculation (min)  44 min    Activity Tolerance  Patient tolerated treatment well    Behavior During Therapy  [facility] for tasks assessed/performed       Past Medical History:  Diagnosis Date  . Benign neoplasm of colon   . Cataract    bilateral,had surgery  . DDD (degenerative disc disease), lumbar    MRI w. [place] [facility] 05/2015, s/p ESI x 1 w/ improvement  . Diaphragmatic hernia without mention of obstruction or gangrene   . Diverticulosis of colon (without mention of hemorrhage)   . DJD (degenerative joint disease) of knee    L>R, IA cortisone by [place] [facility] in L 03/2015  . Esophageal reflux   . Gallstones   . History of chicken pox   . Hyperlipidemia   . Hypertension   . Irritable bowel syndrome   . Migraines   . Obesity, unspecified   . Osteoporosis, unspecified   . Tachycardia   . Unspecified hemorrhoids with other complication     Past Surgical History:  Procedure Laterality Date  . APPENDECTOMY  1979  . CHOLECYSTECTOMY  1989  . COLONOSCOPY    . POLYPECTOMY    . VAGINAL HYSTERECTOMY  1996    There were no vitals filed for this visit.  Subjective Assessment - 12/08/19 1500    Subjective  Worse on Rt side today and worse when standing. Much better getting out of bed.                        [place] Adult PT Treatment/Exercise - 12/08/19 0001      Lumbar Exercises: Stretches   Passive Hamstring Stretch Limitations   seated edge of bed    Gastroc Stretch Limitations  standing    Other Lumbar Stretch Exercise  sink flexion stretch      Lumbar Exercises: Standing   Heel Raises  10 reps   3 sets   Heel Raises Limitations  fingers on bolster    Functional Squats Limitations  at sink    Other Standing Lumbar Exercises  wobble board A/P static & dynamic, single UE support      Lumbar Exercises: Seated   Sit to Stand Limitations  x10 ball bw knees      Lumbar Exercises: Supine   AB Set Limitations  ab set with ball squeeze    Bridge with Diona Foley Squeeze Limitations  cues for ab set, 10x      Manual Therapy   Manual Therapy  Soft tissue mobilization    Manual therapy comments  resisted isometric Rt hip flexion 5x5s    Soft tissue mobilization  Rt hip in sidelying             PT Education - 12/08/19 1546    Education Details  home TENS    Person(s) Educated  Patient    Methods  Explanation;Demonstration;Tactile cues;Verbal cues;Handout    Comprehension  Verbalized understanding;Returned demonstration;Verbal cues required;Tactile cues required;Need further instruction       PT Short Term Goals - 11/30/19 1125      PT SHORT TERM GOAL #1   Title  Patient will be I with initial HEP to progress with PT    Time  4    Period  Weeks    Status  New    Target Date  12/28/19      PT SHORT TERM GOAL #2   Title  Patient will be able to perform usual housework with moderate or less difficulty    Time  4    Period  Weeks    Status  New    Target Date  12/28/19      PT SHORT TERM GOAL #3   Title  Patient exhibit proper log roll technique for bed mobility and report a little bit or less difficulty getting out of bed    Time  4    Period  Weeks    Status  New    Target Date  12/28/19      PT SHORT TERM GOAL #4   Title  Patient will report improved pain with activity to </= 4/10 to improve functional ability    Time  4    Period  Weeks    Status  New    Target Date  12/28/19        PT  Long Term Goals - 11/30/19 1125      PT LONG TERM GOAL #1   Title  Patient will be I with final HEP to maintain progress from PT    Time  8    Period  Weeks    Status  New    Target Date  01/25/20      PT LONG TERM GOAL #2   Title  Patient will report improved functional level to </= 50% limitation on FOTO    Time  8    Period  Weeks    Status  New    Target Date  01/25/20      PT LONG TERM GOAL #3   Title  Patient will report no limitation with standing or walking >/= 1 hour to improve grocery shopping ability    Time  8    Period  Weeks    Status  New    Target Date  01/25/20      PT LONG TERM GOAL #4   Title  Patient will report no difficulty with any bathing or dressing tasks to improve self care ability    Time  8    Period  Weeks    Status  New    Target Date  01/25/20            Plan - 12/08/19 1543    Clinical Impression Statement  Notable lack of core endurance in standing exercises. Will continue to benefit from training. Was able to engage her core in standing but fatigued and began feeling discomfort but reports it is not as bad as it was.    PT Treatment/Interventions  ADLs/Self Care Home Management;Cryotherapy;Electrical Stimulation;Moist Heat;Ultrasound;Neuromuscular re-education;Balance training;Therapeutic exercise;Therapeutic activities;Functional mobility training;Stair training;Gait training;Patient/family education;Manual techniques;Dry needling;Passive range of motion;Taping;Spinal Manipulations;Joint Manipulations    PT Next Visit Plan  progress CKC strength with core engagement    PT Home Exercise Plan  [id number]: seated hamstring stretch, supine single knee to chest stretch, supine  piriformis stretch, LTR, supine pelvic tilt, bridge, side clamshell, child's pose stretch to side (pillow under hips), standing core engagement, heel raises, gastroc stretch, squats at sink    Consulted and Agree with Plan of Care  Patient       Patient will benefit  from skilled therapeutic intervention in order to improve the following deficits and impairments:  Decreased range of motion, Decreased activity tolerance, Pain, Decreased strength, Postural dysfunction, Impaired flexibility, Improper body mechanics, Difficulty walking  Visit Diagnosis: Muscle weakness (generalized)  Abnormal posture  Chronic right-sided low back pain, unspecified whether sciatica present     Problem List Patient Active Problem List   Diagnosis Date Noted  . Lower back pain 11/07/2019  . Closed compression fracture of lumbar vertebra ([place]), T11, T12, L1 12/30/2017  . Lung nodules, stable, no f/u needed 12/30/2017  . Hypothyroidism 11/01/2016  . Constipation 04/30/2016  . Prediabetes 11/01/2015  . Spinal stenosis of lumbar region 10/03/2015  . DDD (degenerative disc disease), lumbar   . DJD (degenerative joint disease) of knee   . Hypertension 11/25/2010  . History of colon polyps 05/14/2009  . G E R D 08/17/2007  . Hyperlipidemia 08/16/2007  . HIATAL HERNIA 08/16/2007  . DIVERTICULAR DISEASE 08/16/2007  . IBS 08/16/2007  . Osteoporosis 08/16/2007    Jeanne Diefendorf C. Scottlynn Lindell PT, DPT 12/08/19 3:47 PM   [facility] [address] [place], [place], [address] Phone: [phone]   Fax:  [phone]  Name: [name] [name] MRN: [id number] Date of Birth: 1941-02-01

## 2019-12-12 ENCOUNTER — Telehealth: Payer: Self-pay | Admitting: Physical Therapy

## 2019-12-12 NOTE — Telephone Encounter (Signed)
Attempted to contact pt for appointment this week. VM not set up on either number.  Jessica C. Hightower PT, DPT 12/12/19 5:26 PM

## 2019-12-16 ENCOUNTER — Encounter: Payer: Self-pay | Admitting: Physical Therapy

## 2019-12-16 ENCOUNTER — Other Ambulatory Visit: Payer: Self-pay

## 2019-12-16 ENCOUNTER — Ambulatory Visit: Payer: PPO | Admitting: Physical Therapy

## 2019-12-16 DIAGNOSIS — M6281 Muscle weakness (generalized): Secondary | ICD-10-CM

## 2019-12-16 DIAGNOSIS — G8929 Other chronic pain: Secondary | ICD-10-CM

## 2019-12-16 DIAGNOSIS — R293 Abnormal posture: Secondary | ICD-10-CM

## 2019-12-16 DIAGNOSIS — M545 Low back pain: Secondary | ICD-10-CM | POA: Diagnosis not present

## 2019-12-16 NOTE — Therapy (Signed)
[place], [place], [address] Phone: [phone]   Fax:  [phone]  Physical Therapy Treatment  Patient Details  Name: [name] [name] MRN: [id number] Date of Birth: Jun 14, 1941 Referring Provider (PT): Gregor Hams, MD   Encounter Date: 12/16/2019  PT End of Session - 12/16/19 1235    Visit Number  3    Number of Visits  16    Date for PT Re-Evaluation  01/25/20    Authorization Type  [employer]    PT Start Time  [id number]    PT Stop Time  1229    PT Time Calculation (min)  44 min    Activity Tolerance  Patient tolerated treatment well    Behavior During Therapy  [facility] for tasks assessed/performed       Past Medical History:  Diagnosis Date  . Benign neoplasm of colon   . Cataract    bilateral,had surgery  . DDD (degenerative disc disease), lumbar    MRI w. [facility] [facility] 05/2015, s/p ESI x 1 w/ improvement  . Diaphragmatic hernia without mention of obstruction or gangrene   . Diverticulosis of colon (without mention of hemorrhage)   . DJD (degenerative joint disease) of knee    L>R, IA cortisone by [facility] [facility] in L 03/2015  . Esophageal reflux   . Gallstones   . History of chicken pox   . Hyperlipidemia   . Hypertension   . Irritable bowel syndrome   . Migraines   . Obesity, unspecified   . Osteoporosis, unspecified   . Tachycardia   . Unspecified hemorrhoids with other complication     Past Surgical History:  Procedure Laterality Date  . APPENDECTOMY  1979  . CHOLECYSTECTOMY  1989  . COLONOSCOPY    . POLYPECTOMY    . VAGINAL HYSTERECTOMY  1996    There were no vitals filed for this visit.  Subjective Assessment - 12/16/19 1148    Subjective  I am gradually getting better. In the mornings I am okay but the more I stand the more it hurts.    Patient Stated Goals  Get rid of pain so she can perform household activities    Currently in Pain?  Yes    Pain Score  --   a little bit   Pain Location  Back    Pain Orientation  Right;Lower    Pain Descriptors / Indicators  Sharp    Aggravating Factors   standing         [facility] PT Assessment - 12/16/19 0001      Sit to Stand   Comments  5TSTS 16s                    [facility] Adult PT Treatment/Exercise - 12/16/19 0001      Lumbar Exercises: Stretches   Passive Hamstring Stretch Limitations  seated edge of bed    Piriformis Stretch Limitations  supine & seated after manual & throughout session      Lumbar Exercises: Standing   Heel Raises  20 reps    Functional Squats Limitations  table taps with UE on chair    Lifting Limitations  dead lift 10lb from 8 in step    Other Standing Lumbar Exercises  walking around gym 10lb each hand 3 laps      Lumbar Exercises: Seated   Other Seated Lumbar Exercises  UE horiz abd green tband    Other Seated Lumbar Exercises  UE ER green tband               PT Short Term Goals - 11/30/19 1125      PT SHORT TERM GOAL #1   Title  Patient will be I with initial HEP to progress with PT    Time  4    Period  Weeks    Status  New    Target Date  12/28/19      PT SHORT TERM GOAL #2   Title  Patient will be able to perform usual housework with moderate or less difficulty    Time  4    Period  Weeks    Status  New    Target Date  12/28/19      PT SHORT TERM GOAL #3   Title  Patient exhibit proper log roll technique for bed mobility and report a little bit or less difficulty getting out of bed    Time  4    Period  Weeks    Status  New    Target Date  12/28/19      PT SHORT TERM GOAL #4   Title  Patient will report improved pain with activity to </= 4/10 to improve functional ability    Time  4    Period  Weeks    Status  New    Target Date  12/28/19        PT Long Term Goals - 11/30/19 1125      PT LONG TERM GOAL #1   Title  Patient will be I with final HEP to maintain progress from PT    Time  8    Period  Weeks    Status  New    Target Date   01/25/20      PT LONG TERM GOAL #2   Title  Patient will report improved functional level to </= 50% limitation on FOTO    Time  8    Period  Weeks    Status  New    Target Date  01/25/20      PT LONG TERM GOAL #3   Title  Patient will report no limitation with standing or walking >/= 1 hour to improve grocery shopping ability    Time  8    Period  Weeks    Status  New    Target Date  01/25/20      PT LONG TERM GOAL #4   Title  Patient will report no difficulty with any bathing or dressing tasks to improve self care ability    Time  8    Period  Weeks    Status  New    Target Date  01/25/20            Plan - 12/16/19 1235    Clinical Impression Statement  Increased challenge to squat and added weight to walking and lifting with emphasis on upright posture through upper body. Added scapular retraction strengthening to HEP. Advised her that it is ok to wear back brace while doing activities such as vaccuuming and yard work but then she needs to follow those activities with stretches and exercises as well as take breaks for stretching occasionally within the activity.    PT Treatment/Interventions  ADLs/Self Care Home Management;Cryotherapy;Electrical Stimulation;Moist Heat;Ultrasound;Neuromuscular re-education;Balance training;Therapeutic exercise;Therapeutic activities;Functional mobility training;Stair training;Gait training;Patient/family education;Manual techniques;Dry needling;Passive range of motion;Taping;Spinal Manipulations;Joint Manipulations    PT Next Visit Plan  progress CKC strength with core engagement, weight  with lifting/bending    PT Home Exercise Plan  [id number]: seated hamstring stretch, supine single knee to chest stretch, supine piriformis stretch, LTR, supine pelvic tilt, bridge, side clamshell, child's pose stretch to side (pillow under hips), standing core engagement, heel raises, gastroc stretch, squats at sink    Consulted and Agree with Plan of Care   Patient       Patient will benefit from skilled therapeutic intervention in order to improve the following deficits and impairments:  Decreased range of motion, Decreased activity tolerance, Pain, Decreased strength, Postural dysfunction, Impaired flexibility, Improper body mechanics, Difficulty walking  Visit Diagnosis: Muscle weakness (generalized)  Abnormal posture  Chronic right-sided low back pain, unspecified whether sciatica present     Problem List Patient Active Problem List   Diagnosis Date Noted  . Lower back pain 11/07/2019  . Closed compression fracture of lumbar vertebra ([place]), T11, T12, L1 12/30/2017  . Lung nodules, stable, no f/u needed 12/30/2017  . Hypothyroidism 11/01/2016  . Constipation 04/30/2016  . Prediabetes 11/01/2015  . Spinal stenosis of lumbar region 10/03/2015  . DDD (degenerative disc disease), lumbar   . DJD (degenerative joint disease) of knee   . Hypertension 11/25/2010  . History of colon polyps 05/14/2009  . G E R D 08/17/2007  . Hyperlipidemia 08/16/2007  . HIATAL HERNIA 08/16/2007  . DIVERTICULAR DISEASE 08/16/2007  . IBS 08/16/2007  . Osteoporosis 08/16/2007    Ivet Guerrieri C. Twylia Oka PT, DPT 12/16/19 12:39 PM   [facility] [address] [place], [place], [address] Phone: [phone]   Fax:  [phone]  Name: [name] [name] MRN: [id number] Date of Birth: May 01, 1941

## 2019-12-19 ENCOUNTER — Encounter: Payer: Self-pay | Admitting: Physical Therapy

## 2019-12-19 ENCOUNTER — Ambulatory Visit: Payer: PPO | Admitting: Physical Therapy

## 2019-12-19 ENCOUNTER — Other Ambulatory Visit: Payer: Self-pay

## 2019-12-19 DIAGNOSIS — M6281 Muscle weakness (generalized): Secondary | ICD-10-CM

## 2019-12-19 DIAGNOSIS — M545 Low back pain, unspecified: Secondary | ICD-10-CM

## 2019-12-19 DIAGNOSIS — R293 Abnormal posture: Secondary | ICD-10-CM

## 2019-12-19 NOTE — Therapy (Signed)
[place], [place], [address] Phone: [phone]   Fax:  [phone]  Physical Therapy Treatment  Patient Details  Name: [name] [name] MRN: [id number] Date of Birth: [birth year] Referring Provider (PT): Gregor Hams, MD   Encounter Date: 12/19/2019  PT End of Session - 12/19/19 1148    Visit Number  4    Number of Visits  16    Date for PT Re-Evaluation  01/25/20    Authorization Type  [employer]    PT Start Time  1135    PT Stop Time  1215    PT Time Calculation (min)  40 min    Activity Tolerance  Patient tolerated treatment well    Behavior During Therapy  [facility] for tasks assessed/performed       Past Medical History:  Diagnosis Date  . Benign neoplasm of colon   . Cataract    bilateral,had surgery  . DDD (degenerative disc disease), lumbar    MRI w. [facility] [facility] 05/2015, s/p ESI x 1 w/ improvement  . Diaphragmatic hernia without mention of obstruction or gangrene   . Diverticulosis of colon (without mention of hemorrhage)   . DJD (degenerative joint disease) of knee    L>R, IA cortisone by [facility] [facility] in L 03/2015  . Esophageal reflux   . Gallstones   . History of chicken pox   . Hyperlipidemia   . Hypertension   . Irritable bowel syndrome   . Migraines   . Obesity, unspecified   . Osteoporosis, unspecified   . Tachycardia   . Unspecified hemorrhoids with other complication     Past Surgical History:  Procedure Laterality Date  . APPENDECTOMY  1979  . CHOLECYSTECTOMY  1989  . COLONOSCOPY    . POLYPECTOMY    . VAGINAL HYSTERECTOMY  1996    There were no vitals filed for this visit.  Subjective Assessment - 12/19/19 1145    Subjective  Patient reports she continues to have pain on the right side when she is standing. She is gradually improving overall. Activities around home are getting easier.    Patient Stated Goals  Get rid of pain so she can perform household activities     Currently in Pain?  Yes    Pain Score  2     Pain Location  Back    Pain Orientation  Right;Lower    Pain Descriptors / Indicators  Aching    Pain Type  Chronic pain    Pain Onset  More than a month ago    Pain Frequency  Intermittent    Aggravating Factors   Standing         [facility] PT Assessment - 12/19/19 0001      Strength   Right Hip Extension  4-/5    Right Hip ABduction  4-/5    Left Hip Extension  4-/5    Left Hip ABduction  4-/5                    [facility] Adult PT Treatment/Exercise - 12/19/19 0001      Exercises   Exercises  Lumbar      Lumbar Exercises: Stretches   Lower Trunk Rotation  5 reps;10 seconds    Lower Trunk Rotation Limitations  figure 4 position    Piriformis Stretch  2 reps;20 seconds    Piriformis Stretch Limitations  supine      Lumbar Exercises: Aerobic  Nustep  L4 x6 min (LE only)      Lumbar Exercises: Standing   Lifting Limitations  Dead lift with 10# from 6" step 2x10    Other Standing Lumbar Exercises  Row with green band x20      Lumbar Exercises: Seated   Sit to Stand  10 reps   2 sets   Sit to Stand Limitations  holding 5# kettlebell at chest      Lumbar Exercises: Supine   Bridge  10 reps;3 seconds      Lumbar Exercises: Sidelying   Hip Abduction  10 reps   2 sets            PT Education - 12/19/19 1147    Education Details  HEP    Person(s) Educated  Patient    Methods  Explanation;Demonstration;Verbal cues    Comprehension  Verbalized understanding;Returned demonstration;Verbal cues required;Need further instruction       PT Short Term Goals - 11/30/19 1125      PT SHORT TERM GOAL #1   Title  Patient will be I with initial HEP to progress with PT    Time  4    Period  Weeks    Status  New    Target Date  12/28/19      PT SHORT TERM GOAL #2   Title  Patient will be able to perform usual housework with moderate or less difficulty    Time  4    Period  Weeks    Status  New    Target Date   12/28/19      PT SHORT TERM GOAL #3   Title  Patient exhibit proper log roll technique for bed mobility and report a little bit or less difficulty getting out of bed    Time  4    Period  Weeks    Status  New    Target Date  12/28/19      PT SHORT TERM GOAL #4   Title  Patient will report improved pain with activity to </= 4/10 to improve functional ability    Time  4    Period  Weeks    Status  New    Target Date  12/28/19        PT Long Term Goals - 11/30/19 1125      PT LONG TERM GOAL #1   Title  Patient will be I with final HEP to maintain progress from PT    Time  8    Period  Weeks    Status  New    Target Date  01/25/20      PT LONG TERM GOAL #2   Title  Patient will report improved functional level to </= 50% limitation on FOTO    Time  8    Period  Weeks    Status  New    Target Date  01/25/20      PT LONG TERM GOAL #3   Title  Patient will report no limitation with standing or walking >/= 1 hour to improve grocery shopping ability    Time  8    Period  Weeks    Status  New    Target Date  01/25/20      PT LONG TERM GOAL #4   Title  Patient will report no difficulty with any bathing or dressing tasks to improve self care ability    Time  8    Period  Weeks  Status  New    Target Date  01/25/20            Plan - 12/19/19 1150    Clinical Impression Statement  Patient tolerated therapy well with no adverse effects. Strengthening exercises were progressed this visit with good tolerance, she does report minor right sided lower back discomfort with exercise but this resolves with rest. She is performing greater levels of activity at home with less pain. She would benefit from continued skilled PT to progress strength and tolerance with activity so she can reduce pain and limitation.    PT Treatment/Interventions  ADLs/Self Care Home Management;Cryotherapy;Electrical Stimulation;Moist Heat;Ultrasound;Neuromuscular re-education;Balance  training;Therapeutic exercise;Therapeutic activities;Functional mobility training;Stair training;Gait training;Patient/family education;Manual techniques;Dry needling;Passive range of motion;Taping;Spinal Manipulations;Joint Manipulations    PT Next Visit Plan  progress CKC strength with core engagement, weight with lifting/bending    PT Home Exercise Plan  [id number]: seated hamstring stretch, supine single knee to chest stretch, supine piriformis stretch, LTR, supine pelvic tilt, bridge, side clamshell, child's pose stretch to side (pillow under hips), standing core engagement, heel raises, gastroc stretch, squats at sink, banded ER and horiz abd    Consulted and Agree with Plan of Care  Patient       Patient will benefit from skilled therapeutic intervention in order to improve the following deficits and impairments:  Decreased range of motion, Decreased activity tolerance, Pain, Decreased strength, Postural dysfunction, Impaired flexibility, Improper body mechanics, Difficulty walking  Visit Diagnosis: Muscle weakness (generalized)  Abnormal posture  Chronic right-sided low back pain, unspecified whether sciatica present     Problem List Patient Active Problem List   Diagnosis Date Noted  . Lower back pain 11/07/2019  . Closed compression fracture of lumbar vertebra ([place]), T11, T12, L1 12/30/2017  . Lung nodules, stable, no f/u needed 12/30/2017  . Hypothyroidism 11/01/2016  . Constipation 04/30/2016  . Prediabetes 11/01/2015  . Spinal stenosis of lumbar region 10/03/2015  . DDD (degenerative disc disease), lumbar   . DJD (degenerative joint disease) of knee   . Hypertension 11/25/2010  . History of colon polyps 05/14/2009  . G E R D 08/17/2007  . Hyperlipidemia 08/16/2007  . HIATAL HERNIA 08/16/2007  . DIVERTICULAR DISEASE 08/16/2007  . IBS 08/16/2007  . Osteoporosis 08/16/2007    Hilda Blades, PT, DPT, LAT, ATC 12/19/19  12:35 PM Phone: [phone] Fax:  [facility] [address] [place], [place], [address] Phone: [phone]   Fax:  [phone]  Name: [name] [name] MRN: [id number] Date of Birth: Mar 27, 1941

## 2019-12-21 ENCOUNTER — Encounter: Payer: Self-pay | Admitting: Physical Therapy

## 2019-12-21 ENCOUNTER — Other Ambulatory Visit: Payer: Self-pay

## 2019-12-21 ENCOUNTER — Ambulatory Visit: Payer: PPO | Admitting: Physical Therapy

## 2019-12-21 DIAGNOSIS — R293 Abnormal posture: Secondary | ICD-10-CM

## 2019-12-21 DIAGNOSIS — G8929 Other chronic pain: Secondary | ICD-10-CM

## 2019-12-21 DIAGNOSIS — M6281 Muscle weakness (generalized): Secondary | ICD-10-CM

## 2019-12-21 DIAGNOSIS — M545 Low back pain: Secondary | ICD-10-CM | POA: Diagnosis not present

## 2019-12-21 NOTE — Patient Instructions (Signed)
Access Code: [email or web address] URL: [email or web address] Date: 11/30/2019 Prepared by: Hilda Blades  Exercises Seated Hamstring Stretch - 1-2 x daily - 7 x weekly - 2 reps - 15 seconds hold Supine Single Knee to Chest - 1-2 x daily - 7 x weekly - 2 reps - 15 seconds hold Supine Piriformis Stretch - 1-2 x daily - 7 x weekly - 2 reps - 15 seconds hold Supine Lower Trunk Rotation - 1-2 x daily - 7 x weekly - 10 reps - 5 seconds hold Supine Pelvic Tilt - 1-2 x daily - 7 x weekly - 10 reps Marching Bridge - 1-2 x daily - 7 x weekly - 10 reps Clamshell - 1-2 x daily - 7 x weekly - 15 reps Child's Pose with Sidebending - 1-2 x daily - 7 x weekly - 2 reps - 15 seconds hold

## 2019-12-21 NOTE — Therapy (Signed)
[place], [place], [address] Phone: [phone]   Fax:  [phone]  Physical Therapy Treatment  Patient Details  Name: [name] [name] MRN: [id number] Date of Birth: 04/28/1941 Referring Provider (PT): Gregor Hams, MD   Encounter Date: 12/21/2019  PT End of Session - 12/21/19 1137    Visit Number  5    Number of Visits  16    Date for PT Re-Evaluation  01/25/20    Authorization Type  [employer]    PT Start Time  1131    PT Stop Time  1210    PT Time Calculation (min)  39 min    Activity Tolerance  Patient tolerated treatment well    Behavior During Therapy  [facility] for tasks assessed/performed       Past Medical History:  Diagnosis Date  . Benign neoplasm of colon   . Cataract    bilateral,had surgery  . DDD (degenerative disc disease), lumbar    MRI w. [place] [facility] 05/2015, s/p ESI x 1 w/ improvement  . Diaphragmatic hernia without mention of obstruction or gangrene   . Diverticulosis of colon (without mention of hemorrhage)   . DJD (degenerative joint disease) of knee    L>R, IA cortisone by [place] [facility] in L 03/2015  . Esophageal reflux   . Gallstones   . History of chicken pox   . Hyperlipidemia   . Hypertension   . Irritable bowel syndrome   . Migraines   . Obesity, unspecified   . Osteoporosis, unspecified   . Tachycardia   . Unspecified hemorrhoids with other complication     Past Surgical History:  Procedure Laterality Date  . APPENDECTOMY  1979  . CHOLECYSTECTOMY  1989  . COLONOSCOPY    . POLYPECTOMY    . VAGINAL HYSTERECTOMY  1996    There were no vitals filed for this visit.  Subjective Assessment - 12/21/19 1135    Subjective  Patient reports she is doing well. No new changes. States that the right lower back starts to act up in the afternoons.    Patient Stated Goals  Get rid of pain so she can perform household activities    Currently in Pain?  Yes    Pain Score  2      Pain Location  Back    Pain Orientation  Right;Lower    Pain Descriptors / Indicators  Aching    Pain Type  Chronic pain    Pain Onset  More than a month ago    Pain Frequency  Intermittent    Aggravating Factors   Standing         [facility] PT Assessment - 12/21/19 0001      Observation/Other Assessments   Focus on Therapeutic Outcomes (FOTO)   35% limitation      AROM   Lumbar Flexion  25% - patient able to bend forward but lumbar spine remains in a relatively neutral/lordotic position    Lumbar Extension  [facility]    Lumbar - Right Side Bend  [facility]    Lumbar - Left Side Bend  75%    Lumbar - Right Rotation  [facility]    Lumbar - Left Rotation  [facility]                    [facility] Adult PT Treatment/Exercise - 12/21/19 0001      Exercises   Exercises  Lumbar      Lumbar  Exercises: Stretches   Lower Trunk Rotation  5 reps;10 seconds    Lower Trunk Rotation Limitations  figure 4 position    Piriformis Stretch  2 reps;20 seconds    Piriformis Stretch Limitations  supine      Lumbar Exercises: Aerobic   Nustep  L4 x6 min (LE only)      Lumbar Exercises: Standing   Lifting Limitations  Dead lift with 15# from 6" step 2x10    Other Standing Lumbar Exercises  Row and extension with green band x20 each    Other Standing Lumbar Exercises  Unilateral farmers carry with 10 lbs      Lumbar Exercises: Seated   Sit to Stand  10 reps   2 sets   Sit to Stand Limitations  holding 5# kettlebell at chest      Lumbar Exercises: Supine   Bridge with March  10 reps   2 sets            PT Education - 12/21/19 1136    Education Details  HEP    Person(s) Educated  Patient    Methods  Explanation;Demonstration;Verbal cues    Comprehension  Verbalized understanding;Returned demonstration;Verbal cues required;Need further instruction       PT Short Term Goals - 11/30/19 1125      PT SHORT TERM GOAL #1   Title  Patient will be I with initial HEP to progress with PT    Time  4     Period  Weeks    Status  New    Target Date  12/28/19      PT SHORT TERM GOAL #2   Title  Patient will be able to perform usual housework with moderate or less difficulty    Time  4    Period  Weeks    Status  New    Target Date  12/28/19      PT SHORT TERM GOAL #3   Title  Patient exhibit proper log roll technique for bed mobility and report a little bit or less difficulty getting out of bed    Time  4    Period  Weeks    Status  New    Target Date  12/28/19      PT SHORT TERM GOAL #4   Title  Patient will report improved pain with activity to </= 4/10 to improve functional ability    Time  4    Period  Weeks    Status  New    Target Date  12/28/19        PT Long Term Goals - 11/30/19 1125      PT LONG TERM GOAL #1   Title  Patient will be I with final HEP to maintain progress from PT    Time  8    Period  Weeks    Status  New    Target Date  01/25/20      PT LONG TERM GOAL #2   Title  Patient will report improved functional level to </= 50% limitation on FOTO    Time  8    Period  Weeks    Status  New    Target Date  01/25/20      PT LONG TERM GOAL #3   Title  Patient will report no limitation with standing or walking >/= 1 hour to improve grocery shopping ability    Time  8    Period  Weeks    Status  New    Target Date  01/25/20      PT LONG TERM GOAL #4   Title  Patient will report no difficulty with any bathing or dressing tasks to improve self care ability    Time  8    Period  Weeks    Status  New    Target Date  01/25/20            Plan - 12/21/19 1137    Clinical Impression Statement  Patient tolerated therapy well with no adverse effects. She exhibits improved lumbar motion this visit and is continuing to tolerate strengthening progressions well. She notes improvement in functional level. She would benefit from continued skilled PT to progress strength and tolerance with activity so she can reduce pain and limitation.    PT  Treatment/Interventions  ADLs/Self Care Home Management;Cryotherapy;Electrical Stimulation;Moist Heat;Ultrasound;Neuromuscular re-education;Balance training;Therapeutic exercise;Therapeutic activities;Functional mobility training;Stair training;Gait training;Patient/family education;Manual techniques;Dry needling;Passive range of motion;Taping;Spinal Manipulations;Joint Manipulations    PT Next Visit Plan  progress CKC strength with core engagement, weight with lifting/bending    PT Home Exercise Plan  [id number]: seated hamstring stretch, supine single knee to chest stretch, supine piriformis stretch, LTR, supine pelvic tilt, bridge, side clamshell, child's pose stretch to side (pillow under hips), standing core engagement, heel raises, gastroc stretch, squats at sink, banded ER and horiz abd    Consulted and Agree with Plan of Care  Patient       Patient will benefit from skilled therapeutic intervention in order to improve the following deficits and impairments:  Decreased range of motion, Decreased activity tolerance, Pain, Decreased strength, Postural dysfunction, Impaired flexibility, Improper body mechanics, Difficulty walking  Visit Diagnosis: Muscle weakness (generalized)  Abnormal posture  Chronic right-sided low back pain, unspecified whether sciatica present     Problem List Patient Active Problem List   Diagnosis Date Noted  . Lower back pain 11/07/2019  . Closed compression fracture of lumbar vertebra ([place]), T11, T12, L1 12/30/2017  . Lung nodules, stable, no f/u needed 12/30/2017  . Hypothyroidism 11/01/2016  . Constipation 04/30/2016  . Prediabetes 11/01/2015  . Spinal stenosis of lumbar region 10/03/2015  . DDD (degenerative disc disease), lumbar   . DJD (degenerative joint disease) of knee   . Hypertension 11/25/2010  . History of colon polyps 05/14/2009  . G E R D 08/17/2007  . Hyperlipidemia 08/16/2007  . HIATAL HERNIA 08/16/2007  . DIVERTICULAR DISEASE  08/16/2007  . IBS 08/16/2007  . Osteoporosis 08/16/2007    Hilda Blades, PT, DPT, LAT, ATC 12/21/19  12:10 PM Phone: [phone] Fax: [facility] [address] [place], [place], [address] Phone: [phone]   Fax:  [phone]  Name: OMOLARA KAMDAR MRN: [id number] Date of Birth: [birth year]

## 2019-12-27 ENCOUNTER — Ambulatory Visit: Payer: PPO | Attending: Family Medicine | Admitting: Physical Therapy

## 2019-12-27 ENCOUNTER — Other Ambulatory Visit: Payer: Self-pay

## 2019-12-27 ENCOUNTER — Encounter: Payer: Self-pay | Admitting: Physical Therapy

## 2019-12-27 DIAGNOSIS — M545 Low back pain: Secondary | ICD-10-CM | POA: Insufficient documentation

## 2019-12-27 DIAGNOSIS — G8929 Other chronic pain: Secondary | ICD-10-CM | POA: Diagnosis not present

## 2019-12-27 DIAGNOSIS — R293 Abnormal posture: Secondary | ICD-10-CM | POA: Diagnosis not present

## 2019-12-27 DIAGNOSIS — M6281 Muscle weakness (generalized): Secondary | ICD-10-CM | POA: Diagnosis not present

## 2019-12-27 NOTE — Therapy (Signed)
[place], [place], [address] Phone: [phone]   Fax:  [phone]  Physical Therapy Treatment  Patient Details  Name: [name] [name] MRN: [id number] Date of Birth: 04-Dec-1940 Referring Provider (PT): Gregor Hams, MD   Encounter Date: 12/27/2019  PT End of Session - 12/27/19 1126    Visit Number  6    Number of Visits  16    Date for PT Re-Evaluation  01/25/20    Authorization Type  [employer]    PT Start Time  1122    PT Stop Time  1203    PT Time Calculation (min)  41 min    Activity Tolerance  Patient tolerated treatment well    Behavior During Therapy  [facility] for tasks assessed/performed       Past Medical History:  Diagnosis Date   Benign neoplasm of colon    Cataract    bilateral,had surgery   DDD (degenerative disc disease), lumbar    MRI w. [place] [facility] 05/2015, s/p ESI x 1 w/ improvement   Diaphragmatic hernia without mention of obstruction or gangrene    Diverticulosis of colon (without mention of hemorrhage)    DJD (degenerative joint disease) of knee    L>R, IA cortisone by [place] [facility] in L 03/2015   Esophageal reflux    Gallstones    History of chicken pox    Hyperlipidemia    Hypertension    Irritable bowel syndrome    Migraines    Obesity, unspecified    Osteoporosis, unspecified    Tachycardia    Unspecified hemorrhoids with other complication     Past Surgical History:  Procedure Laterality Date   [place]    There were no vitals filed for this visit.  Subjective Assessment - 12/27/19 1125    Subjective  Patient reports she is doing well. She feels good in the mornings but does notice that standing gets tiring as the day goes on.    Patient Stated Goals  Get rid of pain so she can perform household activities    Currently in Pain?  No/denies                         [facility] Adult PT Treatment/Exercise - 12/27/19 0001      Exercises   Exercises  Lumbar      Lumbar Exercises: Stretches   Lower Trunk Rotation  5 reps;10 seconds    Lower Trunk Rotation Limitations  figure 4 position    Piriformis Stretch  2 reps;20 seconds    Piriformis Stretch Limitations  supine      Lumbar Exercises: Aerobic   Nustep  L5 x5 min (LE only)      Lumbar Exercises: Standing   Lifting Limitations  Dead lift with 25# from 6" step 3x8    Other Standing Lumbar Exercises  Extension with green band x20    Other Standing Lumbar Exercises  Unilateral farmers carry with 10 lbs x1 lap around building in each hand      Lumbar Exercises: Seated   Sit to Stand  10 reps   2 sets   Sit to Stand Limitations  holding 8# dumbbell at chest      Lumbar Exercises: Supine   Bridge with March  10 reps   2 sets  Straight Leg Raise  10 reps   2 sets            PT Education - 12/27/19 1126    Education Details  HEP    Person(s) Educated  Patient    Methods  Explanation;Demonstration;Verbal cues    Comprehension  Verbalized understanding;Returned demonstration;Verbal cues required;Need further instruction       PT Short Term Goals - 12/27/19 1128      PT SHORT TERM GOAL #1   Title  Patient will be I with initial HEP to progress with PT    Time  4    Period  Weeks    Status  On-going    Target Date  12/28/19      PT SHORT TERM GOAL #2   Title  Patient will be able to perform usual housework with moderate or less difficulty    Time  4    Period  Weeks    Status  Achieved    Target Date  12/28/19      PT SHORT TERM GOAL #3   Title  Patient exhibit proper log roll technique for bed mobility and report a little bit or less difficulty getting out of bed    Time  4    Period  Weeks    Status  Achieved    Target Date  12/28/19      PT SHORT TERM GOAL #4   Title  Patient will report improved pain with activity to </= 4/10 to  improve functional ability    Time  4    Period  Weeks    Status  Achieved    Target Date  12/28/19        PT Long Term Goals - 11/30/19 1125      PT LONG TERM GOAL #1   Title  Patient will be I with final HEP to maintain progress from PT    Time  8    Period  Weeks    Status  New    Target Date  01/25/20      PT LONG TERM GOAL #2   Title  Patient will report improved functional level to </= 50% limitation on FOTO    Time  8    Period  Weeks    Status  New    Target Date  01/25/20      PT LONG TERM GOAL #3   Title  Patient will report no limitation with standing or walking >/= 1 hour to improve grocery shopping ability    Time  8    Period  Weeks    Status  New    Target Date  01/25/20      PT LONG TERM GOAL #4   Title  Patient will report no difficulty with any bathing or dressing tasks to improve self care ability    Time  8    Period  Weeks    Status  New    Target Date  01/25/20            Plan - 12/27/19 1127    Clinical Impression Statement  Patient tolerated therapy well with no adverse effects. She continues to progress well with her strenthening exercises and did not report any increase in pain this visit. Most of her complaints are toward the end of the day so will focus on continued strengthening and endurance. She would benefit from continued skilled PT to progress strength and tolerance with activity so she can  reduce pain and limitation.    PT Treatment/Interventions  ADLs/Self Care Home Management;Cryotherapy;Electrical Stimulation;Moist Heat;Ultrasound;Neuromuscular re-education;Balance training;Therapeutic exercise;Therapeutic activities;Functional mobility training;Stair training;Gait training;Patient/family education;Manual techniques;Dry needling;Passive range of motion;Taping;Spinal Manipulations;Joint Manipulations    PT Next Visit Plan  progress CKC strength with core engagement, weight with lifting/bending    PT Home Exercise Plan  [id number]:  seated hamstring stretch, supine single knee to chest stretch, supine piriformis stretch, LTR, supine pelvic tilt, bridge, side clamshell, child's pose stretch to side (pillow under hips), standing core engagement, heel raises, gastroc stretch, squats at sink, banded ER and horiz abd    Consulted and Agree with Plan of Care  Patient       Patient will benefit from skilled therapeutic intervention in order to improve the following deficits and impairments:  Decreased range of motion, Decreased activity tolerance, Pain, Decreased strength, Postural dysfunction, Impaired flexibility, Improper body mechanics, Difficulty walking  Visit Diagnosis: Muscle weakness (generalized)  Abnormal posture  Chronic right-sided low back pain, unspecified whether sciatica present     Problem List Patient Active Problem List   Diagnosis Date Noted   Lower back pain 11/07/2019   Closed compression fracture of lumbar vertebra ([place]), T11, T12, L1 12/30/2017   Lung nodules, stable, no f/u needed 12/30/2017   Hypothyroidism 11/01/2016   Constipation 04/30/2016   Prediabetes 11/01/2015   Spinal stenosis of lumbar region 10/03/2015   DDD (degenerative disc disease), lumbar    DJD (degenerative joint disease) of knee    Hypertension 11/25/2010   History of colon polyps 05/14/2009   G E R D 08/17/2007   Hyperlipidemia 08/16/2007   HIATAL HERNIA 08/16/2007   DIVERTICULAR DISEASE 08/16/2007   IBS 08/16/2007   Osteoporosis 08/16/2007    Hilda Blades, PT, DPT, LAT, ATC 12/27/19  12:03 PM Phone: [phone] Fax: [place], [place], [address] Phone: [phone]   Fax:  [phone]  Name: [name] [name] MRN: [id number] Date of Birth: 08/17/1940

## 2019-12-29 ENCOUNTER — Other Ambulatory Visit: Payer: Self-pay

## 2019-12-29 ENCOUNTER — Ambulatory Visit: Payer: PPO | Admitting: Physical Therapy

## 2019-12-29 ENCOUNTER — Encounter: Payer: Self-pay | Admitting: Physical Therapy

## 2019-12-29 DIAGNOSIS — R293 Abnormal posture: Secondary | ICD-10-CM

## 2019-12-29 DIAGNOSIS — M6281 Muscle weakness (generalized): Secondary | ICD-10-CM

## 2019-12-29 DIAGNOSIS — G8929 Other chronic pain: Secondary | ICD-10-CM

## 2019-12-29 NOTE — Therapy (Signed)
[place], [place], [address] Phone: [phone]   Fax:  [phone]  Physical Therapy Treatment  Patient Details  Name: [name] [name] MRN: [id number] Date of Birth: May 16, 1941 Referring Provider (PT): Gregor Hams, MD   Encounter Date: 12/29/2019  PT End of Session - 12/29/19 1135    Visit Number  7    Number of Visits  16    Date for PT Re-Evaluation  01/25/20    Authorization Type  [employer]    PT Start Time  1131    PT Stop Time  1210    PT Time Calculation (min)  39 min    Activity Tolerance  Patient tolerated treatment well    Behavior During Therapy  [facility] for tasks assessed/performed       Past Medical History:  Diagnosis Date  . Benign neoplasm of colon   . Cataract    bilateral,had surgery  . DDD (degenerative disc disease), lumbar    MRI w. [place] [facility] 05/2015, s/p ESI x 1 w/ improvement  . Diaphragmatic hernia without mention of obstruction or gangrene   . Diverticulosis of colon (without mention of hemorrhage)   . DJD (degenerative joint disease) of knee    L>R, IA cortisone by [facility] [facility] in L 03/2015  . Esophageal reflux   . Gallstones   . History of chicken pox   . Hyperlipidemia   . Hypertension   . Irritable bowel syndrome   . Migraines   . Obesity, unspecified   . Osteoporosis, unspecified   . Tachycardia   . Unspecified hemorrhoids with other complication     Past Surgical History:  Procedure Laterality Date  . APPENDECTOMY  1979  . CHOLECYSTECTOMY  1989  . COLONOSCOPY    . POLYPECTOMY    . VAGINAL HYSTERECTOMY  1996    There were no vitals filed for this visit.  Subjective Assessment - 12/29/19 1134    Subjective  Patient reports no new changes since last visit. She is doing well.    Patient Stated Goals  Get rid of pain so she can perform household activities    Currently in Pain?  No/denies         [facility] PT Assessment - 12/29/19 0001      Strength    Right Hip Extension  4/5    Right Hip ABduction  4-/5    Left Hip Extension  4/5    Left Hip ABduction  4-/5                    [facility] Adult PT Treatment/Exercise - 12/29/19 0001      Exercises   Exercises  Lumbar      Lumbar Exercises: Stretches   Lower Trunk Rotation  5 reps;10 seconds    Lower Trunk Rotation Limitations  figure 4 position    Piriformis Stretch  2 reps;20 seconds    Piriformis Stretch Limitations  supine      Lumbar Exercises: Aerobic   Nustep  L5 x5 min (LE only)      Lumbar Exercises: Standing   Lifting Limitations  Dead lift with 25# from 6" step 3x8    Other Standing Lumbar Exercises  Shoulder row and extension with blue band 2x15    Other Standing Lumbar Exercises  Hip abduction, extension, marching with 2# 2x10 each      Lumbar Exercises: Supine   Bridge with March  15 reps   2  sets   Other Supine Lumbar Exercises  90-90 alternating leg extension 2x10      Lumbar Exercises: Sidelying   Hip Abduction  10 reps             PT Education - 12/29/19 1134    Education Details  HEP    Person(s) Educated  Patient    Methods  Explanation;Demonstration;Verbal cues    Comprehension  Verbalized understanding;Returned demonstration;Verbal cues required;Need further instruction       PT Short Term Goals - 12/27/19 1128      PT SHORT TERM GOAL #1   Title  Patient will be I with initial HEP to progress with PT    Time  4    Period  Weeks    Status  On-going    Target Date  12/28/19      PT SHORT TERM GOAL #2   Title  Patient will be able to perform usual housework with moderate or less difficulty    Time  4    Period  Weeks    Status  Achieved    Target Date  12/28/19      PT SHORT TERM GOAL #3   Title  Patient exhibit proper log roll technique for bed mobility and report a little bit or less difficulty getting out of bed    Time  4    Period  Weeks    Status  Achieved    Target Date  12/28/19      PT SHORT TERM GOAL #4    Title  Patient will report improved pain with activity to </= 4/10 to improve functional ability    Time  4    Period  Weeks    Status  Achieved    Target Date  12/28/19        PT Long Term Goals - 11/30/19 1125      PT LONG TERM GOAL #1   Title  Patient will be I with final HEP to maintain progress from PT    Time  8    Period  Weeks    Status  New    Target Date  01/25/20      PT LONG TERM GOAL #2   Title  Patient will report improved functional level to </= 50% limitation on FOTO    Time  8    Period  Weeks    Status  New    Target Date  01/25/20      PT LONG TERM GOAL #3   Title  Patient will report no limitation with standing or walking >/= 1 hour to improve grocery shopping ability    Time  8    Period  Weeks    Status  New    Target Date  01/25/20      PT LONG TERM GOAL #4   Title  Patient will report no difficulty with any bathing or dressing tasks to improve self care ability    Time  8    Period  Weeks    Status  New    Target Date  01/25/20            Plan - 12/29/19 1135    Clinical Impression Statement  Patient tolerated therapy well with no adverse effects. She continues to progress well with strengthening and lifting exercises. Her hip and core strength is improving and she is reporting ability to perform heavier activities around home without difficulty. No increase in pain reported with  therapy. She would benefit from continued skilled PT to progress strength and tolerance with activity so she can reduce pain and limitation.    PT Treatment/Interventions  ADLs/Self Care Home Management;Cryotherapy;Electrical Stimulation;Moist Heat;Ultrasound;Neuromuscular re-education;Balance training;Therapeutic exercise;Therapeutic activities;Functional mobility training;Stair training;Gait training;Patient/family education;Manual techniques;Dry needling;Passive range of motion;Taping;Spinal Manipulations;Joint Manipulations    PT Next Visit Plan  progress CKC  strength with core engagement, weight with lifting/bending    PT Home Exercise Plan  [id number]: seated hamstring stretch, supine single knee to chest stretch, supine piriformis stretch, LTR, marching bridge, side clamshell, child's pose stretch to side (pillow under hips), standing core engagement, heel raises, gastroc stretch, squats at sink, banded ER and horiz abd    Consulted and Agree with Plan of Care  Patient       Patient will benefit from skilled therapeutic intervention in order to improve the following deficits and impairments:  Decreased range of motion, Decreased activity tolerance, Pain, Decreased strength, Postural dysfunction, Impaired flexibility, Improper body mechanics, Difficulty walking  Visit Diagnosis: Muscle weakness (generalized)  Abnormal posture  Chronic right-sided low back pain, unspecified whether sciatica present     Problem List Patient Active Problem List   Diagnosis Date Noted  . Lower back pain 11/07/2019  . Closed compression fracture of lumbar vertebra ([place]), T11, T12, L1 12/30/2017  . Lung nodules, stable, no f/u needed 12/30/2017  . Hypothyroidism 11/01/2016  . Constipation 04/30/2016  . Prediabetes 11/01/2015  . Spinal stenosis of lumbar region 10/03/2015  . DDD (degenerative disc disease), lumbar   . DJD (degenerative joint disease) of knee   . Hypertension 11/25/2010  . History of colon polyps 05/14/2009  . G E R D 08/17/2007  . Hyperlipidemia 08/16/2007  . HIATAL HERNIA 08/16/2007  . DIVERTICULAR DISEASE 08/16/2007  . IBS 08/16/2007  . Osteoporosis 08/16/2007    Hilda Blades, PT, DPT, LAT, ATC 12/29/19  12:11 PM Phone: [phone] Fax: [facility] [address] [place], [place], [address] Phone: [phone]   Fax:  [phone]  Name: [name] [name] MRN: [id number] Date of Birth: [birth year]

## 2020-01-05 ENCOUNTER — Encounter: Payer: Self-pay | Admitting: Physical Therapy

## 2020-01-05 ENCOUNTER — Other Ambulatory Visit: Payer: Self-pay

## 2020-01-05 ENCOUNTER — Ambulatory Visit: Payer: PPO | Admitting: Physical Therapy

## 2020-01-05 DIAGNOSIS — M6281 Muscle weakness (generalized): Secondary | ICD-10-CM | POA: Diagnosis not present

## 2020-01-05 DIAGNOSIS — G8929 Other chronic pain: Secondary | ICD-10-CM

## 2020-01-05 DIAGNOSIS — R293 Abnormal posture: Secondary | ICD-10-CM

## 2020-01-05 NOTE — Therapy (Signed)
[place], [place], [address] Phone: [phone]   Fax:  [phone]  Physical Therapy Treatment/Discharge  Patient Details  Name: [name] [name] MRN: [id number] Date of Birth: 07-20-1941 Referring Provider (PT): Gregor Hams, MD   Encounter Date: 01/05/2020   PT End of Session - 01/05/20 1524    Visit Number 8    Number of Visits 16    Date for PT Re-Evaluation 01/25/20    Authorization Type [employer]    PT Start Time 1500    PT Stop Time 1524    PT Time Calculation (min) 24 min    Activity Tolerance Patient tolerated treatment well    Behavior During Therapy [facility] for tasks assessed/performed           Past Medical History:  Diagnosis Date  . Benign neoplasm of colon   . Cataract    bilateral,had surgery  . DDD (degenerative disc disease), lumbar    MRI w. [place] [facility] 05/2015, s/p ESI x 1 w/ improvement  . Diaphragmatic hernia without mention of obstruction or gangrene   . Diverticulosis of colon (without mention of hemorrhage)   . DJD (degenerative joint disease) of knee    L>R, IA cortisone by [place] [facility] in L 03/2015  . Esophageal reflux   . Gallstones   . History of chicken pox   . Hyperlipidemia   . Hypertension   . Irritable bowel syndrome   . Migraines   . Obesity, unspecified   . Osteoporosis, unspecified   . Tachycardia   . Unspecified hemorrhoids with other complication     Past Surgical History:  Procedure Laterality Date  . APPENDECTOMY  1979  . CHOLECYSTECTOMY  1989  . COLONOSCOPY    . POLYPECTOMY    . VAGINAL HYSTERECTOMY  1996    There were no vitals filed for this visit.   Subjective Assessment - 01/05/20 1501    Subjective Much better than before, I think imgonna make it now. I was up until about lunch time and I was ok.    Patient Stated Goals Get rid of pain so she can perform household activities    Currently in Pain? No/denies              [facility]  PT Assessment - 01/05/20 0001      Assessment   Medical Diagnosis Lumbosacral strain    Referring Provider (PT) Gregor Hams, MD    Onset Date/Surgical Date 11/03/19      Observation/Other Assessments   Focus on Therapeutic Outcomes (FOTO)  28% limited      Strength   Right Hip Extension 4/5    Right Hip ABduction 4-/5    Left Hip Extension 4/5    Left Hip ABduction 4-/5                                 PT Education - 01/05/20 1525    Education Details importance of continued HEP, balancing good v bad with movements, return to PT prn    Person(s) Educated Patient    Methods Explanation;Handout    Comprehension Verbalized understanding            PT Short Term Goals - 12/27/19 1128      PT SHORT TERM GOAL #1   Title Patient will be I with initial HEP to progress with PT    Time 4  Period Weeks    Status On-going    Target Date 12/28/19      PT SHORT TERM GOAL #2   Title Patient will be able to perform usual housework with moderate or less difficulty    Time 4    Period Weeks    Status Achieved    Target Date 12/28/19      PT SHORT TERM GOAL #3   Title Patient exhibit proper log roll technique for bed mobility and report a little bit or less difficulty getting out of bed    Time 4    Period Weeks    Status Achieved    Target Date 12/28/19      PT SHORT TERM GOAL #4   Title Patient will report improved pain with activity to </= 4/10 to improve functional ability    Time 4    Period Weeks    Status Achieved    Target Date 12/28/19             PT Long Term Goals - 01/05/20 1508      PT LONG TERM GOAL #1   Title Patient will be I with final HEP to maintain progress from PT    Status Achieved      PT LONG TERM GOAL #2   Title Patient will report improved functional level to </= 50% limitation on FOTO    Status Achieved      PT LONG TERM GOAL #3   Title Patient will report no limitation with standing or walking >/= 1 hour to  improve grocery shopping ability    Status Achieved      PT LONG TERM GOAL #4   Title Patient will report no difficulty with any bathing or dressing tasks to improve self care ability    Status Achieved                 Plan - 01/05/20 1526    Clinical Impression Statement Mrs Sigler has met all of her goals at this time and is prepared for d/c to independent program. Final HEP was printed and reviewed with her and she feels comfortable with them. Encouraged her to contact us with any further questions.    PT Treatment/Interventions ADLs/Self Care Home Management;Cryotherapy;Electrical Stimulation;Moist Heat;Ultrasound;Neuromuscular re-education;Balance training;Therapeutic exercise;Therapeutic activities;Functional mobility training;Stair training;Gait training;Patient/family education;Manual techniques;Dry needling;Passive range of motion;Taping;Spinal Manipulations;Joint Manipulations    PT Home Exercise Plan [id number]: seated hamstring stretch, supine single knee to chest stretch, supine piriformis stretch, LTR, marching bridge, side clamshell, child's pose stretch to side (pillow under hips), standing core engagement, heel raises, gastroc stretch, squats at sink, banded ER and horiz abd    Consulted and Agree with Plan of Care Patient           Patient will benefit from skilled therapeutic intervention in order to improve the following deficits and impairments:  Decreased range of motion, Decreased activity tolerance, Pain, Decreased strength, Postural dysfunction, Impaired flexibility, Improper body mechanics, Difficulty walking  Visit Diagnosis: Muscle weakness (generalized)  Abnormal posture  Chronic right-sided low back pain, unspecified whether sciatica present     Problem List Patient Active Problem List   Diagnosis Date Noted  . Lower back pain 11/07/2019  . Closed compression fracture of lumbar vertebra ([place]), T11, T12, L1 12/30/2017  . Lung nodules, stable, no  f/u needed 12/30/2017  . Hypothyroidism 11/01/2016  . Constipation 04/30/2016  . Prediabetes 11/01/2015  . Spinal stenosis of lumbar region 10/03/2015  . DDD (degenerative disc  disease), lumbar   . DJD (degenerative joint disease) of knee   . Hypertension 11/25/2010  . History of colon polyps 05/14/2009  . G E R D 08/17/2007  . Hyperlipidemia 08/16/2007  . HIATAL HERNIA 08/16/2007  . DIVERTICULAR DISEASE 08/16/2007  . IBS 08/16/2007  . Osteoporosis 08/16/2007    Kymia Simi C. Allsion Nogales PT, DPT 01/05/20 3:29 PM  PHYSICAL THERAPY DISCHARGE SUMMARY  Visits from Start of Care: 8  Current functional level related to goals / functional outcomes: See above   Remaining deficits: See above   Education / Equipment: Anatomy of condition, POC, HEP, exercise form/rationale  Plan: Patient agrees to discharge.  Patient goals were met. Patient is being discharged due to meeting the stated rehab goals.  ?????      [place], [place], [address] Phone: [phone]   Fax:  [phone]  Name: [name] [name] MRN: [id number] Date of Birth: March 15, 1941

## 2020-01-12 ENCOUNTER — Ambulatory Visit: Payer: PPO | Admitting: Physical Therapy

## 2020-01-17 ENCOUNTER — Other Ambulatory Visit: Payer: Self-pay | Admitting: Internal Medicine

## 2020-01-17 DIAGNOSIS — Z1231 Encounter for screening mammogram for malignant neoplasm of breast: Secondary | ICD-10-CM

## 2020-01-19 ENCOUNTER — Encounter: Payer: PPO | Admitting: Physical Therapy

## 2020-01-24 ENCOUNTER — Other Ambulatory Visit: Payer: Self-pay

## 2020-01-24 ENCOUNTER — Ambulatory Visit
Admission: RE | Admit: 2020-01-24 | Discharge: 2020-01-24 | Disposition: A | Discharge status: Home / Self Care | Payer: PPO | Source: Ambulatory Visit | Attending: Internal Medicine | Admitting: Internal Medicine

## 2020-01-24 DIAGNOSIS — Z1231 Encounter for screening mammogram for malignant neoplasm of breast: Secondary | ICD-10-CM

## 2020-01-25 ENCOUNTER — Ambulatory Visit: Payer: PPO | Admitting: Pharmacist

## 2020-01-25 ENCOUNTER — Other Ambulatory Visit: Payer: Self-pay

## 2020-01-25 DIAGNOSIS — E7849 Other hyperlipidemia: Secondary | ICD-10-CM

## 2020-01-25 DIAGNOSIS — K219 Gastro-esophageal reflux disease without esophagitis: Secondary | ICD-10-CM

## 2020-01-25 DIAGNOSIS — I10 Essential (primary) hypertension: Secondary | ICD-10-CM

## 2020-01-25 NOTE — Patient Instructions (Addendum)
Visit Information  Phone number for Pharmacist: [phone]  Goals Addressed            This Visit's Progress   . Pharmacy Care Plan       CARE PLAN ENTRY (see longitudinal plan of care for additional care plan information)  Current Barriers:  . Chronic Disease Management support, education, and care coordination needs related to Hypertension, Hyperlipidemia, and GERD   Hypertension BP Readings from Last 3 Encounters:  11/18/19 140/78  11/07/19 (!) 156/84  08/02/19 (!) 166/84 .  Pharmacist Clinical Goal(s): o Over the next 150 days, patient will work with PharmD and providers to maintain BP goal <140/90 . Current regimen:  o bisoprolol/HCTZ 5/6.25 mg daily . Interventions: o Discussed white coat hypertension and BP goals . Patient self care activities - Over the next 150 days, patient will: o Check BP daily, document, and provide at future appointments o Ensure daily salt intake < 2300 mg/day  Hyperlipidemia Lab Results  Component Value Date/Time   LDLCALC 119 (H) 01/11/2019 10:19 AM   LDLDIRECT 158.0 06/27/2015 11:24 AM .  Pharmacist Clinical Goal(s): o Over the next 150 days, patient will work with PharmD and providers to achieve LDL goal < 100 . Current regimen:  o Pravastatin 10 mg daily . Interventions: o Discussed cholesterol goals and benefits of medication for prevention of heart attack / stroke . Patient self care activities - Over the next 150 days, patient will: o Continue current medications o Continue low cholesterol diet  Heartburn / GERD . Pharmacist Clinical Goal(s) o Over the next 150 days, patient will work with PharmD and providers to optimize therapy . Current regimen:  o famotidine 20 mg daily,  o omeprazole 20 mg daily,  . Interventions: o Discussed optimal timing for famotidine (Pepcid) . Patient self care activities - Over the next 150 days, patient will: o Take Famotidine 15-30 min before a meal for optimal benefit  Medication  management . Pharmacist Clinical Goal(s): o Over the next 150 days, patient will work with PharmD and providers to maintain optimal medication adherence . [facility] . Interventions o Comprehensive medication review performed. o Continue current medication management strategy . Patient self care activities - Over the next 150 days, patient will: o Focus on medication adherence by fill date o Take medications as prescribed o Report any questions or concerns to PharmD and/or provider(s)  Please see past updates related to this goal by clicking on the "Past Updates" button in the selected goal       The patient verbalized understanding of instructions provided today and agreed to receive a mailed copy of patient instruction and/or educational materials.  Telephone follow up appointment with pharmacy team member scheduled for: 5 months  Charlene Brooke, PharmD Clinical Pharmacist [facility] [phone]  How to Take Your Blood Pressure Blood pressure is a measurement of how strongly your blood is pressing against the walls of your arteries. Arteries are blood vessels that carry blood from your heart throughout your body. Your health care provider takes your blood pressure at each office visit. You can also take your own blood pressure at home with a blood pressure machine. You may need to take your own blood pressure:  To confirm a diagnosis of high blood pressure (hypertension).  To monitor your blood pressure over time.  To make sure your blood pressure medicine is working. Supplies needed: To take your blood pressure, you will need a blood pressure machine. You  can buy a blood pressure machine, or blood pressure monitor, at most drugstores or online. There are several types of home blood pressure monitors. When choosing one, consider the following:  Choose a monitor that has an arm cuff.  Choose a cuff that wraps snugly around your upper  arm. You should be able to fit only one finger between your arm and the cuff.  Do not choose a monitor that measures your blood pressure from your wrist or finger. Your health care provider can suggest a reliable monitor that will meet your needs. How to prepare To get the most accurate reading, avoid the following for 30 minutes before you check your blood pressure:  Drinking caffeine.  Drinking alcohol.  Eating.  Smoking.  Exercising. Five minutes before you check your blood pressure:  Empty your bladder.  Sit quietly without talking in a dining chair, rather than in a soft couch or armchair. How to take your blood pressure To check your blood pressure, follow the instructions in the manual that came with your blood pressure monitor. If you have a digital blood pressure monitor, the instructions may be as follows: 1. Sit up straight. 2. Place your feet on the floor. Do not cross your ankles or legs. 3. Rest your left arm at the level of your heart on a table or desk or on the arm of a chair. 4. Pull up your shirt sleeve. 5. Wrap the blood pressure cuff around the upper part of your left arm, 1 inch (2.5 cm) above your elbow. It is best to wrap the cuff around bare skin. 6. Fit the cuff snugly around your arm. You should be able to place only one finger between the cuff and your arm. 7. Position the cord inside the groove of your elbow. 8. Press the power button. 9. Sit quietly while the cuff inflates and deflates. 10. Read the digital reading on the monitor screen and write it down (record it). 11. Wait 2-3 minutes, then repeat the steps, starting at step 1. What does my blood pressure reading mean? A blood pressure reading consists of a higher number over a lower number. Ideally, your blood pressure should be below 120/80. The first ("top") number is called the systolic pressure. It is a measure of the pressure in your arteries as your heart beats. The second ("bottom") number  is called the diastolic pressure. It is a measure of the pressure in your arteries as the heart relaxes. Blood pressure is classified into four stages. The following are the stages for adults who do not have a short-term serious illness or a chronic condition. Systolic pressure and diastolic pressure are measured in a unit called mm Hg. Normal  Systolic pressure: below 761.  Diastolic pressure: below 80. Elevated  Systolic pressure: 607-371.  Diastolic pressure: below 80. Hypertension stage 1  Systolic pressure: 062-694.  Diastolic pressure: 85-46. Hypertension stage 2  Systolic pressure: 270 or above.  Diastolic pressure: 90 or above. You can have prehypertension or hypertension even if only the systolic or only the diastolic number in your reading is higher than normal. Follow these instructions at home:  Check your blood pressure as often as recommended by your health care provider.  Take your monitor to the next appointment with your health care provider to make sure: ? That you are using it correctly. ? That it provides accurate readings.  Be sure you understand what your goal blood pressure numbers are.  Tell your health care provider if you  are having any side effects from blood pressure medicine. Contact a health care provider if:  Your blood pressure is consistently high. Get help right away if:  Your systolic blood pressure is higher than 180.  Your diastolic blood pressure is higher than 110. This information is not intended to replace advice given to you by your health care provider. Make sure you discuss any questions you have with your health care provider. Document Revised: 06/26/2017 Document Reviewed: 12/21/2015 Elsevier Patient Education  2020 [employer].

## 2020-01-25 NOTE — Chronic Care Management (AMB) (Signed)
Chronic Care Management Pharmacy  Name: [name] [name]  MRN: [id number] DOB: 1940-08-31  Chief Complaint/ HPI  [name] [name],  79 y.o. , female presents for their Follow-Up CCM visit with the clinical pharmacist via telephone due to COVID-19 Pandemic.  PCP : Binnie Rail, MD  Their chronic conditions include: HTN, HLD, GERD, IBS, hypothyroidism, osteoporosis, prediabetes, DDD  Office Visits: 11/07/19 Dr Quay Burow OV: F/U and back pain. Return to [employer], may add stool softener for constipation. Xray for back pain. Referred to sports med/ortho.  08/02/19 Dr Quay Burow: pt stable, no med changes. CMP wnl.   Consult Visit: 11/18/19 Dr Georgina Snell (sports med): Xray w/ degenerative changes. Plan PT. Rx baclofen 5 mg.  06/16/19 Dr Matthew Saras (OBGYN): via claims data. 06/15/19 NP Valere Dross (IM): COVID screening, no action taken.  Medications: Outpatient Encounter Medications as of 01/25/2020  Medication Sig  . aspirin 81 MG EC tablet Take 81 mg by mouth daily. Ecotrin  . baclofen (LIORESAL) 10 MG tablet Take 0.5-1 tablets (5-10 mg total) by mouth 3 (three) times daily as needed for muscle spasms.  . Biotin 5 MG CAPS Taking daily  . bisoprolol-hydrochlorothiazide (ZIAC) 5-6.25 MG tablet TAKE 1 TABLET BY MOUTH EVERY DAY  . calcium-vitamin D (OSCAL WITH D) 500-200 MG-UNIT per tablet Take 1 tablet by mouth 2 (two) times daily.   . clobetasol ointment (TEMOVATE) 9.70 % Apply 1 application topically 2 (two) times daily.  [name] denosumab (PROLIA) 60 MG/ML SOLN injection Inject 60 mg into the skin every 6 (six) months. Administer in upper arm, thigh, or abdomen  . famotidine (PEPCID) 20 MG tablet Take 20 mg by mouth daily.  [name] ibuprofen (ADVIL,MOTRIN) 200 MG tablet Take 600 mg by mouth 3 (three) times daily. With meals  . levothyroxine (SYNTHROID) 25 MCG tablet TAKE 1 TABLET BY MOUTH EVERY DAY BEFORE BREAKFAST  . Multiple Vitamins-Minerals (CENTRUM SILVER PO) Take by mouth daily.    [name] omeprazole (PRILOSEC) 20 MG  capsule Take 1 capsule (20 mg total) by mouth daily.  . phenylephrine-shark liver oil-mineral oil-petrolatum (PREPARATION H) 0.25-3-14-71.9 % rectal ointment Place 1 application rectally 2 (two) times daily as needed for hemorrhoids.  . polyethylene glycol (MIRALAX / GLYCOLAX) packet Take 17 g by mouth daily. 2 capfuls daily  . pravastatin (PRAVACHOL) 10 MG tablet TAKE 1 TABLET BY MOUTH EVERY DAY  . Wheat Dextrin (BENEFIBER) POWD Twice daily   No facility-administered encounter medications on file as of 01/25/2020.    Current Diagnosis/Assessment:  SDOH Interventions     Most Recent Value  SDOH Interventions  Financial Strain Interventions Intervention Not Indicated     Goals Addressed            This Visit's Progress   . Pharmacy Care Plan       CARE PLAN ENTRY (see longitudinal plan of care for additional care plan information)  Current Barriers:  . Chronic Disease Management support, education, and care coordination needs related to Hypertension, Hyperlipidemia, and GERD   Hypertension BP Readings from Last 3 Encounters:  11/18/19 140/78  11/07/19 (!) 156/84  08/02/19 (!) 166/84 .  Pharmacist Clinical Goal(s): o Over the next 150 days, patient will work with PharmD and providers to maintain BP goal <140/90 . Current regimen:  o bisoprolol/HCTZ 5/6.25 mg daily . Interventions: o Discussed white coat hypertension and BP goals . Patient self care activities - Over the next 150 days, patient will: o Check BP daily, document, and provide at future appointments o Ensure  daily salt intake < 2300 mg/day  Hyperlipidemia Lab Results  Component Value Date/Time   LDLCALC 119 (H) 01/11/2019 10:19 AM   LDLDIRECT 158.0 06/27/2015 11:24 AM .  Pharmacist Clinical Goal(s): o Over the next 150 days, patient will work with PharmD and providers to achieve LDL goal < 100 . Current regimen:  o Pravastatin 10 mg daily . Interventions: o Discussed cholesterol goals and benefits of  medication for prevention of heart attack / stroke . Patient self care activities - Over the next 150 days, patient will: o Continue current medications o Continue low cholesterol diet  Heartburn / GERD . Pharmacist Clinical Goal(s) o Over the next 150 days, patient will work with PharmD and providers to optimize therapy . Current regimen:  o famotidine 20 mg daily,  o omeprazole 20 mg daily,  . Interventions: o Discussed optimal timing for famotidine (Pepcid) . Patient self care activities - Over the next 150 days, patient will: o Take Famotidine 15-30 min before a meal for optimal benefit  Medication management . Pharmacist Clinical Goal(s): o Over the next 150 days, patient will work with PharmD and providers to maintain optimal medication adherence . [facility] . Interventions o Comprehensive medication review performed. o Continue current medication management strategy . Patient self care activities - Over the next 150 days, patient will: o Focus on medication adherence by fill date o Take medications as prescribed o Report any questions or concerns to PharmD and/or provider(s)  Please see past updates related to this goal by clicking on the "Past Updates" button in the selected goal        Hypertension   BP goal < 140/90  Office blood pressures are  BP Readings from Last 3 Encounters:  11/18/19 140/78  11/07/19 (!) 156/84  08/02/19 (!) 166/84   Lab Results  Component Value Date   CREATININE 0.68 08/02/2019   CREATININE 0.66 01/11/2019   CREATININE 0.63 12/15/2017   Patient has failed these meds in the past: n/a Patient is currently controlled on the following meds:   bisoprolol/HCTZ 5/6.25 mg daily  Patient checks BP at home infrequently  Patient home BP readings are ranging: 130/73, 126/75, HR 63  We discussed white coat HTN; BP goals; natural fluctuations in BP and HR;  Plan  Continue current medications and control with diet and  exercise     Hyperlipidemia   LDL goal < 100  Lipid Panel     Component Value Date/Time   CHOL 212 (H) 01/11/2019 1019   TRIG 185.0 (H) 01/11/2019 1019   TRIG 237 (HH) 08/10/2006 0922   HDL 56.10 01/11/2019 1019   CHOLHDL 4 01/11/2019 1019   VLDL 37.0 01/11/2019 1019   LDLCALC 119 (H) 01/11/2019 1019   LDLDIRECT 158.0 06/27/2015 1124   The ASCVD Risk score (Goff DC Jr., et al., 2013) failed to calculate for the following reasons:   The 2013 ASCVD risk score is only valid for ages 57 to 86  Patient has failed these meds in past: simvastatin, ezetimibe Patient is currently uncontrolled on the following medications:   pravastatin 10 mg daily,   We discussed:  Importance of adherence to medications.   Plan  Continue current medications and control with diet and exercise    GERD   Patient has failed these meds in past: ranitidine Patient is currently controlled on the following medications:   famotidine 20 mg daily,   omeprazole 20 mg daily,   We discussed:  Optimal timing of famotidine  before a meal  Plan  Continue current medications   Constipation   Patient has failed these meds in past: n/a Patient is currently controlled on the following medications:   Miralax once daily  Benefiber BID  Docusate 100 mg PRN  We discussed:  Generic vs Brand name products; pt has tried both and has not noticed much difference between them; Per visit with Dr Quay Burow in April constipation worsened on generic Miralax and Brand Miralax was recommended  Plan  Continue current medications  Back pain   Degenerative changes Xray 10/2019. Started PT.  Patient has failed these meds in past: n/a Patient is currently controlled on the following medications:  . Baclofen 5-10 mg TID PRN - not taking  We discussed:  Pt is not taking baclofen anymore; back pain is much better after going to physical therapy.  Plan  Continue control with diet and exercise  Osteoporosis    Last DEXA Scan: 01/07/2018  T-Score femoral neck: -2.9  T-Score total hip: n/a  T-Score lumbar spine: -2.5  T-Score forearm radius: n/a  10-year probability of major osteoporotic fracture: n/a  10-year probability of hip fracture: n/a  VITD  Date Value Ref Range Status  01/11/2019 51.42 30.00 - 100.00 ng/mL Final    Patient is a candidate for pharmacologic treatment due to T-Score < -2.5 in femoral neck and T-Score < -2.5 in lumbar spine  Patient has failed these meds in past: n/a Patient is currently controlled on the following medications:  . Prolia q6 months (last 10/20/2019)  calcium-Vitamin D 500-200 BID  We discussed:  Recommend [phone] units of vitamin D daily. Recommend 1200 mg of calcium daily from dietary and supplemental sources. pt has hard time finding [place], recommended to ask for assistance at pharmacy to find store brand  Plan  Continue current medications and control with diet and exercise   Medication Management   [facility] Does not use pill box - sets up meds for the day each morning to help her remember if she's taken med already or not Pt endorses 100% compliance   We discussed:  Benefits of med synchronization, packaging and delivery with [facility]. [facility].  Plan  Continue current med management strategy   Follow up: 5 month phone visit  Charlene Brooke, PharmD Clinical Pharmacist [facility] [phone]

## 2020-03-11 NOTE — Patient Instructions (Addendum)
  Blood work was ordered.     Medications reviewed and updated.  Changes include :   None     Please followup in 6 months   

## 2020-03-11 NOTE — Progress Notes (Signed)
Subjective:    Patient ID: [name] [name], female    DOB: 12-21-1940, 79 y.o.   MRN: [id number]  HPI The patient is here for follow up of their chronic medical problems, including htn, hyperlipidemia, GERD, hypothyroidism, OP on prolia, prediabetes  She is not exercising regularly.     She is taking all of her medications as prescribed.     Left upper lateral arm pain - started after covid vaccine in that arm.    BP at home is lower - 115/69 - 136/76  Medications and allergies reviewed with patient and updated if appropriate.  Patient Active Problem List   Diagnosis Date Noted  . Lower back pain 11/07/2019  . Closed compression fracture of lumbar vertebra ([place]), T11, T12, L1 12/30/2017  . Lung nodules, stable, no f/u needed 12/30/2017  . Hypothyroidism 11/01/2016  . Constipation 04/30/2016  . Prediabetes 11/01/2015  . Spinal stenosis of lumbar region 10/03/2015  . DDD (degenerative disc disease), lumbar   . DJD (degenerative joint disease) of knee   . Hypertension 11/25/2010  . History of colon polyps 05/14/2009  . G E R D 08/17/2007  . Hyperlipidemia 08/16/2007  . HIATAL HERNIA 08/16/2007  . DIVERTICULAR DISEASE 08/16/2007  . IBS 08/16/2007  . Osteoporosis 08/16/2007    Current Outpatient Medications on File Prior to Visit  Medication Sig Dispense Refill  . aspirin 81 MG EC tablet Take 81 mg by mouth daily. Ecotrin    . baclofen (LIORESAL) 10 MG tablet Take 0.5-1 tablets (5-10 mg total) by mouth 3 (three) times daily as needed for muscle spasms. 60 each 1  . Biotin 5 MG CAPS Taking daily  0  . bisoprolol-hydrochlorothiazide (ZIAC) 5-6.25 MG tablet TAKE 1 TABLET BY MOUTH EVERY DAY 90 tablet 1  . calcium-vitamin D (OSCAL WITH D) 500-200 MG-UNIT per tablet Take 1 tablet by mouth 2 (two) times daily.     . clobetasol ointment (TEMOVATE) 3.49 % Apply 1 application topically 2 (two) times daily.    [name] denosumab (PROLIA) 60 MG/ML SOLN injection Inject 60 mg into the skin  every 6 (six) months. Administer in upper arm, thigh, or abdomen    . famotidine (PEPCID) 20 MG tablet Take 20 mg by mouth daily.    [name] ibuprofen (ADVIL,MOTRIN) 200 MG tablet Take 600 mg by mouth 3 (three) times daily. With meals    . levothyroxine (SYNTHROID) 25 MCG tablet TAKE 1 TABLET BY MOUTH EVERY DAY BEFORE BREAKFAST 90 tablet 1  . Multiple Vitamins-Minerals (CENTRUM SILVER PO) Take by mouth daily.      [name] omeprazole (PRILOSEC) 20 MG capsule Take 1 capsule (20 mg total) by mouth daily. 90 capsule 3  . phenylephrine-shark liver oil-mineral oil-petrolatum (PREPARATION H) 0.25-3-14-71.9 % rectal ointment Place 1 application rectally 2 (two) times daily as needed for hemorrhoids.    . polyethylene glycol (MIRALAX / GLYCOLAX) packet Take 17 g by mouth daily. 2 capfuls daily    . pravastatin (PRAVACHOL) 10 MG tablet TAKE 1 TABLET BY MOUTH EVERY DAY 90 tablet 1  . Wheat Dextrin (BENEFIBER) POWD Twice daily  0   No current facility-administered medications on file prior to visit.    Past Medical History:  Diagnosis Date  . Benign neoplasm of colon   . Cataract    bilateral,had surgery  . DDD (degenerative disc disease), lumbar    MRI w. [facility] 05/2015, s/p ESI x 1 w/ improvement  . Diaphragmatic hernia without mention of obstruction or gangrene   .  Diverticulosis of colon (without mention of hemorrhage)   . DJD (degenerative joint disease) of knee    L>R, IA cortisone by [facility] in L 03/2015  . Esophageal reflux   . Gallstones   . History of chicken pox   . Hyperlipidemia   . Hypertension   . Irritable bowel syndrome   . Migraines   . Obesity, unspecified   . Osteoporosis, unspecified   . Tachycardia   . Unspecified hemorrhoids with other complication     Past Surgical History:  Procedure Laterality Date  . APPENDECTOMY  1979  . CHOLECYSTECTOMY  1989  . COLONOSCOPY    . POLYPECTOMY    . VAGINAL HYSTERECTOMY  1996    Social History   Socioeconomic History  . Marital  status: Married    Spouse name: Not on file  . Number of children: 2  . Years of education: Not on file  . Highest education level: Not on file  Occupational History  . Occupation: retired  Tobacco Use  . Smoking status: Never Smoker  . Smokeless tobacco: Never Used  Vaping Use  . Vaping Use: Never used  Substance and Sexual Activity  . Alcohol use: No    Alcohol/week: 0.0 standard drinks  . Drug use: No  . Sexual activity: Not Currently  Other Topics Concern  . Not on file  Social History Narrative   Exercise: no regular exercise   Social Determinants of Health   Financial Resource Strain: Low Risk   . Difficulty of Paying Living Expenses: Not hard at all  Food Insecurity:   . Worried About Charity fundraiser in the Last Year:   . Arboriculturist in the Last Year:   Transportation Needs:   . Film/video editor (Medical):   [name] Lack of Transportation (Non-Medical):   Physical Activity:   . Days of Exercise per Week:   . Minutes of Exercise per Session:   Stress:   . Feeling of Stress :   Social Connections:   . Frequency of Communication with Friends and Family:   . Frequency of Social Gatherings with Friends and Family:   . Attends Religious Services:   . Active Member of Clubs or Organizations:   . Attends Archivist Meetings:   [name] Marital Status:     Family History  Problem Relation Age of Onset  . Heart disease Sister        x 2  . Heart disease Brother   . Stroke Mother   . Colon cancer Sister   . Stroke Sister 47    Review of Systems  Constitutional: Negative for chills and fever.  Respiratory: Negative for cough, shortness of breath and wheezing.   Cardiovascular: Negative for chest pain, palpitations and leg swelling.  Neurological: Negative for light-headedness and headaches.       Objective:   Vitals:   03/12/20 1112  BP: (!) 148/78  Pulse: 69  Temp: 98.1 F (36.7 C)  SpO2: 99%   BP Readings from Last 3 Encounters:    03/12/20 (!) 148/78  11/18/19 140/78  11/07/19 (!) 156/84   Wt Readings from Last 3 Encounters:  03/12/20 135 lb (61.2 kg)  11/18/19 135 lb 12.8 oz (61.6 kg)  11/07/19 136 lb 3.2 oz (61.8 kg)   Body mass index is 23.91 kg/m.   Physical Exam    Constitutional: Appears well-developed and well-nourished. No distress.  HENT:  Head: Normocephalic and atraumatic.  Neck: Neck supple. No  tracheal deviation present. No thyromegaly present.  No cervical lymphadenopathy Cardiovascular: Normal rate, regular rhythm and normal heart sounds.   No murmur heard. No carotid bruit .  No edema Pulmonary/Chest: Effort normal and breath sounds normal. No respiratory distress. No has no wheezes. No rales.  Skin: Skin is warm and dry. Not diaphoretic.  Psychiatric: Normal mood and affect. Behavior is normal.      Assessment & Plan:    See Problem List for Assessment and Plan of chronic medical problems.    This visit occurred during the SARS-CoV-2 public health emergency.  Safety protocols were in place, including screening questions prior to the visit, additional usage of staff PPE, and extensive cleaning of exam room while observing appropriate contact time as indicated for disinfecting solutions.

## 2020-03-12 ENCOUNTER — Ambulatory Visit (INDEPENDENT_AMBULATORY_CARE_PROVIDER_SITE_OTHER): Payer: PPO | Admitting: Internal Medicine

## 2020-03-12 ENCOUNTER — Encounter: Payer: Self-pay | Admitting: Internal Medicine

## 2020-03-12 ENCOUNTER — Other Ambulatory Visit: Payer: Self-pay

## 2020-03-12 VITALS — BP 148/78 | HR 69 | Temp 98.1°F | Ht 63.0 in | Wt 135.0 lb

## 2020-03-12 DIAGNOSIS — E038 Other specified hypothyroidism: Secondary | ICD-10-CM | POA: Diagnosis not present

## 2020-03-12 DIAGNOSIS — R7303 Prediabetes: Secondary | ICD-10-CM | POA: Diagnosis not present

## 2020-03-12 DIAGNOSIS — K219 Gastro-esophageal reflux disease without esophagitis: Secondary | ICD-10-CM

## 2020-03-12 DIAGNOSIS — M810 Age-related osteoporosis without current pathological fracture: Secondary | ICD-10-CM

## 2020-03-12 DIAGNOSIS — I10 Essential (primary) hypertension: Secondary | ICD-10-CM | POA: Diagnosis not present

## 2020-03-12 DIAGNOSIS — E063 Autoimmune thyroiditis: Secondary | ICD-10-CM | POA: Diagnosis not present

## 2020-03-12 DIAGNOSIS — E7849 Other hyperlipidemia: Secondary | ICD-10-CM | POA: Diagnosis not present

## 2020-03-12 NOTE — Assessment & Plan Note (Signed)
Chronic Check lipid panel  Continue daily statin Regular exercise and healthy diet encouraged  

## 2020-03-12 NOTE — Assessment & Plan Note (Signed)
Chronic  Clinically euthyroid Check tsh  Titrate med dose if needed  

## 2020-03-12 NOTE — Assessment & Plan Note (Signed)
Chronic GERD controlled, occ GERD only Continue daily medication

## 2020-03-12 NOTE — Assessment & Plan Note (Signed)
Chronic Check a1c Low sugar / carb diet Stressed regular exercise  

## 2020-03-12 NOTE — Assessment & Plan Note (Addendum)
Chronic On prolia - due next month Taking calcium and vitamin d Exercising some Ck vitamin d level

## 2020-03-12 NOTE — Assessment & Plan Note (Signed)
Chronic BP well controlled at home Current regimen effective and well tolerated Continue current medications at current doses cmp

## 2020-03-13 LAB — COMPLETE METABOLIC PANEL WITH GFR
AG Ratio: 1.6 (calc) (ref 1.0–2.5)
ALT: 13 U/L (ref 6–29)
AST: 17 U/L (ref 10–35)
Albumin: 4.5 g/dL (ref 3.6–5.1)
Alkaline phosphatase (APISO): 40 U/L (ref 37–153)
BUN: 12 mg/dL (ref 7–25)
CO2: 32 mmol/L (ref 20–32)
Calcium: 10.9 mg/dL — ABNORMAL HIGH (ref 8.6–10.4)
Chloride: 101 mmol/L (ref 98–110)
Creat: 0.75 mg/dL (ref 0.60–0.88)
GFR, Est African American: 87 mL/min/{1.73_m2} (ref >=60)
GFR, Est Non African American: 75 mL/min/{1.73_m2} (ref >=60)
Globulin: 2.8 g/dL (calc) (ref 1.9–3.7)
Glucose, Bld: 86 mg/dL (ref 65–99)
Potassium: 4.6 mmol/L (ref 3.5–5.3)
Sodium: 141 mmol/L (ref 135–146)
Total Bilirubin: 1.2 mg/dL (ref 0.2–1.2)
Total Protein: 7.3 g/dL (ref 6.1–8.1)

## 2020-03-13 LAB — CBC WITH DIFFERENTIAL/PLATELET
Absolute Monocytes: 679 cells/uL (ref 200–950)
Basophils Absolute: 63 cells/uL (ref 0–200)
Basophils Relative: 0.9 %
Eosinophils Absolute: 210 cells/uL (ref 15–500)
Eosinophils Relative: 3 %
HCT: 43.4 % (ref 35.0–45.0)
Hemoglobin: 14.2 g/dL (ref 11.7–15.5)
Lymphs Abs: 2149 cells/uL (ref 850–3900)
MCH: 30.1 pg (ref 27.0–33.0)
MCHC: 32.7 g/dL (ref 32.0–36.0)
MCV: 91.9 fL (ref 80.0–100.0)
MPV: 11.4 fL (ref 7.5–12.5)
Monocytes Relative: 9.7 %
Neutro Abs: 3899 cells/uL (ref 1500–7800)
Neutrophils Relative %: 55.7 %
Platelets: 241 10*3/uL (ref 140–400)
RBC: 4.72 10*6/uL (ref 3.80–5.10)
RDW: 12.3 % (ref 11.0–15.0)
Total Lymphocyte: 30.7 %
WBC: 7 10*3/uL (ref 3.8–10.8)

## 2020-03-13 LAB — LIPID PANEL
Cholesterol: 225 mg/dL — ABNORMAL HIGH (ref <200)
HDL: 57 mg/dL (ref >=50)
LDL Cholesterol (Calc): 138 mg/dL (calc) — ABNORMAL HIGH
Non-HDL Cholesterol (Calc): 168 mg/dL (calc) — ABNORMAL HIGH (ref <130)
Total CHOL/HDL Ratio: 3.9 (calc) (ref <5.0)
Triglycerides: 166 mg/dL — ABNORMAL HIGH (ref <150)

## 2020-03-13 LAB — VITAMIN D 25 HYDROXY (VIT D DEFICIENCY, FRACTURES): Vit D, 25-Hydroxy: 31 ng/mL (ref 30–100)

## 2020-03-13 LAB — TSH: TSH: 1.41 mIU/L (ref 0.40–4.50)

## 2020-03-13 LAB — HEMOGLOBIN A1C
Hgb A1c MFr Bld: 5.5 % of total Hgb (ref <5.7)
Mean Plasma Glucose: 111 (calc)
eAG (mmol/L): 6.2 (calc)

## 2020-03-15 ENCOUNTER — Telehealth: Payer: Self-pay | Admitting: Pharmacist

## 2020-03-15 ENCOUNTER — Other Ambulatory Visit: Payer: Self-pay

## 2020-03-15 MED ORDER — PRAVASTATIN SODIUM 20 MG PO TABS: 20.0000 mg | ORAL_TABLET | Freq: Every day | ORAL | 1 refills | Status: DC

## 2020-03-15 NOTE — Progress Notes (Signed)
    Chronic Care Management Pharmacy Assistant   Name: [name] [name]  MRN: [id number] DOB: 02/18/1941  Reason for Encounter: [facility] Follow up   Patient Questions:  PCP : Binnie Rail, MD  Allergies:  No Known Allergies  Medications: Outpatient Encounter Medications as of 03/15/2020  Medication Sig  . aspirin 81 MG EC tablet Take 81 mg by mouth daily. Ecotrin  . baclofen (LIORESAL) 10 MG tablet Take 0.5-1 tablets (5-10 mg total) by mouth 3 (three) times daily as needed for muscle spasms.  . Biotin 5 MG CAPS Taking daily  . bisoprolol-hydrochlorothiazide (ZIAC) 5-6.25 MG tablet TAKE 1 TABLET BY MOUTH EVERY DAY  . calcium-vitamin D (OSCAL WITH D) 500-200 MG-UNIT per tablet Take 1 tablet by mouth 2 (two) times daily.   . clobetasol ointment (TEMOVATE) 7.37 % Apply 1 application topically 2 (two) times daily.  [name] denosumab (PROLIA) 60 MG/ML SOLN injection Inject 60 mg into the skin every 6 (six) months. Administer in upper arm, thigh, or abdomen  . famotidine (PEPCID) 20 MG tablet Take 20 mg by mouth daily.  [name] ibuprofen (ADVIL,MOTRIN) 200 MG tablet Take 600 mg by mouth 3 (three) times daily. With meals  . levothyroxine (SYNTHROID) 25 MCG tablet TAKE 1 TABLET BY MOUTH EVERY DAY BEFORE BREAKFAST  . Multiple Vitamins-Minerals (CENTRUM SILVER PO) Take by mouth daily.    [name] omeprazole (PRILOSEC) 20 MG capsule Take 1 capsule (20 mg total) by mouth daily.  . phenylephrine-shark liver oil-mineral oil-petrolatum (PREPARATION H) 0.25-3-14-71.9 % rectal ointment Place 1 application rectally 2 (two) times daily as needed for hemorrhoids.  . polyethylene glycol (MIRALAX / GLYCOLAX) packet Take 17 g by mouth daily. 2 capfuls daily  . pravastatin (PRAVACHOL) 10 MG tablet TAKE 1 TABLET BY MOUTH EVERY DAY  . Wheat Dextrin (BENEFIBER) POWD Twice daily   No facility-administered encounter medications on file as of 03/15/2020.    Current Diagnosis: Patient Active Problem List   Diagnosis Date Noted    . Lower back pain 11/07/2019  . Closed compression fracture of lumbar vertebra ([place]), T11, T12, L1 12/30/2017  . Lung nodules, stable, no f/u needed 12/30/2017  . Hypothyroidism 11/01/2016  . Constipation 04/30/2016  . Prediabetes 11/01/2015  . Spinal stenosis of lumbar region 10/03/2015  . DDD (degenerative disc disease), lumbar   . DJD (degenerative joint disease) of knee   . Hypertension 11/25/2010  . History of colon polyps 05/14/2009  . G E R D 08/17/2007  . Hyperlipidemia 08/16/2007  . HIATAL HERNIA 08/16/2007  . DIVERTICULAR DISEASE 08/16/2007  . IBS 08/16/2007  . Osteoporosis 08/16/2007    Goals Addressed   None     Follow-Up:  Pharmacist Review      Per Clinical Pharmacist I was instructed to follow up with the patients grant with the [employer]. The patient has $320 left for her balance and the last day her grant is active is on 07/13/2020. Will forward information to pharmacist.     Rosendo Gros, [facility] Pharmacist Assistant  [phone]

## 2020-03-22 ENCOUNTER — Ambulatory Visit (INDEPENDENT_AMBULATORY_CARE_PROVIDER_SITE_OTHER)
Admission: RE | Admit: 2020-03-22 | Discharge: 2020-03-22 | Disposition: A | Discharge status: Home / Self Care | Payer: PPO | Source: Ambulatory Visit | Attending: Internal Medicine | Admitting: Internal Medicine

## 2020-03-22 ENCOUNTER — Other Ambulatory Visit: Payer: Self-pay

## 2020-03-22 DIAGNOSIS — M810 Age-related osteoporosis without current pathological fracture: Secondary | ICD-10-CM

## 2020-03-26 ENCOUNTER — Telehealth: Payer: Self-pay | Admitting: Internal Medicine

## 2020-03-26 MED ORDER — DENOSUMAB 60 MG/ML ~~LOC~~ SOSY
60.0000 mg | PREFILLED_SYRINGE | Freq: Once | SUBCUTANEOUS | 0 refills | Status: AC
Start: 2020-03-26 — End: 2020-03-26

## 2020-03-26 NOTE — Telephone Encounter (Signed)
Patient will be due for Prolia on 04/22/2020.  She using the [facility] and did confirm that she still has funds available to help with her copay. A prescription for Prolia will need to be sent to [place] so that it can be filled there and mailed to [place].  Can you send this over for her?  She also asked with the possible 3rd COVID Vaccine becoming available, if it were to be near the same time ask her Prolia injection, how long should she wait between the two?

## 2020-03-26 NOTE — Telephone Encounter (Signed)
Which specialty pharmacy ?  There are more than one listed as possible option.

## 2020-03-27 NOTE — Telephone Encounter (Signed)
Please let patient know [facility] was [facility].  She needs to be scheduled for 04/22/2020 for the injection here.   Her recent bone density showed improvement in her bone density in her spine and minimal decrease in density in her hips.  Continue Prolia every 6 months.  Continue calcium and vitamin D.  Continue regular exercise.  Repeat bone density in 2 years.

## 2020-03-27 NOTE — Telephone Encounter (Signed)
I believe [facility] will work.

## 2020-03-28 NOTE — Telephone Encounter (Signed)
Notified pt w/MD response. Made appt for nurse visit for 04/23/20,.../lmb

## 2020-04-05 ENCOUNTER — Other Ambulatory Visit: Payer: Self-pay | Admitting: Internal Medicine

## 2020-04-23 ENCOUNTER — Ambulatory Visit (INDEPENDENT_AMBULATORY_CARE_PROVIDER_SITE_OTHER): Payer: PPO

## 2020-04-23 ENCOUNTER — Other Ambulatory Visit: Payer: Self-pay

## 2020-04-23 DIAGNOSIS — M810 Age-related osteoporosis without current pathological fracture: Secondary | ICD-10-CM

## 2020-04-23 MED ORDER — DENOSUMAB 60 MG/ML ~~LOC~~ SOSY
60.0000 mg | PREFILLED_SYRINGE | Freq: Once | SUBCUTANEOUS | Status: AC
  Administered 2020-04-23: 60 mg via SUBCUTANEOUS

## 2020-04-23 NOTE — Progress Notes (Signed)
Prolia given

## 2020-05-07 DIAGNOSIS — L900 Lichen sclerosus et atrophicus: Secondary | ICD-10-CM | POA: Diagnosis not present

## 2020-05-07 DIAGNOSIS — D225 Melanocytic nevi of trunk: Secondary | ICD-10-CM | POA: Diagnosis not present

## 2020-05-07 DIAGNOSIS — L820 Inflamed seborrheic keratosis: Secondary | ICD-10-CM | POA: Diagnosis not present

## 2020-05-07 DIAGNOSIS — L821 Other seborrheic keratosis: Secondary | ICD-10-CM | POA: Diagnosis not present

## 2020-05-07 DIAGNOSIS — D1801 Hemangioma of skin and subcutaneous tissue: Secondary | ICD-10-CM | POA: Diagnosis not present

## 2020-05-07 DIAGNOSIS — L814 Other melanin hyperpigmentation: Secondary | ICD-10-CM | POA: Diagnosis not present

## 2020-06-06 ENCOUNTER — Other Ambulatory Visit: Payer: Self-pay | Admitting: Internal Medicine

## 2020-06-11 ENCOUNTER — Other Ambulatory Visit: Payer: Self-pay

## 2020-06-11 ENCOUNTER — Ambulatory Visit (INDEPENDENT_AMBULATORY_CARE_PROVIDER_SITE_OTHER): Payer: PPO

## 2020-06-11 DIAGNOSIS — Z23 Encounter for immunization: Secondary | ICD-10-CM | POA: Diagnosis not present

## 2020-06-19 ENCOUNTER — Ambulatory Visit: Payer: PPO | Admitting: Pharmacist

## 2020-06-19 ENCOUNTER — Other Ambulatory Visit: Payer: Self-pay

## 2020-06-19 DIAGNOSIS — E7849 Other hyperlipidemia: Secondary | ICD-10-CM

## 2020-06-19 DIAGNOSIS — K219 Gastro-esophageal reflux disease without esophagitis: Secondary | ICD-10-CM

## 2020-06-19 DIAGNOSIS — I10 Essential (primary) hypertension: Secondary | ICD-10-CM

## 2020-06-19 NOTE — Patient Instructions (Signed)
Visit Information  Phone number for Pharmacist: [phone]  Goals Addressed            This Visit's Progress   . Pharmacy Care Plan       CARE PLAN ENTRY (see longitudinal plan of care for additional care plan information)  Current Barriers:  . Chronic Disease Management support, education, and care coordination needs related to Hypertension, Hyperlipidemia, and GERD   Hypertension BP Readings from Last 3 Encounters:  11/18/19 140/78  11/07/19 (!) 156/84  08/02/19 (!) 166/84 .  Pharmacist Clinical Goal(s): o Over the next 180 days, patient will work with PharmD and providers to maintain BP goal <140/90 . Current regimen:  o bisoprolol/HCTZ 5/6.25 mg daily . Interventions: o Discussed [place] hypertension o Discussed BP goals and benefits of medications for prevention of heart attack / stroke . Patient self care activities - Over the next 180 days, patient will: o Check BP daily, document, and provide at future appointments o Ensure daily salt intake < 2300 mg/day  Hyperlipidemia Lab Results  Component Value Date/Time   LDLCALC 119 (H) 01/11/2019 10:19 AM   LDLDIRECT 158.0 06/27/2015 11:24 AM .  Pharmacist Clinical Goal(s): o Over the next 180 days, patient will work with PharmD and providers to achieve LDL goal < 100 . Current regimen:  o Pravastatin 20 mg daily . Interventions: o Discussed cholesterol goals and benefits of medication for prevention of heart attack / stroke o Trial off of pravastatin led to no improvement in leg pain; discussed pravastatin is not the cause and patient should resume medication . Patient self care activities - Over the next 180 days, patient will: o Continue current medications o Continue low cholesterol diet  Heartburn / GERD . Pharmacist Clinical Goal(s) o Over the next 180 days, patient will work with PharmD and providers to optimize therapy . Current regimen:  o famotidine 20 mg daily,  o omeprazole 20 mg daily,   . Interventions: o Discussed optimal timing for famotidine (Pepcid) and omeprazole . Patient self care activities - Over the next 180 days, patient will: o Take Famotidine 15-30 min before a meal for optimal benefit  Medication management . Pharmacist Clinical Goal(s): o Over the next 180 days, patient will work with PharmD and providers to maintain optimal medication adherence . [facility] . Interventions o Comprehensive medication review performed. o Continue current medication management strategy . Patient self care activities - Over the next 180 days, patient will: o Focus on medication adherence by fill date o Take medications as prescribed o Report any questions or concerns to PharmD and/or provider(s)  Please see past updates related to this goal by clicking on the "Past Updates" button in the selected goal       The patient verbalized understanding of instructions, educational materials, and care plan provided today and declined offer to receive copy of patient instructions, educational materials, and care plan.  Telephone follow up appointment with pharmacy team member scheduled for: 6 months  Charlene Brooke, PharmD, [facility] Clinical Pharmacist [facility] [phone]

## 2020-06-19 NOTE — Chronic Care Management (AMB) (Signed)
Chronic Care Management Pharmacy  Name: [name] [name]  MRN: [id number] DOB: October 12, 1940  Chief Complaint/ HPI  [name] [name],  79 y.o. , female presents for their Follow-Up CCM visit with the clinical pharmacist via telephone due to COVID-19 Pandemic.  PCP : Binnie Rail, MD  Their chronic conditions include: HTN, HLD, GERD, IBS, hypothyroidism, osteoporosis, prediabetes, DDD  Office Visits: 03/12/20 Dr Quay Burow OV: chronic f/u. Increased pravastatin to 20 mg, increased vitamin D by 1000 units.  11/07/19 Dr Quay Burow OV: F/U and back pain. Return to [employer], may add stool softener for constipation. Xray for back pain. Referred to sports med/ortho.  08/02/19 Dr Quay Burow: pt stable, no med changes. CMP wnl.   Consult Visit: 11/18/19 Dr Georgina Snell (sports med): Xray w/ degenerative changes. Plan PT. Rx baclofen 5 mg.  06/16/19 Dr Matthew Saras (OBGYN): via claims data. 06/15/19 NP Valere Dross (IM): COVID screening, no action taken.   No Known Allergies  Medications: Outpatient Encounter Medications as of 06/19/2020  Medication Sig  . Biotin 5 MG CAPS Taking daily  . bisoprolol-hydrochlorothiazide (ZIAC) 5-6.25 MG tablet TAKE 1 TABLET BY MOUTH EVERY DAY  . calcium-vitamin D (OSCAL WITH D) 500-200 MG-UNIT per tablet Take 1 tablet by mouth 2 (two) times daily.   . clobetasol ointment (TEMOVATE) 1.30 % Apply 1 application topically 2 (two) times daily.  [name] denosumab (PROLIA) 60 MG/ML SOLN injection Inject 60 mg into the skin every 6 (six) months. Administer in upper arm, thigh, or abdomen  . famotidine (PEPCID) 20 MG tablet Take 20 mg by mouth daily.  [name] ibuprofen (ADVIL,MOTRIN) 200 MG tablet Take 600 mg by mouth 3 (three) times daily. With meals  . levothyroxine (SYNTHROID) 25 MCG tablet TAKE 1 TABLET BY MOUTH EVERY DAY BEFORE BREAKFAST  . Multiple Vitamins-Minerals (CENTRUM SILVER PO) Take by mouth daily.    [name] omeprazole (PRILOSEC) 20 MG capsule Take 1 capsule (20 mg total) by mouth daily.  .  phenylephrine-shark liver oil-mineral oil-petrolatum (PREPARATION H) 0.25-3-14-71.9 % rectal ointment Place 1 application rectally 2 (two) times daily as needed for hemorrhoids.  . polyethylene glycol (MIRALAX / GLYCOLAX) packet Take 17 g by mouth daily. 2 capfuls daily  . pravastatin (PRAVACHOL) 20 MG tablet Take 1 tablet (20 mg total) by mouth daily.  . Wheat Dextrin (BENEFIBER) POWD Twice daily  . [DISCONTINUED] aspirin 81 MG EC tablet Take 81 mg by mouth daily. Ecotrin  . [DISCONTINUED] baclofen (LIORESAL) 10 MG tablet Take 0.5-1 tablets (5-10 mg total) by mouth 3 (three) times daily as needed for muscle spasms. (Patient not taking: Reported on 06/19/2020)   No facility-administered encounter medications on file as of 06/19/2020.   Wt Readings from Last 3 Encounters:  03/12/20 135 lb (61.2 kg)  11/18/19 135 lb 12.8 oz (61.6 kg)  11/07/19 136 lb 3.2 oz (61.8 kg)    Current Diagnosis/Assessment:   Goals Addressed            This Visit's Progress   . Pharmacy Care Plan       CARE PLAN ENTRY (see longitudinal plan of care for additional care plan information)  Current Barriers:  . Chronic Disease Management support, education, and care coordination needs related to Hypertension, Hyperlipidemia, and GERD   Hypertension BP Readings from Last 3 Encounters:  11/18/19 140/78  11/07/19 (!) 156/84  08/02/19 (!) 166/84 .  Pharmacist Clinical Goal(s): o Over the next 180 days, patient will work with PharmD and providers to maintain BP goal <140/90 . Current regimen:  o bisoprolol/HCTZ 5/6.25 mg daily . Interventions: o Discussed white coat hypertension o Discussed BP goals and benefits of medications for prevention of heart attack / stroke . Patient self care activities - Over the next 180 days, patient will: o Check BP daily, document, and provide at future appointments o Ensure daily salt intake < 2300 mg/day  Hyperlipidemia Lab Results  Component Value Date/Time   LDLCALC  119 (H) 01/11/2019 10:19 AM   LDLDIRECT 158.0 06/27/2015 11:24 AM .  Pharmacist Clinical Goal(s): o Over the next 180 days, patient will work with PharmD and providers to achieve LDL goal < 100 . Current regimen:  o Pravastatin 20 mg daily . Interventions: o Discussed cholesterol goals and benefits of medication for prevention of heart attack / stroke o Trial off of pravastatin led to no improvement in leg pain; discussed pravastatin is not the cause and patient should resume medication . Patient self care activities - Over the next 180 days, patient will: o Continue current medications o Continue low cholesterol diet  Heartburn / GERD . Pharmacist Clinical Goal(s) o Over the next 180 days, patient will work with PharmD and providers to optimize therapy . Current regimen:  o famotidine 20 mg daily,  o omeprazole 20 mg daily,  . Interventions: o Discussed optimal timing for famotidine (Pepcid) and omeprazole . Patient self care activities - Over the next 180 days, patient will: o Take Famotidine 15-30 min before a meal for optimal benefit  Medication management . Pharmacist Clinical Goal(s): o Over the next 180 days, patient will work with PharmD and providers to maintain optimal medication adherence . Current [facility] . Interventions o Comprehensive medication review performed. o Continue current medication management strategy . Patient self care activities - Over the next 180 days, patient will: o Focus on medication adherence by fill date o Take medications as prescribed o Report any questions or concerns to PharmD and/or provider(s)  Please see past updates related to this goal by clicking on the "Past Updates" button in the selected goal        Hypertension   BP goal < 140/90  Office blood pressures are  BP Readings from Last 3 Encounters:  03/12/20 (!) 148/78  11/18/19 140/78  11/07/19 (!) 156/84   Lab Results  Component Value Date   CREATININE 0.75  03/12/2020   BUN 12 03/12/2020   GFR 83.18 08/02/2019   GFRNONAA 75 03/12/2020   GFRAA 87 03/12/2020   NA 141 03/12/2020   K 4.6 03/12/2020   CALCIUM 10.9 (H) 03/12/2020   CO2 32 03/12/2020   Patient has failed these meds in the past: n/a Patient is currently controlled on the following meds:   bisoprolol/HCTZ 5/6.25 mg daily  Patient checks BP at home infrequently  Patient home BP readings are ranging: 130/73, 126/75, HR 63  We discussed: white coat HTN; BP goals; natural fluctuations in BP and HR; pt denies issues/side effects  Plan  Continue current medications and control with diet and exercise     Hyperlipidemia   LDL goal < 100  Last lipids Lab Results  Component Value Date   CHOL 225 (H) 03/12/2020   HDL 57 03/12/2020   LDLCALC 138 (H) 03/12/2020   LDLDIRECT 158.0 06/27/2015   TRIG 166 (H) 03/12/2020   CHOLHDL 3.9 03/12/2020   Hepatic Function Latest Ref Rng & Units 03/12/2020 08/02/2019 01/11/2019  Total Protein 6.1 - 8.1 g/dL 7.3 7.5 7.3  Albumin 3.5 - 5.2 g/dL - 4.4 4.3  AST 10 - 35 U/L 17 22 18   ALT 6 - 29 U/L 13 14 13   Alk Phosphatase 39 - 117 U/L - 39 38(L)  Total Bilirubin 0.2 - 1.2 mg/dL 1.2 1.3(H) 1.1  Bilirubin, Direct 0.0 - 0.3 mg/dL - - -   The ASCVD Risk score Mikey Bussing DC Jr., et al., 2013) failed to calculate for the following reasons:   The 2013 ASCVD risk score is only valid for ages 46 to 59  Patient has failed these meds in past: simvastatin, ezetimibe Patient is currently uncontrolled on the following medications:   pravastatin 20 mg daily   We discussed: pt reports occasoinal pain in legs upon standing; she stopped pravastatin on her own for a few days without improvement; advised she resume medication since it is not causing the leg pain   Plan  Continue current medications and control with diet and exercise    GERD   Patient has failed these meds in past: ranitidine Patient is currently controlled on the following medications:     famotidine 20 mg daily,   omeprazole 20 mg daily,   We discussed:  Previously patient was not using omeprazole much, more recently she is having issues with stomach pain so she has been taking it daily; she reports mild improvement in stomach pain with this; eating food helps stomach pain more than anything; this sounds more like hunger pains, advised she eats when she is hungry; if no improvement in stomach issues advised to make f/u appt with PCP  Plan  Continue current medications   Vaccines   Reviewed and discussed patient's vaccination history.    Immunization History  Administered Date(s) Administered  . Fluad Quad(high Dose 65+) 05/12/2019, 06/11/2020  . Influenza Split 05/28/2011, 06/23/2012  . Influenza Whole 05/25/2007, 05/31/2010  . Influenza, High Dose Seasonal PF 06/27/2015, 04/30/2016, 05/01/2017, 05/04/2018  . Influenza,inj,Quad PF,6+ Mos 04/20/2013, 06/13/2014  . Moderna SARS-COVID-2 Vaccination 08/08/2019, 09/05/2019  . Pneumococcal Conjugate-13 10/31/2014  . Pneumococcal Polysaccharide-23 10/07/2013  . Td 10/07/2013  . Zoster 10/31/2014    Plan  Recommended patient receive COVID booster 2 weeks after flu shot. Provided [facility] scheduling phone number.  Medication Management   [facility] Does not use pill box - sets up meds for the day each morning to help her remember if she's taken med already or not Pt endorses 100% compliance   We discussed:  Benefits of med synchronization, packaging and delivery with [facility]. [facility] is happy with [facility].  Plan  Continue current med management strategy   Follow up: 6 month phone visit  Charlene Brooke, PharmD, [facility] Clinical Pharmacist [facility] [phone]

## 2020-07-05 ENCOUNTER — Ambulatory Visit: Payer: PPO | Attending: Internal Medicine

## 2020-07-05 DIAGNOSIS — Z23 Encounter for immunization: Secondary | ICD-10-CM

## 2020-07-05 NOTE — Progress Notes (Signed)
   Covid-19 Vaccination Clinic  Name:  LIZZY HAMRE    MRN: [id number] DOB: [birth year]  07/05/2020  Ms. Wherley was observed post Covid-19 immunization for 15 minutes without incident. She was provided with Vaccine Information Sheet and instruction to access the V-Safe system.   Ms. Vary was instructed to call 911 with any severe reactions post vaccine: [name] Difficulty breathing  . Swelling of face and throat  . A fast heartbeat  . A bad rash all over body  . Dizziness and weakness   Immunizations Administered    No immunizations on file.

## 2020-07-12 DIAGNOSIS — Z6821 Body mass index (BMI) 21.0-21.9, adult: Secondary | ICD-10-CM | POA: Diagnosis not present

## 2020-07-12 DIAGNOSIS — N762 Acute vulvitis: Secondary | ICD-10-CM | POA: Diagnosis not present

## 2020-07-12 DIAGNOSIS — Z01419 Encounter for gynecological examination (general) (routine) without abnormal findings: Secondary | ICD-10-CM | POA: Diagnosis not present

## 2020-07-30 ENCOUNTER — Telehealth: Payer: Self-pay | Admitting: Pharmacist

## 2020-07-30 NOTE — Progress Notes (Signed)
° ° °  Chronic Care Management Pharmacy Assistant   Name: BRIHANNA DEVENPORT  MRN: [id number] DOB: 1941/07/25  Reason for Encounter: General Adherence Call   PCP : Pincus Sanes, MD  Allergies:  No Known Allergies  Medications: Outpatient Encounter Medications as of 07/30/2020  Medication Sig   Biotin 5 MG CAPS Taking daily   bisoprolol-hydrochlorothiazide (ZIAC) 5-6.25 MG tablet TAKE 1 TABLET BY MOUTH EVERY DAY   calcium-vitamin D (OSCAL WITH D) 500-200 MG-UNIT per tablet Take 1 tablet by mouth 2 (two) times daily.    clobetasol ointment (TEMOVATE) 0.05 % Apply 1 application topically 2 (two) times daily.   denosumab (PROLIA) 60 MG/ML SOLN injection Inject 60 mg into the skin every 6 (six) months. Administer in upper arm, thigh, or abdomen   famotidine (PEPCID) 20 MG tablet Take 20 mg by mouth daily.   ibuprofen (ADVIL,MOTRIN) 200 MG tablet Take 600 mg by mouth 3 (three) times daily. With meals   levothyroxine (SYNTHROID) 25 MCG tablet TAKE 1 TABLET BY MOUTH EVERY DAY BEFORE BREAKFAST   Multiple Vitamins-Minerals (CENTRUM SILVER PO) Take by mouth daily.     omeprazole (PRILOSEC) 20 MG capsule Take 1 capsule (20 mg total) by mouth daily.   phenylephrine-shark liver oil-mineral oil-petrolatum (PREPARATION H) 0.25-3-14-71.9 % rectal ointment Place 1 application rectally 2 (two) times daily as needed for hemorrhoids.   polyethylene glycol (MIRALAX / GLYCOLAX) packet Take 17 g by mouth daily. 2 capfuls daily   pravastatin (PRAVACHOL) 20 MG tablet Take 1 tablet (20 mg total) by mouth daily.   Wheat Dextrin (BENEFIBER) POWD Twice daily   No facility-administered encounter medications on file as of 07/30/2020.    Current Diagnosis: Patient Active Problem List   Diagnosis Date Noted   Lower back pain 11/07/2019   Closed compression fracture of lumbar vertebra (HCC), T11, T12, L1 12/30/2017   Lung nodules, stable, no f/u needed 12/30/2017   Hypothyroidism 11/01/2016    Constipation 04/30/2016   Prediabetes 11/01/2015   Spinal stenosis of lumbar region 10/03/2015   DDD (degenerative disc disease), lumbar    DJD (degenerative joint disease) of knee    Hypertension 11/25/2010   History of colon polyps 05/14/2009   G E R D 08/17/2007   Hyperlipidemia 08/16/2007   HIATAL HERNIA 08/16/2007   DIVERTICULAR DISEASE 08/16/2007   IBS 08/16/2007   Osteoporosis 08/16/2007    Goals Addressed   None     Follow-Up:  Pharmacist Review   A general adherence wellness call was made to Ms. Groseclose to see how she has been doing since her last visit with the clinical pharmacist Mardella Layman. The patient states that she has been doing well with the normal aches and pains but nothing that's a big deal. The patient states that she has not seen any other provider since her last CPP visit. She did say that Dr. Lawerance Bach did increase her pravastatin from 10 mg to 20 mg. The patient stated that she has not had any recent problems or new issues with her health at this time. She also stated that she is not having any issues getting her medications from the pharmacy. The patient stated that overall she is doing fairly well. I told her that I would pass along the information to Eugenio Saenz.   Horald Pollen, Clinical Pharmacist Assistant [facility]

## 2020-08-01 ENCOUNTER — Ambulatory Visit: Payer: PPO

## 2020-08-08 ENCOUNTER — Ambulatory Visit (INDEPENDENT_AMBULATORY_CARE_PROVIDER_SITE_OTHER): Payer: PPO

## 2020-08-08 ENCOUNTER — Other Ambulatory Visit: Payer: Self-pay

## 2020-08-08 VITALS — BP 140/80 | HR 60 | Temp 98.3°F | Ht 63.0 in | Wt 132.8 lb

## 2020-08-08 DIAGNOSIS — Z0000 Encounter for general adult medical examination without abnormal findings: Secondary | ICD-10-CM

## 2020-08-08 NOTE — Progress Notes (Signed)
Subjective:   [name] [name] is a 80 y.o. female who presents for Medicare Annual (Subsequent) preventive examination.  Review of Systems    No ROS. Medicare Wellness Visit. Additional risk factors are reflected in social history. Cardiac Risk Factors include: advanced age (>85men, >9 women);dyslipidemia;family history of premature cardiovascular disease;hypertension     Objective:    Today's Vitals   08/08/20 1147  BP: 140/80  Pulse: 60  Temp: 98.3 F (36.8 C)  SpO2: 98%  Weight: 132 lb 12.8 oz (60.2 kg)  Height: 5\' 3"  (1.6 m)  PainSc: 0-No pain   Body mass index is 23.52 kg/m.  Advanced Directives 08/08/2020 11/30/2019 12/08/2018 12/01/2017 11/26/2016 10/31/2014  Does Patient Have a Medical Advance Directive? No No Yes Yes Yes No  Type of Advance Directive - Public librarian;Living will [place];Living will [place];Living will -  Copy of [place] in Chart? - - No - copy requested No - copy requested No - copy requested -  Would patient like information on creating a medical advance directive? No - Patient declined No - Patient declined - - - Yes - Educational materials given    Current Medications (verified) Outpatient Encounter Medications as of 08/08/2020  Medication Sig  . Biotin 5 MG CAPS Taking daily  . bisoprolol-hydrochlorothiazide (ZIAC) 5-6.25 MG tablet TAKE 1 TABLET BY MOUTH EVERY DAY  . calcium-vitamin D (OSCAL WITH D) 500-200 MG-UNIT per tablet Take 1 tablet by mouth 2 (two) times daily.  . clobetasol ointment (TEMOVATE) 3.50 % Apply 1 application topically 2 (two) times daily.  [name] denosumab (PROLIA) 60 MG/ML SOLN injection Inject 60 mg into the skin every 6 (six) months. Administer in upper arm, thigh, or abdomen  . famotidine (PEPCID) 20 MG tablet Take 20 mg by mouth daily.  [name] ibuprofen (ADVIL,MOTRIN) 200 MG tablet Take 600 mg by mouth 3 (three) times daily. With meals  . levothyroxine  (SYNTHROID) 25 MCG tablet TAKE 1 TABLET BY MOUTH EVERY DAY BEFORE BREAKFAST  . Multiple Vitamins-Minerals (CENTRUM SILVER PO) Take by mouth daily.  [name] omeprazole (PRILOSEC) 20 MG capsule Take 1 capsule (20 mg total) by mouth daily.  . phenylephrine-shark liver oil-mineral oil-petrolatum (PREPARATION H) 0.25-3-14-71.9 % rectal ointment Place 1 application rectally 2 (two) times daily as needed for hemorrhoids.  . polyethylene glycol (MIRALAX / GLYCOLAX) packet Take 17 g by mouth daily. 2 capfuls daily  . pravastatin (PRAVACHOL) 20 MG tablet Take 1 tablet (20 mg total) by mouth daily.  . Wheat Dextrin (BENEFIBER) POWD Twice daily   No facility-administered encounter medications on file as of 08/08/2020.    Allergies (verified) Patient has no known allergies.   History: Past Medical History:  Diagnosis Date  . Benign neoplasm of colon   . Cataract    bilateral,had surgery  . DDD (degenerative disc disease), lumbar    MRI w. [facility] 05/2015, s/p ESI x 1 w/ improvement  . Diaphragmatic hernia without mention of obstruction or gangrene   . Diverticulosis of colon (without mention of hemorrhage)   . DJD (degenerative joint disease) of knee    L>R, IA cortisone by [facility] in L 03/2015  . Esophageal reflux   . Gallstones   . History of chicken pox   . Hyperlipidemia   . Hypertension   . Irritable bowel syndrome   . Migraines   . Obesity, unspecified   . Osteoporosis, unspecified   . Tachycardia   .  Unspecified hemorrhoids with other complication    Past Surgical History:  Procedure Laterality Date  . APPENDECTOMY  1979  . CHOLECYSTECTOMY  1989  . COLONOSCOPY    . POLYPECTOMY    . VAGINAL HYSTERECTOMY  1996   Family History  Problem Relation Age of Onset  . Heart disease Sister        x 2  . Heart disease Brother   . Stroke Mother   . Colon cancer Sister   . Stroke Sister 29   Social History   Socioeconomic History  . Marital status: Married    Spouse name: Not on  file  . Number of children: 2  . Years of education: Not on file  . Highest education level: Not on file  Occupational History  . Occupation: retired  Tobacco Use  . Smoking status: Never Smoker  . Smokeless tobacco: Never Used  Vaping Use  . Vaping Use: Never used  Substance and Sexual Activity  . Alcohol use: No    Alcohol/week: 0.0 standard drinks  . Drug use: No  . Sexual activity: Not Currently  Other Topics Concern  . Not on file  Social History Narrative   Exercise: no regular exercise   Social Determinants of Health   Financial Resource Strain: Low Risk   . Difficulty of Paying Living Expenses: Not hard at all  Food Insecurity: No Food Insecurity  . Worried About Programme researcher, broadcasting/film/video in the Last Year: Never true  . Ran Out of Food in the Last Year: Never true  Transportation Needs: No Transportation Needs  . Lack of Transportation (Medical): No  . Lack of Transportation (Non-Medical): No  Physical Activity: Inactive  . Days of Exercise per Week: 0 days  . Minutes of Exercise per Session: 0 min  Stress: No Stress Concern Present  . Feeling of Stress : Not at all  Social Connections: Not on file    Tobacco Counseling Counseling given: Not Answered   Clinical Intake:  Pre-visit preparation completed: Yes  Pain : No/denies pain Pain Score: 0-No pain     BMI - recorded: 23.52 Nutritional Status: BMI of 19-24  Normal Nutritional Risks: None Diabetes: No  How often do you need to have someone help you when you read instructions, pamphlets, or other written materials from your doctor or pharmacy?: 1 - Never What is the last grade level you completed in school?: High School Graduate  Diabetic? no  Interpreter Needed?: No  Information entered by :: Susie Cassette, LPN   Activities of Daily Living In your present state of health, do you have any difficulty performing the following activities: 08/08/2020  Hearing? N  Vision? N  Difficulty  concentrating or making decisions? N  Walking or climbing stairs? N  Dressing or bathing? N  Doing errands, shopping? N  Preparing Food and eating ? N  Using the Toilet? N  In the past six months, have you accidently leaked urine? Y  Do you have problems with loss of bowel control? N  Managing your Medications? N  Managing your Finances? N  Housekeeping or managing your Housekeeping? N  Some recent data might be hidden    Patient Care Team: Pincus Sanes, MD as PCP - General (Internal Medicine) Meryl Dare, MD (Gastroenterology) Richarda Overlie, MD (Obstetrics and Gynecology) Maris Berger, MD (Ophthalmology) Wendall Stade, MD (Cardiology) Beverely Low, MD (Orthopedic Surgery) Sheran Luz, MD (Physical Medicine and Rehabilitation) Kathyrn Sheriff, [facility] as Pharmacist (Pharmacist)  Indicate  any recent Medical Services you may have received from other than [facility] providers in the past year (date may be approximate).     Assessment:   This is a routine wellness examination for [facility].  Hearing/Vision screen No exam data present  Dietary issues and exercise activities discussed: Current Exercise Habits: The patient does not participate in regular exercise at present, Exercise limited by: None identified  Goals    . Pharmacy Care Plan     CARE PLAN ENTRY (see longitudinal plan of care for additional care plan information)  Current Barriers:  . Chronic Disease Management support, education, and care coordination needs related to Hypertension, Hyperlipidemia, and GERD   Hypertension BP Readings from Last 3 Encounters:  11/18/19 140/78  11/07/19 (!) 156/84  08/02/19 (!) 166/84 .  Pharmacist Clinical Goal(s): o Over the next 180 days, patient will work with PharmD and providers to maintain BP goal <140/90 . Current regimen:  o bisoprolol/HCTZ 5/6.25 mg daily . Interventions: o Discussed white coat hypertension o Discussed BP goals and benefits of  medications for prevention of heart attack / stroke . Patient self care activities - Over the next 180 days, patient will: o Check BP daily, document, and provide at future appointments o Ensure daily salt intake < 2300 mg/day  Hyperlipidemia Lab Results  Component Value Date/Time   LDLCALC 119 (H) 01/11/2019 10:19 AM   LDLDIRECT 158.0 06/27/2015 11:24 AM .  Pharmacist Clinical Goal(s): o Over the next 180 days, patient will work with PharmD and providers to achieve LDL goal < 100 . Current regimen:  o Pravastatin 20 mg daily . Interventions: o Discussed cholesterol goals and benefits of medication for prevention of heart attack / stroke o Trial off of pravastatin led to no improvement in leg pain; discussed pravastatin is not the cause and patient should resume medication . Patient self care activities - Over the next 180 days, patient will: o Continue current medications o Continue low cholesterol diet  Heartburn / GERD . Pharmacist Clinical Goal(s) o Over the next 180 days, patient will work with PharmD and providers to optimize therapy . Current regimen:  o famotidine 20 mg daily,  o omeprazole 20 mg daily,  . Interventions: o Discussed optimal timing for famotidine (Pepcid) and omeprazole . Patient self care activities - Over the next 180 days, patient will: o Take Famotidine 15-30 min before a meal for optimal benefit  Medication management . Pharmacist Clinical Goal(s): o Over the next 180 days, patient will work with PharmD and providers to maintain optimal medication adherence . Current pharmacy: [facility] . Interventions o Comprehensive medication review performed. o Continue current medication management strategy . Patient self care activities - Over the next 180 days, patient will: o Focus on medication adherence by fill date o Take medications as prescribed o Report any questions or concerns to PharmD and/or provider(s)  Please see past updates related to this goal  by clicking on the "Past Updates" button in the selected goal       Depression Screen PHQ 2/9 Scores 08/08/2020 03/12/2020 01/11/2019 12/08/2018 12/01/2017 11/26/2016 04/30/2016  PHQ - 2 Score 0 0 0 1 1 0 0  PHQ- 9 Score - - 0 1 1 - -    Fall Risk Fall Risk  08/08/2020 08/02/2019 01/11/2019 12/08/2018 12/01/2017  Falls in the past year? 0 0 0 0 No  Number falls in past yr: 0 0 0 0 -  Injury with Fall? 0 - - - -  Risk for  fall due to : No Fall Risks - - - -  Risk for fall due to: Comment - - - - -  Follow up Falls evaluation completed - - - -    FALL RISK PREVENTION PERTAINING TO THE HOME:  Any stairs in or around the home? Yes  If so, are there any without handrails? No  Home free of loose throw rugs in walkways, pet beds, electrical cords, etc? Yes  Adequate lighting in your home to reduce risk of falls? Yes   ASSISTIVE DEVICES UTILIZED TO PREVENT FALLS:  Life alert? No  Use of a cane, walker or w/c? No  Grab bars in the bathroom? Yes  Shower chair or bench in shower? Yes  Elevated toilet seat or a handicapped toilet? Yes   TIMED UP AND GO:  Was the test performed? No .  Length of time to ambulate 10 feet: 0 sec.   Gait steady and fast without use of assistive device  Cognitive Function: MMSE - Mini Mental State Exam 12/01/2017 11/26/2016  Orientation to time 5 5  Orientation to Place 5 5  Registration 3 3  Attention/ Calculation 4 5  Recall 3 2  Language- name 2 objects 2 2  Language- repeat 1 1  Language- follow 3 step command 3 3  Language- read & follow direction 1 1  Write a sentence 1 1  Copy design 1 1  Total score 29 29        Immunizations Immunization History  Administered Date(s) Administered  . Fluad Quad(high Dose 65+) 05/12/2019, 06/11/2020  . Influenza Split 05/28/2011, 06/23/2012  . Influenza Whole 05/25/2007, 05/31/2010  . Influenza, High Dose Seasonal PF 06/27/2015, 04/30/2016, 05/01/2017, 05/04/2018  . Influenza,inj,Quad PF,6+ Mos 04/20/2013,  06/13/2014  . Moderna SARS-COV2 Booster Vaccination 07/05/2020  . Moderna Sars-Covid-2 Vaccination 08/08/2019, 09/05/2019  . Pneumococcal Conjugate-13 10/31/2014  . Pneumococcal Polysaccharide-23 10/07/2013  . Td 10/07/2013  . Zoster 10/31/2014    TDAP status: Up to date  Flu Vaccine status: Up to date  Pneumococcal vaccine status: Up to date  Covid-19 vaccine status: Completed vaccines  Qualifies for Shingles Vaccine? Yes   Zostavax completed Yes   Shingrix Completed?: No.    Education has been provided regarding the importance of this vaccine. Patient has been advised to call insurance company to determine out of pocket expense if they have not yet received this vaccine. Advised may also receive vaccine at local pharmacy or Health Dept. Verbalized acceptance and understanding.  Screening Tests Health Maintenance  Topic Date Due  . COLONOSCOPY (Pts 45-68yrs Insurance coverage will need to be confirmed)  06/24/2021  . DEXA SCAN  03/22/2022  . TETANUS/TDAP  10/08/2023  . INFLUENZA VACCINE  Completed  . COVID-19 Vaccine  Completed  . PNA vac Low Risk Adult  Completed    Health Maintenance  There are no preventive care reminders to display for this patient.  Colorectal cancer screening: No longer required.   Mammogram status: Completed 01/24/2020. Repeat every year  Bone Density status: Completed 03/22/2020. Results reflect: Bone density results: OSTEOPOROSIS. Repeat every 2 years.  Lung Cancer Screening: (Low Dose CT Chest recommended if Age 56-80 years, 30 pack-year currently smoking OR have quit w/in 15years.) does not qualify.   Lung Cancer Screening Referral: no  Additional Screening:  Hepatitis C Screening: does not qualify; Completed no  Vision Screening: Recommended annual ophthalmology exams for early detection of glaucoma and other disorders of the eye. Is the patient up to date with their  annual eye exam?  Yes  Who is the provider or what is the name of the  office in which the patient attends annual eye exams? Maris Berger, MD. If pt is not established with a provider, would they like to be referred to a provider to establish care? No .   Dental Screening: Recommended annual dental exams for proper oral hygiene  Community Resource Referral / Chronic Care Management: CRR required this visit?  No   CCM required this visit?  No      Plan:     I have personally reviewed and noted the following in the patient's chart:   . Medical and social history . Use of alcohol, tobacco or illicit drugs  . Current medications and supplements . Functional ability and status . Nutritional status . Physical activity . Advanced directives . List of other physicians . Hospitalizations, surgeries, and ER visits in previous 12 months . Vitals . Screenings to include cognitive, depression, and falls . Referrals and appointments  In addition, I have reviewed and discussed with patient certain preventive protocols, quality metrics, and best practice recommendations. A written personalized care plan for preventive services as well as general preventive health recommendations were provided to patient.     Mickeal Needy, LPN   02/22/2059   Nurse Notes:  Normal cognitive status assessed by direct observation by this Nurse Health Advisor. No abnormalities found.

## 2020-08-08 NOTE — Patient Instructions (Addendum)
[name] [name] , Thank you for taking time to come for your Medicare Wellness Visit. I appreciate your ongoing commitment to your health goals. Please review the following plan we discussed and let me know if I can assist you in the future.   Screening recommendations/referrals: Colonoscopy: last done 06/24/2016; no repeat due to age Mammogram: 01/24/2020; due every year Bone Density: 03/22/2020; due every 2 years Recommended yearly ophthalmology/optometry visit for glaucoma screening and checkup Recommended yearly dental visit for hygiene and checkup  Vaccinations: Influenza vaccine: 06/11/2020 Pneumococcal vaccine: 10/07/2013, 10/31/2014 (up to date) Tdap vaccine: 10/07/2013; due every 10 years Shingles vaccine: never done; will check at next visit with Dr. Quay Burow Zoster vaccine: 10/31/2014 Covid-19: 08/08/2019, 09/05/2019, 07/05/2020  Advanced directives: Please bring a copy of your health care power of attorney and living will to the office at your convenience.  Conditions/risks identified: Yes; Reviewed health maintenance screenings with patient today and relevant education, vaccines, and/or referrals were provided. Please continue to do your personal lifestyle choices by: daily care of teeth and gums, regular physical activity (goal should be 5 days a week for 30 minutes), eat a healthy diet, avoid tobacco and drug use, limiting any alcohol intake, taking a low-dose aspirin (if not allergic or have been advised by your provider otherwise) and taking vitamins and minerals as recommended by your provider. Continue doing brain stimulating activities (puzzles, reading, adult coloring books, staying active) to keep memory sharp. Continue to eat heart healthy diet (full of fruits, vegetables, whole grains, lean protein, water--limit salt, fat, and sugar intake) and increase physical activity as tolerated.  Next appointment: Please schedule your next Medicare Wellness Visit with your Nurse Health Advisor in 1  year by calling [phone].   Preventive Care 11 Years and Older, Female Preventive care refers to lifestyle choices and visits with your health care provider that can promote health and wellness. What does preventive care include?  A yearly physical exam. This is also called an annual well check.  Dental exams once or twice a year.  Routine eye exams. Ask your health care provider how often you should have your eyes checked.  Personal lifestyle choices, including:  Daily care of your teeth and gums.  Regular physical activity.  Eating a healthy diet.  Avoiding tobacco and drug use.  Limiting alcohol use.  Practicing safe sex.  Taking low-dose aspirin every day.  Taking vitamin and mineral supplements as recommended by your health care provider. What happens during an annual well check? The services and screenings done by your health care provider during your annual well check will depend on your age, overall health, lifestyle risk factors, and family history of disease. Counseling  Your health care provider may ask you questions about your:  Alcohol use.  Tobacco use.  Drug use.  Emotional well-being.  Home and relationship well-being.  Sexual activity.  Eating habits.  History of falls.  Memory and ability to understand (cognition).  Work and work Statistician.  Reproductive health. Screening  You may have the following tests or measurements:  Height, weight, and BMI.  Blood pressure.  Lipid and cholesterol levels. These may be checked every 5 years, or more frequently if you are over 23 years old.  Skin check.  Lung cancer screening. You may have this screening every year starting at age 19 if you have a 30-pack-year history of smoking and currently smoke or have quit within the past 15 years.  Fecal occult blood test (FOBT) of the stool. You may  have this test every year starting at age 42.  Flexible sigmoidoscopy or colonoscopy. You may  have a sigmoidoscopy every 5 years or a colonoscopy every 10 years starting at age 54.  Hepatitis C blood test.  Hepatitis B blood test.  Sexually transmitted disease (STD) testing.  Diabetes screening. This is done by checking your blood sugar (glucose) after you have not eaten for a while (fasting). You may have this done every 1-3 years.  Bone density scan. This is done to screen for osteoporosis. You may have this done starting at age 47.  Mammogram. This may be done every 1-2 years. Talk to your health care provider about how often you should have regular mammograms. Talk with your health care provider about your test results, treatment options, and if necessary, the need for more tests. Vaccines  Your health care provider may recommend certain vaccines, such as:  Influenza vaccine. This is recommended every year.  Tetanus, diphtheria, and acellular pertussis (Tdap, Td) vaccine. You may need a Td booster every 10 years.  Zoster vaccine. You may need this after age 38.  Pneumococcal 13-valent conjugate (PCV13) vaccine. One dose is recommended after age 61.  Pneumococcal polysaccharide (PPSV23) vaccine. One dose is recommended after age 53. Talk to your health care provider about which screenings and vaccines you need and how often you need them. This information is not intended to replace advice given to you by your health care provider. Make sure you discuss any questions you have with your health care provider. Document Released: 08/10/2015 Document Revised: 04/02/2016 Document Reviewed: 05/15/2015 Elsevier Interactive Patient Education  2017 [employer] Prevention in the Home Falls can cause injuries. They can happen to people of all ages. There are many things you can do to make your home safe and to help prevent falls. What can I do on the outside of my home?  Regularly fix the edges of walkways and driveways and fix any cracks.  Remove anything that might make  you trip as you walk through a door, such as a raised step or threshold.  Trim any bushes or trees on the path to your home.  Use bright outdoor lighting.  Clear any walking paths of anything that might make someone trip, such as rocks or tools.  Regularly check to see if handrails are loose or broken. Make sure that both sides of any steps have handrails.  Any raised decks and porches should have guardrails on the edges.  Have any leaves, snow, or ice cleared regularly.  Use sand or salt on walking paths during winter.  Clean up any spills in your garage right away. This includes oil or grease spills. What can I do in the bathroom?  Use night lights.  Install grab bars by the toilet and in the tub and shower. Do not use towel bars as grab bars.  Use non-skid mats or decals in the tub or shower.  If you need to sit down in the shower, use a plastic, non-slip stool.  Keep the floor dry. Clean up any water that spills on the floor as soon as it happens.  Remove soap buildup in the tub or shower regularly.  Attach bath mats securely with double-sided non-slip rug tape.  Do not have throw rugs and other things on the floor that can make you trip. What can I do in the bedroom?  Use night lights.  Make sure that you have a light by your bed that is easy  to reach.  Do not use any sheets or blankets that are too big for your bed. They should not hang down onto the floor.  Have a firm chair that has side arms. You can use this for support while you get dressed.  Do not have throw rugs and other things on the floor that can make you trip. What can I do in the kitchen?  Clean up any spills right away.  Avoid walking on wet floors.  Keep items that you use a lot in easy-to-reach places.  If you need to reach something above you, use a strong step stool that has a grab bar.  Keep electrical cords out of the way.  Do not use floor polish or wax that makes floors slippery.  If you must use wax, use non-skid floor wax.  Do not have throw rugs and other things on the floor that can make you trip. What can I do with my stairs?  Do not leave any items on the stairs.  Make sure that there are handrails on both sides of the stairs and use them. Fix handrails that are broken or loose. Make sure that handrails are as long as the stairways.  Check any carpeting to make sure that it is firmly attached to the stairs. Fix any carpet that is loose or worn.  Avoid having throw rugs at the top or bottom of the stairs. If you do have throw rugs, attach them to the floor with carpet tape.  Make sure that you have a light switch at the top of the stairs and the bottom of the stairs. If you do not have them, ask someone to add them for you. What else can I do to help prevent falls?  Wear shoes that:  Do not have high heels.  Have rubber bottoms.  Are comfortable and fit you well.  Are closed at the toe. Do not wear sandals.  If you use a stepladder:  Make sure that it is fully opened. Do not climb a closed stepladder.  Make sure that both sides of the stepladder are locked into place.  Ask someone to hold it for you, if possible.  Clearly mark and make sure that you can see:  Any grab bars or handrails.  First and last steps.  Where the edge of each step is.  Use tools that help you move around (mobility aids) if they are needed. These include:  Canes.  Walkers.  Scooters.  Crutches.  Turn on the lights when you go into a dark area. Replace any light bulbs as soon as they burn out.  Set up your furniture so you have a clear path. Avoid moving your furniture around.  If any of your floors are uneven, fix them.  If there are any pets around you, be aware of where they are.  Review your medicines with your doctor. Some medicines can make you feel dizzy. This can increase your chance of falling. Ask your doctor what other things that you can do to  help prevent falls. This information is not intended to replace advice given to you by your health care provider. Make sure you discuss any questions you have with your health care provider. Document Released: 05/10/2009 Document Revised: 12/20/2015 Document Reviewed: 08/18/2014 Elsevier Interactive Patient Education  2017 [employer].

## 2020-09-16 NOTE — Patient Instructions (Signed)
    Blood work was ordered.      Medications changes include :   none     Please followup in 6 months  

## 2020-09-16 NOTE — Progress Notes (Signed)
Subjective:    Patient ID: [name] [name], female    DOB: [birth year], 80 y.o.   MRN: [id number]  HPI The patient is here for follow up of their chronic medical problems, including htn, hyperlipidemia, GERD, hypothyroidism, OP on prolia, prediabetes   prolia upcoming next month.  She is active at the house/yard.   She does not exercise.   Pain in side of head, may come down to ear.  She had that a couple of weeks ago on left side.  The pain comes and goes.  It may come back a little later.  Lasts a couple of seconds.    Medications and allergies reviewed with patient and updated if appropriate.  Patient Active Problem List   Diagnosis Date Noted  . Lower back pain 11/07/2019  . Closed compression fracture of lumbar vertebra ([place]), T11, T12, L1 12/30/2017  . Lung nodules, stable, no f/u needed 12/30/2017  . Hypothyroidism 11/01/2016  . Constipation 04/30/2016  . Prediabetes 11/01/2015  . Spinal stenosis of lumbar region 10/03/2015  . DDD (degenerative disc disease), lumbar   . DJD (degenerative joint disease) of knee   . Hypertension 11/25/2010  . History of colon polyps 05/14/2009  . G E R D 08/17/2007  . Hyperlipidemia 08/16/2007  . HIATAL HERNIA 08/16/2007  . DIVERTICULAR DISEASE 08/16/2007  . IBS 08/16/2007  . Osteoporosis 08/16/2007    Current Outpatient Medications on File Prior to Visit  Medication Sig Dispense Refill  . Biotin 5 MG CAPS Taking daily  0  . bisoprolol-hydrochlorothiazide (ZIAC) 5-6.25 MG tablet TAKE 1 TABLET BY MOUTH EVERY DAY 90 tablet 1  . calcium-vitamin D (OSCAL WITH D) 500-200 MG-UNIT per tablet Take 1 tablet by mouth 2 (two) times daily.    . clobetasol ointment (TEMOVATE) 0.73 % Apply 1 application topically 2 (two) times daily.    . famotidine (PEPCID) 20 MG tablet Take 20 mg by mouth daily.    [name] ibuprofen (ADVIL,MOTRIN) 200 MG tablet Take 600 mg by mouth 3 (three) times daily. With meals    . levothyroxine (SYNTHROID) 25 MCG tablet TAKE 1  TABLET BY MOUTH EVERY DAY BEFORE BREAKFAST 90 tablet 1  . Multiple Vitamins-Minerals (CENTRUM SILVER PO) Take by mouth daily.    [name] omeprazole (PRILOSEC) 20 MG capsule Take 1 capsule (20 mg total) by mouth daily. 90 capsule 3  . polyethylene glycol (MIRALAX / GLYCOLAX) packet Take 17 g by mouth daily. 2 capfuls daily    . pravastatin (PRAVACHOL) 20 MG tablet Take 1 tablet (20 mg total) by mouth daily. 90 tablet 1  . Wheat Dextrin (BENEFIBER) POWD Twice daily  0  . denosumab (PROLIA) 60 MG/ML SOLN injection Inject 60 mg into the skin every 6 (six) months. Administer in upper arm, thigh, or abdomen (Patient not taking: Reported on 09/17/2020)     No current facility-administered medications on file prior to visit.    Past Medical History:  Diagnosis Date  . Benign neoplasm of colon   . Cataract    bilateral,had surgery  . DDD (degenerative disc disease), lumbar    MRI w. [facility] 05/2015, s/p ESI x 1 w/ improvement  . Diaphragmatic hernia without mention of obstruction or gangrene   . Diverticulosis of colon (without mention of hemorrhage)   . DJD (degenerative joint disease) of knee    L>R, IA cortisone by [facility] in L 03/2015  . Esophageal reflux   . Gallstones   . History of chicken pox   .  Hyperlipidemia   . Hypertension   . Irritable bowel syndrome   . Migraines   . Obesity, unspecified   . Osteoporosis, unspecified   . Tachycardia   . Unspecified hemorrhoids with other complication     Past Surgical History:  Procedure Laterality Date  . APPENDECTOMY  1979  . CHOLECYSTECTOMY  1989  . COLONOSCOPY    . POLYPECTOMY    . VAGINAL HYSTERECTOMY  1996    Social History   Socioeconomic History  . Marital status: Married    Spouse name: Not on file  . Number of children: 2  . Years of education: Not on file  . Highest education level: Not on file  Occupational History  . Occupation: retired  Tobacco Use  . Smoking status: Never Smoker  . Smokeless tobacco: Never Used   Vaping Use  . Vaping Use: Never used  Substance and Sexual Activity  . Alcohol use: No    Alcohol/week: 0.0 standard drinks  . Drug use: No  . Sexual activity: Not Currently  Other Topics Concern  . Not on file  Social History Narrative   Exercise: no regular exercise   Social Determinants of Health   Financial Resource Strain: Low Risk   . Difficulty of Paying Living Expenses: Not hard at all  Food Insecurity: No Food Insecurity  . Worried About Charity fundraiser in the Last Year: Never true  . Ran Out of Food in the Last Year: Never true  Transportation Needs: No Transportation Needs  . Lack of Transportation (Medical): No  . Lack of Transportation (Non-Medical): No  Physical Activity: Inactive  . Days of Exercise per Week: 0 days  . Minutes of Exercise per Session: 0 min  Stress: No Stress Concern Present  . Feeling of Stress : Not at all  Social Connections: Not on file    Family History  Problem Relation Age of Onset  . Heart disease Sister        x 2  . Heart disease Brother   . Stroke Mother   . Colon cancer Sister   . Stroke Sister 68    Review of Systems  Constitutional: Negative for chills and fever.  Respiratory: Negative for cough, shortness of breath and wheezing.   Cardiovascular: Negative for chest pain, palpitations and leg swelling.  Neurological: Negative for light-headedness and headaches.       Objective:   Vitals:   09/17/20 1056  BP: 140/72  Pulse: (!) 58  Temp: 98.4 F (36.9 C)  SpO2: 98%   BP Readings from Last 3 Encounters:  09/17/20 140/72  08/08/20 140/80  03/12/20 (!) 148/78   Wt Readings from Last 3 Encounters:  09/17/20 133 lb (60.3 kg)  08/08/20 132 lb 12.8 oz (60.2 kg)  03/12/20 135 lb (61.2 kg)   Body mass index is 23.56 kg/m.   Physical Exam    Constitutional: Appears well-developed and well-nourished. No distress.  HENT:  Head: Normocephalic and atraumatic.  Neck: Neck supple. No tracheal deviation  present. No thyromegaly present.  No cervical lymphadenopathy Cardiovascular: Normal rate, regular rhythm and normal heart sounds.   No murmur heard. No carotid bruit .  No edema Pulmonary/Chest: Effort normal and breath sounds normal. No respiratory distress. No has no wheezes. No rales.  Skin: Skin is warm and dry. Not diaphoretic.  Psychiatric: Normal mood and affect. Behavior is normal.      Assessment & Plan:    See Problem List for Assessment and Plan of  chronic medical problems.    This visit occurred during the SARS-CoV-2 public health emergency.  Safety protocols were in place, including screening questions prior to the visit, additional usage of staff PPE, and extensive cleaning of exam room while observing appropriate contact time as indicated for disinfecting solutions.

## 2020-09-17 ENCOUNTER — Ambulatory Visit (INDEPENDENT_AMBULATORY_CARE_PROVIDER_SITE_OTHER): Payer: PPO | Admitting: Internal Medicine

## 2020-09-17 ENCOUNTER — Encounter: Payer: Self-pay | Admitting: Internal Medicine

## 2020-09-17 ENCOUNTER — Other Ambulatory Visit: Payer: Self-pay

## 2020-09-17 VITALS — BP 140/72 | HR 58 | Temp 98.4°F | Ht 63.0 in | Wt 133.0 lb

## 2020-09-17 DIAGNOSIS — E038 Other specified hypothyroidism: Secondary | ICD-10-CM

## 2020-09-17 DIAGNOSIS — M810 Age-related osteoporosis without current pathological fracture: Secondary | ICD-10-CM | POA: Diagnosis not present

## 2020-09-17 DIAGNOSIS — R7303 Prediabetes: Secondary | ICD-10-CM | POA: Diagnosis not present

## 2020-09-17 DIAGNOSIS — E063 Autoimmune thyroiditis: Secondary | ICD-10-CM

## 2020-09-17 DIAGNOSIS — I10 Essential (primary) hypertension: Secondary | ICD-10-CM | POA: Diagnosis not present

## 2020-09-17 DIAGNOSIS — K219 Gastro-esophageal reflux disease without esophagitis: Secondary | ICD-10-CM

## 2020-09-17 DIAGNOSIS — I700 Atherosclerosis of aorta: Secondary | ICD-10-CM

## 2020-09-17 DIAGNOSIS — E7849 Other hyperlipidemia: Secondary | ICD-10-CM

## 2020-09-17 LAB — TSH: TSH: 2.77 u[IU]/mL (ref 0.35–4.50)

## 2020-09-17 LAB — COMPREHENSIVE METABOLIC PANEL
ALT: 12 U/L (ref 0–35)
AST: 17 U/L (ref 0–37)
Albumin: 4.4 g/dL (ref 3.5–5.2)
Alkaline Phosphatase: 37 U/L — ABNORMAL LOW (ref 39–117)
BUN: 16 mg/dL (ref 6–23)
CO2: 31 mEq/L (ref 19–32)
Calcium: 10 mg/dL (ref 8.4–10.5)
Chloride: 100 mEq/L (ref 96–112)
Creatinine, Ser: 0.64 mg/dL (ref 0.40–1.20)
GFR: 82.85 mL/min (ref >60.00)
Glucose, Bld: 98 mg/dL (ref 70–99)
Potassium: 4 mEq/L (ref 3.5–5.1)
Sodium: 139 mEq/L (ref 135–145)
Total Bilirubin: 1.2 mg/dL (ref 0.2–1.2)
Total Protein: 7.1 g/dL (ref 6.0–8.3)

## 2020-09-17 LAB — LIPID PANEL
Cholesterol: 207 mg/dL — ABNORMAL HIGH (ref 0–200)
HDL: 55.7 mg/dL (ref >39.00)
LDL Cholesterol: 116 mg/dL — ABNORMAL HIGH (ref 0–99)
NonHDL: 150.8
Total CHOL/HDL Ratio: 4
Triglycerides: 176 mg/dL — ABNORMAL HIGH (ref 0.0–149.0)
VLDL: 35.2 mg/dL (ref 0.0–40.0)

## 2020-09-17 LAB — HEMOGLOBIN A1C: Hgb A1c MFr Bld: 5.7 % (ref 4.6–6.5)

## 2020-09-17 LAB — VITAMIN D 25 HYDROXY (VIT D DEFICIENCY, FRACTURES): VITD: 47.93 ng/mL (ref 30.00–100.00)

## 2020-09-17 NOTE — Assessment & Plan Note (Signed)
Chronic GERD controlled Continue prilosec 20 mg daily

## 2020-09-17 NOTE — Assessment & Plan Note (Signed)
Chronic  Clinically euthyroid Currently taking levothyroxine 25 mcg Check tsh  Titrate med dose if needed

## 2020-09-17 NOTE — Assessment & Plan Note (Signed)
Chronic BP controlled for her age Continue ziac 5-6.25 mg daily cmp

## 2020-09-17 NOTE — Assessment & Plan Note (Signed)
Chronic Continue pravastatin 20 mg daily LDL not at goal - will check lipids and consider increasing to 40 mg daily Healthy diet and regular activity encouraged

## 2020-09-17 NOTE — Assessment & Plan Note (Signed)
Chronic Check lipid panel  Continue pravastatin 20 mg daily-May need to increase this to 40 mg to reach goal of LDL near 70 given aortic atherosclerosis Regular exercise and healthy diet encouraged

## 2020-09-17 NOTE — Assessment & Plan Note (Signed)
Chronic Check a1c Low sugar / carb diet Stressed regular exercise  

## 2020-09-17 NOTE — Assessment & Plan Note (Signed)
Chronic DEXA up-to-date On Prolia every 6 months-next dose due March 2022 Active, but not exercising regularly-encouraged as much exercise as possible Continue calcium and vitamin D daily Check vitamin D level

## 2020-09-19 ENCOUNTER — Telehealth: Payer: Self-pay | Admitting: Internal Medicine

## 2020-09-19 NOTE — Telephone Encounter (Signed)
Patient returned call for lab results. She can be reached at [phone]

## 2020-09-20 NOTE — Telephone Encounter (Signed)
Results given today 

## 2020-09-25 ENCOUNTER — Telehealth: Payer: Self-pay | Admitting: Internal Medicine

## 2020-09-25 NOTE — Telephone Encounter (Signed)
  Will from [place] Phone [phone]  Will requesting renewal for Prolia

## 2020-09-27 ENCOUNTER — Other Ambulatory Visit: Payer: Self-pay | Admitting: Internal Medicine

## 2020-09-30 ENCOUNTER — Other Ambulatory Visit: Payer: Self-pay | Admitting: Internal Medicine

## 2020-10-14 ENCOUNTER — Other Ambulatory Visit: Payer: Self-pay | Admitting: Internal Medicine

## 2020-10-16 NOTE — Telephone Encounter (Signed)
Patient requesting call to discuss Prolia benefit

## 2020-10-24 ENCOUNTER — Ambulatory Visit: Payer: PPO

## 2020-10-24 NOTE — Telephone Encounter (Signed)
[facility] called to say that Prolia should be deliver by tomorrow. Please advise.

## 2020-10-25 NOTE — Telephone Encounter (Signed)
LVM for patient that her Prolia had arrived from [facility].  She can reschedule her appointment at any time.

## 2020-10-29 ENCOUNTER — Ambulatory Visit (INDEPENDENT_AMBULATORY_CARE_PROVIDER_SITE_OTHER): Payer: PPO

## 2020-10-29 ENCOUNTER — Other Ambulatory Visit: Payer: Self-pay

## 2020-10-29 DIAGNOSIS — M810 Age-related osteoporosis without current pathological fracture: Secondary | ICD-10-CM

## 2020-10-29 MED ORDER — DENOSUMAB 60 MG/ML ~~LOC~~ SOSY
60.0000 mg | PREFILLED_SYRINGE | Freq: Once | SUBCUTANEOUS | Status: AC
  Administered 2020-10-29: 60 mg via SUBCUTANEOUS

## 2020-10-29 NOTE — Progress Notes (Signed)
Pt here for Prolia injection per Dr Quay Burow.  Prolia 60mg  given subcutaneous right arm and pt tolerated injection well.  Next Prolia injection scheduled for 05/01/21.

## 2020-11-20 ENCOUNTER — Telehealth: Payer: Self-pay | Admitting: Pharmacist

## 2020-11-20 NOTE — Chronic Care Management (AMB) (Signed)
Chronic Care Management Pharmacy Assistant   Name: [name] [name]  MRN: [id number] DOB: Dec 10, 1940   Reason for Encounter: Hypertension Disease State Call   Conditions to be addressed/monitored: HTN   Recent office visits:  09/17/20 Dr. Quay Burow  Recent consult visits:  None ID  Hospital visits:  None in previous 6 months  Medications: Outpatient Encounter Medications as of 11/20/2020  Medication Sig  . Biotin 5 MG CAPS Taking daily  . bisoprolol-hydrochlorothiazide (ZIAC) 5-6.25 MG tablet TAKE 1 TABLET BY MOUTH EVERY DAY  . calcium-vitamin D (OSCAL WITH D) 500-200 MG-UNIT per tablet Take 1 tablet by mouth 2 (two) times daily.  . clobetasol ointment (TEMOVATE) 4.08 % Apply 1 application topically 2 (two) times daily.  [name] denosumab (PROLIA) 60 MG/ML SOLN injection Inject 60 mg into the skin every 6 (six) months. Administer in upper arm, thigh, or abdomen (Patient not taking: Reported on 09/17/2020)  . famotidine (PEPCID) 20 MG tablet Take 20 mg by mouth daily.  [name] ibuprofen (ADVIL,MOTRIN) 200 MG tablet Take 600 mg by mouth 3 (three) times daily. With meals  . levothyroxine (SYNTHROID) 25 MCG tablet TAKE 1 TABLET BY MOUTH EVERY DAY BEFORE BREAKFAST  . Multiple Vitamins-Minerals (CENTRUM SILVER PO) Take by mouth daily.  [name] omeprazole (PRILOSEC) 20 MG capsule TAKE 1 CAPSULE BY MOUTH EVERY DAY  . polyethylene glycol (MIRALAX / GLYCOLAX) packet Take 17 g by mouth daily. 2 capfuls daily  . pravastatin (PRAVACHOL) 20 MG tablet TAKE 1 TABLET BY MOUTH EVERY DAY  . PROLIA 60 MG/ML SOSY injection INJECT 60 MG INTO THE SKIN ONCE FOR 1 DOSE.  [name] Wheat Dextrin (BENEFIBER) POWD Twice daily   No facility-administered encounter medications on file as of 11/20/2020.      Reviewed chart prior to disease state call. Spoke with patient regarding BP  Recent Office Vitals: BP Readings from Last 3 Encounters:  09/17/20 140/72  08/08/20 140/80  03/12/20 (!) 148/78   Pulse Readings from Last 3  Encounters:  09/17/20 (!) 58  08/08/20 60  03/12/20 69    Wt Readings from Last 3 Encounters:  09/17/20 133 lb (60.3 kg)  08/08/20 132 lb 12.8 oz (60.2 kg)  03/12/20 135 lb (61.2 kg)     Kidney Function Lab Results  Component Value Date/Time   CREATININE 0.64 09/17/2020 11:24 AM   CREATININE 0.75 03/12/2020 11:45 AM   CREATININE 0.68 08/02/2019 10:16 AM   GFR 82.85 09/17/2020 11:24 AM   GFRNONAA 75 03/12/2020 11:45 AM   GFRAA 87 03/12/2020 11:45 AM    BMP Latest Ref Rng & Units 09/17/2020 03/12/2020 08/02/2019  Glucose 70 - 99 mg/dL 98 86 93  BUN 6 - 23 mg/dL 16 12 16   Creatinine 0.40 - 1.20 mg/dL 0.64 0.75 0.68  BUN/Creat Ratio 6 - 22 (calc) - NOT APPLICABLE -  Sodium 144 - 145 mEq/L 139 141 140  Potassium 3.5 - 5.1 mEq/L 4.0 4.6 4.4  Chloride 96 - 112 mEq/L 100 101 101  CO2 19 - 32 mEq/L 31 32 33(H)  Calcium 8.4 - 10.5 mg/dL 10.0 10.9(H) 10.2    . Current antihypertensive regimen:  Patient is taking Bisoprolol-hctz 5-6.25 mg daily  . How often are you checking your Blood Pressure? Patient states she has not check blood pressure in a while. Patient stated that she will begin today checking   . Current home BP readings: Patient last reading was 140/72 at Dr. Quay Burow office on 09/17/20  . What recent interventions/DTPs have been  made by any provider to improve Blood Pressure control since last CPP Visit: Continue current blood pressure medication  . Any recent hospitalizations or ED visits since last visit with CPP? None ID  . What diet changes have been made to improve Blood Pressure Control?  Patient states that she has not made any changes to diet  . What exercise is being done to improve your Blood Pressure Control?  Patient states that she has not made any changes to exercise  Adherence Review: Is the patient currently on ACE/ARB medication? No Does the patient have >5 day gap between last estimated fill dates? No   Star Rating Drugs: Pravastatin 10/15/20 90  ds  [place] Pharmacist Assistant [phone]  Time spent:30

## 2020-12-04 DIAGNOSIS — H52203 Unspecified astigmatism, bilateral: Secondary | ICD-10-CM | POA: Diagnosis not present

## 2020-12-04 DIAGNOSIS — Z961 Presence of intraocular lens: Secondary | ICD-10-CM | POA: Diagnosis not present

## 2020-12-06 ENCOUNTER — Other Ambulatory Visit: Payer: Self-pay | Admitting: Internal Medicine

## 2020-12-12 ENCOUNTER — Ambulatory Visit: Payer: PPO | Attending: Internal Medicine

## 2020-12-12 DIAGNOSIS — Z23 Encounter for immunization: Secondary | ICD-10-CM

## 2020-12-12 NOTE — Progress Notes (Signed)
   Covid-19 Vaccination Clinic  Name:  [name] [name]    MRN: [id number] DOB: May 22, 1941  12/12/2020  Ms. [name] was observed post Covid-19 immunization for 15 minutes without incident. She was provided with Vaccine Information Sheet and instruction to access the V-Safe system.   Ms. [name] was instructed to call 911 with any severe reactions post vaccine: [name] Difficulty breathing  . Swelling of face and throat  . A fast heartbeat  . A bad rash all over body  . Dizziness and weakness   Immunizations Administered    Name Date Dose VIS Date Route   Moderna Covid-19 Booster Vaccine 12/12/2020 10:57 AM 0.25 mL 05/16/2020 Intramuscular   Manufacturer: Moderna   Lot: [id number]   [place]: [id number]

## 2020-12-17 ENCOUNTER — Ambulatory Visit (INDEPENDENT_AMBULATORY_CARE_PROVIDER_SITE_OTHER): Payer: PPO | Admitting: Pharmacist

## 2020-12-17 ENCOUNTER — Other Ambulatory Visit: Payer: Self-pay

## 2020-12-17 DIAGNOSIS — I700 Atherosclerosis of aorta: Secondary | ICD-10-CM

## 2020-12-17 DIAGNOSIS — K219 Gastro-esophageal reflux disease without esophagitis: Secondary | ICD-10-CM

## 2020-12-17 DIAGNOSIS — E7849 Other hyperlipidemia: Secondary | ICD-10-CM

## 2020-12-17 DIAGNOSIS — M810 Age-related osteoporosis without current pathological fracture: Secondary | ICD-10-CM

## 2020-12-17 DIAGNOSIS — I10 Essential (primary) hypertension: Secondary | ICD-10-CM

## 2020-12-17 MED ORDER — PRAVASTATIN SODIUM 40 MG PO TABS: 40.0000 mg | ORAL_TABLET | Freq: Every day | ORAL | 0 refills | Status: DC

## 2020-12-17 NOTE — Progress Notes (Signed)
Chronic Care Management Pharmacy Note  12/17/2020 Name:  [name] [name] MRN:  [id number] DOB:  10/31/1940  Subjective: [name] [name] is an 80 y.o. year old female who is a primary patient of Burns, Claudina Lick, MD.  The CCM team was consulted for assistance with disease management and care coordination needs.    Engaged with patient by telephone for follow up visit in response to provider referral for pharmacy case management and/or care coordination services.   Consent to Services:  The patient was given information about Chronic Care Management services, agreed to services, and gave verbal consent prior to initiation of services.  Please see initial visit note for detailed documentation.   Patient Care Team: Binnie Rail, MD as PCP - General (Internal Medicine) Ladene Artist, MD (Gastroenterology) Molli Posey, MD (Obstetrics and Gynecology) Luberta Mutter, MD (Ophthalmology) Josue Hector, MD (Cardiology) Netta Cedars, MD (Orthopedic Surgery) Suella Broad, MD (Physical Medicine and Rehabilitation) Charlton Haws, [facility] as Pharmacist (Pharmacist)  Recent office visits: 09/17/20 Dr Quay Burow OV: chronic f/u; Prolia due 09/2020. LDL above goal, advised to increase pravastatin to 40 mg  Recent consult visits: 07/12/20 Dr Matthew Saras (OB/GYN): gyn exam  Hospital visits: None in previous 6 months  Objective:  Lab Results  Component Value Date   CREATININE 0.64 09/17/2020   BUN 16 09/17/2020   GFR 82.85 09/17/2020   GFRNONAA 75 03/12/2020   GFRAA 87 03/12/2020   NA 139 09/17/2020   K 4.0 09/17/2020   CALCIUM 10.0 09/17/2020   CO2 31 09/17/2020   GLUCOSE 98 09/17/2020    Lab Results  Component Value Date/Time   HGBA1C 5.7 09/17/2020 11:24 AM   HGBA1C 5.5 03/12/2020 11:45 AM   GFR 82.85 09/17/2020 11:24 AM   GFR 83.18 08/02/2019 10:16 AM    Last diabetic Eye exam: No results found for: HMDIABEYEEXA  Last diabetic Foot exam: No results found for: HMDIABFOOTEX    Lab Results  Component Value Date   CHOL 207 (H) 09/17/2020   HDL 55.70 09/17/2020   LDLCALC 116 (H) 09/17/2020   LDLDIRECT 158.0 06/27/2015   TRIG 176.0 (H) 09/17/2020   CHOLHDL 4 09/17/2020    Hepatic Function Latest Ref Rng & Units 09/17/2020 03/12/2020 08/02/2019  Total Protein 6.0 - 8.3 g/dL 7.1 7.3 7.5  Albumin 3.5 - 5.2 g/dL 4.4 - 4.4  AST 0 - 37 U/L _0 ALT 0 - 35 U/L _1 Alk Phosphatase 39 - 117 U/L 37(L) - 39  Total Bilirubin 0.2 - 1.2 mg/dL 1.2 1.2 1.3(H)  Bilirubin, Direct 0.0 - 0.3 mg/dL - - -    Lab Results  Component Value Date/Time   TSH 2.77 09/17/2020 11:24 AM   TSH 1.41 03/12/2020 11:45 AM   FREET4 0.75 04/30/2016 11:51 AM    CBC Latest Ref Rng & Units 03/12/2020 01/11/2019 11/02/2017  WBC 3.8 - 10.8 Thousand/uL 7.0 6.7 6.3  Hemoglobin 11.7 - 15.5 g/dL 14.2 13.8 13.7  Hematocrit 35.0 - 45.0 % 43.4 41.1 40.7  Platelets 140 - 400 Thousand/uL 241 226.0 233.0    Lab Results  Component Value Date/Time   VD25OH 47.93 09/17/2020 11:24 AM   VD25OH 31 03/12/2020 11:45 AM   VD25OH 51.42 01/11/2019 10:19 AM    Clinical ASCVD: Yes  The ASCVD Risk score (Goff DC Jr., et al., 2013) failed to calculate for the following reasons:   The 2013 ASCVD risk score is only valid for ages 73 to 70  Depression screen [facility] 2/9 08/08/2020 03/12/2020 01/11/2019  Decreased Interest 0 0 0  Down, Depressed, Hopeless 0 0 0  PHQ - 2 Score 0 0 0  Altered sleeping - - 0  Tired, decreased energy - - 0  Change in appetite - - 0  Feeling bad or failure about yourself  - - 0  Trouble concentrating - - 0  Moving slowly or fidgety/restless - - 0  Suicidal thoughts - - 0  PHQ-9 Score - - 0  Difficult doing work/chores - - -      Social History   Tobacco Use  Smoking Status Never Smoker  Smokeless Tobacco Never Used   BP Readings from Last 3 Encounters:  09/17/20 140/72  08/08/20 140/80  03/12/20 (!) 148/78   Pulse Readings from Last 3 Encounters:  09/17/20 (!)  58  08/08/20 60  03/12/20 69   Wt Readings from Last 3 Encounters:  09/17/20 133 lb (60.3 kg)  08/08/20 132 lb 12.8 oz (60.2 kg)  03/12/20 135 lb (61.2 kg)   BMI Readings from Last 3 Encounters:  09/17/20 23.56 kg/m  08/08/20 23.52 kg/m  03/12/20 23.91 kg/m    Assessment/Interventions: Review of patient past medical history, allergies, medications, health status, including review of consultants reports, laboratory and other test data, was performed as part of comprehensive evaluation and provision of chronic care management services.   SDOH:  (Social Determinants of Health) assessments and interventions performed: Yes  SDOH Screenings   Alcohol Screen: Low Risk   . Last Alcohol Screening Score (AUDIT): 0  Depression (PHQ2-9): Low Risk   . PHQ-2 Score: 0  Financial Resource Strain: Low Risk   . Difficulty of Paying Living Expenses: Not hard at all  Food Insecurity: No Food Insecurity  . Worried About Charity fundraiser in the Last Year: Never true  . Ran Out of Food in the Last Year: Never true  Housing: Low Risk   . Last Housing Risk Score: 0  Physical Activity: Inactive  . Days of Exercise per Week: 0 days  . Minutes of Exercise per Session: 0 min  Social Connections: Not on file  Stress: No Stress Concern Present  . Feeling of Stress : Not at all  Tobacco Use: Low Risk   . Smoking Tobacco Use: Never Smoker  . Smokeless Tobacco Use: Never Used  Transportation Needs: No Transportation Needs  . Lack of Transportation (Medical): No  . Lack of Transportation (Non-Medical): No    CCM Care Plan  No Known Allergies  Medications Reviewed Today    Reviewed by Charlton Haws, [facility] (Pharmacist) on 12/17/20 at 1338  Med List Status: <None>  Medication Order Taking? Sig Documenting Provider Last Dose Status Informant  Biotin 5 MG CAPS [id number] Yes Taking daily Binnie Rail, MD Taking Active   bisoprolol-hydrochlorothiazide [facility]) 5-6.25 MG tablet [id number] Yes  TAKE 1 TABLET BY MOUTH EVERY DAY Binnie Rail, MD Taking Active   CALCIUM CITRATE-VITAMIN D PO [id number] Yes Take 600 mg by mouth in the morning and at bedtime. [provider] Taking Active   clobetasol ointment (TEMOVATE) 0.05 % [id number] Yes Apply 1 application topically 2 (two) times daily. [provider] Taking Active   denosumab (PROLIA) 60 MG/ML SOLN injection [id number] Yes Inject 60 mg into the skin every 6 (six) months. Administer in upper arm, thigh, or abdomen [provider] Taking Active   famotidine (PEPCID) 20 MG tablet [id number] Yes Take 20 mg by mouth daily. [provider] Taking Active Self  ibuprofen (ADVIL,MOTRIN) 200 MG tablet [id number] Yes Take 600 mg by mouth 3 (three) times daily. With meals [provider] Taking Active   levothyroxine (SYNTHROID) 25 MCG tablet [id number] Yes TAKE 1 TABLET BY MOUTH EVERY DAY BEFORE BREAKFAST Burns, Claudina Lick, MD Taking Active   Multiple Vitamins-Minerals (CENTRUM SILVER PO) [id number] Yes Take by mouth daily. [provider] Taking Active   omeprazole (PRILOSEC) 20 MG capsule [id number] Yes TAKE 1 CAPSULE BY MOUTH EVERY DAY  Patient taking differently: Take 20 mg by mouth every other day.   Binnie Rail, MD Taking Active   polyethylene glycol [facility] / GLYCOLAX) packet [id number] Yes Take 17 g by mouth daily. 2 capfuls daily [provider] Taking Active   PROLIA 60 MG/ML SOSY injection [id number] Yes INJECT 60 MG INTO THE SKIN ONCE FOR 1 DOSE. Binnie Rail, MD Taking Active   Wheat Dextrin [facility]) POWD [id number] Yes Twice daily Binnie Rail, MD Taking Active           Patient Active Problem List   Diagnosis Date Noted  . Aortic atherosclerosis ([place]) 09/17/2020  . Lower back pain 11/07/2019  . Closed compression fracture of lumbar vertebra ([place]), T11, T12, L1 12/30/2017  . Lung nodules, stable, no f/u needed 12/30/2017  . Hypothyroidism 11/01/2016  . Constipation  04/30/2016  . Prediabetes 11/01/2015  . Spinal stenosis of lumbar region 10/03/2015  . DDD (degenerative disc disease), lumbar   . DJD (degenerative joint disease) of knee   . Hypertension 11/25/2010  . History of colon polyps 05/14/2009  . G E R D 08/17/2007  . Hyperlipidemia 08/16/2007  . HIATAL HERNIA 08/16/2007  . DIVERTICULAR DISEASE 08/16/2007  . IBS 08/16/2007  . Osteoporosis 08/16/2007    Immunization History  Administered Date(s) Administered  . Fluad Quad(high Dose 65+) 05/12/2019, 06/11/2020  . Influenza Split 05/28/2011, 06/23/2012  . Influenza Whole 05/25/2007, 05/31/2010  . Influenza, High Dose Seasonal PF 06/27/2015, 04/30/2016, 05/01/2017, 05/04/2018  . Influenza,inj,Quad PF,6+ Mos 04/20/2013, 06/13/2014  . Moderna SARS-COV2 Booster Vaccination 07/05/2020, 12/12/2020  . Moderna Sars-Covid-2 Vaccination 08/08/2019, 09/05/2019  . Pneumococcal Conjugate-13 10/31/2014  . Pneumococcal Polysaccharide-23 10/07/2013  . Td 10/07/2013  . Zoster 10/31/2014    Conditions to be addressed/monitored:  Hypertension, Hyperlipidemia, Coronary Artery Disease, GERD and Osteoporosis  Care Plan : [place]  Updates made by Charlton Haws, [place] since 12/17/2020 12:00 AM    Problem: Hypertension, Hyperlipidemia, GERD and Osteoporosis   Priority: High    Long-Range Goal: Disease management   Start Date: 12/17/2020  Expected End Date: 12/17/2021  This Visit's Progress: On track  Priority: High  Note:   Current Barriers:  . Unable to independently monitor therapeutic efficacy . Suboptimal therapeutic regimen for hyperlipidemia  Pharmacist Clinical Goal(s):  [name] Patient will achieve adherence to monitoring guidelines and medication adherence to achieve therapeutic efficacy . adhere to plan to optimize therapeutic regimen for hyperlipidemia as evidenced by report of adherence to recommended medication management changes through collaboration with PharmD and  provider.   Interventions: . 1:1 collaboration with Binnie Rail, MD regarding development and update of comprehensive plan of care as evidenced by provider attestation and co-signature . Inter-disciplinary care team collaboration (see longitudinal plan of care) . Comprehensive medication review performed; medication list updated in electronic medical record   Hypertension    BP goal < 140/90  Patient checks BP at home infrequently Patient home BP readings are ranging: 129/76, 113/66, 126/73,  125/73, 132/75, 97/54  Patient has failed these meds in the past: n/a Patient is currently controlled on the following meds:   bisoprolol/HCTZ 5/6.25 mg daily    We discussed: BP goals; signs/symptoms of low BP; importance of maintaining adequate hydration; benefits of monitoring BP at home consistently   Plan: Continue current medications;  Continue to monitor BP at home daily   Hyperlipidemia / hx aortic atherosclerosis    LDL goal < 100 Patient has failed these meds in past: simvastatin, ezetimibe Patient is currently uncontrolled on the following medications:   pravastatin 20 mg daily HS   We discussed: PCP advised to increase pravastatin to 40 mg at office visit in Feb 2022, pt never started; discussed cholesterol goal and benefits of higher-dose statin; pt agreed to increase dose   Plan: Increase pravastatin to 40 mg daily HS - rx sent and patient will double up on her 20 mg supply until she runs out  GERD    Patient has failed these meds in past: ranitidine Patient is currently controlled on the following medications:   famotidine 20 mg daily,   omeprazole 20 mg daily,    We discussed:  Patient has been alternating PPI - H2RA every other day in attempt to limit omeprazole exposure; she reports symptoms are well controlled this way   Plan: Continue current medications   Osteoporosis   Last DEXA Scan: 03/22/2020             T-Score femoral neck: -3.0             T-Score  lumbar spine: -2.4  Patient is a candidate for pharmacologic treatment due to T-Score < -2.5 in femoral neck and T-Score < -2.5 in lumbar spine  Patient has failed these meds in past: n/a Patient is currently controlled on the following medications:  . Prolia q6 months (last 10/29/2020) . Calcium citrate-Vitamin D 500-800 BID . Vitamin D 400 IU daiily  We discussed:  Recommend [phone] units of vitamin D daily. Recommend 1200 mg of calcium daily from dietary and supplemental sources. ; advised she is getting enough Vitamin D frm the calcium pill, she may stop the extra Vitamin D pill if desired  Plan: Continue current medications  Patient Goals/Self-Care Activities . Patient will:  - take medications as prescribed focus on medication adherence by pill box check blood pressure daily, document, and provide at future appointments - Increase pravastatin to 40 mg - take 2 of the 20 mg tablets until it runs out, then refill new 40 mg rx      Medication Assistance: None required.  Patient affirms current coverage meets needs.  Patient's [facility] pharmacy [facility]:  [id number] [place] [place] [address]: [phone]: [phone] [facility] [address]Phone: [phone]: [phone] Uses pill box? Yes Pt endorses 100% compliance  We discussed: Current pharmacy is preferred with insurance plan and patient is satisfied with pharmacy services Patient decided to: Continue current medication management strategy  Care Plan and Follow Up Patient Decision:  Patient agrees to Care Plan and Follow-up.  Plan: Telephone follow up appointment with care management team member scheduled for:  6 months  LCharlene Brooke PharmD, [place] CPP Clinical Pharmacist [phone]

## 2020-12-17 NOTE — Patient Instructions (Signed)
Visit Information  Phone number for Pharmacist: [phone]  Goals Addressed            This Visit's Progress   . Manage My Medicine       Timeframe:  Long-Range Goal Priority:  Medium Start Date:      12/17/20                      Expected End Date:  12/17/21                     Follow Up Date 06/26/21   - call for medicine refill 2 or 3 days before it runs out - call if I am sick and can't take my medicine - keep a list of all the medicines I take; vitamins and herbals too - use a pillbox to sort medicine - Increase pravastatin to 40 mg - take 2 of the 20 mg tablets until it runs out, then refill new 40 mg rx   Why is this important?   . These steps will help you keep on track with your medicines.   Notes:       The patient verbalized understanding of instructions, educational materials, and care plan provided today and declined offer to receive copy of patient instructions, educational materials, and care plan.  Telephone follow up appointment with pharmacy team member scheduled for: 6 months  Charlene Brooke, PharmD, [place], CPP Clinical Pharmacist [place] [facility] [phone]

## 2020-12-18 ENCOUNTER — Other Ambulatory Visit (HOSPITAL_COMMUNITY): Payer: Self-pay

## 2020-12-18 MED ORDER — COVID-19 MRNA VACC (MODERNA) 100 MCG/0.5ML IM SUSP
INTRAMUSCULAR | 0 refills | Status: DC
Start: 2020-12-12 — End: 2021-01-24
  Filled 2020-12-18: qty 0.5, 17d supply, fill #0

## 2021-01-09 ENCOUNTER — Telehealth (INDEPENDENT_AMBULATORY_CARE_PROVIDER_SITE_OTHER): Payer: PPO | Admitting: Family Medicine

## 2021-01-09 ENCOUNTER — Encounter: Payer: Self-pay | Admitting: Family Medicine

## 2021-01-09 DIAGNOSIS — J069 Acute upper respiratory infection, unspecified: Secondary | ICD-10-CM | POA: Diagnosis not present

## 2021-01-09 NOTE — Progress Notes (Signed)
Virtual Visit via Telephone Note  I connected with [name] [name] on 01/09/21 at  4:30 PM EDT by telephone and verified that I am speaking with the correct person using two identifiers.   I discussed the limitations, risks, security and privacy concerns of performing an evaluation and management service by telephone and the availability of in person appointments. I also discussed with the patient that there may be a patient responsible charge related to this service. The patient expressed understanding and agreed to proceed.  Location patient: home Location provider: work or home office Participants present for the call: patient, provider Patient did not have a visit in the prior 7 days to address this/these issue(s).   History of Present Illness: Pt is an 80 yo female with pmh sig for aortic atherosclerosis, HTN, GERD, IBS, hypothyroidism, DDD, history of compression fracture lumbar vertebra, HLD, stable lung nodule, pre-DM was is followed by Billey Gosling, MD and seen for acute concern.    Pt developed body aches with RUE more sore than LUE, rhinorrhea, sore throat, loose stools, cough x a few days (Sun or Monday 6/12 or 6/13).  Denies fever, HA, ear pain/pressure, facial pain/pressure, n/v.  Pt denies changes in appetite.  Able to stay hydrated.  Denies sick contacts.  Pt fully vaccinated and boosted against COVID-19 virus.  Tried Tylenol for symptoms.  Pt had [facility]  Pt taking Pravastatin two 20 mg (total of 40 mg daily).  Pt wasn't sure if it was causing her arms to feel so sore so she stopped taking it over the last few days.  Observations/Objective: Patient sounds cheerful and well on the phone. I do not appreciate any SOB. Speech and thought processing are grossly intact. Patient reported vitals:  BP 114/59, HR 64  Assessment and Plan: Viral URI with cough -COVID 19 virus and influenza A and B testing negative on 01/08/21. -Supportive  care including treatment of symptoms with OTC cough/cold medications.  Can continue the Tylenol. -given strict precautions  Follow Up Instructions:  F/u prn   [id number] 5-10 [id number] 11-20 9443 21-30 I did not refer this patient for an OV in the next 24 hours for this/these issue(s).  I discussed the assessment and treatment plan with the patient. The patient was provided an opportunity to ask questions and all were answered. The patient agreed with the plan and demonstrated an understanding of the instructions.   The patient was advised to call back or seek an in-person evaluation if the symptoms worsen or if the condition fails to improve as anticipated.  I provided 12:15 minutes of non-face-to-face time during this encounter.   Billie Ruddy, MD

## 2021-01-17 ENCOUNTER — Other Ambulatory Visit: Payer: Self-pay

## 2021-01-17 ENCOUNTER — Ambulatory Visit (INDEPENDENT_AMBULATORY_CARE_PROVIDER_SITE_OTHER): Payer: PPO | Admitting: Internal Medicine

## 2021-01-17 ENCOUNTER — Ambulatory Visit (INDEPENDENT_AMBULATORY_CARE_PROVIDER_SITE_OTHER): Payer: PPO

## 2021-01-17 ENCOUNTER — Encounter: Payer: Self-pay | Admitting: Internal Medicine

## 2021-01-17 ENCOUNTER — Other Ambulatory Visit: Payer: Self-pay | Admitting: Internal Medicine

## 2021-01-17 VITALS — BP 140/80 | HR 62 | Temp 98.5°F | Ht 63.0 in | Wt 134.0 lb

## 2021-01-17 DIAGNOSIS — E038 Other specified hypothyroidism: Secondary | ICD-10-CM | POA: Diagnosis not present

## 2021-01-17 DIAGNOSIS — R7303 Prediabetes: Secondary | ICD-10-CM | POA: Diagnosis not present

## 2021-01-17 DIAGNOSIS — E079 Disorder of thyroid, unspecified: Secondary | ICD-10-CM | POA: Insufficient documentation

## 2021-01-17 DIAGNOSIS — I10 Essential (primary) hypertension: Secondary | ICD-10-CM | POA: Diagnosis not present

## 2021-01-17 DIAGNOSIS — M791 Myalgia, unspecified site: Secondary | ICD-10-CM | POA: Diagnosis not present

## 2021-01-17 DIAGNOSIS — R32 Unspecified urinary incontinence: Secondary | ICD-10-CM | POA: Insufficient documentation

## 2021-01-17 DIAGNOSIS — E063 Autoimmune thyroiditis: Secondary | ICD-10-CM

## 2021-01-17 DIAGNOSIS — R0989 Other specified symptoms and signs involving the circulatory and respiratory systems: Secondary | ICD-10-CM | POA: Diagnosis not present

## 2021-01-17 DIAGNOSIS — L309 Dermatitis, unspecified: Secondary | ICD-10-CM | POA: Insufficient documentation

## 2021-01-17 LAB — URINALYSIS, ROUTINE W REFLEX MICROSCOPIC
Bilirubin Urine: NEGATIVE
Ketones, ur: NEGATIVE
Leukocytes,Ua: NEGATIVE
Nitrite: NEGATIVE
Specific Gravity, Urine: <=1.005 — AB (ref 1.000–1.030)
Total Protein, Urine: NEGATIVE
Urine Glucose: NEGATIVE
Urobilinogen, UA: 0.2 (ref 0.0–1.0)
pH: 6.5 (ref 5.0–8.0)

## 2021-01-17 LAB — CBC WITH DIFFERENTIAL/PLATELET
Basophils Absolute: 0.1 10*3/uL (ref 0.0–0.1)
Basophils Relative: 0.6 % (ref 0.0–3.0)
Eosinophils Absolute: 0.2 10*3/uL (ref 0.0–0.7)
Eosinophils Relative: 2 % (ref 0.0–5.0)
HCT: 40.3 % (ref 36.0–46.0)
Hemoglobin: 13.7 g/dL (ref 12.0–15.0)
Lymphocytes Relative: 27.3 % (ref 12.0–46.0)
Lymphs Abs: 3 10*3/uL (ref 0.7–4.0)
MCHC: 33.9 g/dL (ref 30.0–36.0)
MCV: 88.1 fl (ref 78.0–100.0)
Monocytes Absolute: 0.9 10*3/uL (ref 0.1–1.0)
Monocytes Relative: 8 % (ref 3.0–12.0)
Neutro Abs: 6.9 10*3/uL (ref 1.4–7.7)
Neutrophils Relative %: 62.1 % (ref 43.0–77.0)
Platelets: 270 10*3/uL (ref 150.0–400.0)
RBC: 4.58 Mil/uL (ref 3.87–5.11)
RDW: 13 % (ref 11.5–15.5)
WBC: 11 10*3/uL — ABNORMAL HIGH (ref 4.0–10.5)

## 2021-01-17 LAB — HEPATIC FUNCTION PANEL
ALT: 11 U/L (ref 0–35)
AST: 14 U/L (ref 0–37)
Albumin: 4.3 g/dL (ref 3.5–5.2)
Alkaline Phosphatase: 43 U/L (ref 39–117)
Bilirubin, Direct: 0.1 mg/dL (ref 0.0–0.3)
Total Bilirubin: 0.8 mg/dL (ref 0.2–1.2)
Total Protein: 7.8 g/dL (ref 6.0–8.3)

## 2021-01-17 LAB — BASIC METABOLIC PANEL
BUN: 16 mg/dL (ref 6–23)
CO2: 33 mEq/L — ABNORMAL HIGH (ref 19–32)
Calcium: 10 mg/dL (ref 8.4–10.5)
Chloride: 100 mEq/L (ref 96–112)
Creatinine, Ser: 0.72 mg/dL (ref 0.40–1.20)
GFR: 78.2 mL/min (ref >60.00)
Glucose, Bld: 91 mg/dL (ref 70–99)
Potassium: 4.2 mEq/L (ref 3.5–5.1)
Sodium: 140 mEq/L (ref 135–145)

## 2021-01-17 LAB — C-REACTIVE PROTEIN: CRP: <1 mg/dL (ref 0.5–20.0)

## 2021-01-17 LAB — CK: Total CK: 44 U/L (ref 7–177)

## 2021-01-17 LAB — SEDIMENTATION RATE: Sed Rate: 21 mm/hr (ref 0–30)

## 2021-01-17 LAB — TSH: TSH: 5.34 u[IU]/mL — ABNORMAL HIGH (ref 0.35–4.50)

## 2021-01-17 MED ORDER — LEVOTHYROXINE SODIUM 50 MCG PO TABS: 50.0000 ug | ORAL_TABLET | Freq: Every day | ORAL | 3 refills | Status: DC

## 2021-01-17 NOTE — Patient Instructions (Signed)
Ok to take the tylenol as you have  Please continue all other medications as before, and refills have been done if requested.  Please have the pharmacy call with any other refills you may need.  Please keep your appointments with your specialists as you may have planned  Please go to the XRAY Department in the first floor for the x-ray testing  Please go to the LAB at the blood drawing area for the tests to be done  You will be contacted by phone if any changes need to be made immediately.  Otherwise, you will receive a letter about your results with an explanation, but please check with MyChart first.  Please remember to sign up for MyChart if you have not done so, as this will be important to you in the future with finding out test results, communicating by private email, and scheduling acute appointments online when needed.  Please see Dr Quay Burow in 1 -2 wks if not improved, or sooner if needed

## 2021-01-17 NOTE — Progress Notes (Signed)
Patient ID: [name] [name], female   DOB: Aug 22, 1940, 80 y.o.   MRN: [id number]        Chief Complaint: follow up aching all over       HPI:  [name] [name] is a 80 y.o. female here with vague c/o aching all over ; symptoms seems to start 2 wks ago with sudden onset URI symptoms for 3 days which seemed to resolve quickly and covid neg x 2 since then then had onset aching all over to muscles it seems with an movement in the past 3-4 days, mild, intermittent, worse to movement but better to not do this, and nothing seems to hurt or any tenderness to touch.  Denies high fever, chills, HA, ST, cough ,and Pt denies chest pain, increased sob or doe, wheezing, orthopnea, PND, increased LE swelling, palpitations, dizziness or syncope.  Denies urinary symptoms such as dysuria, frequency, urgency, flank pain, hematuria or n/v, fever, chills.   No other specific s/s.   Denies hyper or hypo thyroid symptoms such as voice, skin or hair change.  Pt denies polydipsia, polyuria, or new focal neuro s/s.        Wt Readings from Last 3 Encounters:  01/17/21 134 lb (60.8 kg)  09/17/20 133 lb (60.3 kg)  08/08/20 132 lb 12.8 oz (60.2 kg)   BP Readings from Last 3 Encounters:  01/17/21 140/80  09/17/20 140/72  08/08/20 140/80         Past Medical History:  Diagnosis Date   Benign neoplasm of colon    Cataract    bilateral,had surgery   DDD (degenerative disc disease), lumbar    MRI w. [facility] 05/2015, s/p ESI x 1 w/ improvement   Diaphragmatic hernia without mention of obstruction or gangrene    Diverticulosis of colon (without mention of hemorrhage)    DJD (degenerative joint disease) of knee    L>R, IA cortisone by [facility] in L 03/2015   Esophageal reflux    Gallstones    History of chicken pox    Hyperlipidemia    Hypertension    Irritable bowel syndrome    Migraines    Obesity, unspecified    Osteoporosis, unspecified    Tachycardia    Unspecified hemorrhoids with other complication    Past  Surgical History:  Procedure Laterality Date   [place]    reports that she has never smoked. She has never used smokeless tobacco. She reports that she does not drink alcohol and does not use drugs. family history includes Colon cancer in her sister; Heart disease in her brother and sister; Stroke in her mother; Stroke (age of onset: 51) in her sister. No Known Allergies Current Outpatient Medications on File Prior to Visit  Medication Sig Dispense Refill   Biotin 5 MG CAPS Taking daily  0   bisoprolol-hydrochlorothiazide (ZIAC) 5-6.25 MG tablet TAKE 1 TABLET BY MOUTH EVERY DAY 90 tablet 1   CALCIUM CITRATE-VITAMIN D PO Take 600 mg by mouth in the morning and at bedtime.     clobetasol ointment (TEMOVATE) 8.01 % Apply 1 application topically 2 (two) times daily.     COVID-19 mRNA vaccine, Moderna, 100 MCG/0.5ML injection Inject into the muscle. 0.5 mL 0   denosumab (PROLIA) 60 MG/ML SOLN injection Inject 60 mg into the skin every 6 (six) months. Administer in upper arm, thigh,  or abdomen     famotidine (PEPCID) 20 MG tablet Take 20 mg by mouth daily.     ibuprofen (ADVIL,MOTRIN) 200 MG tablet Take 600 mg by mouth 3 (three) times daily. With meals     Multiple Vitamins-Minerals (CENTRUM SILVER PO) Take by mouth daily.     omeprazole (PRILOSEC) 20 MG capsule TAKE 1 CAPSULE BY MOUTH EVERY DAY (Patient taking differently: Take 20 mg by mouth every other day.) 90 capsule 3   polyethylene glycol (MIRALAX / GLYCOLAX) packet Take 17 g by mouth daily. 2 capfuls daily     pravastatin (PRAVACHOL) 40 MG tablet Take 1 tablet (40 mg total) by mouth daily. 90 tablet 0   PROLIA 60 MG/ML SOSY injection INJECT 60 MG INTO THE SKIN ONCE FOR 1 DOSE. 60 mL 1   Wheat Dextrin (BENEFIBER) POWD Twice daily  0   No current facility-administered medications on file prior to visit.        ROS:  All others reviewed and  negative.  Objective        PE:  BP 140/80 (BP Location: Left Arm, Patient Position: Sitting, Cuff Size: Normal)   Pulse 62   Temp 98.5 F (36.9 C) (Oral)   Ht 5\' 3"  (1.6 m)   Wt 134 lb (60.8 kg)   SpO2 97%   BMI 23.74 kg/m                 Constitutional: Pt appears in NAD               HENT: Head: NCAT.                Right Ear: External ear normal.                 Left Ear: External ear normal.                Eyes: . Pupils are equal, round, and reactive to light. Conjunctivae and EOM are normal               Nose: without d/c or deformity               Neck: Neck supple. Gross normal ROM               Cardiovascular: Normal rate and regular rhythm.                 Pulmonary/Chest: Effort normal and breath sounds without rales or wheezing.                Abd:  Soft, NT, ND, + BS, no organomegaly               Neurological: Pt is alert. At baseline orientation, motor grossly intact               Skin: Skin is warm. No rashes, no other new lesions, LE edema - none               Psychiatric: Pt behavior is normal without agitation   Micro: none  Cardiac tracings I have personally interpreted today:  none  Pertinent Radiological findings (summarize): none   Lab Results  Component Value Date   WBC 11.0 (H) 01/17/2021   HGB 13.7 01/17/2021   HCT 40.3 01/17/2021   PLT 270.0 01/17/2021   GLUCOSE 91 01/17/2021   CHOL 207 (H) 09/17/2020   TRIG 176.0 (H) 09/17/2020   HDL 55.70 09/17/2020   LDLDIRECT 158.0 06/27/2015  LDLCALC 116 (H) 09/17/2020   ALT 11 01/17/2021   AST 14 01/17/2021   NA 140 01/17/2021   K 4.2 01/17/2021   CL 100 01/17/2021   CREATININE 0.72 01/17/2021   BUN 16 01/17/2021   CO2 33 (H) 01/17/2021   TSH 5.34 (H) 01/17/2021   HGBA1C 5.7 09/17/2020   Assessment/Plan:  [name] [name] is a 80 y.o. White or Caucasian [1] female with  has a past medical history of Benign neoplasm of colon, Cataract, DDD (degenerative disc disease), lumbar, Diaphragmatic hernia  without mention of obstruction or gangrene, Diverticulosis of colon (without mention of hemorrhage), DJD (degenerative joint disease) of knee, Esophageal reflux, Gallstones, History of chicken pox, Hyperlipidemia, Hypertension, Irritable bowel syndrome, Migraines, Obesity, unspecified, Osteoporosis, unspecified, Tachycardia, and Unspecified hemorrhoids with other complication.  Myalgia Relatively sudden onset, little to no other symptoms, pt non toxic, exam o/w benign in hx or physical findings; will need cxr, ua , ck and labs as ordered with tx pending any findings, and f/u PCP if not improved or any worsening  Hypertension BP Readings from Last 3 Encounters:  01/17/21 140/80  09/17/20 140/72  08/08/20 140/80   Stable, pt to continue medical treatment ziac   Hypothyroidism  Stable by hx, pt to continue levothyroxine as is, and check tsh with labs   Prediabetes Lab Results  Component Value Date   HGBA1C 5.7 09/17/2020   Stable, pt to continue current medical treatment  - diet  Followup: Return if symptoms worsen or fail to improve.  Cathlean Cower, MD 01/20/2021 2:34 PM [facility]

## 2021-01-18 ENCOUNTER — Encounter: Payer: Self-pay | Admitting: Internal Medicine

## 2021-01-18 ENCOUNTER — Telehealth: Payer: Self-pay | Admitting: Internal Medicine

## 2021-01-18 NOTE — Telephone Encounter (Signed)
    Please call patient to discuss lab results 

## 2021-01-18 NOTE — Telephone Encounter (Signed)
Results discussed patient would like to call and make appt at a later date.

## 2021-01-18 NOTE — Telephone Encounter (Signed)
Patient notified that it is fine to take two until it is gone.

## 2021-01-18 NOTE — Telephone Encounter (Signed)
   Patient called and said that she currently takes 25 mcg of Levothyroxine and was wondering if she could take 2 daily or if she needs to get the new prescription for 20mcg.  Please advise

## 2021-01-20 ENCOUNTER — Encounter: Payer: Self-pay | Admitting: Internal Medicine

## 2021-01-20 DIAGNOSIS — M791 Myalgia, unspecified site: Secondary | ICD-10-CM | POA: Insufficient documentation

## 2021-01-20 NOTE — Assessment & Plan Note (Signed)
BP Readings from Last 3 Encounters:  01/17/21 140/80  09/17/20 140/72  08/08/20 140/80   Stable, pt to continue medical treatment ziac

## 2021-01-20 NOTE — Assessment & Plan Note (Signed)
  Stable by hx, pt to continue levothyroxine as is, and check tsh with labs

## 2021-01-20 NOTE — Assessment & Plan Note (Signed)
Lab Results  Component Value Date   HGBA1C 5.7 09/17/2020   Stable, pt to continue current medical treatment  - diet

## 2021-01-20 NOTE — Assessment & Plan Note (Signed)
Relatively sudden onset, little to no other symptoms, pt non toxic, exam o/w benign in hx or physical findings; will need cxr, ua , ck and labs as ordered with tx pending any findings, and f/u PCP if not improved or any worsening

## 2021-01-23 NOTE — Progress Notes (Signed)
Subjective:    Patient ID: [name] [name], female    DOB: 10-24-1940, 80 y.o.   MRN: [id number]  HPI The patient is here for follow up of her pain in legs and arms  - saw Dr  Jenny Reichmann 6/23  Achy all over that started 3 weeks ago w/ URI ( dripping nose for a couple of days only), which resolved after 3 days.  Covid neg ( home test and [facility]).  Pain worse with movement.    She stopped pravastatin.  She is not seeing much improvement off medication.  Blood work, CK, esr, crp, urine, CXR normal, expect tsh slightly elevated and WBC 11.   Her thyroid dose was increased.    Her achiness is worse.  It hurts to sit or get up.  Her arms hurt to raise them R arm > l arm.  Tylenol does help, but she is still achy.    After she gets up and moves around her joint pain improves - after 10-15 min.   She is only taking tylenol.    She does yard work.  No animals.  She denies any known tick bites.  Medications and allergies reviewed with patient and updated if appropriate.  Patient Active Problem List   Diagnosis Date Noted   Myalgia 01/20/2021   Chronic dermatitis 01/17/2021   Urinary incontinence 01/17/2021   Thyroid dysfunction 01/17/2021   Hypertensive disorder 01/17/2021   Aortic atherosclerosis ([place]) 09/17/2020   Lower back pain 11/07/2019   Hearing loss of both ears due to cerumen impaction 07/05/2019   Closed compression fracture of lumbar vertebra ([place]), T11, T12, L1 12/30/2017   Lung nodules, stable, no f/u needed 12/30/2017   Hypothyroidism 11/01/2016   Constipation 04/30/2016   Prediabetes 11/01/2015   Spinal stenosis of lumbar region 10/03/2015   DDD (degenerative disc disease), lumbar    DJD (degenerative joint disease) of knee    Hypertension 11/25/2010   History of colon polyps 05/14/2009   G E R D 08/17/2007   Hyperlipidemia 08/16/2007   HIATAL HERNIA 08/16/2007   DIVERTICULAR DISEASE 08/16/2007   IBS 08/16/2007   Osteoporosis 08/16/2007    Current Outpatient  Medications on File Prior to Visit  Medication Sig Dispense Refill   Biotin 5 MG CAPS Taking daily  0   bisoprolol-hydrochlorothiazide (ZIAC) 5-6.25 MG tablet TAKE 1 TABLET BY MOUTH EVERY DAY 90 tablet 1   CALCIUM CITRATE-VITAMIN D PO Take 600 mg by mouth in the morning and at bedtime.     clobetasol ointment (TEMOVATE) 3.50 % Apply 1 application topically 2 (two) times daily.     denosumab (PROLIA) 60 MG/ML SOLN injection Inject 60 mg into the skin every 6 (six) months. Administer in upper arm, thigh, or abdomen     famotidine (PEPCID) 20 MG tablet Take 20 mg by mouth daily.     ibuprofen (ADVIL,MOTRIN) 200 MG tablet Take 600 mg by mouth 3 (three) times daily. With meals     levothyroxine (SYNTHROID) 50 MCG tablet Take 1 tablet (50 mcg total) by mouth daily. 90 tablet 3   Multiple Vitamins-Minerals (CENTRUM SILVER PO) Take by mouth daily.     omeprazole (PRILOSEC) 20 MG capsule TAKE 1 CAPSULE BY MOUTH EVERY DAY (Patient taking differently: Take 20 mg by mouth every other day.) 90 capsule 3   polyethylene glycol (MIRALAX / GLYCOLAX) packet Take 17 g by mouth daily. 2 capfuls daily     pravastatin (PRAVACHOL) 40 MG tablet Take 1 tablet (  40 mg total) by mouth daily. 90 tablet 0   PROLIA 60 MG/ML SOSY injection INJECT 60 MG INTO THE SKIN ONCE FOR 1 DOSE. 60 mL 1   Wheat Dextrin (BENEFIBER) POWD Twice daily  0   No current facility-administered medications on file prior to visit.    Past Medical History:  Diagnosis Date   Benign neoplasm of colon    Cataract    bilateral,had surgery   DDD (degenerative disc disease), lumbar    MRI w. [facility] 05/2015, s/p ESI x 1 w/ improvement   Diaphragmatic hernia without mention of obstruction or gangrene    Diverticulosis of colon (without mention of hemorrhage)    DJD (degenerative joint disease) of knee    L>R, IA cortisone by [facility] in L 03/2015   Esophageal reflux    Gallstones    History of chicken pox    Hyperlipidemia    Hypertension     Irritable bowel syndrome    Migraines    Obesity, unspecified    Osteoporosis, unspecified    Tachycardia    Unspecified hemorrhoids with other complication     Past Surgical History:  Procedure Laterality Date   APPENDECTOMY  1979   CHOLECYSTECTOMY  1989   COLONOSCOPY     POLYPECTOMY     VAGINAL HYSTERECTOMY  1996    Social History   Socioeconomic History   Marital status: Married    Spouse name: Not on file   Number of children: 2   Years of education: Not on file   Highest education level: Not on file  Occupational History   Occupation: retired  Tobacco Use   Smoking status: Never   Smokeless tobacco: Never  Vaping Use   Vaping Use: Never used  Substance and Sexual Activity   Alcohol use: No    Alcohol/week: 0.0 standard drinks   Drug use: No   Sexual activity: Not Currently  Other Topics Concern   Not on file  Social History Narrative   Exercise: no regular exercise   Social Determinants of Health   Financial Resource Strain: Low Risk    Difficulty of Paying Living Expenses: Not hard at all  Food Insecurity: No Food Insecurity   Worried About Running Out of Food in the Last Year: Never true   Ran Out of Food in the Last Year: Never true  Transportation Needs: No Transportation Needs   Lack of Transportation (Medical): No   Lack of Transportation (Non-Medical): No  Physical Activity: Inactive   Days of Exercise per Week: 0 days   Minutes of Exercise per Session: 0 min  Stress: No Stress Concern Present   Feeling of Stress : Not at all  Social Connections: Not on file    Family History  Problem Relation Age of Onset   Heart disease Sister        x 2   Heart disease Brother    Stroke Mother    Colon cancer Sister    Stroke Sister 83    Review of Systems  Constitutional:  Positive for appetite change and fatigue (increased). Negative for chills and fever.  HENT:  Negative for congestion, ear pain, sinus pain and sore throat.   Respiratory:   Negative for cough, shortness of breath and wheezing.   Cardiovascular:  Negative for chest pain.  Gastrointestinal:  Negative for abdominal pain, blood in stool, constipation, diarrhea and nausea.  Genitourinary:  Negative for dysuria, frequency and hematuria.  Musculoskeletal:  Positive for arthralgias (  b/l shoulders, hips, knees) and myalgias (arms and legs). Negative for joint swelling.  Skin:  Negative for rash.  Neurological:  Negative for dizziness, light-headedness, numbness and headaches.  Psychiatric/Behavioral:  Negative for sleep disturbance.       Objective:   Vitals:   01/24/21 0847  BP: (!) 144/80  Pulse: 73  Temp: 98.5 F (36.9 C)  SpO2: 96%   BP Readings from Last 3 Encounters:  01/24/21 (!) 144/80  01/17/21 140/80  09/17/20 140/72   Wt Readings from Last 3 Encounters:  01/24/21 134 lb (60.8 kg)  01/17/21 134 lb (60.8 kg)  09/17/20 133 lb (60.3 kg)   Body mass index is 23.74 kg/m.   Physical Exam    Constitutional: Appears well-developed and well-nourished. No distress.  HENT:  Head: Normocephalic and atraumatic.  Neck: Neck supple. No tracheal deviation present. No thyromegaly present.  No cervical lymphadenopathy Cardiovascular: Normal rate, regular rhythm and normal heart sounds.   No murmur heard. No carotid bruit .  No edema Pulmonary/Chest: Effort normal and breath sounds normal. No respiratory distress. No has no wheezes. No rales. Musculoskeletal: No joint swelling or obvious synovitis, increased pain with movement of shoulders.  Slight muscle tenderness in upper arms, lower arms, upper legs and lower legs.  No muscle tenderness in back Skin: Skin is warm and dry. Not diaphoretic.  Psychiatric: Normal mood and affect. Behavior is normal.      Assessment & Plan:    See Problem List for Assessment and Plan of chronic medical problems.    This visit occurred during the SARS-CoV-2 public health emergency.  Safety protocols were in place,  including screening questions prior to the visit, additional usage of staff PPE, and extensive cleaning of exam room while observing appropriate contact time as indicated for disinfecting solutions.

## 2021-01-24 ENCOUNTER — Other Ambulatory Visit: Payer: Self-pay

## 2021-01-24 ENCOUNTER — Encounter: Payer: Self-pay | Admitting: Internal Medicine

## 2021-01-24 ENCOUNTER — Ambulatory Visit (INDEPENDENT_AMBULATORY_CARE_PROVIDER_SITE_OTHER): Payer: PPO | Admitting: Internal Medicine

## 2021-01-24 VITALS — BP 144/80 | HR 73 | Temp 98.5°F | Ht 63.0 in | Wt 134.0 lb

## 2021-01-24 DIAGNOSIS — M2550 Pain in unspecified joint: Secondary | ICD-10-CM | POA: Diagnosis not present

## 2021-01-24 LAB — SEDIMENTATION RATE: Sed Rate: 27 mm/hr (ref 0–30)

## 2021-01-24 MED ORDER — PREDNISONE 10 MG PO TABS: ORAL_TABLET | ORAL | 0 refills | Status: DC

## 2021-01-24 NOTE — Patient Instructions (Addendum)
   Blood work was ordered.      Medications changes include :   prednisone taper.  continue the tylenol    Call if your symptoms do not improve.

## 2021-01-24 NOTE — Assessment & Plan Note (Signed)
Subacute Started 3-weeks ago with very mild URI symptoms that resolved quickly Joint pain mostly shoulders, mild in elbows, hips, knees Muscle pain in arms and legs Associated with decreased appetite and fatigue ?  Reactive arthritis-possible, but I would have expected it to have resolved by now ?  Lyme disease-we will check for Lyme ?  Autoimmune -  Inflammatory markers within normal limits-we will recheck ESR, ANA, RF, CCP We will try prednisone taper to see if that improves her joint and muscle pain Further evaluation depending on blood work and if she has improvement with the steroids

## 2021-01-25 ENCOUNTER — Telehealth: Payer: Self-pay | Admitting: Pharmacist

## 2021-01-25 NOTE — Progress Notes (Signed)
Chronic Care Management Pharmacy Assistant   Name: [name] [name]  MRN: [id number] DOB: January 09, 1941   Reason for Encounter: Disease State   Conditions to be addressed/monitored: HTN   Recent office visits:  01/24/21 Dr. Quay Burow for Leg pain, started on prednisone 10 mg  Recent consult visits:  01/17/21 Dr. Cathlean Cower [facility] for body aches   Hospital visits:  None in previous 6 months  Medications: Outpatient Encounter Medications as of 01/25/2021  Medication Sig   Biotin 5 MG CAPS Taking daily   bisoprolol-hydrochlorothiazide (ZIAC) 5-6.25 MG tablet TAKE 1 TABLET BY MOUTH EVERY DAY   CALCIUM CITRATE-VITAMIN D PO Take 600 mg by mouth in the morning and at bedtime.   clobetasol ointment (TEMOVATE) 5.53 % Apply 1 application topically 2 (two) times daily.   denosumab (PROLIA) 60 MG/ML SOLN injection Inject 60 mg into the skin every 6 (six) months. Administer in upper arm, thigh, or abdomen   famotidine (PEPCID) 20 MG tablet Take 20 mg by mouth daily.   ibuprofen (ADVIL,MOTRIN) 200 MG tablet Take 600 mg by mouth 3 (three) times daily. With meals   levothyroxine (SYNTHROID) 50 MCG tablet Take 1 tablet (50 mcg total) by mouth daily.   Multiple Vitamins-Minerals (CENTRUM SILVER PO) Take by mouth daily.   omeprazole (PRILOSEC) 20 MG capsule TAKE 1 CAPSULE BY MOUTH EVERY DAY (Patient taking differently: Take 20 mg by mouth every other day.)   polyethylene glycol (MIRALAX / GLYCOLAX) packet Take 17 g by mouth daily. 2 capfuls daily   pravastatin (PRAVACHOL) 40 MG tablet Take 1 tablet (40 mg total) by mouth daily.   predniSONE (DELTASONE) 10 MG tablet Take 4 tabs po qd x 2 days, then 3 tabs po qd x 2 days, then 2 tabs po qd x 2 days, then 1 tab po qd x 2 days   PROLIA 60 MG/ML SOSY injection INJECT 60 MG INTO THE SKIN ONCE FOR 1 DOSE.   Wheat Dextrin (BENEFIBER) POWD Twice daily   No facility-administered encounter medications on file as of 01/25/2021.    Pharmacist  Review Reviewed chart prior to disease state call. Spoke with patient regarding BP  Recent Office Vitals: BP Readings from Last 3 Encounters:  01/24/21 (!) 144/80  01/17/21 140/80  09/17/20 140/72   Pulse Readings from Last 3 Encounters:  01/24/21 73  01/17/21 62  09/17/20 (!) 58    Wt Readings from Last 3 Encounters:  01/24/21 134 lb (60.8 kg)  01/17/21 134 lb (60.8 kg)  09/17/20 133 lb (60.3 kg)     Kidney Function Lab Results  Component Value Date/Time   CREATININE 0.72 01/17/2021 10:33 AM   CREATININE 0.64 09/17/2020 11:24 AM   CREATININE 0.75 03/12/2020 11:45 AM   GFR 78.20 01/17/2021 10:33 AM   GFRNONAA 75 03/12/2020 11:45 AM   GFRAA 87 03/12/2020 11:45 AM    BMP Latest Ref Rng & Units 01/17/2021 09/17/2020 03/12/2020  Glucose 70 - 99 mg/dL 91 98 86  BUN 6 - 23 mg/dL 16 16 12   Creatinine 0.40 - 1.20 mg/dL 0.72 0.64 0.75  BUN/Creat Ratio 6 - 22 (calc) - - NOT APPLICABLE  Sodium 748 - 145 mEq/L 140 139 141  Potassium 3.5 - 5.1 mEq/L 4.2 4.0 4.6  Chloride 96 - 112 mEq/L 100 100 101  CO2 19 - 32 mEq/L 33(H) 31 32  Calcium 8.4 - 10.5 mg/dL 10.0 10.0 10.9(H)    Current antihypertensive regimen:  Bisoprolol-HCTZ 5-6.25 mg daily  How often  are you checking your Blood Pressure? 1-2x per week  Current home BP readings: Patient states that she has had her blood pressure taken twice this week at Dr. Cathlean Cower office 140/80 and at Dr. Quay Burow 144/80  What recent interventions/DTPs have been made by any provider to improve Blood Pressure control since last CPP Visit: continue current medication and monitor blood pressure at home  Any recent hospitalizations or ED visits since last visit with CPP? No  What diet changes have been made to improve Blood Pressure Control?  Patient states that she has not made any changes to diet  What exercise is being done to improve your Blood Pressure Control?  Patient states that she does not exercise  Adherence Review: Is the patient  currently on ACE/ARB medication? No Does the patient have >5 day gap between last estimated fill dates? No   Star Rating Drugs: Pravastatin 12/17/20 90 ds ( Patient states that she is not taking, per Dr. Quay Burow)  Ethelene Hal Clinical Pharmacist Assistant [phone]   Time spent:30

## 2021-01-31 LAB — B. BURGDORFI ANTIBODIES BY WB
B burgdorferi IgG Abs (IB): NEGATIVE
B burgdorferi IgM Abs (IB): NEGATIVE
Lyme Disease 18 kD IgG: NONREACTIVE
Lyme Disease 23 kD IgG: NONREACTIVE
Lyme Disease 23 kD IgM: REACTIVE — AB
Lyme Disease 28 kD IgG: NONREACTIVE
Lyme Disease 30 kD IgG: NONREACTIVE
Lyme Disease 39 kD IgG: NONREACTIVE
Lyme Disease 39 kD IgM: NONREACTIVE
Lyme Disease 41 kD IgG: NONREACTIVE
Lyme Disease 41 kD IgM: NONREACTIVE
Lyme Disease 45 kD IgG: NONREACTIVE
Lyme Disease 58 kD IgG: NONREACTIVE
Lyme Disease 66 kD IgG: NONREACTIVE
Lyme Disease 93 kD IgG: NONREACTIVE

## 2021-01-31 LAB — ANA: Anti Nuclear Antibody (ANA): POSITIVE — AB

## 2021-01-31 LAB — RHEUMATOID FACTOR: Rheumatoid fact SerPl-aCnc: <14 IU/mL (ref <14)

## 2021-01-31 LAB — ANTI-NUCLEAR AB-TITER (ANA TITER): ANA Titer 1: 1:320 {titer} — ABNORMAL HIGH

## 2021-01-31 LAB — CYCLIC CITRUL PEPTIDE ANTIBODY, IGG: Cyclic Citrullin Peptide Ab: <16 UNITS

## 2021-01-31 NOTE — Addendum Note (Signed)
Addended by: Binnie Rail on: 01/31/2021 07:40 PM   Modules accepted: Orders

## 2021-02-05 ENCOUNTER — Telehealth: Payer: Self-pay | Admitting: Internal Medicine

## 2021-02-05 NOTE — Telephone Encounter (Signed)
Did the prednisone help?   It is not good to do prednisone long term - we can try a 5 mg dose daily to see if that helps

## 2021-02-05 NOTE — Telephone Encounter (Signed)
  Patient called and was wondering if she could continue  predniSONE (DELTASONE) 10 MG tablet she said that she is still having arm and leg aches. She said that she has a rheumatologist appointment on  8/12. She said that it can be sent to [id number] - [place], [place] - [address]

## 2021-02-06 MED ORDER — PREDNISONE 5 MG PO TABS: 5.0000 mg | ORAL_TABLET | Freq: Every day | ORAL | 1 refills | Status: DC

## 2021-02-06 NOTE — Telephone Encounter (Signed)
Prescription sent to pharmacy.

## 2021-02-06 NOTE — Addendum Note (Signed)
Addended by: Binnie Rail on: 02/06/2021 12:27 PM   Modules accepted: Orders

## 2021-02-07 ENCOUNTER — Other Ambulatory Visit: Payer: Self-pay | Admitting: Internal Medicine

## 2021-02-07 DIAGNOSIS — Z1231 Encounter for screening mammogram for malignant neoplasm of breast: Secondary | ICD-10-CM

## 2021-03-07 NOTE — Progress Notes (Signed)
Office Visit Note  Patient: [name] [name]             Date of Birth: 09-02-1940           MRN: [id number]             PCP: Binnie Rail, MD Referring: Binnie Rail, MD Visit Date: 03/08/2021  Subjective:  New Patient (Initial Visit) (Bil arm, leg, groin and hip pain, )   History of Present Illness: [name] [name] is a 80 y.o. female here for positive ANA and body aches in multiple sites. Symptoms developed following some URI type symptoms that resolved quickly but with ongoing joint and muscles aches including bilateral shoulders. She experienced symptoms improvement with oral prednisone quickly within about 2 days of starting that medication.  She denies ever having similar symptoms in the past.  She has not noticed any particular swelling redness or warmth in the involved areas.  There is very prolonged morning stiffness sometimes lasting throughout the day.  She was previously concerned with muscle aches and weakness could represent statin induced myopathy so stopped taking the statin medication and did not notice any difference in symptoms. She denies any new rashes, oral ulcers, persistent lymphadenopathy, weight loss, Raynaud's symptoms, or history of blood clots. She reports thinks there was some type of imaging of her shoulders obtained in Dr. Eilleen Kempf office that was not available for review.  Labs reviewed ANA 1:320 DFS Lyme neg CCP neg RF neg ESR 27 CRP neg LFTs wnl CBC WBC 11.0 TSH 5.34 Urinalysis trace Hgb CK 44   Activities of Daily Living:  Patient reports morning stiffness for 5 hours.   Patient Denies nocturnal pain.  Difficulty dressing/grooming: Reports Difficulty climbing stairs: Denies Difficulty getting out of chair: Reports Difficulty using hands for taps, buttons, cutlery, and/or writing: Denies  Review of Systems  Constitutional:  Positive for fatigue.  HENT:  Negative for mouth dryness.   Eyes:  Positive for dryness.  Respiratory:  Negative for  shortness of breath.   Cardiovascular:  Negative for swelling in legs/feet.  Gastrointestinal:  Negative for constipation.  Endocrine: Negative for excessive thirst.  Genitourinary:  Positive for involuntary urination.  Musculoskeletal:  Positive for joint pain, gait problem, joint pain, joint swelling, muscle weakness, morning stiffness and muscle tenderness.  Skin:  Negative for rash.  Allergic/Immunologic: Negative for susceptible to infections.  Neurological:  Positive for numbness.  Hematological:  Negative for bruising/bleeding tendency.  Psychiatric/Behavioral:  Negative for sleep disturbance.    PMFS History:  Patient Active Problem List   Diagnosis Date Noted   Bilateral shoulder pain 03/08/2021   Bilateral hip pain 03/08/2021   Positive ANA (antinuclear antibody) 03/08/2021   Myalgia 01/20/2021   Chronic dermatitis 01/17/2021   Urinary incontinence 01/17/2021   Thyroid dysfunction 01/17/2021   Hypertensive disorder 01/17/2021   Aortic atherosclerosis ([place]) 09/17/2020   Lower back pain 11/07/2019   Hearing loss of both ears due to cerumen impaction 07/05/2019   Closed compression fracture of lumbar vertebra ([place]), T11, T12, L1 12/30/2017   Lung nodules, stable, no f/u needed 12/30/2017   Hypothyroidism 11/01/2016   Constipation 04/30/2016   Prediabetes 11/01/2015   Spinal stenosis of lumbar region 10/03/2015   DDD (degenerative disc disease), lumbar    DJD (degenerative joint disease) of knee    Hypertension 11/25/2010   History of colon polyps 05/14/2009   Arthralgia 02/06/2009   G E R D 08/17/2007   Hyperlipidemia 08/16/2007   HIATAL  HERNIA 08/16/2007   DIVERTICULAR DISEASE 08/16/2007   IBS 08/16/2007   Osteoporosis 08/16/2007    Past Medical History:  Diagnosis Date   Benign neoplasm of colon    Cataract    bilateral,had surgery   DDD (degenerative disc disease), lumbar    MRI w. [facility] 05/2015, s/p ESI x 1 w/ improvement   Diaphragmatic hernia  without mention of obstruction or gangrene    Diverticulosis of colon (without mention of hemorrhage)    DJD (degenerative joint disease) of knee    L>R, IA cortisone by [facility] in L 03/2015   Esophageal reflux    Gallstones    History of chicken pox    Hyperlipidemia    Hypertension    Irritable bowel syndrome    Migraines    Obesity, unspecified    Osteoporosis, unspecified    Tachycardia    Unspecified hemorrhoids with other complication     Family History  Problem Relation Age of Onset   Stroke Mother    Dementia Mother    Stroke Father    Heart disease Sister        x 2   Colon cancer Sister    Stroke Sister 83   Heart disease Brother    Past Surgical History:  Procedure Laterality Date   APPENDECTOMY  1979   CATARACT EXTRACTION     CHOLECYSTECTOMY  1989   COLONOSCOPY     POLYPECTOMY     VAGINAL HYSTERECTOMY  1996   Social History   Social History Narrative   Exercise: no regular exercise   Immunization History  Administered Date(s) Administered   Fluad Quad(high Dose 65+) 05/12/2019, 06/11/2020   Influenza Split 05/28/2011, 06/23/2012   Influenza Whole 05/25/2007, 05/31/2010   Influenza, High Dose Seasonal PF 06/27/2015, 04/30/2016, 05/01/2017, 05/04/2018   Influenza,inj,Quad PF,6+ Mos 04/20/2013, 06/13/2014   Moderna SARS-COV2 Booster Vaccination 07/05/2020, 12/12/2020   Moderna Sars-Covid-2 Vaccination 08/08/2019, 09/05/2019   Pneumococcal Conjugate-13 10/31/2014   Pneumococcal Polysaccharide-23 10/07/2013   Td 10/07/2013   Zoster, Live 10/31/2014     Objective: Vital Signs: BP (!) 153/68 (BP Location: Right Arm, Patient Position: Sitting, Cuff Size: Normal)   Pulse 61   Resp 16   Ht 4' 11"  (1.499 m)   Wt 137 lb (62.1 kg)   BMI 27.67 kg/m    Physical Exam HENT:     Mouth/Throat:     Mouth: Mucous membranes are moist.     Pharynx: Oropharynx is clear.  Eyes:     Conjunctiva/sclera: Conjunctivae normal.  Cardiovascular:     Rate and  Rhythm: Normal rate and regular rhythm.  Pulmonary:     Effort: Pulmonary effort is normal.     Breath sounds: Normal breath sounds.  Skin:    General: Skin is warm and dry.     Findings: No rash.  Neurological:     Mental Status: She is alert.     Motor: No weakness.     Comments: Motor strength is intact with bilateral shoulder abduction and hip flexion but limited by pain with active range of motion and against resisted abduction  Psychiatric:        Mood and Affect: Mood normal.     Musculoskeletal Exam:  Shoulders full ROM, with pain and overhead range of motion bilaterally, tenderness to palpation anteriorly and laterally with no obvious swelling or warmth Elbows, wrists, fingers full ROM no swelling Hip rotation range of motion is normal, resisted hip flexion limited by pain in  the anterior thigh   Investigation: No additional findings.  Imaging: No results found.  Recent Labs: Lab Results  Component Value Date   WBC 11.0 (H) 01/17/2021   HGB 13.7 01/17/2021   PLT 270.0 01/17/2021   NA 140 01/17/2021   K 4.2 01/17/2021   CL 100 01/17/2021   CO2 33 (H) 01/17/2021   GLUCOSE 91 01/17/2021   BUN 16 01/17/2021   CREATININE 0.72 01/17/2021   BILITOT 0.8 01/17/2021   ALKPHOS 43 01/17/2021   AST 14 01/17/2021   ALT 11 01/17/2021   PROT 7.8 01/17/2021   ALBUMIN 4.3 01/17/2021   CALCIUM 10.0 01/17/2021   GFRAA 87 03/12/2020    Speciality Comments: No specialty comments available.  Procedures:  No procedures performed Allergies: Patient has no known allergies.   Assessment / Plan:     Visit Diagnoses: Chronic pain of both shoulders  Bilateral hip pain - Plan: predniSONE (DELTASONE) 10 MG tablet  Symptoms are suggestive for possible polymyalgia rheumatica though cannot exclude a reactive arthritis process after recent illness.  The preservation of objective motor strength with range of motion limited by pain and stiffness and proximal muscles in sterile  responsiveness are very characteristic.  Recommend trial of prednisone at 10 mg p.o. daily for response.  Would be interested in baseline imaging of the shoulders for other underlying arthritis process she reports having recent imaging I do not see anything at this time.  Age related osteoporosis, unspecified pathological fracture presence  Existing history of osteoporosis and increased risk for fracture worsening demineralization with prolonged steroid treatment so we will resume but at a low dose.  If focal areas of persistent symptoms recommend trial of local therapy to decrease exposure.  Myalgia - Plan: Anti-HMGCR Ab IgG, predniSONE (DELTASONE) 10 MG tablet  Think statin induced myopathy seems unlikely as cause of symptoms we will check hCG we can go a reductase antibody titer.  Positive ANA (antinuclear antibody)  Hypothyroidism due to Hashimoto's thyroiditis - Plan: Sjogrens syndrome-A extractable nuclear antibody, Anti-Smith antibody, Sjogrens syndrome-B extractable nuclear antibody, Anti-DNA antibody, double-stranded  Positive ANA but without specific clinical criteria of disease on exam or history.  She has a history of hypothyroidism secondary to Hashimoto's thyroiditis, and thyroperoxidase antibodies are relatively common cause of positive ANA dense fine stranded pattern.  We will check specific serologies if everything is negative would consider this low suspicion.   Orders: Orders Placed This Encounter  Procedures   Sedimentation rate   C-reactive protein   Sjogrens syndrome-A extractable nuclear antibody   Anti-Smith antibody   Sjogrens syndrome-B extractable nuclear antibody   Anti-DNA antibody, double-stranded   Anti-HMGCR Ab IgG   3-Hydroxy-3-Methylglutaryl-Coenzyme A Reductase (HMGCR) AB (IgG)    Meds ordered this encounter  Medications   predniSONE (DELTASONE) 10 MG tablet    Sig: Take 1 tablet (10 mg total) by mouth daily with breakfast for 20 days.    Dispense:   20 tablet    Refill:  0      Follow-Up Instructions: Return in about 2 weeks (around 03/22/2021) for New pt ?PMR f/u 2wks.   Collier Salina, MD  Note - This record has been created using [employer].  Chart creation errors have been sought, but may not always  have been located. Such creation errors do not reflect on  the standard of medical care.

## 2021-03-08 ENCOUNTER — Other Ambulatory Visit: Payer: Self-pay

## 2021-03-08 ENCOUNTER — Ambulatory Visit: Payer: PPO | Admitting: Internal Medicine

## 2021-03-08 ENCOUNTER — Encounter: Payer: Self-pay | Admitting: Internal Medicine

## 2021-03-08 VITALS — BP 153/68 | HR 61 | Resp 16 | Ht 59.0 in | Wt 137.0 lb

## 2021-03-08 DIAGNOSIS — M791 Myalgia, unspecified site: Secondary | ICD-10-CM

## 2021-03-08 DIAGNOSIS — E038 Other specified hypothyroidism: Secondary | ICD-10-CM

## 2021-03-08 DIAGNOSIS — E063 Autoimmune thyroiditis: Secondary | ICD-10-CM | POA: Diagnosis not present

## 2021-03-08 DIAGNOSIS — M25551 Pain in right hip: Secondary | ICD-10-CM | POA: Diagnosis not present

## 2021-03-08 DIAGNOSIS — M25511 Pain in right shoulder: Secondary | ICD-10-CM

## 2021-03-08 DIAGNOSIS — G8929 Other chronic pain: Secondary | ICD-10-CM

## 2021-03-08 DIAGNOSIS — M25552 Pain in left hip: Secondary | ICD-10-CM | POA: Diagnosis not present

## 2021-03-08 DIAGNOSIS — R768 Other specified abnormal immunological findings in serum: Secondary | ICD-10-CM | POA: Diagnosis not present

## 2021-03-08 DIAGNOSIS — M25512 Pain in left shoulder: Secondary | ICD-10-CM | POA: Diagnosis not present

## 2021-03-08 DIAGNOSIS — M810 Age-related osteoporosis without current pathological fracture: Secondary | ICD-10-CM

## 2021-03-08 MED ORDER — PREDNISONE 10 MG PO TABS: 10.0000 mg | ORAL_TABLET | Freq: Every day | ORAL | 0 refills | Status: DC

## 2021-03-08 NOTE — Patient Instructions (Signed)
I am suspicious your symptoms represent a type of inflammation around the joints called polymyalgia rheumatica. I am checking some additional tests to rule out alternate causes.  Polymyalgia Rheumatica Polymyalgia rheumatica (PMR) is an inflammatory disorder that causes the muscles and joints to ache and become stiff. Sometimes, PMR leads to a more dangerous condition that can cause vision loss (temporal arteritis or giant cell arteritis). What are the causes? The exact cause of PMR is not known. What increases the risk? You are more likely to develop this condition if you are: Female. 82 years of age or older. Caucasian. What are the signs or symptoms? Pain and stiffness are the main symptoms of PMR. Symptoms may: Be worse after inactivity and in the morning. Affect your: Hips, buttocks, and thighs. Neck, arms, and shoulders. This can make it hard to raise your arms above your head. Hands and wrists. Other symptoms include: Fever. Tiredness. Weakness. Depression. Decreased appetite. This may lead to weight loss. Symptoms may start slowly or suddenly. How is this diagnosed? This condition is diagnosed with your medical history and a physical exam. You may need to see a health care provider who specializes in diseases of the joints, muscles, and bones (rheumatologist). You may also have tests, including: Blood tests. X-rays. Ultrasound. How is this treated? PMR usually goes away without treatment, but it may take years. Your health care provider may recommend low-dose steroids and other medicines to help manage your symptoms of pain and stiffness. Regular exercise and rest will alsohelp your symptoms. Follow these instructions at home:  Take over-the-counter and prescription medicines only as told by your health care provider. Make sure to get enough rest and sleep. Eat a healthy and nutritious diet. Try to exercise most days of the week. Ask your health care provider what type of  exercise is best for you. Keep all follow-up visits as told by your health care provider. This is important. Contact a health care provider if: Your symptoms do not improve with medicine. You have side effects from steroids. These may include: Weight gain. Swelling. Insomnia. Mood changes. Bruising. High blood sugar readings, if you have diabetes. Higher than normal blood pressure readings, if you monitor your blood pressure. Get help right away if: You develop symptoms of temporal arteritis, such as: A change in vision. Severe headache. Scalp pain. Jaw pain. Summary Polymyalgia rheumatica is an inflammatory disorder that causes aching and stiffness in your muscles and joints. The exact cause of this condition is not known. This condition usually goes away without treatment. Your health care provider may give you low-dose steroids to help manage your pain and stiffness. Rest and regular exercise will help the symptoms. This information is not intended to replace advice given to you by your health care provider. Make sure you discuss any questions you have with your healthcare provider. Document Revised: 05/20/2018 Document Reviewed: 05/20/2018 Elsevier Patient Education  2022 [employer].

## 2021-03-12 LAB — SJOGRENS SYNDROME-B EXTRACTABLE NUCLEAR ANTIBODY: SSB (La) (ENA) Antibody, IgG: <1 AI

## 2021-03-12 LAB — C-REACTIVE PROTEIN: CRP: 8.2 mg/L — ABNORMAL HIGH (ref <8.0)

## 2021-03-12 LAB — HMGCR AB (IGG): HMGCR AB (IGG): <2 CU (ref <20)

## 2021-03-12 LAB — ANTI-DNA ANTIBODY, DOUBLE-STRANDED: ds DNA Ab: 4 IU/mL

## 2021-03-12 LAB — ANTI-SMITH ANTIBODY: ENA SM Ab Ser-aCnc: <1 AI

## 2021-03-12 LAB — SJOGRENS SYNDROME-A EXTRACTABLE NUCLEAR ANTIBODY: SSA (Ro) (ENA) Antibody, IgG: <1 AI

## 2021-03-12 LAB — SEDIMENTATION RATE: Sed Rate: 14 mm/h (ref 0–30)

## 2021-03-14 NOTE — Progress Notes (Signed)
The CRP is mildly elevated may represent inflammation such as we would expect with PMR. Test for antibodies against the statin medications is negative and muscle enzymes are normal so I do not suspect that problem. I don't see any evidence for lupus or related disease at this time.

## 2021-03-15 ENCOUNTER — Other Ambulatory Visit: Payer: Self-pay | Admitting: Internal Medicine

## 2021-03-15 DIAGNOSIS — E7849 Other hyperlipidemia: Secondary | ICD-10-CM

## 2021-03-15 DIAGNOSIS — I700 Atherosclerosis of aorta: Secondary | ICD-10-CM

## 2021-03-18 ENCOUNTER — Ambulatory Visit: Payer: PPO | Admitting: Internal Medicine

## 2021-03-19 DIAGNOSIS — M353 Polymyalgia rheumatica: Secondary | ICD-10-CM | POA: Insufficient documentation

## 2021-03-19 NOTE — Patient Instructions (Addendum)
    Blood work was ordered.      Medications changes include :   none     Please followup in 6 months  

## 2021-03-19 NOTE — Progress Notes (Signed)
Subjective:    Patient ID: [name] [name], female    DOB: 1941/03/10, 80 y.o.   MRN: [id number]  HPI The patient is here for follow up of their chronic medical problems, including htn, hichol, gerd, hypothyroidism, OP on prolia, prediabetes, PMR - Dr Benjamine Mola  PMR - b/l upper armand leg pain - Her pain is terrible in the morning but by the afternoon she is better.  The prednisone is not helping.  Tylenol does not help.  She has intermittent whole body weakness.  She has follow-up with Dr. Benjamine Mola on Monday.  She is taking her medications as prescribed.  Medications and allergies reviewed with patient and updated if appropriate.  Patient Active Problem List   Diagnosis Date Noted   PMR (polymyalgia rheumatica) ([facility]) - Dr Benjamine Mola 03/19/2021   Bilateral shoulder pain 03/08/2021   Bilateral hip pain 03/08/2021   Positive ANA (antinuclear antibody) 03/08/2021   Myalgia 01/20/2021   Chronic dermatitis 01/17/2021   Urinary incontinence 01/17/2021   Hypertensive disorder 01/17/2021   Aortic atherosclerosis ([place]) 09/17/2020   Lower back pain 11/07/2019   Hearing loss of both ears due to cerumen impaction 07/05/2019   Closed compression fracture of lumbar vertebra ([place]), T11, T12, L1 12/30/2017   Lung nodules, stable, no f/u needed 12/30/2017   Hypothyroidism 11/01/2016   Constipation 04/30/2016   Prediabetes 11/01/2015   Spinal stenosis of lumbar region 10/03/2015   DDD (degenerative disc disease), lumbar    DJD (degenerative joint disease) of knee    Hypertension 11/25/2010   History of colon polyps 05/14/2009   Arthralgia 02/06/2009   G E R D 08/17/2007   Hyperlipidemia 08/16/2007   HIATAL HERNIA 08/16/2007   DIVERTICULAR DISEASE 08/16/2007   IBS 08/16/2007   Osteoporosis on prolia 08/16/2007    Current Outpatient Medications on File Prior to Visit  Medication Sig Dispense Refill   acetaminophen (TYLENOL) 650 MG CR tablet Take 650 mg by mouth every 8 (eight) hours as needed for pain.      Biotin 5 MG CAPS Taking daily  0   bisoprolol-hydrochlorothiazide (ZIAC) 5-6.25 MG tablet TAKE 1 TABLET BY MOUTH EVERY DAY 90 tablet 1   CALCIUM CITRATE-VITAMIN D PO Take 600 mg by mouth in the morning and at bedtime.     clobetasol ointment (TEMOVATE) [id number] % Apply 1 application topically 2 (two) times daily.     denosumab (PROLIA) 60 MG/ML SOLN injection Inject 60 mg into the skin every 6 (six) months. Administer in upper arm, thigh, or abdomen     famotidine (PEPCID) 20 MG tablet Take 20 mg by mouth daily.     ibuprofen (ADVIL,MOTRIN) 200 MG tablet Take 600 mg by mouth 3 (three) times daily. With meals     levothyroxine (SYNTHROID) 50 MCG tablet Take 1 tablet (50 mcg total) by mouth daily. 90 tablet 3   Multiple Vitamins-Minerals (CENTRUM SILVER PO) Take by mouth daily.     omeprazole (PRILOSEC) 20 MG capsule TAKE 1 CAPSULE BY MOUTH EVERY DAY (Patient taking differently: Take 20 mg by mouth every other day.) 90 capsule 3   polyethylene glycol (MIRALAX / GLYCOLAX) packet Take 17 g by mouth daily. 2 capfuls daily     pravastatin (PRAVACHOL) 40 MG tablet TAKE 1 TABLET BY MOUTH EVERY DAY 90 tablet 0   predniSONE (DELTASONE) 10 MG tablet Take 1 tablet (10 mg total) by mouth daily with breakfast for 20 days. 20 tablet 0   PROLIA 60 MG/ML SOSY injection INJECT 60  MG INTO THE SKIN ONCE FOR 1 DOSE. 60 mL 1   Wheat Dextrin (BENEFIBER) POWD Twice daily  0   No current facility-administered medications on file prior to visit.    Past Medical History:  Diagnosis Date   Benign neoplasm of colon    Cataract    bilateral,had surgery   DDD (degenerative disc disease), lumbar    MRI w. [facility] 05/2015, s/p ESI x 1 w/ improvement   Diaphragmatic hernia without mention of obstruction or gangrene    Diverticulosis of colon (without mention of hemorrhage)    DJD (degenerative joint disease) of knee    L>R, IA cortisone by [facility] in L 03/2015   Esophageal reflux    Gallstones    History of chicken  pox    Hyperlipidemia    Hypertension    Irritable bowel syndrome    Migraines    Obesity, unspecified    Osteoporosis, unspecified    Tachycardia    Unspecified hemorrhoids with other complication     Past Surgical History:  Procedure Laterality Date   APPENDECTOMY  1979   CATARACT EXTRACTION     CHOLECYSTECTOMY  1989   COLONOSCOPY     POLYPECTOMY     VAGINAL HYSTERECTOMY  1996    Social History   Socioeconomic History   Marital status: Married    Spouse name: Not on file   Number of children: 2   Years of education: Not on file   Highest education level: Not on file  Occupational History   Occupation: retired  Tobacco Use   Smoking status: Never   Smokeless tobacco: Never  Vaping Use   Vaping Use: Never used  Substance and Sexual Activity   Alcohol use: No    Alcohol/week: 0.0 standard drinks   Drug use: No   Sexual activity: Not Currently  Other Topics Concern   Not on file  Social History Narrative   Exercise: no regular exercise   Social Determinants of Health   Financial Resource Strain: Not on file  Food Insecurity: No Food Insecurity   Worried About Charity fundraiser in the Last Year: Never true   Arboriculturist in the Last Year: Never true  Transportation Needs: No Transportation Needs   Lack of Transportation (Medical): No   Lack of Transportation (Non-Medical): No  Physical Activity: Inactive   Days of Exercise per Week: 0 days   Minutes of Exercise per Session: 0 min  Stress: No Stress Concern Present   Feeling of Stress : Not at all  Social Connections: Not on file    Family History  Problem Relation Age of Onset   Stroke Mother    Dementia Mother    Stroke Father    Heart disease Sister        x 2   Colon cancer Sister    Stroke Sister 66   Heart disease Brother     Review of Systems  Constitutional:  Negative for fever.  Eyes:  Negative for visual disturbance.  Respiratory:  Negative for cough, shortness of breath and  wheezing.   Cardiovascular:  Positive for palpitations. Negative for chest pain and leg swelling.  Musculoskeletal:  Positive for myalgias.  Neurological:  Negative for light-headedness and headaches.      Objective:   Vitals:   03/20/21 1040  BP: 130/70  Pulse: 65  Temp: 98.8 F (37.1 C)  SpO2: 96%   BP Readings from Last 3 Encounters:  03/20/21  130/70  03/08/21 (!) 153/68  01/24/21 (!) 144/80   Wt Readings from Last 3 Encounters:  03/20/21 136 lb 3.2 oz (61.8 kg)  03/08/21 137 lb (62.1 kg)  01/24/21 134 lb (60.8 kg)   Body mass index is 27.51 kg/m.   Physical Exam    Constitutional: Appears well-developed and well-nourished. No distress.  HENT:  Head: Normocephalic and atraumatic.  Neck: Neck supple. No tracheal deviation present. No thyromegaly present.  No cervical lymphadenopathy Cardiovascular: Normal rate, regular rhythm and normal heart sounds.   No murmur heard. No carotid bruit .  No edema Pulmonary/Chest: Effort normal and breath sounds normal. No respiratory distress. No has no wheezes. No rales.  Skin: Skin is warm and dry. Not diaphoretic.  Psychiatric: Normal mood and affect. Behavior is normal.      Assessment & Plan:    See Problem List for Assessment and Plan of chronic medical problems.    This visit occurred during the SARS-CoV-2 public health emergency.  Safety protocols were in place, including screening questions prior to the visit, additional usage of staff PPE, and extensive cleaning of exam room while observing appropriate contact time as indicated for disinfecting solutions.

## 2021-03-20 ENCOUNTER — Encounter: Payer: Self-pay | Admitting: Internal Medicine

## 2021-03-20 ENCOUNTER — Other Ambulatory Visit: Payer: Self-pay

## 2021-03-20 ENCOUNTER — Ambulatory Visit (INDEPENDENT_AMBULATORY_CARE_PROVIDER_SITE_OTHER): Payer: PPO | Admitting: Internal Medicine

## 2021-03-20 VITALS — BP 130/70 | HR 65 | Temp 98.8°F | Ht 59.0 in | Wt 136.2 lb

## 2021-03-20 DIAGNOSIS — R7303 Prediabetes: Secondary | ICD-10-CM

## 2021-03-20 DIAGNOSIS — E063 Autoimmune thyroiditis: Secondary | ICD-10-CM

## 2021-03-20 DIAGNOSIS — E038 Other specified hypothyroidism: Secondary | ICD-10-CM | POA: Diagnosis not present

## 2021-03-20 DIAGNOSIS — M353 Polymyalgia rheumatica: Secondary | ICD-10-CM | POA: Diagnosis not present

## 2021-03-20 DIAGNOSIS — K219 Gastro-esophageal reflux disease without esophagitis: Secondary | ICD-10-CM

## 2021-03-20 DIAGNOSIS — M810 Age-related osteoporosis without current pathological fracture: Secondary | ICD-10-CM

## 2021-03-20 DIAGNOSIS — E7849 Other hyperlipidemia: Secondary | ICD-10-CM

## 2021-03-20 DIAGNOSIS — I10 Essential (primary) hypertension: Secondary | ICD-10-CM | POA: Diagnosis not present

## 2021-03-20 LAB — COMPREHENSIVE METABOLIC PANEL
ALT: 18 U/L (ref 0–35)
AST: 18 U/L (ref 0–37)
Albumin: 3.9 g/dL (ref 3.5–5.2)
Alkaline Phosphatase: 35 U/L — ABNORMAL LOW (ref 39–117)
BUN: 18 mg/dL (ref 6–23)
CO2: 33 mEq/L — ABNORMAL HIGH (ref 19–32)
Calcium: 9.7 mg/dL (ref 8.4–10.5)
Chloride: 100 mEq/L (ref 96–112)
Creatinine, Ser: 0.75 mg/dL (ref 0.40–1.20)
GFR: 74.38 mL/min (ref >60.00)
Glucose, Bld: 90 mg/dL (ref 70–99)
Potassium: 4.7 mEq/L (ref 3.5–5.1)
Sodium: 140 mEq/L (ref 135–145)
Total Bilirubin: 1 mg/dL (ref 0.2–1.2)
Total Protein: 7.1 g/dL (ref 6.0–8.3)

## 2021-03-20 LAB — HEMOGLOBIN A1C: Hgb A1c MFr Bld: 6 % (ref 4.6–6.5)

## 2021-03-20 LAB — SEDIMENTATION RATE: Sed Rate: 14 mm/hr (ref 0–30)

## 2021-03-20 LAB — VITAMIN D 25 HYDROXY (VIT D DEFICIENCY, FRACTURES): VITD: 42.99 ng/mL (ref 30.00–100.00)

## 2021-03-20 NOTE — Assessment & Plan Note (Signed)
Subacute Following with Dr. Benjamine Mola Currently controlled on prednisone 10 mg daily unfortunately-she has follow-up with Dr. Benjamine Mola in a few days and most likely changes will need to be made Will check ESR

## 2021-03-20 NOTE — Assessment & Plan Note (Signed)
Chronic Check a1c Low sugar / carb diet Stressed regular exercise  

## 2021-03-20 NOTE — Assessment & Plan Note (Signed)
Chronic Clinically euthyroid Continue levothyroxine 50 mcg daily

## 2021-03-20 NOTE — Assessment & Plan Note (Signed)
Chronic Restart pravastatin - stopped on onset of myalgias which his now PMR Regular exercise and healthy diet encouraged

## 2021-03-20 NOTE — Assessment & Plan Note (Signed)
Chronic prolia q 6 months Next prolia due in October 2022 We need to continue prolia q 6 months for treatment of osteoporosis - need to now - currently on steroids for PMR

## 2021-03-20 NOTE — Assessment & Plan Note (Signed)
Chronic GERD controlled Continue omeprazole 20 mg every other day, famotidine 20 mg daily

## 2021-03-20 NOTE — Assessment & Plan Note (Signed)
Chronic BP well controlled Continue ziac 5-6.25 mg qd cmp

## 2021-03-21 ENCOUNTER — Other Ambulatory Visit: Payer: Self-pay | Admitting: Internal Medicine

## 2021-03-24 NOTE — Progress Notes (Signed)
Office Visit Note  Patient: [name] [name]             Date of Birth: 08/31/1940           MRN: [id number]             PCP: Binnie Rail, MD Referring: Binnie Rail, MD Visit Date: 03/25/2021   Subjective:   History of Present Illness: [name] [name] is a 80 y.o. female here for follow up for possible PMR symptoms and with trial of prednisone 10 mg daily and labs at initial visit showed high CRP, no specific ENAs, and were negative for CK elevation or HMGR Abs. Since our visit she feels not a great improvement with the low dose prednisone, previously had a large improvement on higher short term dose. Bilateral shoulders are the worst affected area and right worse than left. She notices severe symptoms every morning with improvement throughout each day.  Previous HPI 03/08/21 [name] [name] is a 80 y.o. female here for positive ANA and body aches in multiple sites. Symptoms developed following some URI type symptoms that resolved quickly but with ongoing joint and muscles aches including bilateral shoulders. She experienced symptoms improvement with oral prednisone quickly within about 2 days of starting that medication.  She denies ever having similar symptoms in the past.  She has not noticed any particular swelling redness or warmth in the involved areas.  There is very prolonged morning stiffness sometimes lasting throughout the day.  She was previously concerned with muscle aches and weakness could represent statin induced myopathy so stopped taking the statin medication and did not notice any difference in symptoms. She denies any new rashes, oral ulcers, persistent lymphadenopathy, weight loss, Raynaud's symptoms, or history of blood clots. She reports thinks there was some type of imaging of her shoulders obtained in Dr. Eilleen Kempf office that was not available for review.   Labs reviewed ANA 1:320 DFS Lyme neg CCP neg RF neg ESR 27 CRP neg LFTs wnl CBC WBC 11.0 TSH 5.34 Urinalysis  trace Hgb CK 44  Review of Systems  Constitutional:  Positive for fatigue.  HENT:  Negative for mouth sores, mouth dryness and nose dryness.   Eyes:  Negative for pain, itching, visual disturbance and dryness.  Respiratory:  Negative for cough, hemoptysis, shortness of breath and difficulty breathing.   Cardiovascular:  Positive for palpitations. Negative for chest pain and swelling in legs/feet.  Gastrointestinal:  Negative for abdominal pain, blood in stool, constipation and diarrhea.  Endocrine: Negative for increased urination.  Genitourinary:  Negative for painful urination.  Musculoskeletal:  Positive for joint pain, joint pain and morning stiffness. Negative for joint swelling, myalgias, muscle weakness, muscle tenderness and myalgias.  Skin:  Negative for color change, rash and redness.  Allergic/Immunologic: Negative for susceptible to infections.  Neurological:  Negative for dizziness, numbness, headaches, memory loss and weakness.  Hematological:  Negative for swollen glands.  Psychiatric/Behavioral:  Negative for confusion and sleep disturbance.    PMFS History:  Patient Active Problem List   Diagnosis Date Noted   PMR (polymyalgia rheumatica) (HCC) - Dr Benjamine Mola 03/19/2021   Bilateral shoulder pain 03/08/2021   Bilateral hip pain 03/08/2021   Positive ANA (antinuclear antibody) 03/08/2021   Myalgia 01/20/2021   Chronic dermatitis 01/17/2021   Urinary incontinence 01/17/2021   Hypertensive disorder 01/17/2021   Aortic atherosclerosis ([place]) 09/17/2020   Lower back pain 11/07/2019   Hearing loss of both ears due to cerumen impaction 07/05/2019  Closed compression fracture of lumbar vertebra ([place]), T11, T12, L1 12/30/2017   Lung nodules, stable, no f/u needed 12/30/2017   Hypothyroidism 11/01/2016   Constipation 04/30/2016   Prediabetes 11/01/2015   Spinal stenosis of lumbar region 10/03/2015   DDD (degenerative disc disease), lumbar    DJD (degenerative joint disease)  of knee    Hypertension 11/25/2010   History of colon polyps 05/14/2009   Arthralgia 02/06/2009   G E R D 08/17/2007   Hyperlipidemia 08/16/2007   HIATAL HERNIA 08/16/2007   DIVERTICULAR DISEASE 08/16/2007   IBS 08/16/2007   Osteoporosis on prolia 08/16/2007    Past Medical History:  Diagnosis Date   Benign neoplasm of colon    Cataract    bilateral,had surgery   DDD (degenerative disc disease), lumbar    MRI w. [facility] 05/2015, s/p ESI x 1 w/ improvement   Diaphragmatic hernia without mention of obstruction or gangrene    Diverticulosis of colon (without mention of hemorrhage)    DJD (degenerative joint disease) of knee    L>R, IA cortisone by [facility] in L 03/2015   Esophageal reflux    Gallstones    History of chicken pox    Hyperlipidemia    Hypertension    Irritable bowel syndrome    Migraines    Obesity, unspecified    Osteoporosis, unspecified    Tachycardia    Unspecified hemorrhoids with other complication     Family History  Problem Relation Age of Onset   Stroke Mother    Dementia Mother    Stroke Father    Heart disease Sister        x 2   Colon cancer Sister    Stroke Sister 66   Heart disease Brother    Past Surgical History:  Procedure Laterality Date   APPENDECTOMY  1979   CATARACT EXTRACTION     CHOLECYSTECTOMY  1989   COLONOSCOPY     POLYPECTOMY     VAGINAL HYSTERECTOMY  1996   Social History   Social History Narrative   Exercise: no regular exercise   Immunization History  Administered Date(s) Administered   Fluad Quad(high Dose 65+) 05/12/2019, 06/11/2020   Influenza Split 05/28/2011, 06/23/2012   Influenza Whole 05/25/2007, 05/31/2010   Influenza, High Dose Seasonal PF 06/27/2015, 04/30/2016, 05/01/2017, 05/04/2018   Influenza,inj,Quad PF,6+ Mos 04/20/2013, 06/13/2014   Moderna SARS-COV2 Booster Vaccination 07/05/2020, 12/12/2020   Moderna Sars-Covid-2 Vaccination 08/08/2019, 09/05/2019   Pneumococcal Conjugate-13 10/31/2014    Pneumococcal Polysaccharide-23 10/07/2013   Td 10/07/2013   Zoster, Live 10/31/2014     Objective: Vital Signs: BP 118/67 (BP Location: Left Arm, Patient Position: Sitting, Cuff Size: Normal)   Pulse 61   Ht _0  (1.499 m)   Wt 137 lb 9.6 oz (62.4 kg)   BMI 27.79 kg/m    Physical Exam Skin:    General: Skin is warm and dry.     Findings: No rash.  Neurological:     Mental Status: She is alert.     Comments: Unable to actively abduct overhead but strength below horizontal 5 out of 5 Hip flexion strength and range of motion normal  Psychiatric:        Mood and Affect: Mood normal.     Musculoskeletal Exam:  Shoulders decreased active abduction range of motion but preserved passive range of motion bilaterally, right shoulder held in a more anterior position than left Elbows full ROM no tenderness or swelling Wrists full ROM no tenderness  or swelling Fingers full ROM no tenderness or swelling Knees full ROM no tenderness or swelling    Investigation: No additional findings.  Imaging: XR Shoulder Left  Result Date: 03/25/2021 X-ray left shoulder 4 views Glenohumeral joint space appears intact some sclerosis under the glenoid surface.  Abnormal calcification toward lateral margin of joint best seen in externally rotated view.  AC joint appears normal. Impression Suspect calcific tendinopathy, mild glenohumeral joint osteoarthritis change  XR Shoulder Right  Result Date: 03/25/2021 X-ray right shoulder 4 views Glenohumeral joint space appears intact with good internal and external rotation seen.  No calcifications or abnormal bone mineralization seen.  Significant AC joint arthritis and relative demineralization of the distal clavicle and end of the acromion process.  No soft tissue calcifications are seen. Impression AC joint degenerative arthritis and distal clavicle changes consistent with history of remote fracture no significant bony arthritis of the and humeral joint  seen.   Recent Labs: Lab Results  Component Value Date   WBC 11.0 (H) 01/17/2021   HGB 13.7 01/17/2021   PLT 270.0 01/17/2021   NA 140 03/20/2021   K 4.7 03/20/2021   CL 100 03/20/2021   CO2 33 (H) 03/20/2021   GLUCOSE 90 03/20/2021   BUN 18 03/20/2021   CREATININE 0.75 03/20/2021   BILITOT 1.0 03/20/2021   ALKPHOS 35 (L) 03/20/2021   AST 18 03/20/2021   ALT 18 03/20/2021   PROT 7.1 03/20/2021   ALBUMIN 3.9 03/20/2021   CALCIUM 9.7 03/20/2021   GFRAA 87 03/12/2020    Speciality Comments: No specialty comments available.  Procedures:  Large Joint Inj: bilateral subacromial bursa on 03/25/2021 12:06 PM Indications: pain Details: 25 G 1.5 in needle, lateral approach Medications (Right): 3 mL lidocaine 1 %; 40 mg triamcinolone acetonide 40 MG/ML Medications (Left): 3 mL lidocaine 1 %; 40 mg triamcinolone acetonide 40 MG/ML Consent was given by the patient. Immediately prior to procedure a time out was called to verify the correct patient, procedure, equipment, support staff and site/side marked as required. Patient was prepped and draped in the usual sterile fashion.    Allergies: Patient has no known allergies.   Assessment / Plan:     Visit Diagnoses: PMR (polymyalgia rheumatica) (HCC) - Dr Benjamine Mola  Chronic pain of both shoulders - Plan: XR Shoulder Right, XR Shoulder Left, predniSONE (DELTASONE) 10 MG tablet, Large Joint Inj: bilateral subacromial bursa  Symptoms remain consistent with polymyalgia although lack of shoulder response to 10 mg prednisone possibly dose is too low versus alternate causes.  Bilateral shoulder x-rays checked without substantial osteoarthritis some evidence for calcific tendinitis in the left side.  Discussed side effects with long-term corticosteroid use particularly worsening of her existing osteoporosis would like to limit this.  Recommend continuing the 10 mg dose for another month.  Bilateral shoulder steroid injections performed today hopefully to  improve local symptoms without as much increased systemic exposure. Plan to f/u in 1 month and if doing well can start trial of tapering.  Bilateral hip pain  Lower extremity symptoms seem very well improved at this time this does seem consistent with PMR.  Possible that the shoulder symptoms are less responsive due to more pre-existing condition at the sites as calcific tendinitis and chronic changes from past clavicle fracture existed before these new symptoms.   Orders: Orders Placed This Encounter  Procedures   Large Joint Inj: bilateral subacromial bursa   XR Shoulder Right   XR Shoulder Left    Meds ordered  this encounter  Medications   predniSONE (DELTASONE) 10 MG tablet    Sig: Take 1 tablet (10 mg total) by mouth daily with breakfast.    Dispense:  30 tablet    Refill:  0      Follow-Up Instructions: Return in about 4 weeks (around 04/22/2021) for PMR on GC f/u 4wks.   Collier Salina, MD  Note - This record has been created using [employer].  Chart creation errors have been sought, but may not always  have been located. Such creation errors do not reflect on  the standard of medical care.

## 2021-03-25 ENCOUNTER — Ambulatory Visit: Payer: Self-pay

## 2021-03-25 ENCOUNTER — Other Ambulatory Visit: Payer: Self-pay

## 2021-03-25 ENCOUNTER — Ambulatory Visit: Payer: PPO | Admitting: Internal Medicine

## 2021-03-25 ENCOUNTER — Encounter: Payer: Self-pay | Admitting: Internal Medicine

## 2021-03-25 VITALS — BP 118/67 | HR 61 | Ht 59.0 in | Wt 137.6 lb

## 2021-03-25 DIAGNOSIS — M25551 Pain in right hip: Secondary | ICD-10-CM

## 2021-03-25 DIAGNOSIS — M25512 Pain in left shoulder: Secondary | ICD-10-CM

## 2021-03-25 DIAGNOSIS — M791 Myalgia, unspecified site: Secondary | ICD-10-CM

## 2021-03-25 DIAGNOSIS — G8929 Other chronic pain: Secondary | ICD-10-CM | POA: Diagnosis not present

## 2021-03-25 DIAGNOSIS — M25552 Pain in left hip: Secondary | ICD-10-CM

## 2021-03-25 DIAGNOSIS — M25511 Pain in right shoulder: Secondary | ICD-10-CM

## 2021-03-25 DIAGNOSIS — M353 Polymyalgia rheumatica: Secondary | ICD-10-CM

## 2021-03-25 MED ORDER — LIDOCAINE HCL 1 % IJ SOLN
3.0000 mL | INTRAMUSCULAR | Status: AC | PRN
  Administered 2021-03-25: 3 mL

## 2021-03-25 MED ORDER — TRIAMCINOLONE ACETONIDE 40 MG/ML IJ SUSP
40.0000 mg | INTRAMUSCULAR | Status: AC | PRN
Start: 2021-03-25 — End: 2021-03-25
  Administered 2021-03-25: 40 mg via INTRA_ARTICULAR

## 2021-03-25 MED ORDER — PREDNISONE 10 MG PO TABS: 10.0000 mg | ORAL_TABLET | Freq: Every day | ORAL | 0 refills | Status: AC

## 2021-03-25 NOTE — Patient Instructions (Signed)
Joint Steroid Injection A joint steroid injection is a procedure to relieve swelling and pain in a joint. Steroids are medicines that reduce inflammation. In this procedure, your health care provider uses a syringe and a needle to inject a steroid medicine into a painful and inflamed joint. A pain-relieving medicine (anesthetic) may be injected along with the steroid. In some cases, your health care provider may use an imaging technique such as ultrasound or fluoroscopy toguide the injection. Joints that are often treated with steroid injections include the knee, shoulder, hip, and spine. These injections may also be used in the elbow, ankle, and joints of the hands or feet. You may have joint steroid injections as part of your treatment for inflammation caused by: Gout. Rheumatoid arthritis. Advanced wear-and-tear arthritis (osteoarthritis). Tendinitis. Bursitis. Joint steroid injections may be repeated, but having them too often can damage a joint or the skin over the joint. You should not have joint steroidinjections less than 6 weeks apart or more than four times a year. Tell a health care provider about: Any allergies you have. All medicines you are taking, including vitamins, herbs, eye drops, creams, and over-the-counter medicines. Any problems you or family members have had with anesthetic medicines. Any blood disorders you have. Any surgeries you have had. Any medical conditions you have. Whether you are pregnant or may be pregnant. What are the risks? Generally, this is a safe treatment. However, problems may occur, including: Infection. Bleeding. Allergic reactions to medicines. Damage to the joint or tissues around the joint. Thinning of skin or loss of skin color over the joint. Temporary flushing of the face or chest. Temporary increase in pain. Temporary increase in blood sugar. Failure to relieve inflammation or pain. What happens before the treatment? Medicines Ask  your health care provider about: Changing or stopping your regular medicines. This is especially important if you are taking diabetes medicines or blood thinners. Taking medicines such as aspirin and ibuprofen. These medicines can thin your blood. Do not take these medicines unless your health care provider tells you to take them. Taking over-the-counter medicines, vitamins, herbs, and supplements. General instructions You may have imaging tests of your joint. Ask your health care provider if you can drive yourself home after the procedure. What happens during the treatment?  Your health care provider will position you for the injection and locate the injection site over your joint. The skin over the joint will be cleaned with a germ-killing soap. Your health care provider may: Spray a numbing solution (topical anesthetic) over the injection site. Inject a local anesthetic under the skin above your joint. The needle will be placed through your skin into your joint. Your health care provider may use imaging to guide the needle to the right spot for the injection. If imaging is used, a special contrast dye may be injected to confirm that the needle is in the correct location. The steroid medicine will be injected into your joint. Anesthetic may be injected along with the steroid. This may be a medicine that relieves pain for a short time (short-acting anesthetic) or for a longer time (long-acting anesthetic). The needle will be removed, and an adhesive bandage (dressing) will be placed over the injection site. The procedure may vary among health care providers and hospitals. What can I expect after the treatment? You will be able to go home after the treatment. It is normal to feel slight flushing for a few days after the injection. After the treatment, it is common to   have an increase in joint pain after the anesthetic has worn off. This may happen about an hour after a short-acting anesthetic  or about 8 hours after a longer-acting anesthetic. You should begin to feel relief from joint pain and swelling after 24 to 48 hours. Contact your health care provider if you do not begin to feel relief after 2 days. Follow these instructions at home: Injection site care Leave the adhesive dressing over your injection site in place until your health care provider says you can remove it. Check your injection site every day for signs of infection. Check for: More redness, swelling, or pain. Fluid or blood. Warmth. Pus or a bad smell. Activity Return to your normal activities as told by your health care provider. Ask your health care provider what activities are safe for you. You may be asked to limit activities that put stress on the joint for a few days. Do joint exercises as told by your health care provider. Do not take baths, swim, or use a hot tub until your health care provider approves. Ask your health care provider if you may take showers. You may only be allowed to take sponge baths. Managing pain, stiffness, and swelling  If directed, put ice on the joint. To do this: Put ice in a plastic bag. Place a towel between your skin and the bag. Leave the ice on for 20 minutes, 2-3 times a day. Remove the ice if your skin turns bright red. This is very important. If you cannot feel pain, heat, or cold, you have a greater risk of damage to the area. Raise (elevate) your joint above the level of your heart when you are sitting or lying down.  General instructions Take over-the-counter and prescription medicines only as told by your health care provider. Do not use any products that contain nicotine or tobacco, such as cigarettes, e-cigarettes, and chewing tobacco. These can delay joint healing. If you need help quitting, ask your health care provider. If you have diabetes, be aware that your blood sugar may be slightly elevated for several days after the injection. Keep all follow-up visits.  This is important. Contact a health care provider if you have: Chills or a fever. Any signs of infection at your injection site. Increased pain or swelling or no relief after 2 days. Summary A joint steroid injection is a treatment to relieve pain and swelling in a joint. Steroids are medicines that reduce inflammation. Your health care provider may add an anesthetic along with the steroid. You may have joint steroid injections as part of your arthritis treatment. Joint steroid injections may be repeated, but having them too often can damage a joint or the skin over the joint. Contact your health care provider if you have a fever, chills, or signs of infection, or if you get no relief from joint pain or swelling. This information is not intended to replace advice given to you by your health care provider. Make sure you discuss any questions you have with your healthcare provider. Document Revised: 12/23/2019 Document Reviewed: 12/23/2019 Elsevier Patient Education  2022 [employer] Inc.  

## 2021-03-28 ENCOUNTER — Telehealth: Payer: Self-pay | Admitting: Pharmacist

## 2021-03-28 NOTE — Chronic Care Management (AMB) (Signed)
Chronic Care Management Pharmacy Assistant   Name: [name] [name]  MRN: [id number] DOB: 05-27-1941   Reason for Encounter: Disease State   Conditions to be addressed/monitored: HLD   Recent office visits:  03/20/21 Binnie Rail, MD (PCP) hypertension  No med changes  Recent consult visits:  03/25/21 Collier Salina, MD-Rheumatology PMR (polymyalgia rheumatica XR Shoulder Right, XR Shoulder Left, predniSONE (DELTASONE) 10 MG tablet, Large Joint Inj: bilateral subacromial bursa  03/08/21 Rice, Resa Miner, MD-Rheumatology (Chronic pain of both shoulders) No med changes  Hospital visits:  None in previous 6 months  Medications: Outpatient Encounter Medications as of 03/28/2021  Medication Sig   acetaminophen (TYLENOL) 650 MG CR tablet Take 650 mg by mouth every 8 (eight) hours as needed for pain.   Biotin 5 MG CAPS Taking daily   bisoprolol-hydrochlorothiazide (ZIAC) 5-6.25 MG tablet TAKE 1 TABLET BY MOUTH EVERY DAY   CALCIUM CITRATE-VITAMIN D PO Take 600 mg by mouth in the morning and at bedtime.   clobetasol ointment (TEMOVATE) [id number] % Apply 1 application topically 2 (two) times daily.   denosumab (PROLIA) 60 MG/ML SOLN injection Inject 60 mg into the skin every 6 (six) months. Administer in upper arm, thigh, or abdomen   famotidine (PEPCID) 20 MG tablet Take 20 mg by mouth daily.   ibuprofen (ADVIL,MOTRIN) 200 MG tablet Take 600 mg by mouth 3 (three) times daily. With meals   levothyroxine (SYNTHROID) 50 MCG tablet Take 1 tablet (50 mcg total) by mouth daily.   Multiple Vitamins-Minerals (CENTRUM SILVER PO) Take by mouth daily.   omeprazole (PRILOSEC) 20 MG capsule TAKE 1 CAPSULE BY MOUTH EVERY DAY (Patient taking differently: Take 20 mg by mouth every other day.)   polyethylene glycol (MIRALAX / GLYCOLAX) packet Take 17 g by mouth daily. 2 capfuls daily   pravastatin (PRAVACHOL) 40 MG tablet TAKE 1 TABLET BY MOUTH EVERY DAY   predniSONE (DELTASONE) 10 MG tablet Take 1  tablet (10 mg total) by mouth daily with breakfast.   PROLIA 60 MG/ML SOSY injection INJECT 60 MG INTO THE SKIN ONCE FOR 1 DOSE.   Wheat Dextrin (BENEFIBER) POWD Twice daily   No facility-administered encounter medications on file as of 03/28/2021.   03/28/2021 Name: [name] [name] MRN: [id number] DOB: [birth year] [name] [name] is a 80 y.o. year old female who is a primary care patient of Burns, Claudina Lick, MD.  Comprehensive medication review performed; Spoke to patient regarding cholesterol    Lipid Panel    Component Value Date/Time   CHOL 207 (H) 09/17/2020 1124   TRIG 176.0 (H) 09/17/2020 1124   TRIG 237 (HH) 08/10/2006 0922   HDL 55.70 09/17/2020 1124   LDLCALC 116 (H) 09/17/2020 1124   LDLCALC 138 (H) 03/12/2020 1145   LDLDIRECT 158.0 06/27/2015 1124    Contacted patient on 03/28/21 to discuss hyperlipidemia.  10-year ASCVD risk score: The ASCVD Risk score Mikey Bussing DC Jr., et al., 2013) failed to calculate for the following reasons:   The 2013 ASCVD risk score is only valid for ages 9 to 60  Current antihyperlipidemic regimen:  Pravastatin 40 mg 1 tab daily Previous antihyperlipidemic medications tried: none noted  ASCVD risk enhancing conditions: age >56  What recent interventions/DTPs have been made by any provider to improve Cholesterol control since last CPP Visit: none noted, patient stated that her Rheumatologist suggested she try Crestor, but she said the pain or achiness in legs and arms are doing better  Any recent hospitalizations  or ED visits since last visit with CPP? No  What diet changes have been made to improve Cholesterol?  Patient states that she has not made any changes in her diet, she does watch what she eats since she has been on prednisone  What exercise is being done to improve Cholesterol?  Patient states that she does not exercise as much as she wants but is very active around her home  Adherence Review: Does the patient have >5 day gap between last  estimated fill dates? No   CCM appointment on 06/17/21  Star Rating Drugs Medication Name/Dose: Last fill date Days supply  Pravastatin 40 mg  03/19/21  [facility] Pharmacist Assistant [phone]   Time spent:34

## 2021-04-03 ENCOUNTER — Other Ambulatory Visit: Payer: Self-pay

## 2021-04-03 ENCOUNTER — Ambulatory Visit
Admission: RE | Admit: 2021-04-03 | Discharge: 2021-04-03 | Disposition: A | Discharge status: Home / Self Care | Payer: PPO | Source: Ambulatory Visit | Attending: Internal Medicine | Admitting: Internal Medicine

## 2021-04-03 DIAGNOSIS — Z1231 Encounter for screening mammogram for malignant neoplasm of breast: Secondary | ICD-10-CM | POA: Diagnosis not present

## 2021-04-21 ENCOUNTER — Other Ambulatory Visit: Payer: Self-pay | Admitting: Internal Medicine

## 2021-04-21 DIAGNOSIS — M791 Myalgia, unspecified site: Secondary | ICD-10-CM

## 2021-04-21 DIAGNOSIS — G8929 Other chronic pain: Secondary | ICD-10-CM

## 2021-04-21 DIAGNOSIS — M25551 Pain in right hip: Secondary | ICD-10-CM

## 2021-04-21 DIAGNOSIS — M25511 Pain in right shoulder: Secondary | ICD-10-CM

## 2021-04-23 ENCOUNTER — Telehealth: Payer: Self-pay

## 2021-04-23 NOTE — Telephone Encounter (Signed)
Prolia VOB initiated via [email or web address]  Last OV:  Next OV:  Last Prolia inj: 10/29/20 Next Prolia inj DUE: 05/01/21

## 2021-04-23 NOTE — Progress Notes (Signed)
Office Visit Note  Patient: [name] [name]             Date of Birth: [birth year]           MRN: [id number]             PCP: Binnie Rail, MD Referring: Binnie Rail, MD Visit Date: 04/24/2021   Subjective:  Other (Patient reports improvement since recent injections. Patient does report weakness in her knees. )   History of Present Illness: [name] [name] is a 80 y.o. female here for follow up with possible PMR versus b/l shoulder bursitis on continued trial of prednisone 10 mg daily and bilateral subacromial bursa injection last month. She feels a large improvement in shoulder pain and stiffness after the injections. She continues having problems in her legs knees feel weaker than usual. She has not fallen and does not describe any loss of feeling in her legs.  Previous HPI 03/25/21 [name] [name] is a 80 y.o. female here for follow up for possible PMR symptoms and with trial of prednisone 10 mg daily and labs at initial visit showed high CRP, no specific ENAs, and were negative for CK elevation or HMGR Abs. Since our visit she feels not a great improvement with the low dose prednisone, previously had a large improvement on higher short term dose. Bilateral shoulders are the worst affected area and right worse than left. She notices severe symptoms every morning with improvement throughout each day.  03/08/21 [name] [name] is a 80 y.o. female here for positive ANA and body aches in multiple sites. Symptoms developed following some URI type symptoms that resolved quickly but with ongoing joint and muscles aches including bilateral shoulders. She experienced symptoms improvement with oral prednisone quickly within about 2 days of starting that medication.  She denies ever having similar symptoms in the past.  She has not noticed any particular swelling redness or warmth in the involved areas.  There is very prolonged morning stiffness sometimes lasting throughout the day.  She was previously concerned  with muscle aches and weakness could represent statin induced myopathy so stopped taking the statin medication and did not notice any difference in symptoms. She denies any new rashes, oral ulcers, persistent lymphadenopathy, weight loss, Raynaud's symptoms, or history of blood clots. She reports thinks there was some type of imaging of her shoulders obtained in Dr. Eilleen Kempf office that was not available for review.   Labs reviewed ANA 1:320 DFS Lyme neg RF neg CCP neg ESR 27 CRP neg  Review of Systems  Constitutional:  Negative for fatigue.  HENT:  Negative for mouth sores, mouth dryness and nose dryness.   Eyes:  Negative for pain, itching and dryness.  Respiratory:  Negative for shortness of breath and difficulty breathing.   Cardiovascular:  Negative for chest pain and palpitations.  Gastrointestinal:  Negative for blood in stool, constipation and diarrhea.  Endocrine: Negative for increased urination.  Genitourinary:  Negative for difficulty urinating.  Musculoskeletal:  Negative for joint pain, joint pain, joint swelling, myalgias, morning stiffness, muscle tenderness and myalgias.  Skin:  Negative for color change, rash and redness.  Allergic/Immunologic: Negative for susceptible to infections.  Neurological:  Negative for dizziness, numbness, headaches, memory loss and weakness.  Hematological:  Negative for bruising/bleeding tendency.  Psychiatric/Behavioral:  Negative for confusion.    PMFS History:  Patient Active Problem List   Diagnosis Date Noted   PMR (polymyalgia rheumatica) (HCC) - Dr Benjamine Mola 03/19/2021  Bilateral shoulder pain 03/08/2021   Bilateral hip pain 03/08/2021   Positive ANA (antinuclear antibody) 03/08/2021   Myalgia 01/20/2021   Chronic dermatitis 01/17/2021   Urinary incontinence 01/17/2021   Hypertensive disorder 01/17/2021   Aortic atherosclerosis ([place]) 09/17/2020   Lower back pain 11/07/2019   Hearing loss of both ears due to cerumen impaction  07/05/2019   Closed compression fracture of lumbar vertebra ([place]), T11, T12, L1 12/30/2017   Lung nodules, stable, no f/u needed 12/30/2017   Hypothyroidism 11/01/2016   Constipation 04/30/2016   Prediabetes 11/01/2015   Spinal stenosis of lumbar region 10/03/2015   DDD (degenerative disc disease), lumbar    DJD (degenerative joint disease) of knee    Hypertension 11/25/2010   History of colon polyps 05/14/2009   Arthralgia 02/06/2009   G E R D 08/17/2007   Hyperlipidemia 08/16/2007   HIATAL HERNIA 08/16/2007   DIVERTICULAR DISEASE 08/16/2007   IBS 08/16/2007   Osteoporosis on prolia 08/16/2007    Past Medical History:  Diagnosis Date   Benign neoplasm of colon    Cataract    bilateral,had surgery   DDD (degenerative disc disease), lumbar    MRI w. [facility] 05/2015, s/p ESI x 1 w/ improvement   Diaphragmatic hernia without mention of obstruction or gangrene    Diverticulosis of colon (without mention of hemorrhage)    DJD (degenerative joint disease) of knee    L>R, IA cortisone by [facility] in L 03/2015   Esophageal reflux    Gallstones    History of chicken pox    Hyperlipidemia    Hypertension    Irritable bowel syndrome    Migraines    Obesity, unspecified    Osteoporosis, unspecified    Tachycardia    Unspecified hemorrhoids with other complication     Family History  Problem Relation Age of Onset   Stroke Mother    Dementia Mother    Stroke Father    Heart disease Sister        x 2   Colon cancer Sister    Stroke Sister 46   Heart disease Brother    Past Surgical History:  Procedure Laterality Date   APPENDECTOMY  1979   CATARACT EXTRACTION     CHOLECYSTECTOMY  1989   COLONOSCOPY     POLYPECTOMY     VAGINAL HYSTERECTOMY  1996   Social History   Social History Narrative   Exercise: no regular exercise   Immunization History  Administered Date(s) Administered   Fluad Quad(high Dose 65+) 05/12/2019, 06/11/2020   Influenza Split 05/28/2011,  06/23/2012   Influenza Whole 05/25/2007, 05/31/2010   Influenza, High Dose Seasonal PF 06/27/2015, 04/30/2016, 05/01/2017, 05/04/2018   Influenza,inj,Quad PF,6+ Mos 04/20/2013, 06/13/2014   Moderna SARS-COV2 Booster Vaccination 07/05/2020, 12/12/2020   Moderna Sars-Covid-2 Vaccination 08/08/2019, 09/05/2019   Pneumococcal Conjugate-13 10/31/2014   Pneumococcal Polysaccharide-23 10/07/2013   Td 10/07/2013   Zoster, Live 10/31/2014     Objective: Vital Signs: BP (!) 146/71 (BP Location: Left Arm, Patient Position: Sitting, Cuff Size: Normal)   Pulse 69   Ht _0  (1.499 m)   Wt 139 lb (63 kg)   BMI 28.07 kg/m    Physical Exam Cardiovascular:     Rate and Rhythm: Normal rate and regular rhythm.  Pulmonary:     Effort: Pulmonary effort is normal.     Breath sounds: Normal breath sounds.  Musculoskeletal:     Right lower leg: No edema.     Left lower  leg: No edema.  Skin:    Findings: No rash.  Neurological:     General: No focal deficit present.     Mental Status: She is alert.     Deep Tendon Reflexes: Reflexes normal.  Psychiatric:        Mood and Affect: Mood normal.     Musculoskeletal Exam:  Shoulders full ROM no tenderness or swelling Elbows full ROM no tenderness or swelling Wrists full ROM no tenderness or swelling Fingers full ROM no tenderness or swelling Knees full ROM no tenderness or swelling    Investigation: No additional findings.  Imaging: No results found.  Recent Labs: Lab Results  Component Value Date   WBC 11.0 (H) 01/17/2021   HGB 13.7 01/17/2021   PLT 270.0 01/17/2021   NA 140 03/20/2021   K 4.7 03/20/2021   CL 100 03/20/2021   CO2 33 (H) 03/20/2021   GLUCOSE 90 03/20/2021   BUN 18 03/20/2021   CREATININE 0.75 03/20/2021   BILITOT 1.0 03/20/2021   ALKPHOS 35 (L) 03/20/2021   AST 18 03/20/2021   ALT 18 03/20/2021   PROT 7.1 03/20/2021   ALBUMIN 3.9 03/20/2021   CALCIUM 9.7 03/20/2021   GFRAA 87 03/12/2020    Speciality  Comments: No specialty comments available.  Procedures:  No procedures performed Allergies: Patient has no known allergies.   Assessment / Plan:     Visit Diagnoses: PMR (polymyalgia rheumatica) (HCC) - Dr Benjamine Mola Chronic pain of both shoulders  Cannot confirm for sure the diagnosis versus existing shoulder pathology with calcific tendonitis usually very chronic change. With her existing osteoporosis would try to minimize steroid exposure recommend decrease prednisone to 5 mg PO daily for 1 week then try quitting completely. If symptoms return significantly we can go back to the low dose possibly or take another look.  Positive ANA (antinuclear antibody)  No specific serology positive to indicate underlying chronic autoimmune cause for inflammation. Dense fine speckled pattern is common in positive ANA seen not in association with CTD.  Age related osteoporosis, unspecified pathological fracture presence  On prolia, appropriate treatment also for prevention of GIOP.  Orders: No orders of the defined types were placed in this encounter.  No orders of the defined types were placed in this encounter.    Follow-Up Instructions: Return if symptoms worsen or fail to improve.   Collier Salina, MD  Note - This record has been created using [employer].  Chart creation errors have been sought, but may not always  have been located. Such creation errors do not reflect on  the standard of medical care.

## 2021-04-24 ENCOUNTER — Ambulatory Visit: Payer: PPO | Admitting: Internal Medicine

## 2021-04-24 ENCOUNTER — Other Ambulatory Visit: Payer: Self-pay

## 2021-04-24 ENCOUNTER — Encounter: Payer: Self-pay | Admitting: Internal Medicine

## 2021-04-24 VITALS — BP 146/71 | HR 69 | Ht 59.0 in | Wt 139.0 lb

## 2021-04-24 DIAGNOSIS — M25512 Pain in left shoulder: Secondary | ICD-10-CM | POA: Diagnosis not present

## 2021-04-24 DIAGNOSIS — M810 Age-related osteoporosis without current pathological fracture: Secondary | ICD-10-CM | POA: Diagnosis not present

## 2021-04-24 DIAGNOSIS — R768 Other specified abnormal immunological findings in serum: Secondary | ICD-10-CM | POA: Diagnosis not present

## 2021-04-24 DIAGNOSIS — M353 Polymyalgia rheumatica: Secondary | ICD-10-CM | POA: Diagnosis not present

## 2021-04-24 DIAGNOSIS — M25511 Pain in right shoulder: Secondary | ICD-10-CM

## 2021-04-24 DIAGNOSIS — G8929 Other chronic pain: Secondary | ICD-10-CM

## 2021-04-24 NOTE — Telephone Encounter (Signed)
Next Visit: 04/24/2021  Last Visit: 03/25/2021  Last Fill: 03/25/2021  Dx: PMR (polymyalgia rheumatica)  Current Dose per office note on 03/25/2021: Recommend continuing the 10 mg dose for another month  Okay to refill Prednisone?

## 2021-04-30 ENCOUNTER — Telehealth: Payer: Self-pay

## 2021-04-30 NOTE — Telephone Encounter (Signed)
Patient has appointment for tomorrow

## 2021-05-01 ENCOUNTER — Other Ambulatory Visit: Payer: Self-pay

## 2021-05-01 ENCOUNTER — Ambulatory Visit (INDEPENDENT_AMBULATORY_CARE_PROVIDER_SITE_OTHER): Payer: PPO

## 2021-05-01 DIAGNOSIS — M810 Age-related osteoporosis without current pathological fracture: Secondary | ICD-10-CM

## 2021-05-01 MED ORDER — DENOSUMAB 60 MG/ML ~~LOC~~ SOSY
60.0000 mg | PREFILLED_SYRINGE | Freq: Once | SUBCUTANEOUS | Status: AC
  Administered 2021-05-01: 60 mg via SUBCUTANEOUS

## 2021-05-01 NOTE — Telephone Encounter (Signed)
Pt ready for scheduling on or after 05/01/21  Out-of-pocket cost due at time of visit: $280  Primary: [employer] Prolia co-insurance: 20% (approximately $255) Admin fee co-insurance: 20% (approximately $25)  Secondary: n/a Prolia co-insurance:  Admin fee co-insurance:   Deductible: does not apply  Prior Auth: not required PA# Valid:   ** This summary of benefits is an estimation of the patient's out-of-pocket cost. Exact cost may vary based on individual plan coverage.

## 2021-05-01 NOTE — Progress Notes (Signed)
Prolia Injection given w/o any complications.

## 2021-05-08 DIAGNOSIS — L821 Other seborrheic keratosis: Secondary | ICD-10-CM | POA: Diagnosis not present

## 2021-05-08 DIAGNOSIS — L720 Epidermal cyst: Secondary | ICD-10-CM | POA: Diagnosis not present

## 2021-05-08 DIAGNOSIS — D225 Melanocytic nevi of trunk: Secondary | ICD-10-CM | POA: Diagnosis not present

## 2021-05-08 DIAGNOSIS — L814 Other melanin hyperpigmentation: Secondary | ICD-10-CM | POA: Diagnosis not present

## 2021-05-09 NOTE — Telephone Encounter (Signed)
Last Prolia inj 05/01/21 Next Prolia inj due 10/31/21

## 2021-05-30 ENCOUNTER — Other Ambulatory Visit: Payer: Self-pay

## 2021-05-30 ENCOUNTER — Ambulatory Visit: Payer: PPO | Admitting: Internal Medicine

## 2021-05-30 ENCOUNTER — Encounter: Payer: Self-pay | Admitting: Internal Medicine

## 2021-05-30 VITALS — BP 130/79 | HR 63 | Ht 60.0 in | Wt 139.0 lb

## 2021-05-30 DIAGNOSIS — M353 Polymyalgia rheumatica: Secondary | ICD-10-CM

## 2021-05-30 DIAGNOSIS — R768 Other specified abnormal immunological findings in serum: Secondary | ICD-10-CM | POA: Diagnosis not present

## 2021-05-30 DIAGNOSIS — G8929 Other chronic pain: Secondary | ICD-10-CM | POA: Diagnosis not present

## 2021-05-30 DIAGNOSIS — M25552 Pain in left hip: Secondary | ICD-10-CM | POA: Diagnosis not present

## 2021-05-30 DIAGNOSIS — M25511 Pain in right shoulder: Secondary | ICD-10-CM

## 2021-05-30 DIAGNOSIS — M25512 Pain in left shoulder: Secondary | ICD-10-CM

## 2021-05-30 DIAGNOSIS — M25551 Pain in right hip: Secondary | ICD-10-CM

## 2021-05-30 NOTE — Progress Notes (Signed)
Office Visit Note  Patient: [name] [name]             Date of Birth: 08/31/1940           MRN: [id number]             PCP: Binnie Rail, MD Referring: Binnie Rail, MD Visit Date: 05/30/2021  Subjective:  Other (Patient reports bilateral shoulder pain. Patient states she was pain free for 1 month after the injection. )   History of Present Illness: [name] [name] is a 80 y.o. female here for follow-up of suspected possible PMR after discontinuing prednisone and bilateral shoulder injection at her last clinic visit she was doing extremely well.  She felt pain-free for 1 month with injections but has returned all the way back to similar severity as her original onset.  Pain is localized to the shoulders and hips does not extend distally into the extremities.  She is very stiff every morning with a partial improvement during the day.  04/24/21 [name] is a 80 y.o. female here for follow up with possible PMR versus b/l shoulder bursitis on continued trial of prednisone 10 mg daily and bilateral subacromial bursa injection last month. She feels a large improvement in shoulder pain and stiffness after the injections. She continues having problems in her legs knees feel weaker than usual. She has not fallen and does not describe any loss of feeling in her legs.    03/08/21 [name] is a 80 y.o. female here for positive ANA and body aches in multiple sites. Symptoms developed following some URI type symptoms that resolved quickly but with ongoing joint and muscles aches including bilateral shoulders. She experienced symptoms improvement with oral prednisone quickly within about 2 days of starting that medication.  She denies ever having similar symptoms in the past.  She has not noticed any particular swelling redness or warmth in the involved areas.  There is very prolonged morning stiffness sometimes lasting throughout the day.  She was previously concerned with muscle aches and weakness could  represent statin induced myopathy so stopped taking the statin medication and did not notice any difference in symptoms. She denies any new rashes, oral ulcers, persistent lymphadenopathy, weight loss, Raynaud's symptoms, or history of blood clots. She reports thinks there was some type of imaging of her shoulders obtained in Dr. Eilleen Kempf office that was not available for review.   Labs reviewed ANA 1:320 DFS Lyme neg RF neg CCP neg ESR 27 CRP neg   Review of Systems  Constitutional:  Positive for fatigue.  HENT:  Negative for mouth sores, mouth dryness and nose dryness.   Eyes:  Negative for pain, itching and dryness.  Respiratory:  Negative for shortness of breath and difficulty breathing.   Cardiovascular:  Negative for chest pain and palpitations.  Gastrointestinal:  Negative for blood in stool, constipation and diarrhea.  Endocrine: Negative for increased urination.  Genitourinary:  Negative for difficulty urinating.  Musculoskeletal:  Positive for joint pain, joint pain, myalgias, morning stiffness, muscle tenderness and myalgias. Negative for joint swelling.  Skin:  Negative for color change, rash and redness.  Allergic/Immunologic: Negative for susceptible to infections.  Neurological:  Positive for memory loss. Negative for dizziness, numbness, headaches and weakness.  Hematological:  Negative for bruising/bleeding tendency.  Psychiatric/Behavioral:  Negative for confusion.    PMFS History:  Patient Active Problem List   Diagnosis Date Noted   PMR (polymyalgia rheumatica) (HCC) - Dr Benjamine Mola  03/19/2021   Bilateral shoulder pain 03/08/2021   Bilateral hip pain 03/08/2021   Positive ANA (antinuclear antibody) 03/08/2021   Myalgia 01/20/2021   Chronic dermatitis 01/17/2021   Urinary incontinence 01/17/2021   Hypertensive disorder 01/17/2021   Aortic atherosclerosis ([place]) 09/17/2020   Lower back pain 11/07/2019   Hearing loss of both ears due to cerumen impaction 07/05/2019    Closed compression fracture of lumbar vertebra ([place]), T11, T12, L1 12/30/2017   Lung nodules, stable, no f/u needed 12/30/2017   Hypothyroidism 11/01/2016   Constipation 04/30/2016   Prediabetes 11/01/2015   Spinal stenosis of lumbar region 10/03/2015   DDD (degenerative disc disease), lumbar    DJD (degenerative joint disease) of knee    Hypertension 11/25/2010   History of colon polyps 05/14/2009   Arthralgia 02/06/2009   G E R D 08/17/2007   Hyperlipidemia 08/16/2007   HIATAL HERNIA 08/16/2007   DIVERTICULAR DISEASE 08/16/2007   IBS 08/16/2007   Osteoporosis on prolia 08/16/2007    Past Medical History:  Diagnosis Date   Benign neoplasm of colon    Cataract    bilateral,had surgery   DDD (degenerative disc disease), lumbar    MRI w. [facility] 05/2015, s/p ESI x 1 w/ improvement   Diaphragmatic hernia without mention of obstruction or gangrene    Diverticulosis of colon (without mention of hemorrhage)    DJD (degenerative joint disease) of knee    L>R, IA cortisone by [facility] in L 03/2015   Esophageal reflux    Gallstones    History of chicken pox    Hyperlipidemia    Hypertension    Irritable bowel syndrome    Migraines    Obesity, unspecified    Osteoporosis, unspecified    Tachycardia    Unspecified hemorrhoids with other complication     Family History  Problem Relation Age of Onset   Stroke Mother    Dementia Mother    Stroke Father    Heart disease Sister        x 2   Colon cancer Sister    Stroke Sister 27   Heart disease Brother    Past Surgical History:  Procedure Laterality Date   APPENDECTOMY  1979   CATARACT EXTRACTION     CHOLECYSTECTOMY  1989   COLONOSCOPY     POLYPECTOMY     VAGINAL HYSTERECTOMY  1996   Social History   Social History Narrative   Exercise: no regular exercise   Immunization History  Administered Date(s) Administered   Fluad Quad(high Dose 65+) 05/12/2019, 06/11/2020   Influenza Split 05/28/2011, 06/23/2012    Influenza Whole 05/25/2007, 05/31/2010   Influenza, High Dose Seasonal PF 06/27/2015, 04/30/2016, 05/01/2017, 05/04/2018   Influenza,inj,Quad PF,6+ Mos 04/20/2013, 06/13/2014   Moderna SARS-COV2 Booster Vaccination 07/05/2020, 12/12/2020   Moderna Sars-Covid-2 Vaccination 08/08/2019, 09/05/2019   Pneumococcal Conjugate-13 10/31/2014   Pneumococcal Polysaccharide-23 10/07/2013   Td 10/07/2013   Zoster, Live 10/31/2014     Objective: Vital Signs: BP 130/79 (BP Location: Left Arm, Patient Position: Sitting, Cuff Size: Normal)   Pulse 63   Ht 5' (1.524 m)   Wt 139 lb (63 kg)   BMI 27.15 kg/m    Physical Exam Cardiovascular:     Rate and Rhythm: Normal rate and regular rhythm.  Pulmonary:     Effort: Pulmonary effort is normal.     Breath sounds: Normal breath sounds.  Skin:    General: Skin is warm and dry.  Neurological:  Mental Status: She is alert.  Psychiatric:        Mood and Affect: Mood normal.     Musculoskeletal Exam:  Shoulders bilaterally decreased active abduction range of motion preserved passively but with pain Bilateral hip pain with resisted flexion strength intact with normal reflexes   Investigation: No additional findings.  Imaging: No results found.  Recent Labs: Lab Results  Component Value Date   WBC 11.0 (H) 01/17/2021   HGB 13.7 01/17/2021   PLT 270.0 01/17/2021   NA 140 03/20/2021   K 4.7 03/20/2021   CL 100 03/20/2021   CO2 33 (H) 03/20/2021   GLUCOSE 90 03/20/2021   BUN 18 03/20/2021   CREATININE 0.75 03/20/2021   BILITOT 1.0 03/20/2021   ALKPHOS 35 (L) 03/20/2021   AST 18 03/20/2021   ALT 18 03/20/2021   PROT 7.1 03/20/2021   ALBUMIN 3.9 03/20/2021   CALCIUM 9.7 03/20/2021   GFRAA 87 03/12/2020    Speciality Comments: No specialty comments available.  Procedures:  No procedures performed Allergies: Patient has no known allergies.   Assessment / Plan:     Visit Diagnoses: PMR (polymyalgia rheumatica) (HCC) - Dr Benjamine Mola -  Plan: Sedimentation rate, C-reactive protein  Symptoms do appear pretty consistent for PMR in which case prednisone taper was too quick.  Although she did not describe a very complete resolution of symptoms on the oral steroids it did cause normalization of inflammatory markers.  We will recheck ESR and CRP today if elevated again off treatment would be consistent with PMR and will probably recommend a more gradual prednisone dose reduction.  Positive ANA (antinuclear antibody)  No clinical features for systemic connective tissue disease again today joint pains are limited to proximal involvement seems less likely for a seronegative arthritis.  Chronic pain of both shoulders Bilateral hip pain - Plan: Sedimentation rate, C-reactive protein  Proximal symmetrical bilateral joint pains worse with early morning improving with use primarily and active range of motion all seems more consistent with inflammatory changes checking labs as above.  Unfortunately not a very durable benefit to local steroid injection more suggestive for ongoing systemic problem than simple chronic arthropathy or bursitis.  Orders: Orders Placed This Encounter  Procedures   Sedimentation rate   C-reactive protein   Meds ordered this encounter  Medications   predniSONE (DELTASONE) 5 MG tablet    Sig: Take 2 tablets (10 mg total) by mouth daily with breakfast for 14 days, THEN 1.5 tablets (7.5 mg total) daily with breakfast for 14 days, THEN 1 tablet (5 mg total) daily with breakfast.    Dispense:  80 tablet    Refill:  0    Follow-Up Instructions: No follow-ups on file.   Collier Salina, MD  Note - This record has been created using [employer].  Chart creation errors have been sought, but may not always  have been located. Such creation errors do not reflect on  the standard of medical care.

## 2021-05-30 NOTE — Patient Instructions (Addendum)
A Polymyalgia Rheumatica Polymyalgia rheumatica (PMR) is an inflammatory disorder that causes the muscles and joints to ache and become stiff. Sometimes, PMR leads to a more dangerous condition that can cause vision loss (temporal arteritis or giant cell arteritis). What are the causes? The exact cause of PMR is not known. What increases the risk? You are more likely to develop this condition if you are: Female. 82 years of age or older. Caucasian. What are the signs or symptoms? Pain and stiffness are the main symptoms of PMR. Symptoms may: Be worse after inactivity and in the morning. Affect your: Hips, buttocks, and thighs. Neck, arms, and shoulders. This can make it hard to raise your arms above your head. Hands and wrists. Other symptoms include: Fever. Tiredness. Weakness. Depression. Decreased appetite. This may lead to weight loss. Symptoms may start slowly or suddenly. How is this diagnosed? This condition is diagnosed with your medical history and a physical exam. You may need to see a health care provider who specializes in diseases of the joints, muscles, and bones (rheumatologist). You may also have tests, including: Blood tests. X-rays. Ultrasound. How is this treated? PMR usually goes away without treatment, but it may take years. Your health care provider may recommend low-dose steroids and other medicines to help manage your symptoms of pain and stiffness. Regular exercise and rest will also help your symptoms. Follow these instructions at home:  Take over-the-counter and prescription medicines only as told by your health care provider. Make sure to get enough rest and sleep. Eat a healthy and nutritious diet. Try to exercise most days of the week. Ask your health care provider what type of exercise is best for you. Keep all follow-up visits as told by your health care provider. This is important. Contact a health care provider if: Your symptoms do not improve  with medicine. You have side effects from steroids. These may include: Weight gain. Swelling. Insomnia. Mood changes. Bruising. High blood sugar readings, if you have diabetes. Higher than normal blood pressure readings, if you monitor your blood pressure. Get help right away if: You develop symptoms of temporal arteritis, such as: A change in vision. Severe headache. Scalp pain. Jaw pain. Summary Polymyalgia rheumatica is an inflammatory disorder that causes aching and stiffness in your muscles and joints. The exact cause of this condition is not known. This condition usually goes away without treatment. Your health care provider may give you low-dose steroids to help manage your pain and stiffness. Rest and regular exercise will help the symptoms.  C-Reactive Protein Test Why am I having this test? The C-reactive protein (CRP) is a substance that the liver releases in response to inflammation within the body. You may have a CRP test to help diagnose: Serious bacterial or fungal infections. Inflammatory diseases, such as inflammatory bowel disease. Lupus, rheumatoid arthritis, or other autoimmune diseases. What is being tested? This test checks for the level of CRP in your blood. The level of CRP in your body increases greatly after a heart attack, infection, or injury. What kind of sample is taken? A blood sample is required for this test. It is usually collected by inserting a needle into a blood vessel. Tell a health care provider about: All medicines you are taking, including vitamins, herbs, eye drops, creams, and over-the-counter medicines. Any medical conditions you have. The use of birth control pills or hormone replacement therapy such as estrogen. Any blood disorders you have. Whether you are pregnant or may be pregnant. How are  the results reported? Your test results will be reported as a value that indicates how much CRP is in your blood. This will be reported as  milligrams of CRP per liter (mg/L) of blood. Your health care provider will compare your results to normal ranges that were established after testing a large group of people (reference ranges). Reference ranges may vary among labs and hospitals. For this test, the standard CRP reference value is less than 10 mg/L. What do the results mean? A standard CRP test result that is less than 10 mg/L is considered normal, meaning that you do not have an abnormally high level of inflammation in your body. A standard CRP test result that is greater than 10 mg/L means that there is an abnormally high level of inflammation in your body that is causing CRP to be released. Inflammation may result from many different conditions or injuries. You will have more tests to help make a diagnosis. Talk with your health care provider about what your results mean. Questions to ask your health care provider Ask your health care provider, or the department that is doing the test: When will my results be ready? How will I get my results? What are my treatment options? What other tests do I need? What are my next steps? Summary C-reactive protein (CPR) is a substance released by the liver in response to inflammation within the body. The CRP test may be performed to help diagnose infection and inflammatory conditions. Talk with your health care provider about what your results mean.

## 2021-05-31 ENCOUNTER — Telehealth: Payer: Self-pay | Admitting: Internal Medicine

## 2021-05-31 LAB — C-REACTIVE PROTEIN: CRP: 15.1 mg/L — ABNORMAL HIGH (ref <8.0)

## 2021-05-31 LAB — SEDIMENTATION RATE: Sed Rate: 39 mm/h — ABNORMAL HIGH (ref 0–30)

## 2021-05-31 NOTE — Telephone Encounter (Signed)
Please see lab note for details.

## 2021-05-31 NOTE — Progress Notes (Signed)
Left voicemail trying to call patient. Labs show elevated inflammatory markers after stopping prednisone, this looks very consistent with PMR. I would recommend she return to taking prednisone at a 10 mg daily dose and then more slowly down to 7.5 mg then 5mg  after no less than 14 days at each dose and not stop altogether.  The 10 mg dose previously brought her lab results down to normal range so should help. Even though she reports only partial benefit before.

## 2021-05-31 NOTE — Telephone Encounter (Signed)
Patient left a message she was returning Dr. Marveen Reeks call?. Please call when available.

## 2021-06-03 MED ORDER — PREDNISONE 5 MG PO TABS: ORAL_TABLET | ORAL | 0 refills | Status: AC

## 2021-06-17 ENCOUNTER — Telehealth: Payer: PPO

## 2021-06-28 ENCOUNTER — Other Ambulatory Visit: Payer: Self-pay

## 2021-06-28 ENCOUNTER — Ambulatory Visit (INDEPENDENT_AMBULATORY_CARE_PROVIDER_SITE_OTHER): Payer: PPO

## 2021-06-28 DIAGNOSIS — E7849 Other hyperlipidemia: Secondary | ICD-10-CM

## 2021-06-28 DIAGNOSIS — I10 Essential (primary) hypertension: Secondary | ICD-10-CM

## 2021-06-28 DIAGNOSIS — M810 Age-related osteoporosis without current pathological fracture: Secondary | ICD-10-CM

## 2021-06-28 DIAGNOSIS — K219 Gastro-esophageal reflux disease without esophagitis: Secondary | ICD-10-CM

## 2021-06-28 NOTE — Progress Notes (Signed)
Chronic Care Management Pharmacy Note  06/28/2021 Name:  [name] [name] MRN:  [id number] DOB:  Dec 20, 1940  Summary: -Patient reports pain persists, she continues with prednisone - 48m daily - will decrease to 123mdaily starting next week - managing pain with APAP prn as well - notes that she has been using Advil on occasion  -BP this AM was 125/65 HR 74 134/78, 112/64, 110/65 -Reports no issues since increasing pravastatin to 4035maily - will need updated lipid panel to assess effectiveness   Recommendations/Changes made from today's visit: -Recommended for patient to discontinue use of NSAIDS as she is taking prednisone daily, advised to not use more than 3000m17m APAP daily  -Patient to continue checking blood pressure at least once weekly and reach out should BP be out of control  -Scheduled for flu shot, advised for update COVID booster as well  Plan: F/u in 6 months     Subjective: [name] [name] 80 y31. year old female who is a primary patient of Burns, StacClaudina Lick.  The CCM team was consulted for assistance with disease management and care coordination needs.    Engaged with patient by telephone for follow up visit in response to provider referral for pharmacy case management and/or care coordination services.   Consent to Services:  The patient was given information about Chronic Care Management services, agreed to services, and gave verbal consent prior to initiation of services.  Please see initial visit note for detailed documentation.   Patient Care Team: BurnBinnie Rail as PCP - General (Internal Medicine) StarLadene Artist (Gastroenterology) HollMolli Posey (Obstetrics and Gynecology) McCuLuberta Mutter (Ophthalmology) NishJosue Hector (Cardiology) NorrNetta Cedars (Orthopedic Surgery) RamoSuella Broad (Physical Medicine and Rehabilitation) FoltCharlton HawsH [facility] ([facility]armacist)  Recent office visits: 03/20/2021 - Dr. BurnQuay Burowf/u - no changes to medications - f/u in 6 months  01/24/2021 - Dr. BurnQuay Burowvlation of arthralgia - given prednisone taper    Recent consult visits: 05/30/2021 - Dr. RiceBenjamine Molaheumatology - f/u for PMR - prednisone taper given  04/24/2021 - Dr. RiceBenjamine Molaheumatology - f/u for PMR - prednisone decreased to 5mg 78mly  03/25/2021 - Dr. Rice Benjamine Molaeumatology - bilateral steroid injection in shoulder - f/u in 1 month to discuss steroid taper  03/08/2021 - Dr. Rice Benjamine Molaeumatology - evaluation of PMR - prednisone increased to 10mg 52my   Hospital visits: None in previous 6 months  Objective:  Lab Results  Component Value Date   CREATININE 0.75 03/20/2021   BUN 18 03/20/2021   GFR 74.38 03/20/2021   GFRNONAA 75 03/12/2020   GFRAA 87 03/12/2020   NA 140 03/20/2021   K 4.7 03/20/2021   CALCIUM 9.7 03/20/2021   CO2 33 (H) 03/20/2021   GLUCOSE 90 03/20/2021    Lab Results  Component Value Date/Time   HGBA1C 6.0 03/20/2021 11:10 AM   HGBA1C 5.7 09/17/2020 11:24 AM   GFR 74.38 03/20/2021 11:10 AM   GFR 78.20 01/17/2021 10:33 AM    Last diabetic Eye exam: No results found for: HMDIABEYEEXA  Last diabetic Foot exam: No results found for: HMDIABFOOTEX   Lab Results  Component Value Date   CHOL 207 (H) 09/17/2020   HDL 55.70 09/17/2020   LDLCALC 116 (H) 09/17/2020   LDLDIRECT 158.0 06/27/2015   TRIG 176.0 (H) 09/17/2020   CHOLHDL 4 09/17/2020    Hepatic Function Latest Ref Rng &  Units 03/20/2021 01/17/2021 09/17/2020  Total Protein 6.0 - 8.3 g/dL 7.1 7.8 7.1  Albumin 3.5 - 5.2 g/dL 3.9 4.3 4.4  AST 0 - 37 U/L 18 14 17   ALT 0 - 35 U/L 18 11 12   Alk Phosphatase 39 - 117 U/L 35(L) 43 37(L)  Total Bilirubin 0.2 - 1.2 mg/dL 1.0 0.8 1.2  Bilirubin, Direct 0.0 - 0.3 mg/dL - 0.1 -    Lab Results  Component Value Date/Time   TSH 5.34 (H) 01/17/2021 10:33 AM   TSH 2.77 09/17/2020 11:24 AM   FREET4 0.75 04/30/2016 11:51 AM    CBC Latest Ref Rng & Units 01/17/2021 03/12/2020 01/11/2019  WBC  4.0 - 10.5 K/uL 11.0(H) 7.0 6.7  Hemoglobin 12.0 - 15.0 g/dL 13.7 14.2 13.8  Hematocrit 36.0 - 46.0 % 40.3 43.4 41.1  Platelets 150.0 - 400.0 K/uL 270.0 241 226.0    Lab Results  Component Value Date/Time   VD25OH 42.99 03/20/2021 11:10 AM   VD25OH 47.93 09/17/2020 11:24 AM    Clinical ASCVD: Yes  The ASCVD Risk score (Arnett DK, et al., 2019) failed to calculate for the following reasons:   The 2019 ASCVD risk score is only valid for ages 33 to 20    Depression screen PHQ 2/9 08/08/2020 03/12/2020 01/11/2019  Decreased Interest 0 0 0  Down, Depressed, Hopeless 0 0 0  PHQ - 2 Score 0 0 0  Altered sleeping - - 0  Tired, decreased energy - - 0  Change in appetite - - 0  Feeling bad or failure about yourself  - - 0  Trouble concentrating - - 0  Moving slowly or fidgety/restless - - 0  Suicidal thoughts - - 0  PHQ-9 Score - - 0  Difficult doing work/chores - - -  Some recent data might be hidden      Social History   Tobacco Use  Smoking Status Never  Smokeless Tobacco Never   BP Readings from Last 3 Encounters:  05/30/21 130/79  04/24/21 (!) 146/71  03/25/21 118/67   Pulse Readings from Last 3 Encounters:  05/30/21 63  04/24/21 69  03/25/21 61   Wt Readings from Last 3 Encounters:  05/30/21 139 lb (63 kg)  04/24/21 139 lb (63 kg)  03/25/21 137 lb 9.6 oz (62.4 kg)   BMI Readings from Last 3 Encounters:  05/30/21 27.15 kg/m  04/24/21 28.07 kg/m  03/25/21 27.79 kg/m    Assessment/Interventions: Review of patient past medical history, allergies, medications, health status, including review of consultants reports, laboratory and other test data, was performed as part of comprehensive evaluation and provision of chronic care management services.   SDOH:  (Social Determinants of Health) assessments and interventions performed: Yes  SDOH Screenings   Alcohol Screen: Low Risk    Last Alcohol Screening Score (AUDIT): 0  Depression (PHQ2-9): Low Risk    PHQ-2  Score: 0  Financial Resource Strain: Not on file  Food Insecurity: No Food Insecurity   Worried About Charity fundraiser in the Last Year: Never true   Ran Out of Food in the Last Year: Never true  Housing: Low Risk    Last Housing Risk Score: 0  Physical Activity: Inactive   Days of Exercise per Week: 0 days   Minutes of Exercise per Session: 0 min  Social Connections: Not on file  Stress: No Stress Concern Present   Feeling of Stress : Not at all  Tobacco Use: Low Risk    Smoking  Tobacco Use: Never   Smokeless Tobacco Use: Never   Passive Exposure: Not on file  Transportation Needs: No Transportation Needs   Lack of Transportation (Medical): No   Lack of Transportation (Non-Medical): No    CCM Care Plan  No Known Allergies  Medications Reviewed Today     Reviewed by Tomasa Blase, [facility] (Pharmacist) on 06/28/21 at [place] List Status: <None>   Medication Order Taking? Sig Documenting Provider Last Dose Status Informant  acetaminophen (TYLENOL) 650 MG CR tablet [id number] Yes Take 650 mg by mouth every 8 (eight) hours as needed for pain. [provider] Taking Active   Biotin 5 MG CAPS [id number]  Taking daily Binnie Rail, MD  Active   bisoprolol-hydrochlorothiazide [facility]) 5-6.25 MG tablet [id number] Yes TAKE 1 TABLET BY MOUTH EVERY DAY Binnie Rail, MD Taking Active   CALCIUM CITRATE-VITAMIN D PO [id number]  Take 600 mg by mouth in the morning and at bedtime. [provider]  Active   clobetasol ointment (TEMOVATE) 0.05 % [id number]  Apply 1 application topically 2 (two) times daily.  Patient not taking: No sig reported   [provider]  Active   denosumab (PROLIA) 60 MG/ML SOLN injection [id number] Yes Inject 60 mg into the skin every 6 (six) months. Administer in upper arm, thigh, or abdomen [provider] Taking Active   famotidine (PEPCID) 20 MG tablet [id number]  Take 20 mg by mouth daily. [provider]  Active Self     Discontinued 06/28/21 [phone] (Discontinued by provider)   levothyroxine (SYNTHROID) 50 MCG tablet [id number]  Take 1 tablet (50 mcg total) by mouth daily. Biagio Borg, MD  Active   Multiple Vitamins-Minerals (CENTRUM SILVER PO) [id number]  Take by mouth daily. [provider]  Active   omeprazole (PRILOSEC) 20 MG capsule [id number]  TAKE 1 CAPSULE BY MOUTH EVERY DAY  Patient taking differently: Take 20 mg by mouth every other day.   Binnie Rail, MD  Active   polyethylene glycol [facility] / GLYCOLAX) packet [id number]  Take 17 g by mouth daily. 2 capfuls daily [provider]  Active   pravastatin (PRAVACHOL) 40 MG tablet [id number] Yes TAKE 1 TABLET BY MOUTH EVERY DAY Burns, Claudina Lick, MD Taking Active   predniSONE (DELTASONE) 5 MG tablet [id number] Yes Take 2 tablets (10 mg total) by mouth daily with breakfast for 14 days, THEN 1.5 tablets (7.5 mg total) daily with breakfast for 14 days, THEN 1 tablet (5 mg total) daily with breakfast. Collier Salina, MD Taking Active   PROLIA 60 MG/ML SOSY injection [id number] Yes INJECT 60 MG INTO THE SKIN ONCE FOR 1 DOSE. Binnie Rail, MD Taking Active   Wheat Dextrin [facility]) POWD [id number]  Twice daily Binnie Rail, MD  Active             Patient Active Problem List   Diagnosis Date Noted   PMR (polymyalgia rheumatica) ([place]) - Dr Benjamine Mola 03/19/2021   Bilateral shoulder pain 03/08/2021   Bilateral hip pain 03/08/2021   Positive ANA (antinuclear antibody) 03/08/2021   Myalgia 01/20/2021   Chronic dermatitis 01/17/2021   Urinary incontinence 01/17/2021   Hypertensive disorder 01/17/2021   Aortic atherosclerosis ([place]) 09/17/2020   Lower back pain 11/07/2019   Hearing loss of both ears due to cerumen impaction 07/05/2019   Closed compression fracture of lumbar vertebra ([place]), T11, T12, L1 12/30/2017   Lung nodules, stable, no f/u needed 12/30/2017   Hypothyroidism 11/01/2016  Constipation 04/30/2016   Prediabetes 11/01/2015    Spinal stenosis of lumbar region 10/03/2015   DDD (degenerative disc disease), lumbar    DJD (degenerative joint disease) of knee    Hypertension 11/25/2010   History of colon polyps 05/14/2009   Arthralgia 02/06/2009   G E R D 08/17/2007   Hyperlipidemia 08/16/2007   HIATAL HERNIA 08/16/2007   DIVERTICULAR DISEASE 08/16/2007   IBS 08/16/2007   Osteoporosis on prolia 08/16/2007    Immunization History  Administered Date(s) Administered   Fluad Quad(high Dose 65+) 05/12/2019, 06/11/2020   Influenza Split 05/28/2011, 06/23/2012   Influenza Whole 05/25/2007, 05/31/2010   Influenza, High Dose Seasonal PF 06/27/2015, 04/30/2016, 05/01/2017, 05/04/2018   Influenza,inj,Quad PF,6+ Mos 04/20/2013, 06/13/2014   Moderna SARS-COV2 Booster Vaccination 07/05/2020, 12/12/2020   Moderna Sars-Covid-2 Vaccination 08/08/2019, 09/05/2019   Pneumococcal Conjugate-13 10/31/2014   Pneumococcal Polysaccharide-23 10/07/2013   Td 10/07/2013   Zoster, Live 10/31/2014    Conditions to be addressed/monitored:  Hypertension, Hyperlipidemia, Coronary Artery Disease, GERD and Osteoporosis  Care Plan : [place]  Updates made by Tomasa Blase, RPH since 06/28/2021 12:00 AM     Problem: Hypertension, Hyperlipidemia, GERD and Osteoporosis   Priority: High     Long-Range Goal: Disease management   Start Date: 12/17/2020  Expected End Date: 12/17/2021  This Visit's Progress: On track  Recent Progress: On track  Priority: High  Note:   Current Barriers:  Unable to independently monitor therapeutic efficacy Suboptimal therapeutic regimen for hyperlipidemia  Pharmacist Clinical Goal(s):  Patient will achieve adherence to monitoring guidelines and medication adherence to achieve therapeutic efficacy adhere to plan to optimize therapeutic regimen for hyperlipidemia as evidenced by report of adherence to recommended medication management changes through collaboration with PharmD and provider.    Interventions: 1:1 collaboration with Binnie Rail, MD regarding development and update of comprehensive plan of care as evidenced by provider attestation and co-signature Inter-disciplinary care team collaboration (see longitudinal plan of care) Comprehensive medication review performed; medication list updated in electronic medical record   Hypertension    BP goal < 140/90  Patient checks BP at home infrequently Patient home BP readings are ranging: this AM was 125/65 HR 74 134/78, 112/64, 110/65  Patient has failed these meds in the past: n/a Patient is currently controlled on the following meds:  bisoprolol/HCTZ 5/6.25 mg daily    We discussed: BP goals; signs/symptoms of low BP; importance of maintaining adequate hydration; benefits of monitoring BP at home consistently   Plan: Continue current medications;  Continue to monitor BP at home daily   Hyperlipidemia / hx aortic atherosclerosis    LDL goal < 100 Lab Results  Component Value Date   LDLCALC 116 (H) 09/17/2020  Patient has failed these meds in past: simvastatin, ezetimibe Control is unknown on the following medications: (LDL has not been rechecked since increasing) pravastatin 40 mg daily HS    Plan: Continue current medication, repeat lipid panel with next PCP appointment   GERD    Patient has failed these meds in past: ranitidine Patient is currently controlled on the following medications:  famotidine 20 mg every other day   omeprazole 20 mg every other day     We discussed:  Patient has been alternating PPI - H2RA every other day in attempt to limit omeprazole exposure; she reports symptoms are well controlled this way   Plan: Continue current medications   Osteoporosis  Last DEXA Scan: 03/22/2020  T-Score femoral neck: -3.0             T-Score lumbar spine: -2.4  Patient is a candidate for pharmacologic treatment due to T-Score < -2.5 in femoral neck and T-Score < -2.5 in lumbar  spine  Patient has failed these meds in past: n/a Patient is currently controlled on the following medications:  Prolia q6 months  Calcium citrate-Vitamin D 500-800 BID Vitamin D 400 IU daiily  We discussed:  Recommend [phone] units of vitamin D daily. Recommend 1200 mg of calcium daily from dietary and supplemental sources. ; advised she is getting enough Vitamin D frm the calcium pill, she may stop the extra Vitamin D pill if desired  Plan: Continue current medications  Patient Goals/Self-Care Activities Patient will:  - take medications as prescribed focus on medication adherence by pill box check blood pressure at least once weekly, document, and provide at future appointments        Medication Assistance: None required.  Patient affirms current coverage meets needs.  Patient's preferred pharmacy is:  [id number] [place] [place] [id number]: [phone]: [phone] [facility] [place]Phone: [phone]: [phone] Uses pill box? Yes Pt endorses 100% compliance  We discussed: Current pharmacy is preferred with insurance plan and patient is satisfied with pharmacy services Patient decided to: Continue current medication management strategy  Care Plan and Follow Up Patient Decision:  Patient agrees to Care Plan and Follow-up.  Plan: Telephone follow up appointment with care management team member scheduled for:  6 months  months   DTomasa Blase PharmD Clinical Pharmacist, [place]

## 2021-06-28 NOTE — Patient Instructions (Signed)
Visit Information  Following are the goals we discussed today:   Manage My Medications   Timeframe:  Long-Range Goal Priority:  Medium Start Date:      12/17/20                      Expected End Date:  07/19/22                     Follow Up Date 12/26/2021   - call for medicine refill 2 or 3 days before it runs out - call if I am sick and can't take my medicine - keep a list of all the medicines I take; vitamins and herbals too - use a pillbox to sort medicine    Why is this important?   These steps will help you keep on track with your medicines.  Plan: Telephone follow up appointment with care management team member scheduled for:  6 months  The patient has been provided with contact information for the care management team and has been advised to call with any health related questions or concerns.   Tomasa Blase, PharmD Clinical Pharmacist, [facility]   Please call the care guide team at [phone] if you need to cancel or reschedule your appointment.   Patient verbalizes understanding of instructions provided today and agrees to view in [place].

## 2021-07-03 ENCOUNTER — Ambulatory Visit (INDEPENDENT_AMBULATORY_CARE_PROVIDER_SITE_OTHER): Payer: PPO

## 2021-07-03 ENCOUNTER — Other Ambulatory Visit: Payer: Self-pay

## 2021-07-03 DIAGNOSIS — Z23 Encounter for immunization: Secondary | ICD-10-CM

## 2021-07-27 DIAGNOSIS — M810 Age-related osteoporosis without current pathological fracture: Secondary | ICD-10-CM | POA: Diagnosis not present

## 2021-07-27 DIAGNOSIS — E7849 Other hyperlipidemia: Secondary | ICD-10-CM

## 2021-07-27 DIAGNOSIS — I10 Essential (primary) hypertension: Secondary | ICD-10-CM

## 2021-07-31 DIAGNOSIS — Z6828 Body mass index (BMI) 28.0-28.9, adult: Secondary | ICD-10-CM | POA: Diagnosis not present

## 2021-07-31 DIAGNOSIS — Z1231 Encounter for screening mammogram for malignant neoplasm of breast: Secondary | ICD-10-CM | POA: Diagnosis not present

## 2021-08-16 ENCOUNTER — Ambulatory Visit (INDEPENDENT_AMBULATORY_CARE_PROVIDER_SITE_OTHER): Payer: PPO

## 2021-08-16 ENCOUNTER — Other Ambulatory Visit: Payer: Self-pay

## 2021-08-16 DIAGNOSIS — Z0000 Encounter for general adult medical examination without abnormal findings: Secondary | ICD-10-CM

## 2021-08-16 NOTE — Progress Notes (Signed)
I connected with [name] [name] today by telephone and verified that I am speaking with the correct person using two identifiers. Location patient: home Location provider: work Persons participating in the virtual visit: patient, provider.   I discussed the limitations, risks, security and privacy concerns of performing an evaluation and management service by telephone and the availability of in person appointments. I also discussed with the patient that there may be a patient responsible charge related to this service. The patient expressed understanding and verbally consented to this telephonic visit.    Interactive audio and video telecommunications were attempted between this provider and patient, however failed, due to patient having technical difficulties OR patient did not have access to video capability.  We continued and completed visit with audio only.  Some vital signs may be absent or patient reported.   Time Spent with patient on telephone encounter: 40 minutes  Subjective:   [name] [name] is a 81 y.o. female who presents for Medicare Annual (Subsequent) preventive examination.  Review of Systems     Cardiac Risk Factors include: advanced age (>79men, >40 women);dyslipidemia;family history of premature cardiovascular disease;hypertension     Objective:    Today's Vitals   08/16/21 1545  PainSc: 10-Worst pain ever   There is no height or weight on file to calculate BMI.  Advanced Directives 08/16/2021 08/08/2020 11/30/2019 12/08/2018 12/01/2017 11/26/2016 10/31/2014  Does Patient Have a Medical Advance Directive? Yes No No Yes Yes Yes No  Type of Paramedic of [place];Living will - - [place];Living will [place];Living will [place];Living will -  Does patient want to make changes to medical advance directive? No - Patient declined - - - - - -  Copy of [place] in Chart? No - copy  requested - - No - copy requested No - copy requested No - copy requested -  Would patient like information on creating a medical advance directive? - No - Patient declined No - Patient declined - - - Yes - Educational materials given    Current Medications (verified) Outpatient Encounter Medications as of 08/16/2021  Medication Sig   acetaminophen (TYLENOL) 650 MG CR tablet Take 650 mg by mouth every 8 (eight) hours as needed for pain.   Biotin 5 MG CAPS Taking daily   bisoprolol-hydrochlorothiazide (ZIAC) 5-6.25 MG tablet TAKE 1 TABLET BY MOUTH EVERY DAY   CALCIUM CITRATE-VITAMIN D PO Take 600 mg by mouth in the morning and at bedtime.   clobetasol ointment (TEMOVATE) 6.22 % Apply 1 application topically 2 (two) times daily. (Patient not taking: No sig reported)   denosumab (PROLIA) 60 MG/ML SOLN injection Inject 60 mg into the skin every 6 (six) months. Administer in upper arm, thigh, or abdomen   famotidine (PEPCID) 20 MG tablet Take 20 mg by mouth daily.   levothyroxine (SYNTHROID) 50 MCG tablet Take 1 tablet (50 mcg total) by mouth daily.   Multiple Vitamins-Minerals (CENTRUM SILVER PO) Take by mouth daily.   omeprazole (PRILOSEC) 20 MG capsule TAKE 1 CAPSULE BY MOUTH EVERY DAY (Patient taking differently: Take 20 mg by mouth every other day.)   polyethylene glycol (MIRALAX / GLYCOLAX) packet Take 17 g by mouth daily. 2 capfuls daily   pravastatin (PRAVACHOL) 40 MG tablet TAKE 1 TABLET BY MOUTH EVERY DAY   PROLIA 60 MG/ML SOSY injection INJECT 60 MG INTO THE SKIN ONCE FOR 1 DOSE.   Wheat Dextrin (BENEFIBER) POWD Twice daily  No facility-administered encounter medications on file as of 08/16/2021.    Allergies (verified) Patient has no known allergies.   History: Past Medical History:  Diagnosis Date   Benign neoplasm of colon    Cataract    bilateral,had surgery   DDD (degenerative disc disease), lumbar    MRI w. [facility] 05/2015, s/p ESI x 1 w/ improvement   Diaphragmatic  hernia without mention of obstruction or gangrene    Diverticulosis of colon (without mention of hemorrhage)    DJD (degenerative joint disease) of knee    L>R, IA cortisone by [facility] in L 03/2015   Esophageal reflux    Gallstones    History of chicken pox    Hyperlipidemia    Hypertension    Irritable bowel syndrome    Migraines    Obesity, unspecified    Osteoporosis, unspecified    Tachycardia    Unspecified hemorrhoids with other complication    Past Surgical History:  Procedure Laterality Date   APPENDECTOMY  1979   CATARACT EXTRACTION     CHOLECYSTECTOMY  1989   COLONOSCOPY     POLYPECTOMY     VAGINAL HYSTERECTOMY  1996   Family History  Problem Relation Age of Onset   Stroke Mother    Dementia Mother    Stroke Father    Heart disease Sister        x 2   Colon cancer Sister    Stroke Sister 50   Heart disease Brother    Social History   Socioeconomic History   Marital status: Married    Spouse name: Not on file   Number of children: 2   Years of education: Not on file   Highest education level: Not on file  Occupational History   Occupation: retired  Tobacco Use   Smoking status: Never   Smokeless tobacco: Never  Vaping Use   Vaping Use: Never used  Substance and Sexual Activity   Alcohol use: No    Alcohol/week: 0.0 standard drinks   Drug use: No   Sexual activity: Not Currently  Other Topics Concern   Not on file  Social History Narrative   Exercise: no regular exercise   Social Determinants of Health   Financial Resource Strain: Low Risk    Difficulty of Paying Living Expenses: Not hard at all  Food Insecurity: No Food Insecurity   Worried About Charity fundraiser in the Last Year: Never true   Arboriculturist in the Last Year: Never true  Transportation Needs: No Transportation Needs   Lack of Transportation (Medical): No   Lack of Transportation (Non-Medical): No  Physical Activity: Inactive   Days of Exercise per Week: 0 days    Minutes of Exercise per Session: 0 min  Stress: No Stress Concern Present   Feeling of Stress : Not at all  Social Connections: Moderately Integrated   Frequency of Communication with Friends and Family: More than three times a week   Frequency of Social Gatherings with Friends and Family: More than three times a week   Attends Religious Services: More than 4 times per year   Active Member of [employer] or Organizations: Yes   Attends Archivist Meetings: More than 4 times per year   Marital Status: Widowed    Tobacco Counseling Counseling given: Not Answered   Clinical Intake:  Pre-visit preparation completed: Yes  Pain : 0-10 Pain Score: 10-Worst pain ever Pain Type: Chronic pain Pain Location:  Arm Pain Orientation: Right, Left Pain Radiating Towards: bilateral legs Pain Descriptors / Indicators: Discomfort Pain Onset: More than a month ago Pain Frequency: Intermittent Pain Relieving Factors: Tylenol, prednisone and steroid injections Effect of Pain on Daily Activities: Pain produces disability and affects the quality of life.  Pain Relieving Factors: Tylenol, prednisone and steroid injections  Nutritional Risks: None Diabetes: No  How often do you need to have someone help you when you read instructions, pamphlets, or other written materials from your doctor or pharmacy?: 1 - Never What is the last grade level you completed in school?: High School Graduate  Diabetic? no  Interpreter Needed?: No  Information entered by :: Lisette Abu, LPN   Activities of Daily Living In your present state of health, do you have any difficulty performing the following activities: 08/16/2021  Hearing? N  Vision? N  Difficulty concentrating or making decisions? N  Walking or climbing stairs? N  Dressing or bathing? N  Doing errands, shopping? N  Preparing Food and eating ? N  Using the Toilet? N  In the past six months, have you accidently leaked urine? Y  Do you have  problems with loss of bowel control? N  Managing your Medications? N  Managing your Finances? N  Housekeeping or managing your Housekeeping? N  Some recent data might be hidden    Patient Care Team: Binnie Rail, MD as PCP - General (Internal Medicine) Ladene Artist, MD (Gastroenterology) Molli Posey, MD (Obstetrics and Gynecology) Luberta Mutter, MD (Ophthalmology) Josue Hector, MD (Cardiology) Netta Cedars, MD (Orthopedic Surgery) Suella Broad, MD (Physical Medicine and Rehabilitation) Charlton Haws, [facility] as Pharmacist (Pharmacist)  Indicate any recent Medical Services you may have received from other than [facility] providers in the past year (date may be approximate).     Assessment:   This is a routine wellness examination for [facility].  Hearing/Vision screen Hearing Screening - Comments:: Patient has decreased hearing. No hearing aids.  Vision Screening - Comments:: Patient wears corrective glasses/contacts.  Eye exam done annually by: Dr. Luberta Mutter.  Dietary issues and exercise activities discussed: Current Exercise Habits: The patient does not participate in regular exercise at present, Exercise limited by: orthopedic condition(s)   Goals Addressed               This Visit's Progress     Patient Stated (pt-stated)        My goal is pain management for PMR.      Depression Screen PHQ 2/9 Scores 08/16/2021 08/08/2020 03/12/2020 01/11/2019 12/08/2018 12/01/2017 11/26/2016  PHQ - 2 Score 0 0 0 0 1 1 0  PHQ- 9 Score - - - 0 1 1 -    Fall Risk Fall Risk  08/16/2021 08/08/2020 08/02/2019 01/11/2019 12/08/2018  Falls in the past year? 1 0 0 0 0  Number falls in past yr: 0 0 0 0 0  Injury with Fall? 0 0 - - -  Risk for fall due to : No Fall Risks No Fall Risks - - -  Risk for fall due to: Comment - - - - -  Follow up Falls evaluation completed Falls evaluation completed - - -    FALL RISK PREVENTION PERTAINING TO THE HOME:  Any stairs in or around  the home? Yes  If so, are there any without handrails? No  Home free of loose throw rugs in walkways, pet beds, electrical cords, etc? Yes  Adequate lighting in your home to reduce risk of  falls? Yes   ASSISTIVE DEVICES UTILIZED TO PREVENT FALLS:  Life alert? No  Use of a cane, walker or w/c? No  Grab bars in the bathroom? Yes  Shower chair or bench in shower? No  Elevated toilet seat or a handicapped toilet? No   TIMED UP AND GO:  Was the test performed? No .  Length of time to ambulate 10 feet: n/a sec.   Gait steady and fast without use of assistive device  Cognitive Function: Normal cognitive status assessed by direct observation by this Nurse Health Advisor. No abnormalities found.   MMSE - Mini Mental State Exam 12/01/2017 11/26/2016  Orientation to time 5 5  Orientation to Place 5 5  Registration 3 3  Attention/ Calculation 4 5  Recall 3 2  Language- name 2 objects 2 2  Language- repeat 1 1  Language- follow 3 step command 3 3  Language- read & follow direction 1 1  Write a sentence 1 1  Copy design 1 1  Total score 29 29        Immunizations Immunization History  Administered Date(s) Administered   Fluad Quad(high Dose 65+) 05/12/2019, 06/11/2020, 07/03/2021   Influenza Split 05/28/2011, 06/23/2012   Influenza Whole 05/25/2007, 05/31/2010   Influenza, High Dose Seasonal PF 06/27/2015, 04/30/2016, 05/01/2017, 05/04/2018   Influenza,inj,Quad PF,6+ Mos 04/20/2013, 06/13/2014   Moderna SARS-COV2 Booster Vaccination 07/05/2020, 12/12/2020   Moderna Sars-Covid-2 Vaccination 08/08/2019, 09/05/2019   Pneumococcal Conjugate-13 10/31/2014   Pneumococcal Polysaccharide-23 10/07/2013   Td 10/07/2013   Zoster, Live 10/31/2014    TDAP status: Up to date  Flu Vaccine status: Up to date  Pneumococcal vaccine status: Up to date  Covid-19 vaccine status: Completed vaccines  Qualifies for Shingles Vaccine? Yes   Zostavax completed Yes   Shingrix Completed?: No.     Education has been provided regarding the importance of this vaccine. Patient has been advised to call insurance company to determine out of pocket expense if they have not yet received this vaccine. Advised may also receive vaccine at local pharmacy or Health Dept. Verbalized acceptance and understanding.  Screening Tests Health Maintenance  Topic Date Due   Zoster Vaccines- Shingrix (1 of 2) Never done   COVID-19 Vaccine (3 - Booster for Moderna series) 02/06/2021   COLONOSCOPY (Pts 45-17yrs Insurance coverage will need to be confirmed)  06/24/2021   DEXA SCAN  03/22/2022   TETANUS/TDAP  10/08/2023   Pneumonia Vaccine 4+ Years old  Completed   INFLUENZA VACCINE  Completed   HPV VACCINES  Aged Out    Health Maintenance  Health Maintenance Due  Topic Date Due   Zoster Vaccines- Shingrix (1 of 2) Never done   COVID-19 Vaccine (3 - Booster for Moderna series) 02/06/2021   COLONOSCOPY (Pts 45-9yrs Insurance coverage will need to be confirmed)  06/24/2021    Colorectal cancer screening: Type of screening: Colonoscopy. Completed 06/24/2016. Repeat every 5 years  Mammogram status: Completed 04/03/2021. Repeat every year  Bone Density status: Completed 03/22/2020. Results reflect: Bone density results: OSTEOPOROSIS. Repeat every 2 years.  Lung Cancer Screening: (Low Dose CT Chest recommended if Age 50-80 years, 30 pack-year currently smoking OR have quit w/in 15years.) does not qualify.   Lung Cancer Screening Referral: no  Additional Screening:  Hepatitis C Screening: does not qualify; Completed no  Vision Screening: Recommended annual ophthalmology exams for early detection of glaucoma and other disorders of the eye. Is the patient up to date with their annual eye exam?  Yes  Who is the provider or what is the name of the office in which the patient attends annual eye exams? Dr. Luberta Mutter If pt is not established with a provider, would they like to be referred to a provider  to establish care? No .   Dental Screening: Recommended annual dental exams for proper oral hygiene  Community Resource Referral / Chronic Care Management: CRR required this visit?  No   CCM required this visit?  No      Plan:     I have personally reviewed and noted the following in the patients chart:   Medical and social history Use of alcohol, tobacco or illicit drugs  Current medications and supplements including opioid prescriptions.  Functional ability and status Nutritional status Physical activity Advanced directives List of other physicians Hospitalizations, surgeries, and ER visits in previous 12 months Vitals Screenings to include cognitive, depression, and falls Referrals and appointments  In addition, I have reviewed and discussed with patient certain preventive protocols, quality metrics, and best practice recommendations. A written personalized care plan for preventive services as well as general preventive health recommendations were provided to patient.     Sheral Flow, LPN   11/02/6806   Nurse Notes:  Patient is cogitatively intact. There were no vitals filed for this visit. There is no height or weight on file to calculate BMI.

## 2021-08-18 NOTE — Progress Notes (Signed)
Office Visit Note  Patient: [name] [name]             Date of Birth: 01-07-1941           MRN: [id number]             PCP: Binnie Rail, MD Referring: Binnie Rail, MD Visit Date: 08/19/2021   Subjective:  Pain of the Right Shoulder, Pain of the Left Shoulder, Pain of the Right Leg, Pain of the Left Leg, and Pain of the Neck   History of Present Illness: [name] [name] is a 81 y.o. female here for follow up for bilateral shoulder pain possible PMR with prednisone taper.  She noticed when taking prednisone both at 10 mg down to 5 mg dose there was a very small benefit to pain in her shoulders and hips.  She is now off the medication and back to having more trouble with this.  The shoulder pain is most severe when trying to reach overhead or behind her back such as when getting dressed and putting on a sweater.  Hip pain is most noticeable when rising from a prolonged sitting position.  She is interested in trial of repeat shoulder joint injections because she had a very good benefit for at least the initial 1 month with trying this last year.  Previous HPI 05/30/21 [name] [name] is a 81 y.o. female here for follow-up of suspected possible PMR after discontinuing prednisone and bilateral shoulder injection at her last clinic visit she was doing extremely well.  She felt pain-free for 1 month with injections but has returned all the way back to similar severity as her original onset.  Pain is localized to the shoulders and hips does not extend distally into the extremities.  She is very stiff every morning with a partial improvement during the day.  03/08/21 [name] [name] is a 81 y.o. female here for positive ANA and body aches in multiple sites. Symptoms developed following some URI type symptoms that resolved quickly but with ongoing joint and muscles aches including bilateral shoulders. She experienced symptoms improvement with oral prednisone quickly within about 2 days of starting that  medication.  She denies ever having similar symptoms in the past.  She has not noticed any particular swelling redness or warmth in the involved areas.  There is very prolonged morning stiffness sometimes lasting throughout the day.  She was previously concerned with muscle aches and weakness could represent statin induced myopathy so stopped taking the statin medication and did not notice any difference in symptoms. She denies any new rashes, oral ulcers, persistent lymphadenopathy, weight loss, Raynaud's symptoms, or history of blood clots. She reports thinks there was some type of imaging of her shoulders obtained in Dr. Eilleen Kempf office that was not available for review.   Review of Systems  Constitutional:  Negative for fever.  Musculoskeletal:  Positive for joint pain, joint pain and morning stiffness.  Skin:  Negative for rash.  Neurological:  Negative for weakness.   PMFS History:  Patient Active Problem List   Diagnosis Date Noted   PMR (polymyalgia rheumatica) (HCC) - Dr Benjamine Mola 03/19/2021   Bilateral shoulder pain 03/08/2021   Bilateral hip pain 03/08/2021   Positive ANA (antinuclear antibody) 03/08/2021   Myalgia 01/20/2021   Chronic dermatitis 01/17/2021   Urinary incontinence 01/17/2021   Hypertensive disorder 01/17/2021   Aortic atherosclerosis ([place]) 09/17/2020   Lower back pain 11/07/2019   Hearing loss of both ears due  to cerumen impaction 07/05/2019   Closed compression fracture of lumbar vertebra ([place]), T11, T12, L1 12/30/2017   Lung nodules, stable, no f/u needed 12/30/2017   Hypothyroidism 11/01/2016   Constipation 04/30/2016   Prediabetes 11/01/2015   Spinal stenosis of lumbar region 10/03/2015   DDD (degenerative disc disease), lumbar    DJD (degenerative joint disease) of knee    Hypertension 11/25/2010   History of colon polyps 05/14/2009   Arthralgia 02/06/2009   G E R D 08/17/2007   Hyperlipidemia 08/16/2007   HIATAL HERNIA 08/16/2007   DIVERTICULAR DISEASE  08/16/2007   IBS 08/16/2007   Osteoporosis on prolia 08/16/2007    Past Medical History:  Diagnosis Date   Benign neoplasm of colon    Cataract    bilateral,had surgery   DDD (degenerative disc disease), lumbar    MRI w. [facility] 05/2015, s/p ESI x 1 w/ improvement   Diaphragmatic hernia without mention of obstruction or gangrene    Diverticulosis of colon (without mention of hemorrhage)    DJD (degenerative joint disease) of knee    L>R, IA cortisone by [facility] in L 03/2015   Esophageal reflux    Gallstones    History of chicken pox    Hyperlipidemia    Hypertension    Irritable bowel syndrome    Migraines    Obesity, unspecified    Osteoporosis, unspecified    Tachycardia    Unspecified hemorrhoids with other complication     Family History  Problem Relation Age of Onset   Stroke Mother    Dementia Mother    Stroke Father    Heart disease Sister        x 2   Colon cancer Sister    Stroke Sister 3   Heart disease Brother    Past Surgical History:  Procedure Laterality Date   APPENDECTOMY  1979   CATARACT EXTRACTION     CHOLECYSTECTOMY  1989   COLONOSCOPY     POLYPECTOMY     VAGINAL HYSTERECTOMY  1996   Social History   Social History Narrative   Exercise: no regular exercise   Immunization History  Administered Date(s) Administered   Fluad Quad(high Dose 65+) 05/12/2019, 06/11/2020, 07/03/2021   Influenza Split 05/28/2011, 06/23/2012   Influenza Whole 05/25/2007, 05/31/2010   Influenza, High Dose Seasonal PF 06/27/2015, 04/30/2016, 05/01/2017, 05/04/2018   Influenza,inj,Quad PF,6+ Mos 04/20/2013, 06/13/2014   Moderna SARS-COV2 Booster Vaccination 07/05/2020, 12/12/2020   Moderna Sars-Covid-2 Vaccination 08/08/2019, 09/05/2019   Pneumococcal Conjugate-13 10/31/2014   Pneumococcal Polysaccharide-23 10/07/2013   Td 10/07/2013   Zoster, Live 10/31/2014     Objective: Vital Signs: BP 136/74    Pulse 71    Ht 5' (1.524 m)    Wt 143 lb 6.4 oz (65 kg)     BMI 28.01 kg/m    Physical Exam Cardiovascular:     Rate and Rhythm: Normal rate and regular rhythm.  Pulmonary:     Effort: Pulmonary effort is normal.     Breath sounds: Normal breath sounds.  Skin:    General: Skin is warm and dry.     Findings: No rash.  Neurological:     Mental Status: She is alert.  Psychiatric:        Mood and Affect: Mood normal.     Musculoskeletal Exam:  Bilateral shoulder pain is provoked with overhead abduction range of motion or reaching behind her low back, mild tenderness to direct palpation of the shoulders anteriorly and over  trapezius muscles, normal elbows wrists and hand joints showing osteoarthritis changes without focal tenderness or swelling Hip and knee strength appears normal there is some pain localizing to the anterior of the hip joint with resisted flexion otherwise not reproducible with palpation and other range of motion  Investigation: No additional findings.  Imaging: No results found.  Recent Labs: Lab Results  Component Value Date   WBC 11.0 (H) 01/17/2021   HGB 13.7 01/17/2021   PLT 270.0 01/17/2021   NA 140 03/20/2021   K 4.7 03/20/2021   CL 100 03/20/2021   CO2 33 (H) 03/20/2021   GLUCOSE 90 03/20/2021   BUN 18 03/20/2021   CREATININE 0.75 03/20/2021   BILITOT 1.0 03/20/2021   ALKPHOS 35 (L) 03/20/2021   AST 18 03/20/2021   ALT 18 03/20/2021   PROT 7.1 03/20/2021   ALBUMIN 3.9 03/20/2021   CALCIUM 9.7 03/20/2021   GFRAA 87 03/12/2020    Speciality Comments: No specialty comments available.  Procedures:  Large Joint Inj: bilateral subacromial bursa on 08/19/2021 2:25 PM Indications: pain Details: 25 G 1.5 in needle, lateral approach Medications (Right): 3 mL lidocaine 1 %; 40 mg triamcinolone acetonide 40 MG/ML Medications (Left): 3 mL lidocaine 1 %; 40 mg triamcinolone acetonide 40 MG/ML Outcome: tolerated well, no immediate complications Procedure, treatment alternatives, risks and benefits explained,  specific risks discussed. Consent was given by the patient. Immediately prior to procedure a time out was called to verify the correct patient, procedure, equipment, support staff and site/side marked as required. Patient was prepped and draped in the usual sterile fashion.    Allergies: Patient has no known allergies.   Assessment / Plan:     Visit Diagnoses: PMR (polymyalgia rheumatica) (HCC) - Dr Benjamine Mola - Plan: Large Joint Inj: bilateral subacromial bursa  Possible PMR this diagnosis seems somewhat less likely as she continues to report not a very great response to steroids except when on very high dose of medication.  Additionally the calcific tendinitis seen on shoulder x-rays typically indicates more chronic inflammation causes in the rotator cuff.  We will try going back to more targeted treatment for now the benefit does not seem to outweigh the risks with ongoing systemic steroids.  No new signs or symptoms concerning for GCA or other systemic inflammation.  Chronic pain of both shoulders - Plan: Large Joint Inj: bilateral subacromial bursa  Ongoing shoulder pain looks pretty consistent with bursitis or rotator cuff pathology with calcific tendinitis on imaging.  Steroid joint injection performed in bilateral shoulders and subacromial bursa today.  I will refer to physical therapy to work on this hopefully getting started while she has some relief to prevent relapse again.  Orders: Orders Placed This Encounter  Procedures   Large Joint Inj: bilateral subacromial bursa   Ambulatory referral to Physical Therapy   No orders of the defined types were placed in this encounter.    Follow-Up Instructions: Return in about 3 months (around 11/17/2021) for PMR/tendonitis injection+PT f/u 19mos.   Collier Salina, MD  Note - This record has been created using [employer].  Chart creation errors have been sought, but may not always  have been located. Such creation errors do not  reflect on  the standard of medical care.

## 2021-08-19 ENCOUNTER — Other Ambulatory Visit: Payer: Self-pay

## 2021-08-19 ENCOUNTER — Ambulatory Visit: Payer: PPO | Admitting: Internal Medicine

## 2021-08-19 ENCOUNTER — Encounter: Payer: Self-pay | Admitting: Internal Medicine

## 2021-08-19 VITALS — BP 136/74 | HR 71 | Ht 60.0 in | Wt 143.4 lb

## 2021-08-19 DIAGNOSIS — M25512 Pain in left shoulder: Secondary | ICD-10-CM | POA: Diagnosis not present

## 2021-08-19 DIAGNOSIS — M353 Polymyalgia rheumatica: Secondary | ICD-10-CM | POA: Diagnosis not present

## 2021-08-19 DIAGNOSIS — M25511 Pain in right shoulder: Secondary | ICD-10-CM

## 2021-08-19 DIAGNOSIS — G8929 Other chronic pain: Secondary | ICD-10-CM

## 2021-08-19 MED ORDER — LIDOCAINE HCL 1 % IJ SOLN
3.0000 mL | INTRAMUSCULAR | Status: AC | PRN
  Administered 2021-08-19: 3 mL

## 2021-08-19 MED ORDER — TRIAMCINOLONE ACETONIDE 40 MG/ML IJ SUSP
40.0000 mg | INTRAMUSCULAR | Status: AC | PRN
  Administered 2021-08-19: 40 mg via INTRA_ARTICULAR

## 2021-08-19 NOTE — Patient Instructions (Signed)
You can use NSAIDs (ibuprofen, naproxen) medications along with tylenol (acetaminophen) as needed for joint pains stay within recommended doses and take with food  We are referring you for physical therapy today

## 2021-08-26 ENCOUNTER — Other Ambulatory Visit (HOSPITAL_COMMUNITY): Payer: Self-pay

## 2021-08-26 MED ORDER — ZOSTER VAC RECOMB ADJUVANTED 50 MCG/0.5ML IM SUSR
0.5000 mL | INTRAMUSCULAR | 1 refills | Status: DC
Start: 2021-08-26 — End: 2022-04-02
  Filled 2021-08-26: qty 0.5, 1d supply, fill #0
  Filled 2021-11-04 (×2): qty 0.5, 1d supply, fill #1

## 2021-09-03 ENCOUNTER — Other Ambulatory Visit (HOSPITAL_COMMUNITY): Payer: Self-pay

## 2021-09-03 NOTE — Therapy (Signed)
OUTPATIENT PHYSICAL THERAPY EVALUATION   Patient Name: [name] [name] MRN: [id number] DOB:Jan 21, 1941, 81 y.o., female Today's Date: 09/06/2021   PT End of Session - 09/05/21 1355     Visit Number 1    Number of Visits 8    Date for PT Re-Evaluation 10/31/21    Authorization Type [employer]    Authorization Time Period FOTO by 6th visit    PT Start Time 1355    PT Stop Time 1440    PT Time Calculation (min) 45 min    Activity Tolerance Patient tolerated treatment well    Behavior During Therapy [facility] for tasks assessed/performed             Past Medical History:  Diagnosis Date   Benign neoplasm of colon    Cataract    bilateral,had surgery   DDD (degenerative disc disease), lumbar    MRI w. [facility] 05/2015, s/p ESI x 1 w/ improvement   Diaphragmatic hernia without mention of obstruction or gangrene    Diverticulosis of colon (without mention of hemorrhage)    DJD (degenerative joint disease) of knee    L>R, IA cortisone by [facility] in L 03/2015   Esophageal reflux    Gallstones    History of chicken pox    Hyperlipidemia    Hypertension    Irritable bowel syndrome    Migraines    Obesity, unspecified    Osteoporosis, unspecified    Tachycardia    Unspecified hemorrhoids with other complication    Past Surgical History:  Procedure Laterality Date   APPENDECTOMY  1979   CATARACT EXTRACTION     [place]   Patient Active Problem List   Diagnosis Date Noted   PMR (polymyalgia rheumatica) ([place]) - Dr Benjamine Mola 03/19/2021   Bilateral shoulder pain 03/08/2021   Bilateral hip pain 03/08/2021   Positive ANA (antinuclear antibody) 03/08/2021   Myalgia 01/20/2021   Chronic dermatitis 01/17/2021   Urinary incontinence 01/17/2021   Hypertensive disorder 01/17/2021   Aortic atherosclerosis ([place]) 09/17/2020   Lower back pain 11/07/2019   Hearing loss of both ears due to cerumen impaction  07/05/2019   Closed compression fracture of lumbar vertebra ([place]), T11, T12, L1 12/30/2017   Lung nodules, stable, no f/u needed 12/30/2017   Hypothyroidism 11/01/2016   Constipation 04/30/2016   Prediabetes 11/01/2015   Spinal stenosis of lumbar region 10/03/2015   DDD (degenerative disc disease), lumbar    DJD (degenerative joint disease) of knee    Hypertension 11/25/2010   History of colon polyps 05/14/2009   Arthralgia 02/06/2009   G E R D 08/17/2007   Hyperlipidemia 08/16/2007   HIATAL HERNIA 08/16/2007   DIVERTICULAR DISEASE 08/16/2007   IBS 08/16/2007   Osteoporosis on prolia 08/16/2007    PCP: Binnie Rail, MD  REFERRING PROVIDER: Collier Salina, MD  REFERRING DIAG: Chronic pain of both shoulders  THERAPY DIAG:  Chronic left shoulder pain  Chronic right shoulder pain  Muscle weakness (generalized)  Pain in left hip  Pain in right hip  Chronic pain of left knee  Chronic pain of right knee   ONSET DATE: ongoing approximately 8-10 months   SUBJECTIVE:            SUBJECTIVE STATEMENT: Patient reports she was diagnosed with polymyalgia and has been on medication. She has had several steroid injections in both shoulders and most  recent was in January so they are good. Prior to the injection she had a lot of trouble reaching around her back to shower and get dressed. Patient also report pain of bilateral hips, and knees.   PERTINENT HISTORY: Polymalgia rheumatica  PAIN:  Are you having pain? No NPRS scale: 0/10 Pain location: Shoulder Pain orientation: Bilateral PAIN TYPE: Chronic Pain description: Intermittent, aching Aggravating factors: Reaching overhead or behind back, lifting Relieving factors: Rest, medication/injections  PRECAUTIONS: None  WEIGHT BEARING RESTRICTIONS No  FALLS:  Has patient fallen in last 6 months? No  LIVING ENVIRONMENT: Lives with: lives with their family  OCCUPATION: Retired  PLOF: Independent  PATIENT  GOALS: Keep shoulder pain from returning, improve pain of hips and knees   OBJECTIVE:  DIAGNOSTIC FINDINGS:  X-ray right shoulder: AC joint degenerative arthritis and distal clavicle changes consistent with history of remote fracture no significant bony arthritis of the and humeral joint seen X-ray left shoulder: Suspect calcific tendinopathy, mild glenohumeral joint osteoarthritis change  PATIENT SURVEYS:  FOTO 70% functional status (predicted 67% functional status)  COGNITION: Overall cognitive status: Within functional limits for tasks assessed     SENSATION:  Light touch: Appears intact  POSTURE: Rounded shoulder and forward head posture  PALPATION: Non tender to palpation for bilateral shoulders  UPPER EXTREMITY AROM:  AROM Right 09/05/2021 Left 09/05/2021  Shoulder flexion 130 130  Shoulder extension 60 60  Shoulder abduction* 150 150  Shoulder internal rotation T10 T10  Shoulder external rotation T2 T1   *compensated scaption at end range abduction  UPPER EXTREMITY MMT:  MMT Right 09/05/2021 Left 09/05/2021  Shoulder flexion 4+ 4+  Shoulder extension 4 4  Shoulder abduction 4+ 4+  Shoulder internal rotation 5 5  Shoulder external rotation 4 4  Periscapular musculature Grossly 4-   SHOULDER SPECIAL TESTS:  Not assessed  JOINT MOBILITY TESTING:  Grossly WFL    TODAY'S TREATMENT:  Wall slide flexion x 10 Banded ER with yellow x 10 each Banded Row with red x 10  PATIENT EDUCATION: Education details: Exam findings, POC, HEP Person educated: Patient Education method: Explanation, Demonstration, Tactile cues, Verbal cues, and Handouts Education comprehension: verbalized understanding, returned demonstration, verbal cues required, tactile cues required, and needs further education  HOME EXERCISE PROGRAM: Access Code: [email or web address]   ASSESSMENT: CLINICAL IMPRESSION: Patient is a 81 y.o. female who was seen today for physical therapy evaluation and treatment for  chronic bilateral shoulder pain, as well as bilateral hip and knee pain. Today's visit focused on evaluation for shoulder's and further exam for bilateral hips and knees can be performed at future visits as needed. She does exhibit gross limitations in shoulder elevation with kyphotic posture, rotator cuff and periscapular strength deficit. Objective impairments include decreased activity tolerance, decreased ROM, decreased strength, postural dysfunction, and pain. These impairments are limiting patient from cleaning, community activity, meal prep, laundry, yard work, and shopping. Personal factors including Age, Fitness, Past/current experiences, and Time since onset of injury/illness/exacerbation are also affecting patient's functional outcome. Patient will benefit from skilled PT to address above impairments and improve overall function.  REHAB POTENTIAL: Good  CLINICAL DECISION MAKING: Stable/uncomplicated  EVALUATION COMPLEXITY: Low   GOALS: Goals reviewed with patient? Yes  SHORT TERM GOALS:  STG Name Target Date Goal status  1 Patient will be I with initial HEP in order to progress with therapy. Baseline: provided at eval 10/03/2021 INITIAL   LONG TERM GOALS:   LTG Name Target Date Goal status  1 Patient  will be I with final HEP to maintain progress from PT. Baseline: provided at eval 10/31/2021 INITIAL  2 Patient will demonstrate improve shoulder elevation >/= 150 deg in order to improve reach into upper cabinet Baseline: 130 deg 10/31/2021 INITIAL  3 Patient will demonstrate shoulder strength grossly 5/5 and periscapular strength grossly 4/5 MMT to improve lifting ability Baseline: patient demonstrates strength deficits 10/31/2021 INITIAL  4 Patient will report continued pain relief of bilateral shoulders with no limitation in dressing or bathing tasks Baseline: patient with no pain at eval 10/31/2021 INITIAL   Establish more goals for hips/knees as needed      PLAN: PT FREQUENCY:  1x/week  PT DURATION: 8 weeks  PLANNED INTERVENTIONS: Therapeutic exercises, Therapeutic activity, Neuro Muscular re-education, Balance training, Gait training, Patient/Family education, Joint mobilization, Aquatic Therapy, Dry Needling, Cryotherapy, Moist heat, and Manual therapy  PLAN FOR NEXT SESSION: Review HEP and progress PRN, manual/stretching for shoulder mobility and overhead reach, rotator cuff and postural strengthening; further assessment of hips/knees as needed   Hilda Blades, PT, DPT, LAT, ATC 09/06/21  1:12 PM Phone: [phone] Fax: [phone]

## 2021-09-05 ENCOUNTER — Ambulatory Visit: Payer: PPO | Attending: Internal Medicine | Admitting: Physical Therapy

## 2021-09-05 ENCOUNTER — Other Ambulatory Visit: Payer: Self-pay

## 2021-09-05 ENCOUNTER — Encounter: Payer: Self-pay | Admitting: Physical Therapy

## 2021-09-05 DIAGNOSIS — M25511 Pain in right shoulder: Secondary | ICD-10-CM | POA: Diagnosis not present

## 2021-09-05 DIAGNOSIS — M25552 Pain in left hip: Secondary | ICD-10-CM | POA: Diagnosis not present

## 2021-09-05 DIAGNOSIS — M25551 Pain in right hip: Secondary | ICD-10-CM | POA: Diagnosis not present

## 2021-09-05 DIAGNOSIS — M25512 Pain in left shoulder: Secondary | ICD-10-CM | POA: Diagnosis not present

## 2021-09-05 DIAGNOSIS — M25561 Pain in right knee: Secondary | ICD-10-CM | POA: Diagnosis not present

## 2021-09-05 DIAGNOSIS — M25562 Pain in left knee: Secondary | ICD-10-CM | POA: Insufficient documentation

## 2021-09-05 DIAGNOSIS — G8929 Other chronic pain: Secondary | ICD-10-CM | POA: Diagnosis not present

## 2021-09-05 DIAGNOSIS — M6281 Muscle weakness (generalized): Secondary | ICD-10-CM | POA: Diagnosis not present

## 2021-09-05 DIAGNOSIS — M353 Polymyalgia rheumatica: Secondary | ICD-10-CM | POA: Diagnosis not present

## 2021-09-05 NOTE — Patient Instructions (Signed)
Access Code: [email or web address] URL: [email or web address] Date: 09/05/2021 Prepared by: Hilda Blades  Exercises Standing shoulder flexion wall slides - 2 x daily - 2 sets - 10 reps Shoulder External Rotation with Anchored Resistance - 2 x daily - 2 sets - 10 reps Standing Row with Anchored Resistance - 2 x daily - 2 sets - 10 reps

## 2021-09-12 ENCOUNTER — Ambulatory Visit: Payer: PPO | Admitting: Physical Therapy

## 2021-09-12 ENCOUNTER — Other Ambulatory Visit: Payer: Self-pay

## 2021-09-12 DIAGNOSIS — M6281 Muscle weakness (generalized): Secondary | ICD-10-CM

## 2021-09-12 DIAGNOSIS — G8929 Other chronic pain: Secondary | ICD-10-CM

## 2021-09-12 DIAGNOSIS — M25512 Pain in left shoulder: Secondary | ICD-10-CM

## 2021-09-12 NOTE — Therapy (Signed)
OUTPATIENT PHYSICAL THERAPY TREATMENT NOTE   Patient Name: [name] [name] MRN: [id number] DOB:1940-11-10, 81 y.o., female Today's Date: 09/12/2021  PCP: Binnie Rail, MD REFERRING PROVIDER: Collier Salina, MD   PT End of Session - 09/12/21 1343     Visit Number 2    Number of Visits 8    Date for PT Re-Evaluation 10/31/21    Authorization Type [employer]    Authorization Time Period FOTO by 6th visit    PT Start Time 1315    PT Stop Time 1355    PT Time Calculation (min) 40 min             Past Medical History:  Diagnosis Date   Benign neoplasm of colon    Cataract    bilateral,had surgery   DDD (degenerative disc disease), lumbar    MRI w. [facility] [facility] 05/2015, s/p ESI x 1 w/ improvement   Diaphragmatic hernia without mention of obstruction or gangrene    Diverticulosis of colon (without mention of hemorrhage)    DJD (degenerative joint disease) of knee    L>R, IA cortisone by [facility] in L 03/2015   Esophageal reflux    Gallstones    History of chicken pox    Hyperlipidemia    Hypertension    Irritable bowel syndrome    Migraines    Obesity, unspecified    Osteoporosis, unspecified    Tachycardia    Unspecified hemorrhoids with other complication    Past Surgical History:  Procedure Laterality Date   APPENDECTOMY  1979   CATARACT EXTRACTION     [place]   Patient Active Problem List   Diagnosis Date Noted   PMR (polymyalgia rheumatica) ([place]) - Dr Benjamine Mola 03/19/2021   Bilateral shoulder pain 03/08/2021   Bilateral hip pain 03/08/2021   Positive ANA (antinuclear antibody) 03/08/2021   Myalgia 01/20/2021   Chronic dermatitis 01/17/2021   Urinary incontinence 01/17/2021   Hypertensive disorder 01/17/2021   Aortic atherosclerosis ([place]) 09/17/2020   Lower back pain 11/07/2019   Hearing loss of both ears due to cerumen impaction 07/05/2019   Closed compression fracture  of lumbar vertebra ([place]), T11, T12, L1 12/30/2017   Lung nodules, stable, no f/u needed 12/30/2017   Hypothyroidism 11/01/2016   Constipation 04/30/2016   Prediabetes 11/01/2015   Spinal stenosis of lumbar region 10/03/2015   DDD (degenerative disc disease), lumbar    DJD (degenerative joint disease) of knee    Hypertension 11/25/2010   History of colon polyps 05/14/2009   Arthralgia 02/06/2009   G E R D 08/17/2007   Hyperlipidemia 08/16/2007   HIATAL HERNIA 08/16/2007   DIVERTICULAR DISEASE 08/16/2007   IBS 08/16/2007   Osteoporosis on prolia 08/16/2007   PCP: Binnie Rail, MD   REFERRING PROVIDER: Collier Salina, MD  THERAPY DIAG:  Chronic left shoulder pain  Chronic right shoulder pain  Muscle weakness (generalized)  PERTINENT HISTORY: Polymalgia rheumatica  PRECAUTIONS: None  SUBJECTIVE: Sometimes the shoulders hurt when I am showering. Sometimes my knees get weak when I first get up from sitting. I make sure to steady myself before I start walking.   PAIN:  Are you having pain? No NPRS scale: 0/10 Pain location: Shoulder Pain orientation: Bilateral PAIN TYPE: Chronic Pain description: Intermittent, aching Aggravating factors: Reaching overhead or behind back, lifting Relieving factors: Rest, medication/injections     OBJECTIVE:  DIAGNOSTIC FINDINGS:  X-ray right shoulder: AC joint degenerative arthritis and distal clavicle changes consistent with history of remote fracture no significant bony arthritis of the and humeral joint seen X-ray left shoulder: Suspect calcific tendinopathy, mild glenohumeral joint osteoarthritis change   PATIENT SURVEYS:  FOTO 70% functional status (predicted 67% functional status)   COGNITION: Overall cognitive status: Within functional limits for tasks assessed                               SENSATION:          Light touch: Appears intact   POSTURE: Rounded shoulder and forward head posture   PALPATION: Non  tender to palpation for bilateral shoulders   UPPER EXTREMITY AROM:   AROM Right 09/05/2021 Left 09/05/2021  Shoulder flexion 130 130  Shoulder extension 60 60  Shoulder abduction* 150 150  Shoulder internal rotation T10 T10  Shoulder external rotation T2 T1   *compensated scaption at end range abduction   UPPER EXTREMITY MMT:   MMT Right 09/05/2021 Left 09/05/2021  Shoulder flexion 4+ 4+  Shoulder extension 4 4  Shoulder abduction 4+ 4+  Shoulder internal rotation 5 5  Shoulder external rotation 4 4  Periscapular musculature Grossly 4-    SHOULDER SPECIAL TESTS:           Not assessed   JOINT MOBILITY TESTING:  Grossly [facility]              [facility] Adult PT Treatment:                                                DATE: 09/12/21 Therapeutic Exercise: UBE Level 1 x 2 min each Pulleys for shoulder flexion Wall slide flexion x 10 Banded ER with yellow x 10 x 2 each- mod cues to maintain elbow bend Banded Row with red x 10 x 2- mod cues for scap retract  Supine red band horizontal abduction Supine red band bilat shoulder ER x 15 Wide grip pullovers red band x 10 Manual Therapy: Passive ROM bilat shoulder flexion, abduct, IR and ER to tolerance Balance  Test: Tandem stance >30 sec each SLS 5 sec each      TODAY'S TREATMENT:  Wall slide flexion x 10 Banded ER with yellow x 10 each Banded Row with red x 10   PATIENT EDUCATION: Education details: continue HEP Person educated: Patient Education method: Explanation, Demonstration, Tactile cues, Verbal cues, and Handouts Education comprehension: verbalized understanding, returned demonstration, verbal cues required, tactile cues required, and needs further education   HOME EXERCISE PROGRAM: Access Code: [email or web address]     ASSESSMENT: CLINICAL IMPRESSION: Pt reports compliance with HEP and no shoulder pain on arrival. She required mod cues for correct technique with theraband portion of HEP. Began UBE and continued with ROM and  light strengthening. She tolerated the session well without c/o increased pain. Pt voices concern with her knees feeling fatigued when she first stands up and feels the need to steady herself before walking. Captured initial tandem and SLS times.    REHAB POTENTIAL: Good   CLINICAL DECISION MAKING: Stable/uncomplicated   EVALUATION COMPLEXITY: Low     GOALS: Goals reviewed with patient? Yes   SHORT TERM GOALS:   STG Name Target Date Goal status  1 Patient will be I with  initial HEP in order to progress with therapy. Baseline: provided at eval 10/03/2021 INITIAL    LONG TERM GOALS:    LTG Name Target Date Goal status  1 Patient will be I with final HEP to maintain progress from PT. Baseline: provided at eval 10/31/2021 INITIAL  2 Patient will demonstrate improve shoulder elevation >/= 150 deg in order to improve reach into upper cabinet Baseline: 130 deg 10/31/2021 INITIAL  3 Patient will demonstrate shoulder strength grossly 5/5 and periscapular strength grossly 4/5 MMT to improve lifting ability Baseline: patient demonstrates strength deficits 10/31/2021 INITIAL  4 Patient will report continued pain relief of bilateral shoulders with no limitation in dressing or bathing tasks Baseline: patient with no pain at eval 10/31/2021 INITIAL    Establish more goals for hips/knees as needed          PLAN: PT FREQUENCY: 1x/week   PT DURATION: 8 weeks   PLANNED INTERVENTIONS: Therapeutic exercises, Therapeutic activity, Neuro Muscular re-education, Balance training, Gait training, Patient/Family education, Joint mobilization, Aquatic Therapy, Dry Needling, Cryotherapy, Moist heat, and Manual therapy   PLAN FOR NEXT SESSION: Review HEP and progress PRN, manual/stretching for shoulder mobility and overhead reach, rotator cuff and postural strengthening; further assessment of hips/knees as needed, consider additional balance screening    Hessie Diener, PTA 09/12/21 2:00 PM Phone:  [phone] Fax: [phone]

## 2021-09-19 ENCOUNTER — Other Ambulatory Visit: Payer: Self-pay

## 2021-09-19 ENCOUNTER — Ambulatory Visit: Payer: PPO | Admitting: Physical Therapy

## 2021-09-19 ENCOUNTER — Encounter: Payer: Self-pay | Admitting: Physical Therapy

## 2021-09-19 DIAGNOSIS — G8929 Other chronic pain: Secondary | ICD-10-CM

## 2021-09-19 DIAGNOSIS — M25561 Pain in right knee: Secondary | ICD-10-CM

## 2021-09-19 DIAGNOSIS — M25512 Pain in left shoulder: Secondary | ICD-10-CM | POA: Diagnosis not present

## 2021-09-19 DIAGNOSIS — M25552 Pain in left hip: Secondary | ICD-10-CM

## 2021-09-19 DIAGNOSIS — M6281 Muscle weakness (generalized): Secondary | ICD-10-CM

## 2021-09-19 DIAGNOSIS — M25551 Pain in right hip: Secondary | ICD-10-CM

## 2021-09-19 NOTE — Therapy (Signed)
OUTPATIENT PHYSICAL THERAPY TREATMENT NOTE   Patient Name: [name] [name] MRN: [id number] DOB:21-Dec-1940, 81 y.o., female Today's Date: 09/19/2021  PCP: Binnie Rail, MD REFERRING PROVIDER: Collier Salina, MD   PT End of Session - 09/19/21 1345     Visit Number 3    Number of Visits 8    Date for PT Re-Evaluation 10/31/21    Authorization Type [employer]    Authorization Time Period FOTO by 6th visit    PT Start Time 1350    PT Stop Time 1430    PT Time Calculation (min) 40 min    Activity Tolerance Patient tolerated treatment well    Behavior During Therapy [facility] for tasks assessed/performed              Past Medical History:  Diagnosis Date   Benign neoplasm of colon    Cataract    bilateral,had surgery   DDD (degenerative disc disease), lumbar    MRI w. [facility] 05/2015, s/p ESI x 1 w/ improvement   Diaphragmatic hernia without mention of obstruction or gangrene    Diverticulosis of colon (without mention of hemorrhage)    DJD (degenerative joint disease) of knee    L>R, IA cortisone by [facility] [facility] in L 03/2015   Esophageal reflux    Gallstones    History of chicken pox    Hyperlipidemia    Hypertension    Irritable bowel syndrome    Migraines    Obesity, unspecified    Osteoporosis, unspecified    Tachycardia    Unspecified hemorrhoids with other complication    Past Surgical History:  Procedure Laterality Date   APPENDECTOMY  1979   CATARACT EXTRACTION     [place]   Patient Active Problem List   Diagnosis Date Noted   PMR (polymyalgia rheumatica) ([place]) - Dr Benjamine Mola 03/19/2021   Bilateral shoulder pain 03/08/2021   Bilateral hip pain 03/08/2021   Positive ANA (antinuclear antibody) 03/08/2021   Myalgia 01/20/2021   Chronic dermatitis 01/17/2021   Urinary incontinence 01/17/2021   Hypertensive disorder 01/17/2021   Aortic atherosclerosis ([place]) 09/17/2020    Lower back pain 11/07/2019   Hearing loss of both ears due to cerumen impaction 07/05/2019   Closed compression fracture of lumbar vertebra ([place]), T11, T12, L1 12/30/2017   Lung nodules, stable, no f/u needed 12/30/2017   Hypothyroidism 11/01/2016   Constipation 04/30/2016   Prediabetes 11/01/2015   Spinal stenosis of lumbar region 10/03/2015   DDD (degenerative disc disease), lumbar    DJD (degenerative joint disease) of knee    Hypertension 11/25/2010   History of colon polyps 05/14/2009   Arthralgia 02/06/2009   G E R D 08/17/2007   Hyperlipidemia 08/16/2007   HIATAL HERNIA 08/16/2007   DIVERTICULAR DISEASE 08/16/2007   IBS 08/16/2007   Osteoporosis on prolia 08/16/2007   PCP: Binnie Rail, MD   REFERRING PROVIDER: Collier Salina, MD  THERAPY DIAG:  Chronic left shoulder pain  Chronic right shoulder pain  Muscle weakness (generalized)  Pain in left hip  Pain in right hip  Chronic pain of left knee  Chronic pain of right knee  PERTINENT HISTORY: Polymalgia rheumatica  PRECAUTIONS: None  SUBJECTIVE: She notes that it has been a month since her shoulder injections and they are still doing well. Patient does report that if she has been sitting for extended periods and  goes to get up then her hips will bother her.   PAIN:  Are you having pain? No NPRS scale: 0/10 Pain location: Shoulder Pain orientation: Bilateral PAIN TYPE: Chronic Pain description: Intermittent, aching Aggravating factors: Reaching overhead or behind back, lifting Relieving factors: Rest, medication/injections   OBJECTIVE:  PATIENT SURVEYS:  FOTO 70% functional status (predicted 67% functional status)   POSTURE: Rounded shoulder and forward head posture   UPPER EXTREMITY AROM:   AROM Right 09/05/2021 Left 09/05/2021  Shoulder flexion 130 130  Shoulder extension 60 60  Shoulder abduction* 150 150  Shoulder internal rotation T10 T10  Shoulder external rotation T2 T1    *compensated scaption at end range abduction   UPPER EXTREMITY MMT:   MMT Right 09/05/2021 Left 09/05/2021  Shoulder flexion 4+ 4+  Shoulder extension 4 4  Shoulder abduction 4+ 4+  Shoulder internal rotation 5 5  Shoulder external rotation 4 4  Periscapular musculature Grossly 4-   LOWER  EXTREMITY MMT:  MMT Right 09/05/2021 Left 09/05/2021  Hip  flexion 4 4  Hip extension 4- 4-  Hip abduction 4- 4-  Knee flexion 4 4  Knee extension 4 4     TODAY'S TREATMENT:   09/19/2021: Therapeutic Exercise: NuStep L5 x 5 min with UE/LE while taking subjective Row with red 2 x 20 ER/IR with red x 20 each Wall walk with physioball x 10 Scaption with 1# 2 x 10 SLR 2 x 10 each Bridge 2 x 10 Sidelying hip abduction 2 x 10 each Sit to stand 2 x 10 with hands on thighs   09/12/2021: Therapeutic Exercise: UBE Level 1 x 2 min each Pulleys for shoulder flexion Wall slide flexion x 10 Banded ER with yellow x 10 x 2 each- mod cues to maintain elbow bend Banded Row with red x 10 x 2- mod cues for scap retract  Supine red band horizontal abduction Supine red band bilat shoulder ER x 15 Wide grip pullovers red band x 10 Manual Therapy: Passive ROM bilat shoulder flexion, abduct, IR and ER to tolerance Balance  Test: Tandem stance >30 sec each SLS 5 sec each    09/05/2021: (evaluation) Wall slide flexion x 10 Banded ER with yellow x 10 each Banded Row with red x 10   PATIENT EDUCATION: Education details: HEP update for LE strengthening Person educated: Patient Education method: Explanation, Demonstration, Tactile cues, Verbal cues, and Handouts Education comprehension: verbalized understanding, returned demonstration, verbal cues required, tactile cues required, and needs further education   HOME EXERCISE PROGRAM: Access Code: [email or web address]     ASSESSMENT: CLINICAL IMPRESSION: Patient tolerated therapy well with no adverse effects. She seems to be progressing well with her shoulders and  denies any pain with therapy. She does exhibit gross LE strength deficits so incorporated hip and knee strengthening this visit with good tolerance. Updated HEP to include LE strengthening for home. Patient does require cueing for proper exercise technique and posture. She would benefit from continued skilled PT to progress her mobility and strength in order to reduce pain and maximize functional ability.  Objective impairments include decreased activity tolerance, decreased ROM, decreased strength, postural dysfunction, and pain.     GOALS: Goals reviewed with patient? Yes   SHORT TERM GOALS:   STG Name Target Date Goal status  1 Patient will be I with initial HEP in order to progress with therapy. Baseline: provided at eval 10/03/2021 INITIAL    LONG TERM GOALS:    LTG Name Target Date Goal  status  1 Patient will be I with final HEP to maintain progress from PT. Baseline: provided at eval 10/31/2021 INITIAL  2 Patient will demonstrate improve shoulder elevation >/= 150 deg in order to improve reach into upper cabinet Baseline: 130 deg 10/31/2021 INITIAL  3 Patient will demonstrate shoulder strength grossly 5/5 and periscapular strength grossly 4/5 MMT to improve lifting ability Baseline: patient demonstrates strength deficits 10/31/2021 INITIAL  4 Patient will report continued pain relief of bilateral shoulders with no limitation in dressing or bathing tasks Baseline: patient with no pain at eval 10/31/2021 INITIAL    Establish more goals for hips/knees as needed          PLAN: PT FREQUENCY: 1x/week   PT DURATION: 8 weeks   PLANNED INTERVENTIONS: Therapeutic exercises, Therapeutic activity, Neuro Muscular re-education, Balance training, Gait training, Patient/Family education, Joint mobilization, Aquatic Therapy, Dry Needling, Cryotherapy, Moist heat, and Manual therapy   PLAN FOR NEXT SESSION: Review HEP and progress PRN, manual/stretching for shoulder mobility and overhead reach, rotator  cuff and postural strengthening; consider additional balance screening    Hilda Blades, PT, DPT, LAT, ATC 09/19/21  2:31 PM Phone: [phone] Fax: [phone]

## 2021-09-19 NOTE — Patient Instructions (Signed)
Access Code: [email or web address] URL: [email or web address] Date: 09/19/2021 Prepared by: Hilda Blades  Exercises Standing shoulder flexion wall slides - 1 x daily - 2 sets - 10 reps Shoulder External Rotation with Anchored Resistance - 1 x daily - 2 sets - 10 reps Standing Row with Anchored Resistance - 1 x daily - 2 sets - 10 reps Active Straight Leg Raise with Quad Set - 1 x daily - 2 sets - 10 reps Bridge - 1 x daily - 2 sets - 10 reps Sidelying Hip Abduction - 1 x daily - 2 sets - 10 reps Sit to Stand - 1 x daily - 2 sets - 10 reps

## 2021-09-23 NOTE — Therapy (Signed)
OUTPATIENT PHYSICAL THERAPY TREATMENT NOTE   Patient Name: [name] [name] MRN: [id number] DOB:1941/06/07, 81 y.o., female Today's Date: 09/26/2021  PCP: Binnie Rail, MD REFERRING PROVIDER: Collier Salina, MD   PT End of Session - 09/26/21 1529     Visit Number 4    Number of Visits 8    Date for PT Re-Evaluation 10/31/21    Authorization Type [employer]    Authorization Time Period FOTO by 6th visit    PT Start Time 1530    PT Stop Time 1610    PT Time Calculation (min) 40 min    Activity Tolerance Patient tolerated treatment well    Behavior During Therapy [facility] for tasks assessed/performed               Past Medical History:  Diagnosis Date   Benign neoplasm of colon    Cataract    bilateral,had surgery   DDD (degenerative disc disease), lumbar    MRI w. [facility] 05/2015, s/p ESI x 1 w/ improvement   Diaphragmatic hernia without mention of obstruction or gangrene    Diverticulosis of colon (without mention of hemorrhage)    DJD (degenerative joint disease) of knee    L>R, IA cortisone by [facility] [facility] in L 03/2015   Esophageal reflux    Gallstones    History of chicken pox    Hyperlipidemia    Hypertension    Irritable bowel syndrome    Migraines    Obesity, unspecified    Osteoporosis, unspecified    Tachycardia    Unspecified hemorrhoids with other complication    Past Surgical History:  Procedure Laterality Date   APPENDECTOMY  1979   CATARACT EXTRACTION     [place]   Patient Active Problem List   Diagnosis Date Noted   PMR (polymyalgia rheumatica) ([place]) - Dr Benjamine Mola 03/19/2021   Bilateral shoulder pain 03/08/2021   Bilateral hip pain 03/08/2021   Positive ANA (antinuclear antibody) 03/08/2021   Myalgia 01/20/2021   Chronic dermatitis 01/17/2021   Urinary incontinence 01/17/2021   Hypertensive disorder 01/17/2021   Aortic atherosclerosis ([place]) 09/17/2020    Lower back pain 11/07/2019   Closed compression fracture of lumbar vertebra ([place]), T11, T12, L1 12/30/2017   Lung nodules, stable, no f/u needed 12/30/2017   Hypothyroidism 11/01/2016   Constipation 04/30/2016   Prediabetes 11/01/2015   Spinal stenosis of lumbar region 10/03/2015   DDD (degenerative disc disease), lumbar    DJD (degenerative joint disease) of knee    Hypertension 11/25/2010   History of colon polyps 05/14/2009   Arthralgia 02/06/2009   G E R D 08/17/2007   Hyperlipidemia 08/16/2007   HIATAL HERNIA 08/16/2007   DIVERTICULAR DISEASE 08/16/2007   IBS 08/16/2007   Osteoporosis on prolia 08/16/2007   PCP: Binnie Rail, MD   REFERRING PROVIDER: Collier Salina, MD  THERAPY DIAG:  Chronic left shoulder pain  Chronic right shoulder pain  Muscle weakness (generalized)  Pain in left hip  Pain in right hip  Chronic pain of left knee  Chronic pain of right knee  PERTINENT HISTORY: Polymalgia rheumatica  PRECAUTIONS: None  SUBJECTIVE: Patient reports she is doing very well. She is consistent with her exercise program.  PAIN:  Are you having pain? No NPRS scale: 0/10 Pain location: Shoulder Pain orientation: Bilateral PAIN TYPE: Chronic Pain description: Intermittent, aching Aggravating factors: Reaching overhead  or behind back, lifting Relieving factors: Rest, medication/injections   OBJECTIVE:  PATIENT SURVEYS:  FOTO 70% functional status (predicted 67% functional status)   POSTURE: Rounded shoulder and forward head posture   UPPER EXTREMITY AROM:   AROM Right 09/05/2021 Left 09/05/2021  Shoulder flexion 130 130  Shoulder extension 60 60  Shoulder abduction* 150 150  Shoulder internal rotation T10 T10  Shoulder external rotation T2 T1   *compensated scaption at end range abduction   UPPER EXTREMITY MMT:   MMT Right 09/05/2021 Left 09/05/2021  Shoulder flexion 4+ 4+  Shoulder extension 4 4  Shoulder abduction 4+ 4+  Shoulder internal  rotation 5 5  Shoulder external rotation 4 4  Periscapular musculature Grossly 4-   LOWER  EXTREMITY MMT:  MMT Right 09/19/2021 Left 09/19/2021  Hip  flexion 4 4  Hip extension 4- 4-  Hip abduction 4- 4-  Knee flexion 4 4  Knee extension 4 4    TODAY'S TREATMENT:  09/26/2021: Therapeutic Exercise: NuStep L5 x 5 min with UE/LE while taking subjective SLR 2 x 15 each Bridge 2 x 10 with 3 sec hold Sidelying hip abduction 2 x 15 each LAQ 3# 2 x 15 each Sit to stand holding 10# at chest 2 x 10 Supine horizontal abduction with red 2 x 15 Seated double ER and scap retraction with red 2 x 15 Standing scaption with 1# 2 x 10 Row with green 2 x 20 Wall walk with physioball shoulder flexion 10 x 5 sec    09/19/2021: Therapeutic Exercise: NuStep L5 x 5 min with UE/LE while taking subjective Row with red 2 x 20 ER/IR with red x 20 each Wall walk with physioball x 10 Scaption with 1# 2 x 10 SLR 2 x 10 each Bridge 2 x 10 Sidelying hip abduction 2 x 10 each Sit to stand 2 x 10 with hands on thighs  09/12/2021: Therapeutic Exercise: UBE Level 1 x 2 min each Pulleys for shoulder flexion Wall slide flexion x 10 Banded ER with yellow x 10 x 2 each- mod cues to maintain elbow bend Banded Row with red x 10 x 2- mod cues for scap retract  Supine red band horizontal abduction Supine red band bilat shoulder ER x 15 Wide grip pullovers red band x 10 Manual Therapy: Passive ROM bilat shoulder flexion, abduct, IR and ER to tolerance Balance  Test: Tandem stance >30 sec each SLS 5 sec each    PATIENT EDUCATION: Education details: HEP Person educated: Patient Education method: Consulting civil engineer, Demonstration, Corporate treasurer cues, Verbal cues Education comprehension: verbalized understanding, returned demonstration, verbal cues required, tactile cues required, and needs further education   HOME EXERCISE PROGRAM: Access Code: [email or web address]     ASSESSMENT: CLINICAL IMPRESSION: Patient tolerated  therapy well with no adverse effects. Therapy focused on progression of LE and UE strengthening with good tolerance. She was able to tolerate high repetitions and resistance level with exercises this visit. Occasional cueing for proper exercise technique. No pain reported with therapy. No changes made to HEP this visit. She would benefit from continued skilled PT to progress her mobility and strength in order to reduce pain and maximize functional ability.  Objective impairments include decreased activity tolerance, decreased ROM, decreased strength, postural dysfunction, and pain.     GOALS: Goals reviewed with patient? Yes   SHORT TERM GOALS:   STG Name Target Date Goal status  1 Patient will be I with initial HEP in order to progress with therapy. Baseline: provided at  eval 09/26/2021: patient independent with HEP 10/03/2021 ACHIEVED    LONG TERM GOALS:    LTG Name Target Date Goal status  1 Patient will be I with final HEP to maintain progress from PT. Baseline: provided at eval 10/31/2021 INITIAL  2 Patient will demonstrate improve shoulder elevation >/= 150 deg in order to improve reach into upper cabinet Baseline: 130 deg 10/31/2021 INITIAL  3 Patient will demonstrate shoulder strength grossly 5/5 and periscapular strength grossly 4/5 MMT to improve lifting ability Baseline: patient demonstrates strength deficits 10/31/2021 INITIAL  4 Patient will report continued pain relief of bilateral shoulders with no limitation in dressing or bathing tasks Baseline: patient with no pain at eval 10/31/2021 INITIAL    Establish more goals for hips/knees as needed          PLAN: PT FREQUENCY: 1x/week   PT DURATION: 8 weeks   PLANNED INTERVENTIONS: Therapeutic exercises, Therapeutic activity, Neuro Muscular re-education, Balance training, Gait training, Patient/Family education, Joint mobilization, Aquatic Therapy, Dry Needling, Cryotherapy, Moist heat, and Manual therapy   PLAN FOR NEXT SESSION:  Review HEP and progress PRN, manual/stretching for shoulder mobility and overhead reach, rotator cuff and postural strengthening; consider additional balance screening    Hilda Blades, PT, DPT, LAT, ATC 09/26/21  4:11 PM Phone: [phone] Fax: [phone]

## 2021-09-24 NOTE — Progress Notes (Signed)
? ? ? ? ?Subjective:  ? ? Patient ID: [name] [name], female    DOB: May 02, 1941, 81 y.o.   MRN: [id number] ? ?This visit occurred during the SARS-CoV-2 public health emergency.  Safety protocols were in place, including screening questions prior to the visit, additional usage of staff PPE, and extensive cleaning of exam room while observing appropriate contact time as indicated for disinfecting solutions.   ? ? ?HPI ?[name] [name] is here for follow up of her chronic medical problems, including HTN, HLD, GERD, hypothyroidism, osteoporosis on Prolia, prediabetes, PMR-Dr. Benjamine Mola ? ?She is doing PT now.  Still has achiness form PMR.   ? ?Medications and allergies reviewed with patient and updated if appropriate. ? ?Current Outpatient Medications on File Prior to Visit  ?Medication Sig Dispense Refill  ? acetaminophen (TYLENOL) 650 MG CR tablet Take 650 mg by mouth every 8 (eight) hours as needed for pain.    ? Biotin 5 MG CAPS Taking daily  0  ? bisoprolol-hydrochlorothiazide (ZIAC) 5-6.25 MG tablet TAKE 1 TABLET BY MOUTH EVERY DAY 90 tablet 1  ? CALCIUM CITRATE-VITAMIN D PO Take 600 mg by mouth in the morning and at bedtime.    ? denosumab (PROLIA) 60 MG/ML SOLN injection Inject 60 mg into the skin every 6 (six) months. Administer in upper arm, thigh, or abdomen    ? famotidine (PEPCID) 20 MG tablet Take 20 mg by mouth daily.    ? levothyroxine (SYNTHROID) 50 MCG tablet Take 1 tablet (50 mcg total) by mouth daily. 90 tablet 3  ? Multiple Vitamins-Minerals (CENTRUM SILVER PO) Take by mouth daily.    ? omeprazole (PRILOSEC) 20 MG capsule TAKE 1 CAPSULE BY MOUTH EVERY DAY (Patient taking differently: Take 20 mg by mouth every other day.) 90 capsule 3  ? polyethylene glycol (MIRALAX / GLYCOLAX) packet Take 17 g by mouth daily. 2 capfuls daily    ? pravastatin (PRAVACHOL) 40 MG tablet TAKE 1 TABLET BY MOUTH EVERY DAY 90 tablet 0  ? PROLIA 60 MG/ML SOSY injection INJECT 60 MG INTO THE SKIN ONCE FOR 1 DOSE. 60 mL 1  ? Wheat Dextrin  (BENEFIBER) POWD Twice daily  0  ? ?No current facility-administered medications on file prior to visit.  ? ? ? ?Review of Systems  ?Constitutional:  Negative for chills and fever.  ?Respiratory:  Negative for cough, shortness of breath and wheezing.   ?Cardiovascular:  Negative for chest pain, palpitations and leg swelling.  ?Neurological:  Negative for light-headedness and headaches.  ? ?   ?Objective:  ?There were no vitals filed for this visit. ?BP Readings from Last 3 Encounters:  ?08/19/21 136/74  ?05/30/21 130/79  ?04/24/21 (!) 146/71  ? ?Wt Readings from Last 3 Encounters:  ?09/25/21 136 lb 3.2 oz (61.8 kg)  ?08/19/21 143 lb 6.4 oz (65 kg)  ?05/30/21 139 lb (63 kg)  ? ?Body mass index is 26.6 kg/m?. ? ?  ?Physical Exam ?   ? ?Lab Results  ?Component Value Date  ? WBC 11.0 (H) 01/17/2021  ? HGB 13.7 01/17/2021  ? HCT 40.3 01/17/2021  ? PLT 270.0 01/17/2021  ? GLUCOSE 90 03/20/2021  ? CHOL 207 (H) 09/17/2020  ? TRIG 176.0 (H) 09/17/2020  ? HDL 55.70 09/17/2020  ? LDLDIRECT 158.0 06/27/2015  ? LDLCALC 116 (H) 09/17/2020  ? ALT 18 03/20/2021  ? AST 18 03/20/2021  ? NA 140 03/20/2021  ? K 4.7 03/20/2021  ? CL 100 03/20/2021  ? CREATININE 0.75 03/20/2021  ?  BUN 18 03/20/2021  ? CO2 33 (H) 03/20/2021  ? TSH 5.34 (H) 01/17/2021  ? HGBA1C 6.0 03/20/2021  ? ? ? ?Assessment & Plan:  ? ? ?See Problem List for Assessment and Plan of chronic medical problems.  ? ? ?

## 2021-09-24 NOTE — Patient Instructions (Addendum)
? ? ? ?  Blood work was ordered.   ? ? ?Medications changes include :   none   ? ? ? ? ?Return in about 6 months (around 03/28/2022) for CPE. ? ?

## 2021-09-25 ENCOUNTER — Ambulatory Visit (INDEPENDENT_AMBULATORY_CARE_PROVIDER_SITE_OTHER): Payer: PPO | Admitting: Internal Medicine

## 2021-09-25 ENCOUNTER — Encounter: Payer: Self-pay | Admitting: Internal Medicine

## 2021-09-25 ENCOUNTER — Other Ambulatory Visit: Payer: Self-pay

## 2021-09-25 VITALS — BP 130/70 | HR 71 | Temp 98.5°F | Ht 60.0 in | Wt 136.2 lb

## 2021-09-25 DIAGNOSIS — E7849 Other hyperlipidemia: Secondary | ICD-10-CM | POA: Diagnosis not present

## 2021-09-25 DIAGNOSIS — E038 Other specified hypothyroidism: Secondary | ICD-10-CM

## 2021-09-25 DIAGNOSIS — I10 Essential (primary) hypertension: Secondary | ICD-10-CM

## 2021-09-25 DIAGNOSIS — K219 Gastro-esophageal reflux disease without esophagitis: Secondary | ICD-10-CM

## 2021-09-25 DIAGNOSIS — M810 Age-related osteoporosis without current pathological fracture: Secondary | ICD-10-CM | POA: Diagnosis not present

## 2021-09-25 DIAGNOSIS — I700 Atherosclerosis of aorta: Secondary | ICD-10-CM | POA: Diagnosis not present

## 2021-09-25 DIAGNOSIS — E063 Autoimmune thyroiditis: Secondary | ICD-10-CM

## 2021-09-25 DIAGNOSIS — R7303 Prediabetes: Secondary | ICD-10-CM | POA: Diagnosis not present

## 2021-09-25 LAB — CBC WITH DIFFERENTIAL/PLATELET
Basophils Absolute: 0.1 10*3/uL (ref 0.0–0.1)
Basophils Relative: 0.8 % (ref 0.0–3.0)
Eosinophils Absolute: 0.1 10*3/uL (ref 0.0–0.7)
Eosinophils Relative: 1 % (ref 0.0–5.0)
HCT: 41.6 % (ref 36.0–46.0)
Hemoglobin: 13.6 g/dL (ref 12.0–15.0)
Lymphocytes Relative: 21.4 % (ref 12.0–46.0)
Lymphs Abs: 1.8 10*3/uL (ref 0.7–4.0)
MCHC: 32.6 g/dL (ref 30.0–36.0)
MCV: 90 fl (ref 78.0–100.0)
Monocytes Absolute: 1 10*3/uL (ref 0.1–1.0)
Monocytes Relative: 11.4 % (ref 3.0–12.0)
Neutro Abs: 5.6 10*3/uL (ref 1.4–7.7)
Neutrophils Relative %: 65.4 % (ref 43.0–77.0)
Platelets: 262 10*3/uL (ref 150.0–400.0)
RBC: 4.62 Mil/uL (ref 3.87–5.11)
RDW: 14.1 % (ref 11.5–15.5)
WBC: 8.6 10*3/uL (ref 4.0–10.5)

## 2021-09-25 LAB — COMPREHENSIVE METABOLIC PANEL
ALT: 14 U/L (ref 0–35)
AST: 17 U/L (ref 0–37)
Albumin: 4.1 g/dL (ref 3.5–5.2)
Alkaline Phosphatase: 36 U/L — ABNORMAL LOW (ref 39–117)
BUN: 20 mg/dL (ref 6–23)
CO2: 33 mEq/L — ABNORMAL HIGH (ref 19–32)
Calcium: 9.9 mg/dL (ref 8.4–10.5)
Chloride: 101 mEq/L (ref 96–112)
Creatinine, Ser: 0.7 mg/dL (ref 0.40–1.20)
GFR: 80.5 mL/min (ref >60.00)
Glucose, Bld: 87 mg/dL (ref 70–99)
Potassium: 3.8 mEq/L (ref 3.5–5.1)
Sodium: 140 mEq/L (ref 135–145)
Total Bilirubin: 1.3 mg/dL — ABNORMAL HIGH (ref 0.2–1.2)
Total Protein: 7.1 g/dL (ref 6.0–8.3)

## 2021-09-25 LAB — LIPID PANEL
Cholesterol: 213 mg/dL — ABNORMAL HIGH (ref 0–200)
HDL: 59.8 mg/dL (ref >39.00)
LDL Cholesterol: 124 mg/dL — ABNORMAL HIGH (ref 0–99)
NonHDL: 153.29
Total CHOL/HDL Ratio: 4
Triglycerides: 148 mg/dL (ref 0.0–149.0)
VLDL: 29.6 mg/dL (ref 0.0–40.0)

## 2021-09-25 LAB — VITAMIN D 25 HYDROXY (VIT D DEFICIENCY, FRACTURES): VITD: 54.14 ng/mL (ref 30.00–100.00)

## 2021-09-25 LAB — TSH: TSH: 1.15 u[IU]/mL (ref 0.35–5.50)

## 2021-09-25 LAB — HEMOGLOBIN A1C: Hgb A1c MFr Bld: 5.9 % (ref 4.6–6.5)

## 2021-09-25 NOTE — Assessment & Plan Note (Signed)
Chronic  Clinically euthyroid Currently taking levothyroxine 50 mcg daily Check tsh  Titrate med dose if needed  

## 2021-09-25 NOTE — Assessment & Plan Note (Signed)
Chronic Continue pravastatin 40 mg daily 

## 2021-09-25 NOTE — Assessment & Plan Note (Signed)
Chronic Regular exercise and healthy diet encouraged Check lipid panel  Continue pravastatin 40 mg daily 

## 2021-09-25 NOTE — Assessment & Plan Note (Addendum)
Chronic ?GERD controlled ?Continue omeprazole 20 mg daily alternating with pepcid daily ?

## 2021-09-25 NOTE — Assessment & Plan Note (Signed)
Chronic Check a1c Low sugar / carb diet Stressed regular exercise  

## 2021-09-25 NOTE — Assessment & Plan Note (Signed)
Chronic ?On Prolia every 6 months  we will continue  ?DEXA due later this year so we can reevaluate ?Encouraged regular exercise-weightbearing, weights ?Continue calcium and vitamin D ?Check vitamin D level ?

## 2021-09-25 NOTE — Assessment & Plan Note (Signed)
Chronic Blood pressure well controlled CMP Continue bisoprolol-HCTZ 5-6.25 mg daily 

## 2021-09-26 ENCOUNTER — Ambulatory Visit: Payer: PPO | Attending: Internal Medicine | Admitting: Physical Therapy

## 2021-09-26 ENCOUNTER — Other Ambulatory Visit: Payer: Self-pay

## 2021-09-26 ENCOUNTER — Encounter: Payer: Self-pay | Admitting: Physical Therapy

## 2021-09-26 DIAGNOSIS — M25511 Pain in right shoulder: Secondary | ICD-10-CM | POA: Diagnosis not present

## 2021-09-26 DIAGNOSIS — M25552 Pain in left hip: Secondary | ICD-10-CM | POA: Diagnosis not present

## 2021-09-26 DIAGNOSIS — M25561 Pain in right knee: Secondary | ICD-10-CM | POA: Diagnosis not present

## 2021-09-26 DIAGNOSIS — M25551 Pain in right hip: Secondary | ICD-10-CM | POA: Diagnosis not present

## 2021-09-26 DIAGNOSIS — M6281 Muscle weakness (generalized): Secondary | ICD-10-CM | POA: Diagnosis not present

## 2021-09-26 DIAGNOSIS — G8929 Other chronic pain: Secondary | ICD-10-CM | POA: Insufficient documentation

## 2021-09-26 DIAGNOSIS — M25512 Pain in left shoulder: Secondary | ICD-10-CM | POA: Diagnosis not present

## 2021-09-26 DIAGNOSIS — M25562 Pain in left knee: Secondary | ICD-10-CM | POA: Diagnosis not present

## 2021-09-28 ENCOUNTER — Other Ambulatory Visit: Payer: Self-pay | Admitting: Internal Medicine

## 2021-09-28 DIAGNOSIS — E7849 Other hyperlipidemia: Secondary | ICD-10-CM

## 2021-09-28 DIAGNOSIS — I700 Atherosclerosis of aorta: Secondary | ICD-10-CM

## 2021-10-01 NOTE — Therapy (Signed)
OUTPATIENT PHYSICAL THERAPY TREATMENT NOTE   Patient Name: [name] [name] MRN: [id number] DOB:Dec 14, 1940, 81 y.o., female Today's Date: 10/03/2021  PCP: Binnie Rail, MD REFERRING PROVIDER: Collier Salina, MD   PT End of Session - 10/03/21 1540     Visit Number 5    Number of Visits 8    Date for PT Re-Evaluation 10/31/21    Authorization Type [employer]    Authorization Time Period FOTO by 6th visit    PT Start Time 1530    PT Stop Time 1615    PT Time Calculation (min) 45 min    Activity Tolerance Patient tolerated treatment well    Behavior During Therapy [facility] for tasks assessed/performed                Past Medical History:  Diagnosis Date   Benign neoplasm of colon    Cataract    bilateral,had surgery   DDD (degenerative disc disease), lumbar    MRI w. [place] [facility] 05/2015, s/p ESI x 1 w/ improvement   Diaphragmatic hernia without mention of obstruction or gangrene    Diverticulosis of colon (without mention of hemorrhage)    DJD (degenerative joint disease) of knee    L>R, IA cortisone by [place] [facility] in L 03/2015   Esophageal reflux    Gallstones    History of chicken pox    Hyperlipidemia    Hypertension    Irritable bowel syndrome    Migraines    Obesity, unspecified    Osteoporosis, unspecified    Tachycardia    Unspecified hemorrhoids with other complication    Past Surgical History:  Procedure Laterality Date   APPENDECTOMY  1979   CATARACT EXTRACTION     [place]   Patient Active Problem List   Diagnosis Date Noted   PMR (polymyalgia rheumatica) ([place]) - Dr Benjamine Mola 03/19/2021   Bilateral shoulder pain 03/08/2021   Bilateral hip pain 03/08/2021   Positive ANA (antinuclear antibody) 03/08/2021   Myalgia 01/20/2021   Chronic dermatitis 01/17/2021   Urinary incontinence 01/17/2021   Hypertensive disorder 01/17/2021   Aortic atherosclerosis ([place]) 09/17/2020    Lower back pain 11/07/2019   Closed compression fracture of lumbar vertebra ([place]), T11, T12, L1 12/30/2017   Lung nodules, stable, no f/u needed 12/30/2017   Hypothyroidism 11/01/2016   Constipation 04/30/2016   Prediabetes 11/01/2015   Spinal stenosis of lumbar region 10/03/2015   DDD (degenerative disc disease), lumbar    DJD (degenerative joint disease) of knee    Hypertension 11/25/2010   History of colon polyps 05/14/2009   Arthralgia 02/06/2009   G E R D 08/17/2007   Hyperlipidemia 08/16/2007   HIATAL HERNIA 08/16/2007   DIVERTICULAR DISEASE 08/16/2007   IBS 08/16/2007   Osteoporosis on prolia 08/16/2007   PCP: Binnie Rail, MD   REFERRING PROVIDER: Collier Salina, MD  THERAPY DIAG:  Chronic left shoulder pain  Chronic right shoulder pain  Muscle weakness (generalized)  Pain in left hip  Pain in right hip  Chronic pain of left knee  Chronic pain of right knee  PERTINENT HISTORY: Polymalgia rheumatica  PRECAUTIONS: None  SUBJECTIVE: Patient reports she continues to do well. She is thinking of doing some things around her house in order to work her legs and shoulders.  PAIN:  Are you having pain? No NPRS scale: 0/10 Pain location: Shoulder Pain orientation:  Bilateral PAIN TYPE: Chronic Pain description: Intermittent, aching Aggravating factors: Reaching overhead or behind back, lifting Relieving factors: Rest, medication/injections   OBJECTIVE:  PATIENT SURVEYS:  FOTO 70% functional status (predicted 67% functional status)   POSTURE: Rounded shoulder and forward head posture   UPPER EXTREMITY AROM:   AROM Right 09/05/2021 Left 09/05/2021  Shoulder flexion 130 130  Shoulder extension 60 60  Shoulder abduction* 150 150  Shoulder internal rotation T10 T10  Shoulder external rotation T2 T1   *compensated scaption at end range abduction   UPPER EXTREMITY MMT:   MMT Right 09/05/2021 Left 09/05/2021  Shoulder flexion 4+ 4+  Shoulder  extension 4 4  Shoulder abduction 4+ 4+  Shoulder internal rotation 5 5  Shoulder external rotation 4 4  Periscapular musculature Grossly 4-   LOWER  EXTREMITY MMT:  MMT Right 09/19/2021 Left 09/19/2021 Rt / Lt 10/03/2021  Hip  flexion 4 4   Hip extension 4- 4-   Hip abduction 4- 4-   Knee flexion '4 4 5 '$ / 5  Knee extension '4 4 5 '$ / 5    TODAY'S TREATMENT:  10/03/2021: Therapeutic Exercise: NuStep L5 x 5 min with UE/LE while taking subjective SLR 2 x 10 with 2# each Sidelying hip abduction 2 x 10 with 2# each LAQ 3# 2 x 15 each Seated hamstring curl with green 2 x 15 each Seated double ER and scap retraction with red 2 x 15 Sit to stand holding 10# at chest 2 x 10 Forward step-up 6" box x 15 each Lateral step-up 6" box x 15 each Row with green 2 x 20 Deadlift 25# 2 x 10   09/26/2021: Therapeutic Exercise: NuStep L5 x 5 min with UE/LE while taking subjective SLR 2 x 15 each Bridge 2 x 10 with 3 sec hold Sidelying hip abduction 2 x 15 each LAQ 3# 2 x 15 each Sit to stand holding 10# at chest 2 x 10 Supine horizontal abduction with red 2 x 15 Seated double ER and scap retraction with red 2 x 15 Standing scaption with 1# 2 x 10 Row with green 2 x 20 Wall walk with physioball shoulder flexion 10 x 5 sec   09/19/2021: Therapeutic Exercise: NuStep L5 x 5 min with UE/LE while taking subjective Row with red 2 x 20 ER/IR with red x 20 each Wall walk with physioball x 10 Scaption with 1# 2 x 10 SLR 2 x 10 each Bridge 2 x 10 Sidelying hip abduction 2 x 10 each Sit to stand 2 x 10 with hands on thighs   PATIENT EDUCATION: Education details: HEP Person educated: Patient Education method: Consulting civil engineer, Demonstration, Corporate treasurer cues, Verbal cues Education comprehension: verbalized understanding, returned demonstration, verbal cues required, tactile cues required, and needs further education   HOME EXERCISE PROGRAM: Access Code: [email or web address]     ASSESSMENT: CLINICAL  IMPRESSION: Patient tolerated therapy well with no adverse effects. Therapy focused on continued strength progression for both UE and LE. Patient seems to be responding well with therapy and is tolerating progressions in strengthening exercises well without any increase in pain. Incorporated more standing exercises for LE strengthening and deadlift to improve lifting ability this visit. No changes to HEP. She would benefit from continued skilled PT to progress her mobility and strength in order to reduce pain and maximize functional ability.  Objective impairments include decreased activity tolerance, decreased ROM, decreased strength, postural dysfunction, and pain.     GOALS: Goals reviewed with patient? Yes   SHORT  TERM GOALS:   STG Name Target Date Goal status  1 Patient will be I with initial HEP in order to progress with therapy. Baseline: provided at eval 09/26/2021: patient independent with HEP 10/03/2021 ACHIEVED    LONG TERM GOALS:    LTG Name Target Date Goal status  1 Patient will be I with final HEP to maintain progress from PT. Baseline: provided at eval 10/31/2021 INITIAL  2 Patient will demonstrate improve shoulder elevation >/= 150 deg in order to improve reach into upper cabinet Baseline: 130 deg 10/31/2021 INITIAL  3 Patient will demonstrate shoulder strength grossly 5/5 and periscapular strength grossly 4/5 MMT to improve lifting ability Baseline: patient demonstrates strength deficits 10/31/2021 INITIAL  4 Patient will report continued pain relief of bilateral shoulders with no limitation in dressing or bathing tasks Baseline: patient with no pain at eval 10/31/2021 INITIAL    Establish more goals for hips/knees as needed          PLAN: PT FREQUENCY: 1x/week   PT DURATION: 8 weeks   PLANNED INTERVENTIONS: Therapeutic exercises, Therapeutic activity, Neuro Muscular re-education, Balance training, Gait training, Patient/Family education, Joint mobilization, Aquatic Therapy,  Dry Needling, Cryotherapy, Moist heat, and Manual therapy   PLAN FOR NEXT SESSION: Review HEP and progress PRN, manual/stretching for shoulder mobility and overhead reach, rotator cuff and postural strengthening; consider additional balance screening    Hilda Blades, PT, DPT, LAT, ATC 10/03/21  4:21 PM Phone: [phone] Fax: [phone]

## 2021-10-02 NOTE — Telephone Encounter (Signed)
Prolia VOB initiated via [email or web address] ? ?Last OV:  ?Next OV:  ?Last Prolia inj: 05/01/21 ?Next Prolia inj DUE: 10/31/21 ? ?

## 2021-10-03 ENCOUNTER — Ambulatory Visit: Payer: PPO | Admitting: Physical Therapy

## 2021-10-03 ENCOUNTER — Encounter: Payer: Self-pay | Admitting: Physical Therapy

## 2021-10-03 ENCOUNTER — Other Ambulatory Visit: Payer: Self-pay

## 2021-10-03 DIAGNOSIS — M25512 Pain in left shoulder: Secondary | ICD-10-CM | POA: Diagnosis not present

## 2021-10-03 DIAGNOSIS — M6281 Muscle weakness (generalized): Secondary | ICD-10-CM

## 2021-10-03 DIAGNOSIS — M25552 Pain in left hip: Secondary | ICD-10-CM

## 2021-10-03 DIAGNOSIS — M25562 Pain in left knee: Secondary | ICD-10-CM

## 2021-10-03 DIAGNOSIS — G8929 Other chronic pain: Secondary | ICD-10-CM

## 2021-10-03 DIAGNOSIS — M25551 Pain in right hip: Secondary | ICD-10-CM

## 2021-10-15 NOTE — Therapy (Signed)
?OUTPATIENT PHYSICAL THERAPY TREATMENT NOTE ? ? ?Patient Name: [name] [name] ?MRN: [id number] ?DOB:Oct 10, 1940, 81 y.o., female ?Today's Date: 10/17/2021 ? ?PCP: Binnie Rail, MD ?REFERRING PROVIDER: Binnie Rail, MD ? ? PT End of Session - 10/17/21 1350   ? ? Visit Number 6   ? Number of Visits 8   ? Date for PT Re-Evaluation 10/31/21   ? Authorization Type [employer]   ? PT Start Time 1355   ? PT Stop Time 1435   ? PT Time Calculation (min) 40 min   ? Activity Tolerance Patient tolerated treatment well   ? Behavior During Therapy [facility] for tasks assessed/performed   ? ?  ?  ? ?  ? ? ? ? ? ? ?Past Medical History:  ?Diagnosis Date  ? Benign neoplasm of colon   ? Cataract   ? bilateral,had surgery  ? DDD (degenerative disc disease), lumbar   ? MRI w. [place] [facility] 05/2015, s/p ESI x 1 w/ improvement  ? Diaphragmatic hernia without mention of obstruction or gangrene   ? Diverticulosis of colon (without mention of hemorrhage)   ? DJD (degenerative joint disease) of knee   ? L>R, [place] cortisone by [place] [facility] in L 03/2015  ? Esophageal reflux   ? Gallstones   ? History of chicken pox   ? Hyperlipidemia   ? Hypertension   ? Irritable bowel syndrome   ? Migraines   ? Obesity, unspecified   ? Osteoporosis, unspecified   ? Tachycardia   ? Unspecified hemorrhoids with other complication   ? ?Past Surgical History:  ?Procedure Laterality Date  ? APPENDECTOMY  1979  ? CATARACT EXTRACTION    ? CHOLECYSTECTOMY  1989  ? COLONOSCOPY    ? POLYPECTOMY    ? VAGINAL HYSTERECTOMY  1996  ? ?Patient Active Problem List  ? Diagnosis Date Noted  ? PMR (polymyalgia rheumatica) ([facility]) - Dr Benjamine Mola 03/19/2021  ? Bilateral shoulder pain 03/08/2021  ? Bilateral hip pain 03/08/2021  ? Positive ANA (antinuclear antibody) 03/08/2021  ? Myalgia 01/20/2021  ? Chronic dermatitis 01/17/2021  ? Urinary incontinence 01/17/2021  ? Hypertensive disorder 01/17/2021  ? Aortic atherosclerosis ([place]) 09/17/2020  ? Lower back pain 11/07/2019  ? Closed compression  fracture of lumbar vertebra ([place]), T11, T12, L1 12/30/2017  ? Lung nodules, stable, no f/u needed 12/30/2017  ? Hypothyroidism 11/01/2016  ? Constipation 04/30/2016  ? Prediabetes 11/01/2015  ? Spinal stenosis of lumbar region 10/03/2015  ? DDD (degenerative disc disease), lumbar   ? DJD (degenerative joint disease) of knee   ? Hypertension 11/25/2010  ? History of colon polyps 05/14/2009  ? Arthralgia 02/06/2009  ? G E R D 08/17/2007  ? Hyperlipidemia 08/16/2007  ? HIATAL HERNIA 08/16/2007  ? DIVERTICULAR DISEASE 08/16/2007  ? IBS 08/16/2007  ? Osteoporosis on prolia 08/16/2007  ? ?PCP: Binnie Rail, MD ?  ?REFERRING PROVIDER: Collier Salina, MD ? ?THERAPY DIAG:  ?Chronic left shoulder pain ? ?Chronic right shoulder pain ? ?Muscle weakness (generalized) ? ?Pain in left hip ? ?Pain in right hip ? ?Chronic pain of left knee ? ?Chronic pain of right knee ? ?PERTINENT HISTORY: Polymalgia rheumatica ? ?PRECAUTIONS: None ? ?SUBJECTIVE: Patient reports she is doing well and exercises are going well. The hardest one is the sit to stand exercises. ? ?PAIN:  ?Are you having pain? No ?NPRS scale: 0/10 ?Pain location: Shoulder ?Pain orientation: Bilateral ?PAIN TYPE: Chronic ?Pain description: Intermittent, aching ?Aggravating factors: Reaching overhead or behind  back, lifting ?Relieving factors: Rest, medication/injections ? ? ?OBJECTIVE:  ?PATIENT SURVEYS:  ?FOTO 70% functional status (predicted 67% functional status) ?  ?POSTURE: ?Rounded shoulder and forward head posture ?  ?UPPER EXTREMITY AROM: ?  ?AROM Right ?09/05/2021 Left ?09/05/2021  ?Shoulder flexion 130 130  ?Shoulder extension 60 60  ?Shoulder abduction* 150 150  ?Shoulder internal rotation T10 T10  ?Shoulder external rotation T2 T1  ? *compensated scaption at end range abduction ?  ?UPPER EXTREMITY MMT: ?  ?MMT Right ?09/05/2021 Left ?09/05/2021  ?Shoulder flexion 4+ 4+  ?Shoulder extension 4 4  ?Shoulder abduction 4+ 4+  ?Shoulder internal rotation 5 5   ?Shoulder external rotation 4 4  ?Periscapular musculature Grossly 4-  ? ?LOWER  EXTREMITY MMT: ? ?MMT Right ?09/19/2021 Left ?09/19/2021 Rt / Lt ?10/03/2021 Rt / Lt ?10/17/2021  ?Hip  flexion 4 4    ?Hip extension 4- 4-  4 / 4  ?Hip abduction 4- 4-  4 / 4  ?Knee flexion '4 4 5 '$ / 5   ?Knee extension '4 4 5 '$ / 5   ? ? ?TODAY'S TREATMENT:  ?10/17/2021: ?Therapeutic Exercise: ?NuStep L5 x 5 min with UE/LE while taking subjective ?Row with blue 2 x 20 ?Extension with green 2 x 10 ?ER / IR with red 2 x 20 each ?Bridge 2 x 15 ?Sidelying hip abduction 2 x 15 ?Sit to stand holding 10# at chest 2 x 10 ?Deadlift 25# 2 x 10 ?Forward step-up 6" box x 15 each ?Lateral step-up 6" box x 15 each ? ? ?10/03/2021: ?Therapeutic Exercise: ?NuStep L5 x 5 min with UE/LE while taking subjective ?SLR 2 x 10 with 2# each ?Sidelying hip abduction 2 x 10 with 2# each ?LAQ 3# 2 x 15 each ?Seated hamstring curl with green 2 x 15 each ?Seated double ER and scap retraction with red 2 x 15 ?Sit to stand holding 10# at chest 2 x 10 ?Forward step-up 6" box x 15 each ?Lateral step-up 6" box x 15 each ?Row with green 2 x 20 ?Deadlift 25# 2 x 10 ? ?09/26/2021: ?Therapeutic Exercise: ?NuStep L5 x 5 min with UE/LE while taking subjective ?SLR 2 x 15 each ?Bridge 2 x 10 with 3 sec hold ?Sidelying hip abduction 2 x 15 each ?LAQ 3# 2 x 15 each ?Sit to stand holding 10# at chest 2 x 10 ?Supine horizontal abduction with red 2 x 15 ?Seated double ER and scap retraction with red 2 x 15 ?Standing scaption with 1# 2 x 10 ?Row with green 2 x 20 ?Wall walk with physioball shoulder flexion 10 x 5 sec  ?  ?PATIENT EDUCATION: ?Education details: HEP ?Person educated: Patient ?Education method: Explanation, Demonstration, Tactile cues, Verbal cues ?Education comprehension: verbalized understanding, returned demonstration, verbal cues required, tactile cues required, and needs further education ?  ?HOME EXERCISE PROGRAM: ?Access Code: [email or web address] ?  ?  ?ASSESSMENT: ?CLINICAL  IMPRESSION: ?Patient tolerated therapy well with no adverse effects. Therapy continues to focus on progression of BUE and BLE strength. She is progressing well with her exercises and denies any increase in pain with therapy. No changes made to HEP this visit, patient encouraged to increase overall activity level and walking to improve her tolerance. If patient still doing this well next visit will plan for discharge. She would benefit from continued skilled PT to progress her mobility and strength in order to reduce pain and maximize functional ability. ? ?Objective impairments include decreased activity tolerance, decreased ROM, decreased strength, postural  dysfunction, and pain. ?  ?  ?GOALS: ?Goals reviewed with patient? Yes ?  ?SHORT TERM GOALS: ?  ?STG Name Target Date Goal status  ?1 Patient will be I with initial HEP in order to progress with therapy. ?Baseline: provided at eval ?09/26/2021: patient independent with HEP 10/03/2021 ACHIEVED  ?  ?LONG TERM GOALS:  ?  ?LTG Name Target Date Goal status  ?1 Patient will be I with final HEP to maintain progress from PT. ?Baseline: provided at eval 10/31/2021 INITIAL  ?2 Patient will demonstrate improve shoulder elevation >/= 150 deg in order to improve reach into upper cabinet ?Baseline: 130 deg 10/31/2021 INITIAL  ?3 Patient will demonstrate shoulder strength grossly 5/5 and periscapular strength grossly 4/5 MMT to improve lifting ability ?Baseline: patient demonstrates strength deficits 10/31/2021 INITIAL  ?4 Patient will report continued pain relief of bilateral shoulders with no limitation in dressing or bathing tasks ?Baseline: patient with no pain at eval 10/31/2021 INITIAL  ?  Establish more goals for hips/knees as needed      ?  ?  ?PLAN: ?PT FREQUENCY: 1x/week ?  ?PT DURATION: 8 weeks ?  ?PLANNED INTERVENTIONS: Therapeutic exercises, Therapeutic activity, Neuro Muscular re-education, Balance training, Gait training, Patient/Family education, Joint mobilization,  Aquatic Therapy, Dry Needling, Cryotherapy, Moist heat, and Manual therapy ?  ?PLAN FOR NEXT SESSION: Review HEP and progress PRN, manual/stretching for shoulder mobility and overhead reach, rotator cuff and postural strengthe

## 2021-10-15 NOTE — Telephone Encounter (Addendum)
Pt ready for scheduling on or after 10/31/21 ?  ?Out-of-pocket cost due at time of visit: $280 ?  ?Primary: [employer] ?Prolia co-insurance: 20% (approximately $255) ?Admin fee co-insurance: 20% (approximately $25) ?  ?Secondary: n/a ?Prolia co-insurance:  ?Admin fee co-insurance:  ?  ?Deductible: does not apply ?  ?Prior Auth: not required ?PA# ?Valid:  ?  ?** This summary of benefits is an estimation of the patient's out-of-pocket cost. Exact cost may vary based on individual plan coverage.  ?

## 2021-10-17 ENCOUNTER — Other Ambulatory Visit: Payer: Self-pay

## 2021-10-17 ENCOUNTER — Ambulatory Visit: Payer: PPO | Admitting: Physical Therapy

## 2021-10-17 ENCOUNTER — Encounter: Payer: Self-pay | Admitting: Physical Therapy

## 2021-10-17 DIAGNOSIS — M25512 Pain in left shoulder: Secondary | ICD-10-CM | POA: Diagnosis not present

## 2021-10-17 DIAGNOSIS — G8929 Other chronic pain: Secondary | ICD-10-CM

## 2021-10-17 DIAGNOSIS — M25552 Pain in left hip: Secondary | ICD-10-CM

## 2021-10-17 DIAGNOSIS — M25551 Pain in right hip: Secondary | ICD-10-CM

## 2021-10-17 DIAGNOSIS — M6281 Muscle weakness (generalized): Secondary | ICD-10-CM

## 2021-10-21 ENCOUNTER — Other Ambulatory Visit: Payer: Self-pay | Admitting: Internal Medicine

## 2021-10-21 ENCOUNTER — Telehealth: Payer: Self-pay

## 2021-10-21 NOTE — Telephone Encounter (Signed)
Pt requesting a refill for: ?PROLIA 60 MG/ML SOSY injection ? ?Pharmacy: ?[facility] [place] - [place], [place] - [address] ? ?LOV 09/25/21 ?

## 2021-10-21 NOTE — Telephone Encounter (Signed)
E-Prescribing Status: Receipt confirmed by pharmacy (09/27/2020  4:40 PM EST) ?

## 2021-10-29 NOTE — Therapy (Signed)
?OUTPATIENT PHYSICAL THERAPY TREATMENT NOTE ? ?DISCHARGE ? ? ?Patient Name: [name] [name] ?MRN: [id number] ?DOB:10-08-1940, 81 y.o., female ?Today's Date: 10/30/2021 ? ?PCP: Binnie Rail, MD ?REFERRING PROVIDER: Collier Salina, MD ? ? PT End of Session - 10/30/21 1353   ? ? Visit Number 7   ? Number of Visits 8   ? Date for PT Re-Evaluation 10/31/21   ? Authorization Type [employer]   ? Authorization Time Period FOTO by 6th visit   ? PT Start Time 1355   ? PT Stop Time 1435   ? PT Time Calculation (min) 40 min   ? Activity Tolerance Patient tolerated treatment well   ? Behavior During Therapy [facility] for tasks assessed/performed   ? ?  ?  ? ?  ? ? ? ? ? ? ? ?Past Medical History:  ?Diagnosis Date  ? Benign neoplasm of colon   ? Cataract   ? bilateral,had surgery  ? DDD (degenerative disc disease), lumbar   ? MRI w. [place] [facility] 05/2015, s/p ESI x 1 w/ improvement  ? Diaphragmatic hernia without mention of obstruction or gangrene   ? Diverticulosis of colon (without mention of hemorrhage)   ? DJD (degenerative joint disease) of knee   ? L>R, IA cortisone by [facility] [facility] in [place] 03/2015  ? Esophageal reflux   ? Gallstones   ? History of chicken pox   ? Hyperlipidemia   ? Hypertension   ? Irritable bowel syndrome   ? Migraines   ? Obesity, unspecified   ? Osteoporosis, unspecified   ? Tachycardia   ? Unspecified hemorrhoids with other complication   ? ?Past Surgical History:  ?Procedure Laterality Date  ? APPENDECTOMY  1979  ? CATARACT EXTRACTION    ? CHOLECYSTECTOMY  1989  ? COLONOSCOPY    ? POLYPECTOMY    ? VAGINAL HYSTERECTOMY  1996  ? ?Patient Active Problem List  ? Diagnosis Date Noted  ? PMR (polymyalgia rheumatica) (HCC) - Dr Benjamine Mola 03/19/2021  ? Bilateral shoulder pain 03/08/2021  ? Bilateral hip pain 03/08/2021  ? Positive ANA (antinuclear antibody) 03/08/2021  ? Myalgia 01/20/2021  ? Chronic dermatitis 01/17/2021  ? Urinary incontinence 01/17/2021  ? Hypertensive disorder 01/17/2021  ? Aortic atherosclerosis  ([place]) 09/17/2020  ? Lower back pain 11/07/2019  ? Closed compression fracture of lumbar vertebra ([place]), T11, T12, L1 12/30/2017  ? Lung nodules, stable, no f/u needed 12/30/2017  ? Hypothyroidism 11/01/2016  ? Constipation 04/30/2016  ? Prediabetes 11/01/2015  ? Spinal stenosis of lumbar region 10/03/2015  ? DDD (degenerative disc disease), lumbar   ? DJD (degenerative joint disease) of knee   ? Hypertension 11/25/2010  ? History of colon polyps 05/14/2009  ? Arthralgia 02/06/2009  ? G E R D 08/17/2007  ? Hyperlipidemia 08/16/2007  ? HIATAL HERNIA 08/16/2007  ? DIVERTICULAR DISEASE 08/16/2007  ? IBS 08/16/2007  ? Osteoporosis on prolia 08/16/2007  ? ?PCP: Binnie Rail, MD ?  ?REFERRING PROVIDER: Collier Salina, MD ? ?THERAPY DIAG:  ?Chronic left shoulder pain ? ?Chronic right shoulder pain ? ?Muscle weakness (generalized) ? ?Pain in left hip ? ?Pain in right hip ? ?Chronic pain of left knee ? ?Chronic pain of right knee ? ?PERTINENT HISTORY: Polymalgia rheumatica ? ?PRECAUTIONS: None ? ?SUBJECTIVE: Patient reports she is doing well, no new issues. She is independent and consistent with her HEP.  ? ?PAIN:  ?Are you having pain? No ?NPRS scale: 0/10 ?Pain location: Shoulder ?Pain orientation: Bilateral ?PAIN TYPE:  Chronic ?Pain description: Intermittent, aching ?Aggravating factors: Reaching overhead or behind back, lifting ?Relieving factors: Rest, medication/injections ? ? ?OBJECTIVE:  ?PATIENT SURVEYS:  ?FOTO 70% functional status (predicted 67% functional status) ?  ?POSTURE: ?Rounded shoulder and forward head posture ?  ?UPPER EXTREMITY AROM: ?  ?AROM Right ?09/05/2021 Left ?09/05/2021 Rt / Lt  ?Shoulder flexion 130 130 155 / 150  ?Shoulder extension 60 60   ?Shoulder abduction* 150 150   ?Shoulder internal rotation T10 T10   ?Shoulder external rotation T2 T1   ? *compensated scaption at end range abduction ?  ?UPPER EXTREMITY MMT: ?  ?MMT Right ?09/05/2021 Left ?09/05/2021 Rt / Lt ?10/30/2021  ?Shoulder flexion 4+  4+ 5 / 5  ?Shoulder extension 4 4 5  / 5  ?Shoulder abduction 4+ 4+ 5 / 5  ?Shoulder internal rotation 5 5 5  / 5  ?Shoulder external rotation 4 4 5  / 5  ?Periscapular musculature Grossly 4- 4  ? ?LOWER  EXTREMITY MMT: ? ?MMT Right ?09/19/2021 Left ?09/19/2021 Rt / Lt ?10/03/2021 Rt / Lt ?10/17/2021  ?Hip  flexion 4 4    ?Hip extension 4- 4-  4 / 4  ?Hip abduction 4- 4-  4 / 4  ?Knee flexion 4 4 5  / 5   ?Knee extension 4 4 5  / 5   ? ? ?TODAY'S TREATMENT:  ?10/30/2021: ?Therapeutic Exercise: ?UBE L3 x 4 min (2 fwd/bwd) while taking subjective ?Row with blue 2 x 20 ?Extension with green 2 x 20 ?ER / IR with green 2 x 15 each ?Shoulder flexion wall ball walk 10 x 5 sec ?Bridge 2 x 15 ?SLR 2 x 15 each ?Sidelying hip abduction 2 x 15 each ?Sit to stand holding 10# at chest 2 x 10 ?Deadlift 25# 2 x 10 ?Forward step-up 6" box 2 x 10 each ? ? ?10/17/2021: ?Therapeutic Exercise: ?NuStep L5 x 5 min with UE/LE while taking subjective ?Row with blue 2 x 20 ?Extension with green 2 x 10 ?ER / IR with red 2 x 20 each ?Bridge 2 x 15 ?Sidelying hip abduction 2 x 15 ?Sit to stand holding 10# at chest 2 x 10 ?Deadlift 25# 2 x 10 ?Forward step-up 6" box x 15 each ?Lateral step-up 6" box x 15 each ? ?10/03/2021: ?Therapeutic Exercise: ?NuStep L5 x 5 min with UE/LE while taking subjective ?SLR 2 x 10 with 2# each ?Sidelying hip abduction 2 x 10 with 2# each ?LAQ 3# 2 x 15 each ?Seated hamstring curl with green 2 x 15 each ?Seated double ER and scap retraction with red 2 x 15 ?Sit to stand holding 10# at chest 2 x 10 ?Forward step-up 6" box x 15 each ?Lateral step-up 6" box x 15 each ?Row with green 2 x 20 ?Deadlift 25# 2 x 10 ?  ?PATIENT EDUCATION: ?Education details: POC discharge, HEP ?Person educated: Patient ?Education method: Explanation ?Education comprehension: Verbalized understanding ?  ?HOME EXERCISE PROGRAM: ?Access Code: [email or web address] ?  ?  ?ASSESSMENT: ?CLINICAL IMPRESSION: ?Patient tolerated therapy well with no adverse effects. She has  achieved all establish goals and is independent with her exercise program. She will be discharged from formal PT as it is no longer indicated. ? ?Objective impairments include decreased activity tolerance, decreased ROM, decreased strength, postural dysfunction, and pain. ?  ?  ?GOALS: ?Goals reviewed with patient? Yes ?  ?SHORT TERM GOALS: ?  ?STG Name Target Date Goal status  ?1 Patient will be I with initial HEP in order to  progress with therapy. ?Baseline: provided at eval ?09/26/2021: patient independent with HEP 10/03/2021 ACHIEVED  ?  ?LONG TERM GOALS:  ?  ?LTG Name Target Date Goal status  ?1 Patient will be I with final HEP to maintain progress from PT. ?Baseline: provided at eval ?10/30/2021: independent 10/31/2021 MET  ?2 Patient will demonstrate improve shoulder elevation >/= 150 deg in order to improve reach into upper cabinet ?Baseline: 130 deg ?10/30/2021: see above 10/31/2021 MET  ?3 Patient will demonstrate shoulder strength grossly 5/5 and periscapular strength grossly 4/5 MMT to improve lifting ability ?Baseline: patient demonstrates strength deficits ?10/30/2021: see above 10/31/2021 MET  ?4 Patient will report continued pain relief of bilateral shoulders with no limitation in dressing or bathing tasks ?Baseline: patient with no pain at eval ?10/30/2021: patient reports no pain 10/31/2021 MET  ?  ?  ?PLAN: ?PT FREQUENCY: 1x/week ?  ?PT DURATION: 8 weeks ?  ?PLANNED INTERVENTIONS: Therapeutic exercises, Therapeutic activity, Neuro Muscular re-education, Balance training, Gait training, Patient/Family education, Joint mobilization, Aquatic Therapy, Dry Needling, Cryotherapy, Moist heat, and Manual therapy ?  ?PLAN FOR NEXT SESSION: NA - discharge ?  ? ?Hilda Blades, PT, DPT, LAT, ATC ?10/30/21  2:40 PM ?Phone: [phone] ?Fax: [phone] ? ? ? ?PHYSICAL THERAPY DISCHARGE SUMMARY ? ?Visits from Start of Care: 7 ? ?Current functional level related to goals / functional outcomes: ?See above ?  ?Remaining  deficits: ?See above ?  ?Education / Equipment: ?HEP  ? ?Patient agrees to discharge. Patient goals were met. Patient is being discharged due to meeting the stated rehab goals. ? ?

## 2021-10-29 NOTE — Telephone Encounter (Signed)
Pt has questions regarding the prolia injection and  requesting a cb  ?

## 2021-10-29 NOTE — Telephone Encounter (Addendum)
Spoke with Elisa in the pharmacy today and questions answered so they could process medication to ship out. ? ?Patient notified. ?

## 2021-10-30 ENCOUNTER — Other Ambulatory Visit: Payer: Self-pay

## 2021-10-30 ENCOUNTER — Ambulatory Visit: Payer: PPO | Attending: Internal Medicine | Admitting: Physical Therapy

## 2021-10-30 ENCOUNTER — Encounter: Payer: Self-pay | Admitting: Physical Therapy

## 2021-10-30 DIAGNOSIS — M6281 Muscle weakness (generalized): Secondary | ICD-10-CM | POA: Diagnosis not present

## 2021-10-30 DIAGNOSIS — M25551 Pain in right hip: Secondary | ICD-10-CM | POA: Diagnosis not present

## 2021-10-30 DIAGNOSIS — M25561 Pain in right knee: Secondary | ICD-10-CM | POA: Diagnosis not present

## 2021-10-30 DIAGNOSIS — M25511 Pain in right shoulder: Secondary | ICD-10-CM | POA: Diagnosis not present

## 2021-10-30 DIAGNOSIS — M25562 Pain in left knee: Secondary | ICD-10-CM | POA: Diagnosis not present

## 2021-10-30 DIAGNOSIS — M25552 Pain in left hip: Secondary | ICD-10-CM

## 2021-10-30 DIAGNOSIS — M25512 Pain in left shoulder: Secondary | ICD-10-CM | POA: Diagnosis not present

## 2021-10-30 DIAGNOSIS — G8929 Other chronic pain: Secondary | ICD-10-CM

## 2021-10-30 NOTE — Patient Instructions (Signed)
Access Code: [email or web address] ?URL: [email or web address] ?Date: 10/30/2021 ?Prepared by: Hilda Blades ? ?Exercises ?- Standing shoulder flexion wall slides  - 1 x daily - 2 sets - 10 reps ?- Shoulder External Rotation with Anchored Resistance  - 1 x daily - 2 sets - 15 reps ?- Standing Row with Anchored Resistance  - 1 x daily - 2 sets - 20 reps ?- Active Straight Leg Raise with Quad Set  - 1 x daily - 2 sets - 15 reps ?- Bridge  - 1 x daily - 2 sets - 15 reps ?- Sidelying Hip Abduction  - 1 x daily - 2 sets - 15 reps ?- Sit to Stand  - 1 x daily - 2 sets - 10 reps ?

## 2021-10-31 ENCOUNTER — Ambulatory Visit (INDEPENDENT_AMBULATORY_CARE_PROVIDER_SITE_OTHER): Payer: PPO

## 2021-10-31 DIAGNOSIS — M810 Age-related osteoporosis without current pathological fracture: Secondary | ICD-10-CM

## 2021-10-31 MED ORDER — DENOSUMAB 60 MG/ML ~~LOC~~ SOSY
60.0000 mg | PREFILLED_SYRINGE | Freq: Once | SUBCUTANEOUS | Status: AC
  Administered 2021-10-31: 60 mg via SUBCUTANEOUS

## 2021-10-31 NOTE — Progress Notes (Signed)
Patient seen in office today and Prolia administered in left arm. Patient tolerated injection well. ?

## 2021-11-04 ENCOUNTER — Other Ambulatory Visit (HOSPITAL_COMMUNITY): Payer: Self-pay

## 2021-11-07 ENCOUNTER — Other Ambulatory Visit: Payer: Self-pay | Admitting: Internal Medicine

## 2021-11-12 NOTE — Telephone Encounter (Signed)
Last Prolia inj 10/31/21 ?Next Prolia inj due 05/03/22 ?

## 2021-11-17 NOTE — Progress Notes (Signed)
Office Visit Note  Patient: [name] [name]             Date of Birth: [birth year]           MRN: [id number]             PCP: Binnie Rail, MD Referring: Binnie Rail, MD Visit Date: 11/18/2021   Subjective:  Follow-up (Doing good)   History of Present Illness: [name] [name] is a 81 y.o. female here for follow up bilateral shoulder pain concern for possible PMR vs calcific tendonitis referred to PT and had bilateral subacromial bursa steroid injections. She is doing much better now from before. She still has some right shoulder pain occasionally with certain movement or positions. She completed PT treatment plan and was discharged, trying with pretty good success to keep doing these at home. Still has knee and hip pains worst with climbing stairs. She does not notice any significant swelling.  Previous HPI 08/19/21 [name] [name] is a 81 y.o. female here for follow up for bilateral shoulder pain possible PMR with prednisone taper.  She noticed when taking prednisone both at 10 mg down to 5 mg dose there was a very small benefit to pain in her shoulders and hips.  She is now off the medication and back to having more trouble with this.  The shoulder pain is most severe when trying to reach overhead or behind her back such as when getting dressed and putting on a sweater.  Hip pain is most noticeable when rising from a prolonged sitting position.  She is interested in trial of repeat shoulder joint injections because she had a very good benefit for at least the initial 1 month with trying this last year.   Previous HPI 03/08/21 [name] is a 81 y.o. female here for positive ANA and body aches in multiple sites. Symptoms developed following some URI type symptoms that resolved quickly but with ongoing joint and muscles aches including bilateral shoulders. She experienced symptoms improvement with oral prednisone quickly within about 2 days of starting that medication.  She denies ever having  similar symptoms in the past.  She has not noticed any particular swelling redness or warmth in the involved areas.  There is very prolonged morning stiffness sometimes lasting throughout the day.  She was previously concerned with muscle aches and weakness could represent statin induced myopathy so stopped taking the statin medication and did not notice any difference in symptoms. She denies any new rashes, oral ulcers, persistent lymphadenopathy, weight loss, Raynaud's symptoms, or history of blood clots. She reports thinks there was some type of imaging of her shoulders obtained in Dr. Eilleen Kempf office that was not available for review.   Review of Systems  Constitutional:  Positive for fatigue.  HENT:  Negative for mouth dryness.   Eyes:  Negative for dryness.  Respiratory:  Negative for shortness of breath.   Cardiovascular:  Negative for swelling in legs/feet.  Gastrointestinal:  Negative for constipation.  Endocrine: Negative for excessive thirst.  Genitourinary:  Negative for difficulty urinating.  Musculoskeletal:  Positive for joint pain, gait problem, joint pain, muscle weakness and morning stiffness.  Skin:  Negative for rash.  Allergic/Immunologic: Negative for susceptible to infections.  Neurological:  Negative for weakness.  Hematological:  Negative for bruising/bleeding tendency.  Psychiatric/Behavioral:  Negative for sleep disturbance.    PMFS History:  Patient Active Problem List   Diagnosis Date Noted   PMR (polymyalgia rheumatica) (HCC) -  Dr Benjamine Mola 03/19/2021   Bilateral shoulder pain 03/08/2021   Bilateral hip pain 03/08/2021   Myalgia 01/20/2021   Chronic dermatitis 01/17/2021   Urinary incontinence 01/17/2021   Hypertensive disorder 01/17/2021   Aortic atherosclerosis ([place]) 09/17/2020   Lower back pain 11/07/2019   Closed compression fracture of lumbar vertebra ([place]), T11, T12, L1 12/30/2017   Lung nodules, stable, no f/u needed 12/30/2017   Hypothyroidism  11/01/2016   Constipation 04/30/2016   Prediabetes 11/01/2015   Spinal stenosis of lumbar region 10/03/2015   DDD (degenerative disc disease), lumbar    DJD (degenerative joint disease) of knee    Hypertension 11/25/2010   History of colon polyps 05/14/2009   Arthralgia 02/06/2009   G E R D 08/17/2007   Hyperlipidemia 08/16/2007   HIATAL HERNIA 08/16/2007   DIVERTICULAR DISEASE 08/16/2007   IBS 08/16/2007   Osteoporosis on prolia 08/16/2007    Past Medical History:  Diagnosis Date   Benign neoplasm of colon    Cataract    bilateral,had surgery   DDD (degenerative disc disease), lumbar    MRI w. [facility] 05/2015, s/p ESI x 1 w/ improvement   Diaphragmatic hernia without mention of obstruction or gangrene    Diverticulosis of colon (without mention of hemorrhage)    DJD (degenerative joint disease) of knee    L>R, IA cortisone by [facility] in L 03/2015   Esophageal reflux    Gallstones    History of chicken pox    Hyperlipidemia    Hypertension    Irritable bowel syndrome    Migraines    Obesity, unspecified    Osteoporosis, unspecified    Tachycardia    Unspecified hemorrhoids with other complication     Family History  Problem Relation Age of Onset   Stroke Mother    Dementia Mother    Stroke Father    Heart disease Sister        x 2   Colon cancer Sister    Stroke Sister 27   Heart disease Brother    Past Surgical History:  Procedure Laterality Date   APPENDECTOMY  1979   CATARACT EXTRACTION     CHOLECYSTECTOMY  1989   COLONOSCOPY     POLYPECTOMY     VAGINAL HYSTERECTOMY  1996   Social History   Social History Narrative   Exercise: no regular exercise   Immunization History  Administered Date(s) Administered   Fluad Quad(high Dose 65+) 05/12/2019, 06/11/2020, 07/03/2021   Influenza Split 05/28/2011, 06/23/2012   Influenza Whole 05/25/2007, 05/31/2010   Influenza, High Dose Seasonal PF 06/27/2015, 04/30/2016, 05/01/2017, 05/04/2018    Influenza,inj,Quad PF,6+ Mos 04/20/2013, 06/13/2014   Moderna SARS-COV2 Booster Vaccination 07/05/2020, 12/12/2020   Moderna Sars-Covid-2 Vaccination 08/08/2019, 09/05/2019   Pneumococcal Conjugate-13 10/31/2014   Pneumococcal Polysaccharide-23 10/07/2013   Td 10/07/2013   Zoster Recombinat (Shingrix) 09/03/2021, 11/12/2021   Zoster, Live 10/31/2014     Objective: Vital Signs: BP 131/70 (BP Location: Left Arm, Patient Position: Sitting, Cuff Size: Normal)   Pulse 68   Resp 16   Ht 5' (1.524 m)   Wt 136 lb (61.7 kg)   BMI 26.56 kg/m    Physical Exam Cardiovascular:     Rate and Rhythm: Normal rate and regular rhythm.  Pulmonary:     Effort: Pulmonary effort is normal.     Breath sounds: Normal breath sounds.  Musculoskeletal:     Right lower leg: No edema.     Left lower leg: No edema.  Skin:    General: Skin is warm and dry.  Neurological:     Mental Status: She is alert.  Psychiatric:        Mood and Affect: Mood normal.     Musculoskeletal Exam:  Shoulders full ROM no tenderness or swelling Elbows full ROM no tenderness or swelling Wrists full ROM no tenderness or swelling Fingers chronic heberdon's nodes on fingers bilaterally Hip normal internal and external rotation without pain, no tenderness to lateral hip palpation Knees full ROM no tenderness or swelling, crepitus present   Investigation: No additional findings.  Imaging: No results found.  Recent Labs: Lab Results  Component Value Date   WBC 8.6 09/25/2021   HGB 13.6 09/25/2021   PLT 262.0 09/25/2021   NA 140 09/25/2021   K 3.8 09/25/2021   CL 101 09/25/2021   CO2 33 (H) 09/25/2021   GLUCOSE 87 09/25/2021   BUN 20 09/25/2021   CREATININE 0.70 09/25/2021   BILITOT 1.3 (H) 09/25/2021   ALKPHOS 36 (L) 09/25/2021   AST 17 09/25/2021   ALT 14 09/25/2021   PROT 7.1 09/25/2021   ALBUMIN 4.1 09/25/2021   CALCIUM 9.9 09/25/2021   GFRAA 87 03/12/2020    Speciality Comments: No specialty  comments available.  Procedures:  No procedures performed Allergies: Patient has no known allergies.   Assessment / Plan:     Visit Diagnoses: Chronic pain of both shoulders PMR (polymyalgia rheumatica) (HCC)   So far having a pretty durable benefit after subacromial bursa steroid injections.  These were the worst affected sites of PMR involvement but is also having improvement with lower extremity symptoms.  I think this is lower risk than maintaining on longer-term oral steroid medication.  Recommend she can follow-up as needed could repeat treatment would not recommend additional steroid injections sooner than 3 months at the earliest.  Orders: No orders of the defined types were placed in this encounter.  No orders of the defined types were placed in this encounter.    Follow-Up Instructions: Return if symptoms worsen or fail to improve.   Collier Salina, MD  Note - This record has been created using [employer].  Chart creation errors have been sought, but may not always  have been located. Such creation errors do not reflect on  the standard of medical care.

## 2021-11-18 ENCOUNTER — Ambulatory Visit: Payer: PPO | Admitting: Internal Medicine

## 2021-11-18 ENCOUNTER — Encounter: Payer: Self-pay | Admitting: Internal Medicine

## 2021-11-18 VITALS — BP 131/70 | HR 68 | Resp 16 | Ht 60.0 in | Wt 136.0 lb

## 2021-11-18 DIAGNOSIS — M25512 Pain in left shoulder: Secondary | ICD-10-CM | POA: Diagnosis not present

## 2021-11-18 DIAGNOSIS — G8929 Other chronic pain: Secondary | ICD-10-CM | POA: Diagnosis not present

## 2021-11-18 DIAGNOSIS — M25511 Pain in right shoulder: Secondary | ICD-10-CM | POA: Diagnosis not present

## 2021-11-18 DIAGNOSIS — M353 Polymyalgia rheumatica: Secondary | ICD-10-CM | POA: Diagnosis not present

## 2021-12-11 DIAGNOSIS — H52203 Unspecified astigmatism, bilateral: Secondary | ICD-10-CM | POA: Diagnosis not present

## 2021-12-11 DIAGNOSIS — Z961 Presence of intraocular lens: Secondary | ICD-10-CM | POA: Diagnosis not present

## 2021-12-27 ENCOUNTER — Telehealth: Payer: PPO

## 2021-12-28 ENCOUNTER — Other Ambulatory Visit: Payer: Self-pay | Admitting: Internal Medicine

## 2021-12-29 ENCOUNTER — Other Ambulatory Visit: Payer: Self-pay | Admitting: Internal Medicine

## 2021-12-29 DIAGNOSIS — E7849 Other hyperlipidemia: Secondary | ICD-10-CM

## 2021-12-29 DIAGNOSIS — I700 Atherosclerosis of aorta: Secondary | ICD-10-CM

## 2022-01-01 ENCOUNTER — Telehealth: Payer: PPO

## 2022-01-17 ENCOUNTER — Other Ambulatory Visit: Payer: Self-pay | Admitting: Internal Medicine

## 2022-01-17 NOTE — Telephone Encounter (Signed)
Ok to PCP please 

## 2022-02-25 ENCOUNTER — Telehealth: Payer: PPO

## 2022-03-19 ENCOUNTER — Other Ambulatory Visit (HOSPITAL_BASED_OUTPATIENT_CLINIC_OR_DEPARTMENT_OTHER): Payer: Self-pay

## 2022-03-31 ENCOUNTER — Other Ambulatory Visit: Payer: Self-pay | Admitting: Internal Medicine

## 2022-03-31 DIAGNOSIS — E7849 Other hyperlipidemia: Secondary | ICD-10-CM

## 2022-03-31 DIAGNOSIS — I700 Atherosclerosis of aorta: Secondary | ICD-10-CM

## 2022-04-01 NOTE — Progress Notes (Unsigned)
Subjective:    Patient ID: [name] [name], female    DOB: 11-17-1940, 81 y.o.   MRN: [id number]      HPI [name] [name] is here for a Physical exam.    Last prolia 10/31/21, DEXA due  Saw  Dr Benjamine Mola for PMR - better - has some pain - ? Related to arthritis   Medications and allergies reviewed with patient and updated if appropriate.  Current Outpatient Medications on File Prior to Visit  Medication Sig Dispense Refill   acetaminophen (TYLENOL) 650 MG CR tablet Take 650 mg by mouth every 8 (eight) hours as needed for pain.     Biotin 5 MG CAPS Taking daily  0   bisoprolol-hydrochlorothiazide (ZIAC) 5-6.25 MG tablet TAKE 1 TABLET BY MOUTH EVERY DAY 90 tablet 1   CALCIUM CITRATE-VITAMIN D PO Take 600 mg by mouth in the morning and at bedtime.     denosumab (PROLIA) 60 MG/ML SOLN injection Inject 60 mg into the skin every 6 (six) months. Administer in upper arm, thigh, or abdomen     famotidine (PEPCID) 20 MG tablet Take 20 mg by mouth daily.     levothyroxine (SYNTHROID) 50 MCG tablet TAKE 1 TABLET BY MOUTH EVERY DAY 90 tablet 3   Multiple Vitamins-Minerals (CENTRUM SILVER PO) Take by mouth daily.     omeprazole (PRILOSEC) 20 MG capsule TAKE 1 CAPSULE BY MOUTH EVERY DAY 90 capsule 3   polyethylene glycol (MIRALAX / GLYCOLAX) packet Take 17 g by mouth daily. 2 capfuls daily     pravastatin (PRAVACHOL) 40 MG tablet TAKE 1 TABLET BY MOUTH EVERY DAY 90 tablet 0   PROLIA 60 MG/ML SOSY injection INJECT 60 MG INTO THE SKIN ONCE FOR 1 DOSE 60 mL 1   Wheat Dextrin (BENEFIBER) POWD Twice daily  0   triamcinolone cream (KENALOG) 0.1 % APPLY A THIN LAYER TO THE AFFECTED AREA(S) BY TOPICAL ROUTE 2 TIMES PER DAY     No current facility-administered medications on file prior to visit.    Review of Systems  Constitutional:  Negative for fever.  Eyes:  Negative for visual disturbance.  Respiratory:  Negative for cough, shortness of breath and wheezing.   Cardiovascular:  Negative for chest pain,  palpitations and leg swelling.  Gastrointestinal:  Negative for abdominal pain, blood in stool, constipation, diarrhea and nausea.       GERD controlled  Genitourinary:  Negative for dysuria.  Musculoskeletal:  Negative for arthralgias and back pain.       When standing pain from posterior hip to knee on left when getting up.  Arms ache  Skin:  Negative for rash.  Neurological:  Negative for light-headedness and headaches.       Poor balance  Psychiatric/Behavioral:  Negative for dysphoric mood. The patient is not nervous/anxious.        Objective:   Vitals:   04/02/22 0905  BP: 128/78  Pulse: 61  Temp: 97.9 F (36.6 C)  SpO2: 98%   Filed Weights   04/02/22 0905  Weight: 137 lb 3.2 oz (62.2 kg)   Body mass index is 26.8 kg/m.  BP Readings from Last 3 Encounters:  04/02/22 128/78  11/18/21 131/70  09/25/21 130/70    Wt Readings from Last 3 Encounters:  04/02/22 137 lb 3.2 oz (62.2 kg)  11/18/21 136 lb (61.7 kg)  09/25/21 136 lb 3.2 oz (61.8 kg)       Physical Exam Constitutional: She appears well-developed and well-nourished. No distress.  HENT:  Head: Normocephalic and atraumatic.  Right Ear: External ear normal. Normal ear canal and TM Left Ear: External ear normal.  Normal ear canal and TM Mouth/Throat: Oropharynx is clear and moist.  Eyes: Conjunctivae normal.  Neck: Neck supple. No tracheal deviation present. No thyromegaly present.  No carotid bruit  Cardiovascular: Normal rate, regular rhythm and normal heart sounds.   No murmur heard.  No edema. Pulmonary/Chest: Effort normal and breath sounds normal. No respiratory distress. She has no wheezes. She has no rales.  Breast: deferred   Abdominal: Soft. She exhibits no distension. There is no tenderness.  Lymphadenopathy: She has no cervical adenopathy.  Skin: Skin is warm and dry. She is not diaphoretic.  Psychiatric: She has a normal mood and affect. Her behavior is normal.     Lab Results   Component Value Date   WBC 8.6 09/25/2021   HGB 13.6 09/25/2021   HCT 41.6 09/25/2021   PLT 262.0 09/25/2021   GLUCOSE 87 09/25/2021   CHOL 213 (H) 09/25/2021   TRIG 148.0 09/25/2021   HDL 59.80 09/25/2021   LDLDIRECT 158.0 06/27/2015   LDLCALC 124 (H) 09/25/2021   ALT 14 09/25/2021   AST 17 09/25/2021   NA 140 09/25/2021   K 3.8 09/25/2021   CL 101 09/25/2021   CREATININE 0.70 09/25/2021   BUN 20 09/25/2021   CO2 33 (H) 09/25/2021   TSH 1.15 09/25/2021   HGBA1C 5.9 09/25/2021         Assessment & Plan:   Physical exam: Screening blood work  ordered Exercise  very active, doing some balance exercises Weight  normal  Substance abuse  none   Reviewed recommended immunizations.   Health Maintenance  Topic Date Due   COVID-19 Vaccine (3 - Moderna series) 02/06/2021   INFLUENZA VACCINE  02/25/2022   DEXA SCAN  03/22/2022   TETANUS/TDAP  10/08/2023   Pneumonia Vaccine 52+ Years old  Completed   Zoster Vaccines- Shingrix  Completed   HPV VACCINES  Aged Out   COLONOSCOPY (Pts 45-106yr Insurance coverage will need to be confirmed)  Discontinued          See Problem List for Assessment and Plan of chronic medical problems.

## 2022-04-01 NOTE — Patient Instructions (Signed)
Blood work was ordered.     Medications changes include :   none   Your bone density was ordered.    Return in about 6 months (around 10/01/2022) for follow up.   Health Maintenance, Female Adopting a healthy lifestyle and getting preventive care are important in promoting health and wellness. Ask your health care provider about: The right schedule for you to have regular tests and exams. Things you can do on your own to prevent diseases and keep yourself healthy. What should I know about diet, weight, and exercise? Eat a healthy diet  Eat a diet that includes plenty of vegetables, fruits, low-fat dairy products, and lean protein. Do not eat a lot of foods that are high in solid fats, added sugars, or sodium. Maintain a healthy weight Body mass index (BMI) is used to identify weight problems. It estimates body fat based on height and weight. Your health care provider can help determine your BMI and help you achieve or maintain a healthy weight. Get regular exercise Get regular exercise. This is one of the most important things you can do for your health. Most adults should: Exercise for at least 150 minutes each week. The exercise should increase your heart rate and make you sweat (moderate-intensity exercise). Do strengthening exercises at least twice a week. This is in addition to the moderate-intensity exercise. Spend less time sitting. Even light physical activity can be beneficial. Watch cholesterol and blood lipids Have your blood tested for lipids and cholesterol at 83 years of age, then have this test every 5 years. Have your cholesterol levels checked more often if: Your lipid or cholesterol levels are high. You are older than 83 years of age. You are at high risk for heart disease. What should I know about cancer screening? Depending on your health history and family history, you may need to have cancer screening at various ages. This may include screening  for: Breast cancer. Cervical cancer. Colorectal cancer. Skin cancer. Lung cancer. What should I know about heart disease, diabetes, and high blood pressure? Blood pressure and heart disease High blood pressure causes heart disease and increases the risk of stroke. This is more likely to develop in people who have high blood pressure readings or are overweight. Have your blood pressure checked: Every 3-5 years if you are 74-38 years of age. Every year if you are 60 years old or older. Diabetes Have regular diabetes screenings. This checks your fasting blood sugar level. Have the screening done: Once every three years after age 19 if you are at a normal weight and have a low risk for diabetes. More often and at a younger age if you are overweight or have a high risk for diabetes. What should I know about preventing infection? Hepatitis B If you have a higher risk for hepatitis B, you should be screened for this virus. Talk with your health care provider to find out if you are at risk for hepatitis B infection. Hepatitis C Testing is recommended for: Everyone born from [birth year] through 1967. Anyone with known risk factors for hepatitis C. Sexually transmitted infections (STIs) Get screened for STIs, including gonorrhea and chlamydia, if: You are sexually active and are younger than 83 years of age. You are older than 83 years of age and your health care provider tells you that you are at risk for this type of infection. Your sexual activity has changed since you were last screened, and you are at increased risk for  chlamydia or gonorrhea. Ask your health care provider if you are at risk. Ask your health care provider about whether you are at high risk for HIV. Your health care provider may recommend a prescription medicine to help prevent HIV infection. If you choose to take medicine to prevent HIV, you should first get tested for HIV. You should then be tested every 3 months for as long as you  are taking the medicine. Pregnancy If you are about to stop having your period (premenopausal) and you may become pregnant, seek counseling before you get pregnant. Take 400 to 800 micrograms (mcg) of folic acid every day if you become pregnant. Ask for birth control (contraception) if you want to prevent pregnancy. Osteoporosis and menopause Osteoporosis is a disease in which the bones lose minerals and strength with aging. This can result in bone fractures. If you are 68 years old or older, or if you are at risk for osteoporosis and fractures, ask your health care provider if you should: Be screened for bone loss. Take a calcium or vitamin D supplement to lower your risk of fractures. Be given hormone replacement therapy (HRT) to treat symptoms of menopause. Follow these instructions at home: Alcohol use Do not drink alcohol if: Your health care provider tells you not to drink. You are pregnant, may be pregnant, or are planning to become pregnant. If you drink alcohol: Limit how much you have to: 0-1 drink a day. Know how much alcohol is in your drink. In the [place] one drink equals one 12 oz bottle of beer (355 mL), one 5 oz glass of wine (148 mL), or one 1 oz glass of hard liquor (44 mL). Lifestyle Do not use any products that contain nicotine or tobacco. These products include cigarettes, chewing tobacco, and vaping devices, such as e-cigarettes. If you need help quitting, ask your health care provider. Do not use street drugs. Do not share needles. Ask your health care provider for help if you need support or information about quitting drugs. General instructions Schedule regular health, dental, and eye exams. Stay current with your vaccines. Tell your health care provider if: You often feel depressed. You have ever been abused or do not feel safe at home. Summary Adopting a healthy lifestyle and getting preventive care are important in promoting health and wellness. Follow your  health care provider's instructions about healthy diet, exercising, and getting tested or screened for diseases. Follow your health care provider's instructions on monitoring your cholesterol and blood pressure. This information is not intended to replace advice given to you by your health care provider. Make sure you discuss any questions you have with your health care provider. Document Revised: 12/03/2020 Document Reviewed: 12/03/2020 Elsevier Patient Education  [place].

## 2022-04-02 ENCOUNTER — Ambulatory Visit (INDEPENDENT_AMBULATORY_CARE_PROVIDER_SITE_OTHER): Payer: PPO | Admitting: Internal Medicine

## 2022-04-02 ENCOUNTER — Encounter: Payer: Self-pay | Admitting: Internal Medicine

## 2022-04-02 VITALS — BP 128/78 | HR 61 | Temp 97.9°F | Ht 60.0 in | Wt 137.2 lb

## 2022-04-02 DIAGNOSIS — I10 Essential (primary) hypertension: Secondary | ICD-10-CM | POA: Diagnosis not present

## 2022-04-02 DIAGNOSIS — E038 Other specified hypothyroidism: Secondary | ICD-10-CM | POA: Diagnosis not present

## 2022-04-02 DIAGNOSIS — R7303 Prediabetes: Secondary | ICD-10-CM | POA: Diagnosis not present

## 2022-04-02 DIAGNOSIS — E063 Autoimmune thyroiditis: Secondary | ICD-10-CM | POA: Diagnosis not present

## 2022-04-02 DIAGNOSIS — K219 Gastro-esophageal reflux disease without esophagitis: Secondary | ICD-10-CM

## 2022-04-02 DIAGNOSIS — Z0000 Encounter for general adult medical examination without abnormal findings: Secondary | ICD-10-CM

## 2022-04-02 DIAGNOSIS — E7849 Other hyperlipidemia: Secondary | ICD-10-CM | POA: Diagnosis not present

## 2022-04-02 DIAGNOSIS — M810 Age-related osteoporosis without current pathological fracture: Secondary | ICD-10-CM | POA: Diagnosis not present

## 2022-04-02 LAB — CBC WITH DIFFERENTIAL/PLATELET
Basophils Absolute: 0 10*3/uL (ref 0.0–0.1)
Basophils Relative: 0.5 % (ref 0.0–3.0)
Eosinophils Absolute: 0.1 10*3/uL (ref 0.0–0.7)
Eosinophils Relative: 1.9 % (ref 0.0–5.0)
HCT: 40.1 % (ref 36.0–46.0)
Hemoglobin: 13.4 g/dL (ref 12.0–15.0)
Lymphocytes Relative: 30.1 % (ref 12.0–46.0)
Lymphs Abs: 2.4 10*3/uL (ref 0.7–4.0)
MCHC: 33.4 g/dL (ref 30.0–36.0)
MCV: 88.5 fl (ref 78.0–100.0)
Monocytes Absolute: 0.7 10*3/uL (ref 0.1–1.0)
Monocytes Relative: 8.2 % (ref 3.0–12.0)
Neutro Abs: 4.7 10*3/uL (ref 1.4–7.7)
Neutrophils Relative %: 59.3 % (ref 43.0–77.0)
Platelets: 212 10*3/uL (ref 150.0–400.0)
RBC: 4.53 Mil/uL (ref 3.87–5.11)
RDW: 13.4 % (ref 11.5–15.5)
WBC: 7.9 10*3/uL (ref 4.0–10.5)

## 2022-04-02 LAB — COMPREHENSIVE METABOLIC PANEL
ALT: 15 U/L (ref 0–35)
AST: 19 U/L (ref 0–37)
Albumin: 4.1 g/dL (ref 3.5–5.2)
Alkaline Phosphatase: 39 U/L (ref 39–117)
BUN: 16 mg/dL (ref 6–23)
CO2: 33 mEq/L — ABNORMAL HIGH (ref 19–32)
Calcium: 10.7 mg/dL — ABNORMAL HIGH (ref 8.4–10.5)
Chloride: 101 mEq/L (ref 96–112)
Creatinine, Ser: 0.7 mg/dL (ref 0.40–1.20)
GFR: 80.21 mL/min (ref >60.00)
Glucose, Bld: 93 mg/dL (ref 70–99)
Potassium: 4.2 mEq/L (ref 3.5–5.1)
Sodium: 140 mEq/L (ref 135–145)
Total Bilirubin: 1 mg/dL (ref 0.2–1.2)
Total Protein: 7.5 g/dL (ref 6.0–8.3)

## 2022-04-02 LAB — LIPID PANEL
Cholesterol: 217 mg/dL — ABNORMAL HIGH (ref 0–200)
HDL: 56.5 mg/dL (ref >39.00)
LDL Cholesterol: 130 mg/dL — ABNORMAL HIGH (ref 0–99)
NonHDL: 160.77
Total CHOL/HDL Ratio: 4
Triglycerides: 153 mg/dL — ABNORMAL HIGH (ref 0.0–149.0)
VLDL: 30.6 mg/dL (ref 0.0–40.0)

## 2022-04-02 LAB — VITAMIN D 25 HYDROXY (VIT D DEFICIENCY, FRACTURES): VITD: 52.22 ng/mL (ref 30.00–100.00)

## 2022-04-02 LAB — TSH: TSH: 3.21 u[IU]/mL (ref 0.35–5.50)

## 2022-04-02 LAB — HEMOGLOBIN A1C: Hgb A1c MFr Bld: 5.9 % (ref 4.6–6.5)

## 2022-04-02 NOTE — Assessment & Plan Note (Signed)
Chronic Check a1c Low sugar / carb diet Stressed regular exercise  

## 2022-04-02 NOTE — Assessment & Plan Note (Signed)
Chronic Blood pressure well controlled CMP Continue bisoprolol-HCTZ 5-6.25 mg daily 

## 2022-04-02 NOTE — Assessment & Plan Note (Signed)
Chronic  Clinically euthyroid Check tsh and will titrate med dose if needed Currently taking levothyroxine 50 mcg daily 

## 2022-04-02 NOTE — Assessment & Plan Note (Signed)
Chronic GERD controlled Continue omeprazole 20 mg QOD alternating with Pepcid 20 mg

## 2022-04-02 NOTE — Assessment & Plan Note (Addendum)
Chronic Continue Prolia every 6 months for osteoporosis and high risk of fracture Last Prolia injection 10/31/21-due in 1 month Stressed the importance of regular exercise Continue calcium and vitamin d level Check vitamin d level

## 2022-04-02 NOTE — Assessment & Plan Note (Signed)
Chronic Regular exercise and healthy diet encouraged Check lipid panel  Continue pravastatin 40 mg daily 

## 2022-04-03 ENCOUNTER — Ambulatory Visit (INDEPENDENT_AMBULATORY_CARE_PROVIDER_SITE_OTHER)
Admission: RE | Admit: 2022-04-03 | Discharge: 2022-04-03 | Disposition: A | Discharge status: Home / Self Care | Payer: PPO | Source: Ambulatory Visit | Attending: Internal Medicine | Admitting: Internal Medicine

## 2022-04-03 DIAGNOSIS — M810 Age-related osteoporosis without current pathological fracture: Secondary | ICD-10-CM | POA: Diagnosis not present

## 2022-04-04 ENCOUNTER — Other Ambulatory Visit: Payer: Self-pay | Admitting: Internal Medicine

## 2022-04-05 NOTE — Telephone Encounter (Signed)
Prolia VOB initiated via [email or web address] 

## 2022-04-14 ENCOUNTER — Other Ambulatory Visit: Payer: Self-pay | Admitting: Internal Medicine

## 2022-04-14 DIAGNOSIS — Z1231 Encounter for screening mammogram for malignant neoplasm of breast: Secondary | ICD-10-CM

## 2022-04-15 NOTE — Telephone Encounter (Signed)
Pt ready for scheduling on or after 05/03/22  Out-of-pocket cost due at time of visit: $301  Primary: [employer] Prolia co-insurance: 20% (approximately $276) Admin fee co-insurance: 20% (approximately $25)  Secondary: n/a Prolia co-insurance:  Admin fee co-insurance:   Deductible: does not apply  Prior Auth: NOT required PA# Valid:   ** This summary of benefits is an estimation of the patient's out-of-pocket cost. Exact cost may vary based on individual plan coverage.

## 2022-04-15 NOTE — Telephone Encounter (Signed)
NUR visit 05/07/22

## 2022-05-01 ENCOUNTER — Ambulatory Visit (INDEPENDENT_AMBULATORY_CARE_PROVIDER_SITE_OTHER): Payer: PPO

## 2022-05-01 DIAGNOSIS — Z23 Encounter for immunization: Secondary | ICD-10-CM | POA: Diagnosis not present

## 2022-05-01 NOTE — Progress Notes (Signed)
Pt received in the RD.

## 2022-05-06 ENCOUNTER — Telehealth: Payer: Self-pay

## 2022-05-06 ENCOUNTER — Ambulatory Visit: Payer: PPO

## 2022-05-06 NOTE — Telephone Encounter (Signed)
Attempted to reach patient today but not able to reach her.  If she calls back please let her know that we do have Prolia in stock.

## 2022-05-06 NOTE — Telephone Encounter (Signed)
Patient is scheduled for Prolia injection tomorrow, wants to know if we have it in stock or if she needs to order it and push the appointment out.

## 2022-05-07 ENCOUNTER — Ambulatory Visit (INDEPENDENT_AMBULATORY_CARE_PROVIDER_SITE_OTHER): Payer: PPO | Admitting: *Deleted

## 2022-05-07 DIAGNOSIS — M810 Age-related osteoporosis without current pathological fracture: Secondary | ICD-10-CM

## 2022-05-07 MED ORDER — DENOSUMAB 60 MG/ML ~~LOC~~ SOSY
60.0000 mg | PREFILLED_SYRINGE | Freq: Once | SUBCUTANEOUS | Status: AC
  Administered 2022-05-07: 60 mg via SUBCUTANEOUS

## 2022-05-07 NOTE — Progress Notes (Addendum)
Administered prolia 60 mg/ml right arm. Pt tolerated well.

## 2022-05-10 ENCOUNTER — Encounter (HOSPITAL_COMMUNITY): Payer: Self-pay | Admitting: Emergency Medicine

## 2022-05-10 ENCOUNTER — Ambulatory Visit (HOSPITAL_COMMUNITY)
Admission: EM | Admit: 2022-05-10 | Discharge: 2022-05-10 | Disposition: A | Discharge status: Home / Self Care | Payer: PPO | Attending: Physician Assistant | Admitting: Physician Assistant

## 2022-05-10 DIAGNOSIS — K5900 Constipation, unspecified: Secondary | ICD-10-CM | POA: Diagnosis not present

## 2022-05-10 MED ORDER — POLYETHYLENE GLYCOL 3350 17 G PO PACK: 17.0000 g | PACK | Freq: Every day | ORAL | 0 refills | Status: AC

## 2022-05-10 NOTE — ED Provider Notes (Signed)
[place]    CSN: [id number] Arrival date & time: 05/10/22  1603      History   Chief Complaint Chief Complaint  Patient presents with   Abdominal Pain   Constipation    HPI [name] [name] is a 81 y.o. female.   81 year old female presents with constipation.  Patient indicates for the past 3 days she has had constipation and not been able to have a bowel movement.  She relates that she is having some abdominal pain without nausea or vomiting.  She indicates she is not having fever or chills.  Patient relates that she also has polymyalgia rheumatica which was diagnosed recently, and she also has pulled her back and is having lower back pain which is of a chronic nature occurring at the same time as the constipation.  She indicates that she has taken a combination of some fiber additives and several doses of ClearLax which is similar to MiraLAX.  She indicates she has not had any results from those although it is only been 24 to 36 hours since she is taking the doses of those particular medications.  She indicates the last dose of ClearLax was this morning.  She has been advised to ensure that she is drinking liquids, warm fluids which may help a BM to occur.   Abdominal Pain Associated symptoms: constipation   Constipation Associated symptoms: abdominal pain     Past Medical History:  Diagnosis Date   Benign neoplasm of colon    Cataract    bilateral,had surgery   DDD (degenerative disc disease), lumbar    MRI w. [facility] 05/2015, s/p ESI x 1 w/ improvement   Diaphragmatic hernia without mention of obstruction or gangrene    Diverticulosis of colon (without mention of hemorrhage)    DJD (degenerative joint disease) of knee    L>R, IA cortisone by [facility] in L 03/2015   Esophageal reflux    Gallstones    History of chicken pox    Hyperlipidemia    Hypertension    Irritable bowel syndrome    Migraines    Obesity, unspecified    Osteoporosis, unspecified     Tachycardia    Unspecified hemorrhoids with other complication     Patient Active Problem List   Diagnosis Date Noted   PMR (polymyalgia rheumatica) ([place]) - Dr Benjamine Mola 03/19/2021   Bilateral shoulder pain 03/08/2021   Bilateral hip pain 03/08/2021   Myalgia 01/20/2021   Chronic dermatitis 01/17/2021   Urinary incontinence 01/17/2021   Aortic atherosclerosis ([place]) 09/17/2020   Lower back pain 11/07/2019   Closed compression fracture of lumbar vertebra ([place]), T11, T12, L1 12/30/2017   Lung nodules, stable, no f/u needed 12/30/2017   Hypothyroidism 11/01/2016   Constipation 04/30/2016   Prediabetes 11/01/2015   Spinal stenosis of lumbar region 10/03/2015   DDD (degenerative disc disease), lumbar    DJD (degenerative joint disease) of knee    Hypertension 11/25/2010   History of colon polyps 05/14/2009   Arthralgia 02/06/2009   G E R D 08/17/2007   Hyperlipidemia 08/16/2007   HIATAL HERNIA 08/16/2007   DIVERTICULAR DISEASE 08/16/2007   IBS 08/16/2007   Osteoporosis on prolia 08/16/2007    Past Surgical History:  Procedure Laterality Date   APPENDECTOMY  1979   CATARACT EXTRACTION     CHOLECYSTECTOMY  1989   COLONOSCOPY     POLYPECTOMY     VAGINAL HYSTERECTOMY  1996    OB History   No  obstetric history on file.      Home Medications    Prior to Admission medications   Medication Sig Start Date End Date Taking? Authorizing Provider  acetaminophen (TYLENOL) 650 MG CR tablet Take 650 mg by mouth every 8 (eight) hours as needed for pain.    [provider]  Biotin 5 MG CAPS Taking daily 11/01/15   Binnie Rail, MD  bisoprolol-hydrochlorothiazide [facility]) 5-6.25 MG tablet TAKE 1 TABLET BY MOUTH EVERY DAY 04/04/22   Binnie Rail, MD  CALCIUM CITRATE-VITAMIN D PO Take 600 mg by mouth in the morning and at bedtime.    [provider]  denosumab (PROLIA) 60 MG/ML SOLN injection Inject 60 mg into the skin every 6 (six) months. Administer in upper arm, thigh, or  abdomen    [provider]  famotidine (PEPCID) 20 MG tablet Take 20 mg by mouth daily.    [provider]  levothyroxine (SYNTHROID) 50 MCG tablet TAKE 1 TABLET BY MOUTH EVERY DAY 01/17/22   Binnie Rail, MD  Multiple Vitamins-Minerals (CENTRUM SILVER PO) Take by mouth daily.    [provider]  omeprazole (PRILOSEC) 20 MG capsule TAKE 1 CAPSULE BY MOUTH EVERY DAY 12/30/21   Burns, Claudina Lick, MD  polyethylene glycol (MIRALAX / GLYCOLAX) 17 g packet Take 17 g by mouth daily. 2 capfuls daily 05/10/22   Nyoka Lint, PA-C  pravastatin (PRAVACHOL) 40 MG tablet TAKE 1 TABLET BY MOUTH EVERY DAY 04/01/22   Burns, Claudina Lick, MD  PROLIA 60 MG/ML SOSY injection INJECT 60 MG INTO THE SKIN ONCE FOR 1 DOSE 10/22/21   Binnie Rail, MD  triamcinolone cream (KENALOG) 0.1 % APPLY A THIN LAYER TO THE AFFECTED AREA(S) BY TOPICAL ROUTE 2 TIMES PER DAY    [provider]  Wheat Dextrin (BENEFIBER) POWD Twice daily 05/01/17   Binnie Rail, MD    Family History Family History  Problem Relation Age of Onset   Stroke Mother    Dementia Mother    Stroke Father    Heart disease Sister        x 2   Colon cancer Sister    Stroke Sister 38   Heart disease Brother     Social History Social History   Tobacco Use   Smoking status: Never   Smokeless tobacco: Never  Vaping Use   Vaping Use: Never used  Substance Use Topics   Alcohol use: No    Alcohol/week: 0.0 standard drinks of alcohol   Drug use: No     Allergies   Patient has no known allergies.   Review of Systems Review of Systems  Gastrointestinal:  Positive for abdominal pain and constipation.     Physical Exam Triage Vital Signs ED Triage Vitals  Enc Vitals Group     BP 05/10/22 1648 (!) 171/82     Pulse Rate 05/10/22 1648 67     Resp 05/10/22 1648 18     Temp 05/10/22 1648 98.3 F (36.8 C)     Temp Source 05/10/22 1648 Oral     SpO2 05/10/22 1648 95 %     Weight --      Height --      Head  Circumference --      Peak Flow --      Pain Score 05/10/22 1646 10     Pain Loc --      Pain Edu? --      Excl. in [place]? --  No data found.  Updated Vital Signs BP (!) 171/82 (BP Location: Left Arm)   Pulse 67   Temp 98.3 F (36.8 C) (Oral)   Resp 18   SpO2 95%   Visual Acuity Right Eye Distance:   Left Eye Distance:   Bilateral Distance:    Right Eye Near:   Left Eye Near:    Bilateral Near:     Physical Exam Constitutional:      Appearance: She is well-developed.  Cardiovascular:     Rate and Rhythm: Normal rate and regular rhythm.     Heart sounds: Normal heart sounds.  Pulmonary:     Effort: Pulmonary effort is normal.     Breath sounds: Normal breath sounds and air entry. No wheezing, rhonchi or rales.  Abdominal:     General: Abdomen is flat. Bowel sounds are increased.     Palpations: Abdomen is soft.     Tenderness: There is no abdominal tenderness. There is no guarding or rebound.  Neurological:     Mental Status: She is alert.      UC Treatments / Results  Labs (all labs ordered are listed, but only abnormal results are displayed) Labs Reviewed - No data to display  EKG   Radiology No results found.  Procedures Procedures (including critical care time)  Medications Ordered in UC Medications - No data to display  Initial Impression / Assessment and Plan / UC Course  I have reviewed the triage vital signs and the nursing notes.  Pertinent labs & imaging results that were available during my care of the patient were reviewed by me and considered in my medical decision making (see chart for details).    Plan: 1.  The constipation will be treated with the following: A.  MiraLAX to be taken this evening, 1 dose to enhance the occurrence of a bowel movement. B.  Patient advised to increase fluid intake, to include warm liquids. 2.  Patient has been advised that if she does not have a bowel movement within the next 48 hours or if she starts  having increasing abdominal pain, nausea, vomiting with fever she is to report to the emergency room. Final Clinical Impressions(s) / UC Diagnoses   Final diagnoses:  Constipation, unspecified constipation type     Discharge Instructions      Advised to use 1 dose of MiraLAX this evening to help promote bowel movement.  Make sure to drink fluids after using the MiraLAX. If symptoms fail to improve over the next 48 hours and there is no bowel movement or abdominal cramping gets worse with nausea, fever or vomiting then report to the emergency room for evaluation.     ED Prescriptions     Medication Sig Dispense Auth. Provider   polyethylene glycol (MIRALAX / GLYCOLAX) 17 g packet Take 17 g by mouth daily. 2 capfuls daily 14 each Nyoka Lint, PA-C      PDMP not reviewed this encounter.   Nyoka Lint, PA-C 05/10/22 1717

## 2022-05-10 NOTE — ED Triage Notes (Signed)
Pt reports generalized abdominal pain and constipation x 3 days. States last normal BM was Thursday morning. Reports taking a suppository and clearlax lastnight and tried taking clearlax and fiber this morning with no relief. Denies n/v.

## 2022-05-10 NOTE — Discharge Instructions (Signed)
Advised to use 1 dose of MiraLAX this evening to help promote bowel movement.  Make sure to drink fluids after using the MiraLAX. If symptoms fail to improve over the next 48 hours and there is no bowel movement or abdominal cramping gets worse with nausea, fever or vomiting then report to the emergency room for evaluation.

## 2022-05-12 ENCOUNTER — Emergency Department (HOSPITAL_COMMUNITY): Payer: PPO

## 2022-05-12 ENCOUNTER — Other Ambulatory Visit: Payer: Self-pay

## 2022-05-12 ENCOUNTER — Telehealth: Payer: Self-pay

## 2022-05-12 ENCOUNTER — Emergency Department (HOSPITAL_COMMUNITY)
Admission: EM | Admit: 2022-05-12 | Discharge: 2022-05-12 | Disposition: A | Discharge status: Home / Self Care | Payer: PPO | Attending: Emergency Medicine | Admitting: Emergency Medicine

## 2022-05-12 ENCOUNTER — Encounter (HOSPITAL_COMMUNITY): Payer: Self-pay | Admitting: Emergency Medicine

## 2022-05-12 DIAGNOSIS — I10 Essential (primary) hypertension: Secondary | ICD-10-CM | POA: Diagnosis not present

## 2022-05-12 DIAGNOSIS — Z79899 Other long term (current) drug therapy: Secondary | ICD-10-CM | POA: Insufficient documentation

## 2022-05-12 DIAGNOSIS — E039 Hypothyroidism, unspecified: Secondary | ICD-10-CM | POA: Diagnosis not present

## 2022-05-12 DIAGNOSIS — M545 Low back pain, unspecified: Secondary | ICD-10-CM | POA: Diagnosis not present

## 2022-05-12 DIAGNOSIS — K5900 Constipation, unspecified: Secondary | ICD-10-CM | POA: Diagnosis not present

## 2022-05-12 DIAGNOSIS — M5459 Other low back pain: Secondary | ICD-10-CM | POA: Diagnosis not present

## 2022-05-12 DIAGNOSIS — R109 Unspecified abdominal pain: Secondary | ICD-10-CM | POA: Diagnosis not present

## 2022-05-12 LAB — CBC
HCT: 44 % (ref 36.0–46.0)
Hemoglobin: 14.7 g/dL (ref 12.0–15.0)
MCH: 29.8 pg (ref 26.0–34.0)
MCHC: 33.4 g/dL (ref 30.0–36.0)
MCV: 89.1 fL (ref 80.0–100.0)
Platelets: 264 10*3/uL (ref 150–400)
RBC: 4.94 MIL/uL (ref 3.87–5.11)
RDW: 12.8 % (ref 11.5–15.5)
WBC: 8.6 10*3/uL (ref 4.0–10.5)
nRBC: 0 % (ref 0.0–0.2)

## 2022-05-12 LAB — COMPREHENSIVE METABOLIC PANEL
ALT: 15 U/L (ref 0–44)
AST: 20 U/L (ref 15–41)
Albumin: 4.1 g/dL (ref 3.5–5.0)
Alkaline Phosphatase: 50 U/L (ref 38–126)
Anion gap: 8 (ref 5–15)
BUN: 10 mg/dL (ref 8–23)
CO2: 28 mmol/L (ref 22–32)
Calcium: 9.4 mg/dL (ref 8.9–10.3)
Chloride: 102 mmol/L (ref 98–111)
Creatinine, Ser: 0.72 mg/dL (ref 0.44–1.00)
GFR, Estimated: >60 mL/min (ref >60)
Glucose, Bld: 111 mg/dL — ABNORMAL HIGH (ref 70–99)
Potassium: 3.8 mmol/L (ref 3.5–5.1)
Sodium: 138 mmol/L (ref 135–145)
Total Bilirubin: 1.3 mg/dL — ABNORMAL HIGH (ref 0.3–1.2)
Total Protein: 7.7 g/dL (ref 6.5–8.1)

## 2022-05-12 LAB — URINALYSIS, MICROSCOPIC (REFLEX)

## 2022-05-12 LAB — URINALYSIS, ROUTINE W REFLEX MICROSCOPIC
Bilirubin Urine: NEGATIVE
Glucose, UA: NEGATIVE mg/dL
Ketones, ur: NEGATIVE mg/dL
Leukocytes,Ua: NEGATIVE
Nitrite: NEGATIVE
Protein, ur: NEGATIVE mg/dL
Specific Gravity, Urine: 1.015 (ref 1.005–1.030)
pH: 7 (ref 5.0–8.0)

## 2022-05-12 LAB — LIPASE, BLOOD: Lipase: 38 U/L (ref 11–51)

## 2022-05-12 MED ORDER — LIDOCAINE 5 % EX PTCH
1.0000 | MEDICATED_PATCH | CUTANEOUS | Status: DC
  Administered 2022-05-12: 1 via TRANSDERMAL
  Filled 2022-05-12: qty 1

## 2022-05-12 MED ORDER — LIDOCAINE 4 % EX PTCH: 1.0000 | MEDICATED_PATCH | CUTANEOUS | 0 refills | Status: AC

## 2022-05-12 MED ORDER — METHOCARBAMOL 500 MG PO TABS
750.0000 mg | ORAL_TABLET | Freq: Once | ORAL | Status: AC
  Administered 2022-05-12: 750 mg via ORAL
  Filled 2022-05-12: qty 2

## 2022-05-12 MED ORDER — KETOROLAC TROMETHAMINE 60 MG/2ML IM SOLN
30.0000 mg | Freq: Once | INTRAMUSCULAR | Status: AC
  Administered 2022-05-12: 30 mg via INTRAMUSCULAR
  Filled 2022-05-12: qty 2

## 2022-05-12 MED ORDER — PREDNISONE 10 MG (21) PO TBPK: ORAL_TABLET | Freq: Every day | ORAL | 0 refills | Status: DC

## 2022-05-12 MED ORDER — MAGNESIUM CITRATE PO SOLN: 1.0000 | Freq: Once | ORAL | 0 refills | Status: AC

## 2022-05-12 MED ORDER — METHOCARBAMOL 500 MG PO TABS: 500.0000 mg | ORAL_TABLET | Freq: Three times a day (TID) | ORAL | 0 refills | Status: DC | PRN

## 2022-05-12 MED ORDER — IOHEXOL 350 MG/ML SOLN
100.0000 mL | Freq: Once | INTRAVENOUS | Status: AC | PRN
  Administered 2022-05-12: 75 mL via INTRAVENOUS

## 2022-05-12 MED ORDER — PREDNISONE 20 MG PO TABS
40.0000 mg | ORAL_TABLET | Freq: Once | ORAL | Status: AC
  Administered 2022-05-12: 40 mg via ORAL
  Filled 2022-05-12: qty 2

## 2022-05-12 NOTE — ED Provider Notes (Signed)
[facility] EMERGENCY DEPARTMENT Provider Note   CSN: [id number] Arrival date & time: 05/12/22  0913     History {Add pertinent medical, surgical, social history, OB history to HPI:1} Chief Complaint  Patient presents with   Back Pain    [name] [name] is a 81 y.o. female.   Back Pain Patient presents for low back pain.  Medical history includes HLD, GERD, diverticulosis, IBS, HTN, osteoporosis, prediabetes, hypothyroidism, chronic compression fractures of lower thoracic and upper lumbar spine, polymyalgia rheumatica.  4 days ago, she stood up from a chair.  At that time, she experienced lower back pain.  She did not feel any cracks or pops.  Her pain has been persistent since that time.  She has been wearing a back brace that she had from before.  She has been treating her pain with arthritis strength Tylenol.  She has not had a bowel movement since onset of her low back pain.  She has been taking MiraLAX at home.  She states that she takes 1-2 doses per day.  She continues to not have a bowel movement.  She denies any difficulty urinating.  She has not had any lower extremity numbness or weakness.  She has continued to ambulate but does have worsened low back pain with movements.  She states that she has had similar episodes of low back pain in the past.  Previously, symptoms were improved with a steroid injection.  She was seen at urgent care 2 days ago.  She was advised to continue MiraLAX and to come to the ED if she still does not have a bowel movement by today.     Home Medications Prior to Admission medications   Medication Sig Start Date End Date Taking? Authorizing Provider  acetaminophen (TYLENOL) 650 MG CR tablet Take 650 mg by mouth every 8 (eight) hours as needed for pain.    [provider]  Biotin 5 MG CAPS Taking daily 11/01/15   Binnie Rail, MD  bisoprolol-hydrochlorothiazide [facility]) 5-6.25 MG tablet TAKE 1 TABLET BY MOUTH EVERY DAY 04/04/22    Binnie Rail, MD  CALCIUM CITRATE-VITAMIN D PO Take 600 mg by mouth in the morning and at bedtime.    [provider]  denosumab (PROLIA) 60 MG/ML SOLN injection Inject 60 mg into the skin every 6 (six) months. Administer in upper arm, thigh, or abdomen    [provider]  famotidine (PEPCID) 20 MG tablet Take 20 mg by mouth daily.    [provider]  levothyroxine (SYNTHROID) 50 MCG tablet TAKE 1 TABLET BY MOUTH EVERY DAY 01/17/22   Binnie Rail, MD  Multiple Vitamins-Minerals (CENTRUM SILVER PO) Take by mouth daily.    [provider]  omeprazole (PRILOSEC) 20 MG capsule TAKE 1 CAPSULE BY MOUTH EVERY DAY 12/30/21   Burns, Claudina Lick, MD  polyethylene glycol (MIRALAX / GLYCOLAX) 17 g packet Take 17 g by mouth daily. 2 capfuls daily 05/10/22   Nyoka Lint, PA-C  pravastatin (PRAVACHOL) 40 MG tablet TAKE 1 TABLET BY MOUTH EVERY DAY 04/01/22   Burns, Claudina Lick, MD  PROLIA 60 MG/ML SOSY injection INJECT 60 MG INTO THE SKIN ONCE FOR 1 DOSE 10/22/21   Binnie Rail, MD  triamcinolone cream (KENALOG) 0.1 % APPLY A THIN LAYER TO THE AFFECTED AREA(S) BY TOPICAL ROUTE 2 TIMES PER DAY    [provider]  Wheat Dextrin (BENEFIBER) POWD Twice daily 05/01/17   Binnie Rail, MD  Allergies    Patient has no known allergies.    Review of Systems   Review of Systems  Gastrointestinal:  Positive for constipation.  Musculoskeletal:  Positive for back pain.  All other systems reviewed and are negative.   Physical Exam Updated Vital Signs BP 133/86 (BP Location: Right Arm)   Pulse 83   Temp 98.7 F (37.1 C) (Oral)   Resp 18   SpO2 97%  Physical Exam Vitals and nursing note reviewed.  Constitutional:      General: She is not in acute distress.    Appearance: Normal appearance. She is well-developed and normal weight. She is not ill-appearing, toxic-appearing or diaphoretic.  HENT:     Head: Normocephalic and atraumatic.     Right Ear: External ear normal.      Left Ear: External ear normal.     Nose: Nose normal.     Mouth/Throat:     Mouth: Mucous membranes are moist.     Pharynx: Oropharynx is clear.  Eyes:     Extraocular Movements: Extraocular movements intact.     Conjunctiva/sclera: Conjunctivae normal.  Cardiovascular:     Rate and Rhythm: Normal rate and regular rhythm.     Heart sounds: No murmur heard. Pulmonary:     Effort: Pulmonary effort is normal. No respiratory distress.     Breath sounds: Normal breath sounds. No wheezing or rales.  Chest:     Chest wall: No tenderness.  Abdominal:     General: There is no distension.     Palpations: Abdomen is soft.     Tenderness: There is no abdominal tenderness.  Musculoskeletal:        General: Tenderness (Mild, T12 area) present. No swelling or deformity.     Cervical back: Normal range of motion and neck supple.     Right lower leg: No edema.     Left lower leg: No edema.  Skin:    General: Skin is warm and dry.     Capillary Refill: Capillary refill takes less than 2 seconds.     Coloration: Skin is not jaundiced or pale.  Neurological:     General: No focal deficit present.     Mental Status: She is alert and oriented to person, place, and time.     Cranial Nerves: No cranial nerve deficit.     Sensory: No sensory deficit.     Motor: No weakness.     Coordination: Coordination normal.  Psychiatric:        Mood and Affect: Mood normal.        Behavior: Behavior normal.        Thought Content: Thought content normal.        Judgment: Judgment normal.     ED Results / Procedures / Treatments   Labs (all labs ordered are listed, but only abnormal results are displayed) Labs Reviewed  COMPREHENSIVE METABOLIC PANEL - Abnormal; Notable for the following components:      Result Value   Glucose, Bld 111 (*)    Total Bilirubin 1.3 (*)    All other components within normal limits  URINALYSIS, ROUTINE W REFLEX MICROSCOPIC - Abnormal; Notable for the following  components:   Hgb urine dipstick MODERATE (*)    All other components within normal limits  URINALYSIS, MICROSCOPIC (REFLEX) - Abnormal; Notable for the following components:   Bacteria, UA RARE (*)    Non Squamous Epithelial PRESENT (*)    All other components within normal limits  LIPASE, BLOOD  CBC    EKG None  Radiology CT ABDOMEN PELVIS W CONTRAST  Result Date: 05/12/2022 CLINICAL DATA:  Abdominal pain, clinical suspicion for bowel obstruction EXAM: CT ABDOMEN AND PELVIS WITH CONTRAST TECHNIQUE: Multidetector CT imaging of the abdomen and pelvis was performed using the standard protocol following bolus administration of intravenous contrast. RADIATION DOSE REDUCTION: This exam was performed according to the departmental dose-optimization program which includes automated exposure control, adjustment of the mA and/or kV according to patient size and/or use of iterative reconstruction technique. CONTRAST:  77m OMNIPAQUE IOHEXOL 350 MG/ML SOLN COMPARISON:  12/24/2017 FINDINGS: Lower chest: In image 27 of series 5, there is 7 mm nodule in the left lower lobe. In image 35, there is a 3 mm nodule in left lower lobe. These nodules appear stable. There is a subtle increase in interstitial markings in the periphery of both lower lung fields, more so on the left side suggesting scarring. There is mild ectasia of bronchi in the left lower lobe. Possible small coronary artery calcifications are seen. Hepatobiliary: There is fatty infiltration in liver. There is no dilation of bile ducts. Surgical clips are seen in gallbladder fossa. Pancreas: No focal abnormalities are seen. Spleen: Unremarkable. Adrenals/Urinary Tract: Adrenals are not enlarged. There is no hydronephrosis. There are no renal or ureteral stones. There are few tiny low-density foci in renal cortex on both sides each measuring less than 3 mm in size, possibly cysts. Urinary bladder is not distended. Stomach/Bowel: Stomach is not distended.  There is fluid in the lumen of small bowel loops with no significant dilation. Appendix is not seen. There is no pericecal inflammation. Few diverticula are seen in colon. There is no significant wall thickening in colon. There is no pericolic stranding. Vascular/Lymphatic: Scattered arterial calcifications are seen. No significant lymphadenopathy is seen. Reproductive: Uterus is not seen.  There are no adnexal masses. Other: There is no ascites or pneumoperitoneum. Small umbilical hernia containing fat is seen. Musculoskeletal: Compression fractures are seen in the bodies of T10, T11, T12 and L1 vertebrae. Fractures of T10 and T12 were not evident in the previous examination. There is no demonstrable break in the cortical margins. Alignment of posterior margins of vertebral bodies has not changed significantly. IMPRESSION: There is no evidence of intestinal obstruction of or pneumoperitoneum. There is no hydronephrosis. There is fluid in the lumen of small bowel loops with no significant dilation. This may be a normal variation or suggest nonspecific enteritis. Fatty liver. Possible small coronary artery calcifications. Possible tiny bilateral renal cysts. Few diverticula in colon without signs of focal diverticulitis. Old compression fractures are seen in the bodies of T11 and L1 vertebrae. New compressions are noted in the bodies of T10 and T12 vertebrae. Age of the new fractures is not clear. Please correlate with physical examination findings. There are small nodular densities in left lower lobe with no significant change since 12/24/2017 suggesting possible granulomas. Scarring is seen in the lower lung fields, more so on the left side. Other findings as described in the body of the report. Electronically Signed   By: PElmer PickerM.D.   On: 05/12/2022 13:03    Procedures Procedures  {Document cardiac monitor, telemetry assessment procedure when appropriate:1}  Medications Ordered in  ED Medications  iohexol (OMNIPAQUE) 350 MG/ML injection 100 mL (75 mLs Intravenous Contrast Given 05/12/22 1241)    ED Course/ Medical Decision Making/ A&P  Medical Decision Making Amount and/or Complexity of Data Reviewed Labs: ordered.   This patient presents to the ED for concern of back pain and constipation, this involves an extensive number of treatment options, and is a complaint that carries with it a high risk of complications and morbidity.  The differential diagnosis includes ***   Co morbidities that complicate the patient evaluation  ***   Additional history obtained:  Additional history obtained from *** External records from outside source obtained and reviewed including ***   Lab Tests:  I Ordered, and personally interpreted labs.  The pertinent results include:  ***   Imaging Studies ordered:  I ordered imaging studies including ***  I independently visualized and interpreted imaging which showed *** I agree with the radiologist interpretation   Cardiac Monitoring: / EKG:  The patient was maintained on a cardiac monitor.  I personally viewed and interpreted the cardiac monitored which showed an underlying rhythm of: ***   Consultations Obtained:  I requested consultation with the ***,  and discussed lab and imaging findings as well as pertinent plan - they recommend: ***   Problem List / ED Course / Critical interventions / Medication management  Patient presents for back pain and constipation.  Onset of back pain was 4 days ago.  She has not had a bowel movement since that time.  She was seen in urgent care 2 days ago and advised to continue MiraLAX at home.  She was also advised to come to the ED if she continues to not have a bowel movement.  Prior to being bedded in the ED, diagnostic work-up was initiated.  Lab work was unremarkable.  CT scan of abdomen pelvis showed likely nonspecific enteritis, old compression  fractures of T11 and L1, indeterminate age fractures of T10 and T12.  On exam, patient is resting in supine position.  She has a back brace on which she states that she has been wearing since this current low back pain flare.  She states that she has had similar flares in the past that have been improved with a steroid injection.  She has been taking Tylenol arthritis at home.  Patient does have some mild midline tenderness in the area of T12.  She has no lower extremity weakness or areas of diminished sensation. I do not suspect cauda equina.  Multimodal pain control was given in the ED.  Narcotic medications were withheld given her current constipation.  On reassessment,***. I ordered medication including ***  for ***  Reevaluation of the patient after these medicines showed that the patient {resolved/improved/worsened:23923::"improved"} I have reviewed the patients home medicines and have made adjustments as needed   Social Determinants of Health:  ***   Test / Admission - Considered:  ***   {Document critical care time when appropriate:1} {Document review of labs and clinical decision tools ie heart score, Chads2Vasc2 etc:1}  {Document your independent review of radiology images, and any outside records:1} {Document your discussion with family members, caretakers, and with consultants:1} {Document social determinants of health affecting pt's care:1} {Document your decision making why or why not admission, treatments were needed:1} Final Clinical Impression(s) / ED Diagnoses Final diagnoses:  None    Rx / DC Orders ED Discharge Orders     None

## 2022-05-12 NOTE — Discharge Instructions (Addendum)
For your back pain: -Take ibuprofen and Tylenol -Prescriptions were sent to your pharmacy for a muscle relaxer called Robaxin and lidocaine patches.  Avoid having more than 1 lidocaine patch on your skin at any given time.  Take muscle relaxer only as needed. -A prescription for a steroid taper was sent to your pharmacy.  Take this as prescribed.  For your constipation: -Mix 6 doses of MiraLAX in 1 L of water or juice. When you have resumed having bowel movements, take 1 dose daily and discontinue if you have continued loose stools. -A prescription was sent to your pharmacy for magnesium citrate.  This will also help with constipation. -If you continue to have constipation despite these therapies, you will likely need suppository and/or enema.  You should get seen again if oral medications do not relieve your constipation.  Return to the emergency department, as needed, for any persistent or worsening symptoms.

## 2022-05-12 NOTE — ED Triage Notes (Signed)
Patient here with complaint back pain that started when she stood from a chair on Thursday, reports similar episodes in the past and states she has been told "she has issues with two discs somewhere" in her spine. Patient also reports constipation since onset of back pain. Patient is alert, oriented, and in no apparent distress.

## 2022-05-12 NOTE — Telephone Encounter (Signed)
Patient is currently at [facility]  Nurse Assessment Nurse: Clovis Riley, RN, Georgina Peer Date/Time Eilene Ghazi Time): 05/10/2022 3:08:55 PM Confirm and document reason for call. If symptomatic, describe symptoms. ---Caller states she is constipated, last BM was wednesday morning but it was small. States she has severe abdominal pain, and back pain. she has taken miralax and fiber. States the pain is in her upper abdomen and back pain is in the lower back. Does the patient have any new or worsening symptoms? ---Yes Will a triage be completed? ---Yes Related visit to physician within the last 2 weeks? ---No Does the PT have any chronic conditions? (i.e. diabetes, asthma, this includes High risk factors for pregnancy, etc.) ---Yes List chronic conditions. ---cholesterol, HTN, Afib Is this a behavioral health or substance abuse call? ---No Guidelines Guideline Title Affirmed Question Affirmed Notes Nurse Date/Time Eilene Ghazi Time) Abdominal Pain - Upper [1] Pain lasts > 10 minutes   [2] age > 25 Clovis Riley, RNGeorgina Peer 05/10/2022 3:12:23 PM Disp. Time Eilene Ghazi Time) Disposition Final User 05/10/2022 3:07:48 PM Send to Urgent Michel Bickers 05/10/2022 3:14:43 PM Go to ED Now Yes Clovis Riley, RN, Georgina Peer Final Disposition 05/10/2022 3:14:43 PM Go to ED Now Yes Clovis Riley, RN, Leilani Merl Disagree/Comply Comply Caller Understands Yes PreDisposition Did not know what to do Care Advice Given Per Guideline GO TO ED NOW: * You need to be seen in the Emergency Department. * Go to the ED at ___________ [place] now. Drive carefully. NOTE TO TRIAGER - DRIVING: * Another adult should drive. CARE ADVICE given per Abdominal Pain - Upper (Adult) guideline. CALL EMS 911 IF: * Confusion occurs * Passes out or becomes too weak to stand * Severe difficulty breathing occurs. Referrals GO TO FACILITY UNDECIDED

## 2022-05-12 NOTE — ED Provider Triage Note (Signed)
[facility]  [name] [name] , a 81 y.o. female  was evaluated in triage.  Pt complains of constipation, low back pain.  Patient reports last bowel movement was 4 days ago.  The patient was seen 2 days ago at urgent care and prescribed MiraLAX for constipation.  Patient was complaining of back pain which she states started before her constipation.  Patient has history of low back pain, currently wearing back brace.  The patient denies any recent heavy lifting, trauma to account for this increased back pain.  Patient denies any groin numbness, lower extremity weakness.  Patient states has been attempting to control her constipation at home with MiraLAX as well as increased fiber diet.  Patient reports she was advised to continue taking MiraLAX at urgent care and if her constipation persisted to present to the ED for further management.  The patient reports she has a history of abdominal surgeries to include appendectomy, cholecystectomy, hysterectomy.  Patient reports she still passing flatus however it is "seldom".  Patient denies any fevers, nausea, vomiting, dysuria.  Review of Systems  Positive:  Negative:   Physical Exam  BP (!) 151/74 (BP Location: Right Arm)   Pulse 69   Temp 98.2 F (36.8 C) (Oral)   Resp 18   SpO2 99%  Gen:   Awake, no distress   Resp:  Normal effort  MSK:   Moves extremities without difficulty  Other:    Medical Decision Making  Medically screening exam initiated at 10:51 AM.  Appropriate orders placed.  [name] was informed that the remainder of the evaluation will be completed by another provider, this initial triage assessment does not replace that evaluation, and the importance of remaining in the ED until their evaluation is complete.     Azucena Cecil, PA-C 05/12/22 1052

## 2022-05-12 NOTE — Telephone Encounter (Signed)
Patient went to [facility]gent care on Saturday.

## 2022-05-13 NOTE — Progress Notes (Addendum)
Office Visit Note  Patient: [name] [name]             Date of Birth: [birth year]           MRN: [id number]             PCP: Binnie Rail, MD Referring: Binnie Rail, MD Visit Date: 05/20/2022   Subjective:  Follow-up (Left arm pain. Recent back issues. )   History of Present Illness: [name] [name] is a 81 y.o. female here for follow up bilateral shoulder pain concern for possible PMR vs calcific tendonitis referred to PT and had bilateral subacromial bursa steroid injections. She was experiencing increased pain again in the left shoulder but then more recently started having severe low back pain on 10/12 after standing up from a chair. She did not recall any preceding fall or injury. She was suffering from constipation but was improving on miralax and senna and enema. CT imaging of the abdomen showed stool burden also demonstrated known chronic vertebral fractures in lower thoracic spine, possibly with new lesion since last imaging but not specifically acute. She is wearing a back brace now for support. Pain is worst at night in bed. Methocarbamol prescribed without a large improvement. Steroid taper started since about 5 days ago and shoulder is better but back still in pain. She has follow up scheduled with PCP office in 2 days.  Previous HPI 11/18/2021 [name] [name] is a 81 y.o. female here for follow up bilateral shoulder pain concern for possible PMR vs calcific tendonitis referred to PT and had bilateral subacromial bursa steroid injections. She is doing much better now from before. She still has some right shoulder pain occasionally with certain movement or positions. She completed PT treatment plan and was discharged, trying with pretty good success to keep doing these at home. Still has knee and hip pains worst with climbing stairs. She does not notice any significant swelling.   Previous HPI 08/19/21 [name] [name] is a 81 y.o. female here for follow up for bilateral shoulder pain  possible PMR with prednisone taper.  She noticed when taking prednisone both at 10 mg down to 5 mg dose there was a very small benefit to pain in her shoulders and hips.  She is now off the medication and back to having more trouble with this.  The shoulder pain is most severe when trying to reach overhead or behind her back such as when getting dressed and putting on a sweater.  Hip pain is most noticeable when rising from a prolonged sitting position.  She is interested in trial of repeat shoulder joint injections because she had a very good benefit for at least the initial 1 month with trying this last year.   Previous HPI 03/08/21 [name] [name] is a 81 y.o. female here for positive ANA and body aches in multiple sites. Symptoms developed following some URI type symptoms that resolved quickly but with ongoing joint and muscles aches including bilateral shoulders. She experienced symptoms improvement with oral prednisone quickly within about 2 days of starting that medication.  She denies ever having similar symptoms in the past.  She has not noticed any particular swelling redness or warmth in the involved areas.  There is very prolonged morning stiffness sometimes lasting throughout the day.  She was previously concerned with muscle aches and weakness could represent statin induced myopathy so stopped taking the statin medication and did not notice any difference in symptoms. She  denies any new rashes, oral ulcers, persistent lymphadenopathy, weight loss, Raynaud's symptoms, or history of blood clots. She reports thinks there was some type of imaging of her shoulders obtained in Dr. Eilleen Kempf office that was not available for review.    Review of Systems  Constitutional:  Positive for fatigue.  HENT:  Negative for mouth sores and mouth dryness.   Eyes:  Negative for dryness.  Respiratory:  Negative for shortness of breath.   Cardiovascular:  Negative for chest pain and palpitations.  Gastrointestinal:   Positive for constipation. Negative for blood in stool and diarrhea.  Endocrine: Positive for increased urination.  Genitourinary:  Positive for involuntary urination.  Musculoskeletal:  Positive for joint pain, joint pain, myalgias, muscle weakness, morning stiffness, muscle tenderness and myalgias. Negative for gait problem and joint swelling.  Skin:  Negative for color change, rash, hair loss and sensitivity to sunlight.  Allergic/Immunologic: Negative for susceptible to infections.  Neurological:  Negative for dizziness and headaches.  Hematological:  Negative for swollen glands.  Psychiatric/Behavioral:  Positive for sleep disturbance. Negative for depressed mood. The patient is not nervous/anxious.     PMFS History:  Patient Active Problem List   Diagnosis Date Noted   PMR (polymyalgia rheumatica) ([place]) - Dr Benjamine Mola 03/19/2021   Bilateral shoulder pain 03/08/2021   Bilateral hip pain 03/08/2021   Myalgia 01/20/2021   Chronic dermatitis 01/17/2021   Urinary incontinence 01/17/2021   Aortic atherosclerosis ([place]) 09/17/2020   Lower back pain 11/07/2019   Closed compression fracture of lumbar vertebra ([place]), T11, T12, L1 12/30/2017   Lung nodules, stable, no f/u needed 12/30/2017   Hypothyroidism 11/01/2016   Constipation 04/30/2016   Prediabetes 11/01/2015   Spinal stenosis of lumbar region 10/03/2015   DDD (degenerative disc disease), lumbar    DJD (degenerative joint disease) of knee    Hypertension 11/25/2010   History of colon polyps 05/14/2009   Arthralgia 02/06/2009   G E R D 08/17/2007   Hyperlipidemia 08/16/2007   HIATAL HERNIA 08/16/2007   DIVERTICULAR DISEASE 08/16/2007   IBS 08/16/2007   Osteoporosis on prolia 08/16/2007    Past Medical History:  Diagnosis Date   Benign neoplasm of colon    Cataract    bilateral,had surgery   DDD (degenerative disc disease), lumbar    MRI w. [facility] 05/2015, s/p ESI x 1 w/ improvement   Diaphragmatic hernia without mention  of obstruction or gangrene    Diverticulosis of colon (without mention of hemorrhage)    DJD (degenerative joint disease) of knee    L>R, IA cortisone by [facility] in L 03/2015   Esophageal reflux    Gallstones    History of chicken pox    Hyperlipidemia    Hypertension    Irritable bowel syndrome    Migraines    Obesity, unspecified    Osteoporosis, unspecified    Tachycardia    Unspecified hemorrhoids with other complication     Family History  Problem Relation Age of Onset   Stroke Mother    Dementia Mother    Stroke Father    Heart disease Sister        x 2   Colon cancer Sister    Stroke Sister 81   Heart disease Brother    Past Surgical History:  Procedure Laterality Date   APPENDECTOMY  1979   CATARACT EXTRACTION     CHOLECYSTECTOMY  1989   COLONOSCOPY     POLYPECTOMY     VAGINAL HYSTERECTOMY  1996   Social History   Social History Narrative   Exercise: no regular exercise   Immunization History  Administered Date(s) Administered   Fluad Quad(high Dose 65+) 05/12/2019, 06/11/2020, 07/03/2021, 05/01/2022   Influenza Split 05/28/2011, 06/23/2012   Influenza Whole 05/25/2007, 05/31/2010   Influenza, High Dose Seasonal PF 06/27/2015, 04/30/2016, 05/01/2017, 05/04/2018   Influenza,inj,Quad PF,6+ Mos 04/20/2013, 06/13/2014   Moderna SARS-COV2 Booster Vaccination 07/05/2020, 12/12/2020   Moderna Sars-Covid-2 Vaccination 08/08/2019, 09/05/2019   Pneumococcal Conjugate-13 10/31/2014   Pneumococcal Polysaccharide-23 10/07/2013   Td 10/07/2013   Zoster Recombinat (Shingrix) 09/03/2021, 11/12/2021   Zoster, Live 10/31/2014     Objective: Vital Signs: BP (!) 153/84 (BP Location: Left Arm, Patient Position: Sitting, Cuff Size: Normal)   Pulse 60   Resp 14   Ht 5' (1.524 m)   Wt 132 lb 9.6 oz (60.1 kg)   BMI 25.90 kg/m    Physical Exam Cardiovascular:     Rate and Rhythm: Normal rate and regular rhythm.  Pulmonary:     Effort: Pulmonary effort is normal.      Breath sounds: Normal breath sounds.  Musculoskeletal:     Right lower leg: No edema.     Left lower leg: No edema.  Skin:    General: Skin is warm and dry.  Neurological:     Mental Status: She is alert.     Motor: No weakness.  Psychiatric:        Mood and Affect: Mood normal.      Musculoskeletal Exam:  Shoulders full ROM no tenderness or swelling Elbows full ROM no tenderness or swelling Wrists full ROM no tenderness or swelling severe CMC joint squaring Fingers full ROM no tenderness or swelling Midline and paraspinal tenderness to pressure at lower thoracic and over lumbar spine Knees full ROM no tenderness or swelling   Investigation: No additional findings.  Imaging: DG Abdomen 1 View  Result Date: 05/14/2022 CLINICAL DATA:  Abdominal pain EXAM: ABDOMEN - 1 VIEW COMPARISON:  05/12/2022 FINDINGS: Nonobstructive bowel gas pattern. No abnormal fecal retention. No radio-opaque calculi or other significant radiographic abnormality are seen. Degenerative changes of the spine with multiple compression fractures at the thoracolumbar junction, better assessed on recent CT. IMPRESSION: Nonobstructive bowel gas pattern. Electronically Signed   By: Davina Poke D.O.   On: 05/14/2022 10:41   CT ABDOMEN PELVIS W CONTRAST  Result Date: 05/12/2022 CLINICAL DATA:  Abdominal pain, clinical suspicion for bowel obstruction EXAM: CT ABDOMEN AND PELVIS WITH CONTRAST TECHNIQUE: Multidetector CT imaging of the abdomen and pelvis was performed using the standard protocol following bolus administration of intravenous contrast. RADIATION DOSE REDUCTION: This exam was performed according to the departmental dose-optimization program which includes automated exposure control, adjustment of the mA and/or kV according to patient size and/or use of iterative reconstruction technique. CONTRAST:  74m OMNIPAQUE IOHEXOL 350 MG/ML SOLN COMPARISON:  12/24/2017 FINDINGS: Lower chest: In image 27 of  series 5, there is 7 mm nodule in the left lower lobe. In image 35, there is a 3 mm nodule in left lower lobe. These nodules appear stable. There is a subtle increase in interstitial markings in the periphery of both lower lung fields, more so on the left side suggesting scarring. There is mild ectasia of bronchi in the left lower lobe. Possible small coronary artery calcifications are seen. Hepatobiliary: There is fatty infiltration in liver. There is no dilation of bile ducts. Surgical clips are seen in gallbladder fossa. Pancreas: No focal abnormalities are seen.  Spleen: Unremarkable. Adrenals/Urinary Tract: Adrenals are not enlarged. There is no hydronephrosis. There are no renal or ureteral stones. There are few tiny low-density foci in renal cortex on both sides each measuring less than 3 mm in size, possibly cysts. Urinary bladder is not distended. Stomach/Bowel: Stomach is not distended. There is fluid in the lumen of small bowel loops with no significant dilation. Appendix is not seen. There is no pericecal inflammation. Few diverticula are seen in colon. There is no significant wall thickening in colon. There is no pericolic stranding. Vascular/Lymphatic: Scattered arterial calcifications are seen. No significant lymphadenopathy is seen. Reproductive: Uterus is not seen.  There are no adnexal masses. Other: There is no ascites or pneumoperitoneum. Small umbilical hernia containing fat is seen. Musculoskeletal: Compression fractures are seen in the bodies of T10, T11, T12 and L1 vertebrae. Fractures of T10 and T12 were not evident in the previous examination. There is no demonstrable break in the cortical margins. Alignment of posterior margins of vertebral bodies has not changed significantly. IMPRESSION: There is no evidence of intestinal obstruction of or pneumoperitoneum. There is no hydronephrosis. There is fluid in the lumen of small bowel loops with no significant dilation. This may be a normal  variation or suggest nonspecific enteritis. Fatty liver. Possible small coronary artery calcifications. Possible tiny bilateral renal cysts. Few diverticula in colon without signs of focal diverticulitis. Old compression fractures are seen in the bodies of T11 and L1 vertebrae. New compressions are noted in the bodies of T10 and T12 vertebrae. Age of the new fractures is not clear. Please correlate with physical examination findings. There are small nodular densities in left lower lobe with no significant change since 12/24/2017 suggesting possible granulomas. Scarring is seen in the lower lung fields, more so on the left side. Other findings as described in the body of the report. Electronically Signed   By: Elmer Picker M.D.   On: 05/12/2022 13:03    Recent Labs: Lab Results  Component Value Date   WBC 10.1 05/14/2022   HGB 14.4 05/14/2022   PLT 261 05/14/2022   NA 136 05/14/2022   K 3.7 05/14/2022   CL 101 05/14/2022   CO2 25 05/14/2022   GLUCOSE 111 (H) 05/14/2022   BUN 21 05/14/2022   CREATININE 0.79 05/14/2022   BILITOT 1.1 05/14/2022   ALKPHOS 50 05/14/2022   AST 24 05/14/2022   ALT 17 05/14/2022   PROT 7.2 05/14/2022   ALBUMIN 4.0 05/14/2022   CALCIUM 9.7 05/14/2022   GFRAA 87 03/12/2020    Speciality Comments: No specialty comments available.  Procedures:  No procedures performed Allergies: Patient has no known allergies.   Assessment / Plan:     Visit Diagnoses: Chronic pain of both shoulders PMR (polymyalgia rheumatica) ([facility])  Shoulder hip symptoms are currently doing well is still on the taper of systemic steroids.  I recommend if her shoulder pain returns after finishing this taper we could repeat subacromial bursa steroid injection on the left side as needed.  Acute bilateral low back pain with left-sided sciatica - Plan: traMADol (ULTRAM) 50 MG tablet  Increased back pain at the lower thoracic and lumbar spine this does not localize in a way that suggests  new acute bony injury or fracture suspect it is more exacerbation of existing injuries.  So far not well improved with the steroid taper.  She is not reporting much benefit with use of muscle relaxant.  She had recent significant constipation seen at the hospital within  the past few weeks so would avoid stronger opioid pain medication.  Recommend she could try taking low-dose tramadol at night to help with pain for sleep in the next few days as well instead of muscle relaxant if it is not helping. She has follow-up scheduled with PCP Dr. Quay Burow later this week.  I think if she is completing the steroid taper without seeing additional improvement in back pain may benefit to see orthopedic provider for additional options possibly local steroid injection.  She does not have a current provider, could refer to Dr. Ernestina Patches within [facility] or other local orthopedist office.  Orders: No orders of the defined types were placed in this encounter.  Meds ordered this encounter  Medications   traMADol (ULTRAM) 50 MG tablet    Sig: Take 1 tablet (50 mg total) by mouth every 12 (twelve) hours as needed.    Dispense:  10 tablet    Refill:  0     Follow-Up Instructions: Return if symptoms worsen or fail to improve.   Collier Salina, MD  Note - This record has been created using [employer].  Chart creation errors have been sought, but may not always  have been located. Such creation errors do not reflect on  the standard of medical care.

## 2022-05-14 ENCOUNTER — Encounter (HOSPITAL_COMMUNITY): Payer: Self-pay | Admitting: Emergency Medicine

## 2022-05-14 ENCOUNTER — Emergency Department (HOSPITAL_COMMUNITY)
Admission: EM | Admit: 2022-05-14 | Discharge: 2022-05-14 | Disposition: A | Discharge status: Home / Self Care | Payer: PPO | Attending: Emergency Medicine | Admitting: Emergency Medicine

## 2022-05-14 ENCOUNTER — Emergency Department (HOSPITAL_COMMUNITY): Payer: PPO

## 2022-05-14 ENCOUNTER — Telehealth: Payer: Self-pay

## 2022-05-14 ENCOUNTER — Other Ambulatory Visit: Payer: Self-pay

## 2022-05-14 DIAGNOSIS — R109 Unspecified abdominal pain: Secondary | ICD-10-CM | POA: Diagnosis not present

## 2022-05-14 DIAGNOSIS — M549 Dorsalgia, unspecified: Secondary | ICD-10-CM | POA: Diagnosis not present

## 2022-05-14 DIAGNOSIS — G8929 Other chronic pain: Secondary | ICD-10-CM

## 2022-05-14 DIAGNOSIS — R1084 Generalized abdominal pain: Secondary | ICD-10-CM | POA: Diagnosis not present

## 2022-05-14 DIAGNOSIS — R630 Anorexia: Secondary | ICD-10-CM | POA: Insufficient documentation

## 2022-05-14 DIAGNOSIS — M546 Pain in thoracic spine: Secondary | ICD-10-CM | POA: Diagnosis not present

## 2022-05-14 DIAGNOSIS — R141 Gas pain: Secondary | ICD-10-CM | POA: Diagnosis not present

## 2022-05-14 LAB — URINALYSIS, ROUTINE W REFLEX MICROSCOPIC
Bacteria, UA: NONE SEEN
Bilirubin Urine: NEGATIVE
Glucose, UA: NEGATIVE mg/dL
Ketones, ur: NEGATIVE mg/dL
Leukocytes,Ua: NEGATIVE
Nitrite: NEGATIVE
Protein, ur: NEGATIVE mg/dL
Specific Gravity, Urine: 1.008 (ref 1.005–1.030)
pH: 6 (ref 5.0–8.0)

## 2022-05-14 LAB — COMPREHENSIVE METABOLIC PANEL
ALT: 17 U/L (ref 0–44)
AST: 24 U/L (ref 15–41)
Albumin: 4 g/dL (ref 3.5–5.0)
Alkaline Phosphatase: 50 U/L (ref 38–126)
Anion gap: 10 (ref 5–15)
BUN: 21 mg/dL (ref 8–23)
CO2: 25 mmol/L (ref 22–32)
Calcium: 9.7 mg/dL (ref 8.9–10.3)
Chloride: 101 mmol/L (ref 98–111)
Creatinine, Ser: 0.79 mg/dL (ref 0.44–1.00)
GFR, Estimated: >60 mL/min (ref >60)
Glucose, Bld: 111 mg/dL — ABNORMAL HIGH (ref 70–99)
Potassium: 3.7 mmol/L (ref 3.5–5.1)
Sodium: 136 mmol/L (ref 135–145)
Total Bilirubin: 1.1 mg/dL (ref 0.3–1.2)
Total Protein: 7.2 g/dL (ref 6.5–8.1)

## 2022-05-14 LAB — CBC
HCT: 42 % (ref 36.0–46.0)
Hemoglobin: 14.4 g/dL (ref 12.0–15.0)
MCH: 30.5 pg (ref 26.0–34.0)
MCHC: 34.3 g/dL (ref 30.0–36.0)
MCV: 89 fL (ref 80.0–100.0)
Platelets: 261 10*3/uL (ref 150–400)
RBC: 4.72 MIL/uL (ref 3.87–5.11)
RDW: 12.9 % (ref 11.5–15.5)
WBC: 10.1 10*3/uL (ref 4.0–10.5)
nRBC: 0 % (ref 0.0–0.2)

## 2022-05-14 LAB — LIPASE, BLOOD: Lipase: 40 U/L (ref 11–51)

## 2022-05-14 MED ORDER — BISACODYL 10 MG RE SUPP
10.0000 mg | Freq: Once | RECTAL | Status: AC
  Administered 2022-05-14: 10 mg via RECTAL
  Filled 2022-05-14: qty 1

## 2022-05-14 MED ORDER — PEG 3350-KCL-NABCB-NACL-NASULF 240 G PO SOLR: 4000.0000 mL | Freq: Once | ORAL | 0 refills | Status: AC

## 2022-05-14 MED ORDER — FLEET ENEMA 7-19 GM/118ML RE ENEM: 1.0000 | ENEMA | Freq: Once | RECTAL | Status: DC

## 2022-05-14 MED ORDER — SIMETHICONE 80 MG PO CHEW
160.0000 mg | CHEWABLE_TABLET | Freq: Once | ORAL | Status: AC
  Administered 2022-05-14: 160 mg via ORAL
  Filled 2022-05-14: qty 2

## 2022-05-14 MED ORDER — SENNOSIDES-DOCUSATE SODIUM 8.6-50 MG PO TABS: 1.0000 | ORAL_TABLET | Freq: Every evening | ORAL | 0 refills | Status: AC | PRN

## 2022-05-14 MED ORDER — FLEET ENEMA 7-19 GM/118ML RE ENEM: 1.0000 | ENEMA | Freq: Once | RECTAL | 0 refills | Status: AC

## 2022-05-14 NOTE — Progress Notes (Signed)
Orthopedic Tech Progress Note Patient Details:  [name] [name] 10-26-1940 [id number]  Called in order to [employer] for a TLSO BRACE,  Patient ID: [name] [name], female   DOB: 07/25/1941, 81 y.o.   MRN: [id number]  Janit Pagan 05/14/2022, 11:57 AM

## 2022-05-14 NOTE — Telephone Encounter (Signed)
Pt is current at hospital   Nurse Assessment Nurse: Rayburn Felt, RN, Colletta [place] Date/Time Eilene Ghazi Time): 05/14/2022 1:00:23 AM Confirm and document reason for call. If symptomatic, describe symptoms. ---Caller stated that she is having bilateral upper abdominal pain and bilateral lower back pain. Caller stated that she took a muscle relaxer around 10 pm. Abdominal pain 9-10/10 constant, back pain 10/10. Caller denies any other symptoms. Does the patient have any new or worsening symptoms? ---Yes Will a triage be completed? ---Yes Related visit to physician within the last 2 weeks? ---Yes Does the PT have any chronic conditions? (i.e. diabetes, asthma, this includes High risk factors for pregnancy, etc.) ---Yes List chronic conditions. ---HTN, sees Rheumo Is this a behavioral health or substance abuse call? ---No Guidelines Guideline Title Affirmed Question Affirmed Notes Nurse Date/Time (Eastern Time) Back Pain  [1] SEVERE abdominal pain  [2] present > 1 hour [place], RN, Colletta [place] 05/14/2022 1:05:34 AM Abdominal Pain - Upper [1] SEVERE pain (e.g., excruciating) [2] present > 1 hour Hembree, RN, Stephanie 05/14/2022 1:07:41 AM  Disp. Time Eilene Ghazi Time) Disposition Final User 05/14/2022 12:58:57 AM Send to Urgent Queue Ethlyn Gallery 05/14/2022 1:07:18 AM Go to ED Now (or PCP triage) Rayburn Felt, RN, Colletta [place] 05/14/2022 1:09:17 AM Go to ED Now Yes Rayburn Felt, RN, Colletta [place] Final Disposition 05/14/2022 1:09:17 AM Go to ED Now Yes Rayburn Felt, RN, Ellouise Newer Disagree/Comply Comply Caller Understands Yes PreDisposition Did not know what to do Care Advice Given Per Guideline GO TO ED NOW (OR PCP TRIAGE): BRING MEDICINES: * Bring a list of your current medicines when you go to the Emergency Department (ER). * Bring the pill bottles too. This will help the doctor (or NP/PA) to make certain you are taking the right medicines and the right dose. ANOTHER ADULT SHOULD DRIVE: * It is better and  safer if another adult drives instead of you. CARE ADVICE given per Back Pain (Adult) guideline. GO TO ED NOW: * You need to be seen in the Emergency Department. * Leave now. Drive carefully. NOTE TO TRIAGER - DRIVING: * Another adult should drive. * Patient should not delay going to the emergency department. * If immediate transportation is not available via car, rideshare (e.g., Lyft, Uber), or taxi, then the patient should be instructed to call [id number]. BRING MEDICINES: * Bring a list of your current medicines when you go to the Emergency Department (ER). * Bring the pill bottles too. This will help the doctor (or NP/PA) to make certain you are taking the right medicines and the right dose. NOTHING BY MOUTH: * Do not eat or drink anything for now. CARE ADVICE given per Abdominal Pain - Upper (Adult) guideline. Comments User: Elmer Picker, RN Date/Time Eilene Ghazi Time): 05/14/2022 1:06:26 AM Caller stated that she was seen at the [facility] and ED Sat and Monday for abdominal and back pain. Referrals [facility] - ED

## 2022-05-14 NOTE — ED Provider Notes (Signed)
[place] EMERGENCY DEPARTMENT Provider Note   CSN: [id number] Arrival date & time: 05/14/22  0152     History  Chief Complaint  Patient presents with   Abdominal Pain    [name] [name] is a 81 y.o. female, history of constipation, appendectomy, who presents to the ED secondary to abdominal pain since Thursday night.  States that about a week ago, she was struggling with some constipation and it took 4 days for her to have a bowel movement, states when it was a bowel movement, it was very Marigene Erler.  Took 6 doses of MiraLAX at night with little result--states she had a Ileah Falkenstein BM that was very watery, but no other bowel movements.  Next day she had 1 BM, that was Elaria Osias and solid.  States that she feels very bloated, as reduced appetite, and now reports epigastric, flank, and generalized abdominal pain.  No nausea or vomiting.  Has passed some gas, but less than usual.  Reports that she had a CT on Monday, and it was fairly unremarkable.     Home Medications Prior to Admission medications   Medication Sig Start Date End Date Taking? Authorizing Provider  acetaminophen (TYLENOL) 650 MG CR tablet Take 650 mg by mouth every 8 (eight) hours as needed for pain.    [provider]  Biotin 5 MG CAPS Taking daily 11/01/15   Binnie Rail, MD  bisoprolol-hydrochlorothiazide [facility]) 5-6.25 MG tablet TAKE 1 TABLET BY MOUTH EVERY DAY 04/04/22   Binnie Rail, MD  CALCIUM CITRATE-VITAMIN D PO Take 600 mg by mouth in the morning and at bedtime.    [provider]  denosumab (PROLIA) 60 MG/ML SOLN injection Inject 60 mg into the skin every 6 (six) months. Administer in upper arm, thigh, or abdomen    [provider]  famotidine (PEPCID) 20 MG tablet Take 20 mg by mouth daily.    [provider]  levothyroxine (SYNTHROID) 50 MCG tablet TAKE 1 TABLET BY MOUTH EVERY DAY 01/17/22   Burns, Claudina Lick, MD  lidocaine (HM LIDOCAINE PATCH) 4 % Place 1 patch onto the skin  daily for 7 days. 05/12/22 05/19/22  Godfrey Pick, MD  methocarbamol (ROBAXIN) 500 MG tablet Take 1 tablet (500 mg total) by mouth every 8 (eight) hours as needed for muscle spasms. 05/12/22   Godfrey Pick, MD  Multiple Vitamins-Minerals (CENTRUM SILVER PO) Take by mouth daily.    [provider]  omeprazole (PRILOSEC) 20 MG capsule TAKE 1 CAPSULE BY MOUTH EVERY DAY 12/30/21   Burns, Claudina Lick, MD  polyethylene glycol (MIRALAX / GLYCOLAX) 17 g packet Take 17 g by mouth daily. 2 capfuls daily 05/10/22   Nyoka Lint, PA-C  pravastatin (PRAVACHOL) 40 MG tablet TAKE 1 TABLET BY MOUTH EVERY DAY 04/01/22   Binnie Rail, MD  predniSONE (STERAPRED UNI-PAK 21 TAB) 10 MG (21) TBPK tablet Take by mouth daily. Take 6 tabs by mouth daily  for 2 days, then 5 tabs for 2 days, then 4 tabs for 2 days, then 3 tabs for 2 days, 2 tabs for 2 days, then 1 tab by mouth daily for 2 days 05/12/22   Godfrey Pick, MD  PROLIA 60 MG/ML SOSY injection INJECT 60 MG INTO THE SKIN ONCE FOR 1 DOSE 10/22/21   Binnie Rail, MD  triamcinolone cream (KENALOG) 0.1 % APPLY A THIN LAYER TO THE AFFECTED AREA(S) BY TOPICAL ROUTE 2 TIMES PER DAY    [provider]  Wheat Dextrin (BENEFIBER) POWD Twice daily 05/01/17   Binnie Rail, MD      Allergies    Patient has no known allergies.    Review of Systems   Review of Systems  Constitutional:  Positive for chills. Negative for fever.  Respiratory:  Negative for shortness of breath.   Cardiovascular:  Negative for chest pain.  Gastrointestinal:  Positive for abdominal pain, constipation and diarrhea. Negative for blood in stool, nausea and vomiting.    Physical Exam Updated Vital Signs BP 135/67 (BP Location: Right Arm)   Pulse 75   Temp 98.1 F (36.7 C) (Oral)   Resp 16   SpO2 96%  Physical Exam Constitutional:      Appearance: She is well-developed.  HENT:     Head: Normocephalic.     Mouth/Throat:     Mouth: Mucous membranes are moist.  Cardiovascular:      Rate and Rhythm: Normal rate and regular rhythm.  Pulmonary:     Effort: Pulmonary effort is normal.     Breath sounds: Normal breath sounds.  Abdominal:     General: Bowel sounds are normal.     Palpations: Abdomen is soft.     Comments: +bloated, but soft. Generalized TTP w/o guarding or rebound.   Musculoskeletal:        General: Normal range of motion.  Skin:    General: Skin is warm and dry.     Capillary Refill: Capillary refill takes less than 2 seconds.  Neurological:     General: No focal deficit present.     Mental Status: She is alert and oriented to person, place, and time.  Psychiatric:        Mood and Affect: Mood normal.     ED Results / Procedures / Treatments   Labs (all labs ordered are listed, but only abnormal results are displayed) Labs Reviewed  COMPREHENSIVE METABOLIC PANEL - Abnormal; Notable for the following components:      Result Value   Glucose, Bld 111 (*)    All other components within normal limits  URINALYSIS, ROUTINE W REFLEX MICROSCOPIC - Abnormal; Notable for the following components:   Color, Urine STRAW (*)    Hgb urine dipstick Samia Kukla (*)    All other components within normal limits  LIPASE, BLOOD  CBC    EKG None  Radiology CT ABDOMEN PELVIS W CONTRAST  Result Date: 05/12/2022 CLINICAL DATA:  Abdominal pain, clinical suspicion for bowel obstruction EXAM: CT ABDOMEN AND PELVIS WITH CONTRAST TECHNIQUE: Multidetector CT imaging of the abdomen and pelvis was performed using the standard protocol following bolus administration of intravenous contrast. RADIATION DOSE REDUCTION: This exam was performed according to the departmental dose-optimization program which includes automated exposure control, adjustment of the mA and/or kV according to patient size and/or use of iterative reconstruction technique. CONTRAST:  19m OMNIPAQUE IOHEXOL 350 MG/ML SOLN COMPARISON:  12/24/2017 FINDINGS: Lower chest: In image 27 of series 5, there is 7 mm  nodule in the left lower lobe. In image 35, there is a 3 mm nodule in left lower lobe. These nodules appear stable. There is a subtle increase in interstitial markings in the periphery of both lower lung fields, more so on the left side suggesting scarring. There is mild ectasia of bronchi in the left lower lobe. Possible Schon Zeiders coronary artery calcifications are seen. Hepatobiliary: There is fatty infiltration in liver. There is no dilation of bile ducts. Surgical clips are seen in gallbladder fossa. Pancreas: No focal  abnormalities are seen. Spleen: Unremarkable. Adrenals/Urinary Tract: Adrenals are not enlarged. There is no hydronephrosis. There are no renal or ureteral stones. There are few tiny low-density foci in renal cortex on both sides each measuring less than 3 mm in size, possibly cysts. Urinary bladder is not distended. Stomach/Bowel: Stomach is not distended. There is fluid in the lumen of Synai Prettyman bowel loops with no significant dilation. Appendix is not seen. There is no pericecal inflammation. Few diverticula are seen in colon. There is no significant wall thickening in colon. There is no pericolic stranding. Vascular/Lymphatic: Scattered arterial calcifications are seen. No significant lymphadenopathy is seen. Reproductive: Uterus is not seen.  There are no adnexal masses. Other: There is no ascites or pneumoperitoneum. Emlyn Maves umbilical hernia containing fat is seen. Musculoskeletal: Compression fractures are seen in the bodies of T10, T11, T12 and L1 vertebrae. Fractures of T10 and T12 were not evident in the previous examination. There is no demonstrable break in the cortical margins. Alignment of posterior margins of vertebral bodies has not changed significantly. IMPRESSION: There is no evidence of intestinal obstruction of or pneumoperitoneum. There is no hydronephrosis. There is fluid in the lumen of Nolita Kutter bowel loops with no significant dilation. This may be a normal variation or suggest  nonspecific enteritis. Fatty liver. Possible Verl Kitson coronary artery calcifications. Possible tiny bilateral renal cysts. Few diverticula in colon without signs of focal diverticulitis. Old compression fractures are seen in the bodies of T11 and L1 vertebrae. New compressions are noted in the bodies of T10 and T12 vertebrae. Age of the new fractures is not clear. Please correlate with physical examination findings. There are Marcelo Ickes nodular densities in left lower lobe with no significant change since 12/24/2017 suggesting possible granulomas. Scarring is seen in the lower lung fields, more so on the left side. Other findings as described in the body of the report. Electronically Signed   By: Elmer Picker M.D.   On: 05/12/2022 13:03    Procedures Procedures    Medications Ordered in ED Medications - No data to display  ED Course/ Medical Decision Making/ A&P                           Medical Decision Making Patient is a 81 year old female, here for back pain, abdominal pain.  Was previously evaluated on 10/16 with a CT abdomen, showed multiple compression fractures.  She states since being seen she has had a bowel movement, painful, and bloated.  Is passing gas, but not as much as usual.  Denies any nausea or vomiting.  States she has generalized abdominal pain.  She denies any chest pain or shortness of breath.  EKG, KUB, labs obtained for further evaluation.  She is not distended, and has soft abdomen, and recently had a CT abdomen pelvis no acute abdominal findings warranting CT.  Amount and/or Complexity of Data Reviewed Labs: ordered.    Details: Labs are unremarkable.  No urinary tract infection or electrolyte abnormalities. Radiology: ordered.    Details: KUB is nonobstructive bowel pattern, with some stool, no impactions. ECG/medicine tests: ordered.    Details: Changes from 2010 present, however no ST elevation or depression Discussion of management or test interpretation with  external provider(s): Discussed with patient, she had a Madaleine Simmon bowel movement, passing gas, with some relief of her abdominal discomfort, thus unlikely that pain is from cardiac etiology.  Discharged with GoLytely as needed, and to trial enema first.  Discussed follow-up with  PCP.  Placed TLSO brace secondary to back pain, and compression fractures noted on 10/16, she had improvement with her pain.  Discussed follow-up with PCP, and return precautions, patient voiced understanding.  Symptoms are likely secondary to constipation/gas pain, and chronic back pain from compression fractures.  Risk OTC drugs. Prescription drug management.    Final Clinical Impression(s) / ED Diagnoses Final diagnoses:  None    Rx / DC Orders ED Discharge Orders     None         Adryanna Friedt, Si Gaul, PA 05/14/22 1401    Sherwood Gambler, MD 05/19/22 2303

## 2022-05-14 NOTE — Discharge Instructions (Addendum)
Follow-up with your primary care doctor.  Take Golytely prescription if no relief with enema.  Also take senna as prescribed. If you develop worsening pain, shortness of breath, abdominal pain, please return to the ED.

## 2022-05-14 NOTE — Telephone Encounter (Signed)
Patient at ED now  °

## 2022-05-14 NOTE — ED Triage Notes (Signed)
Patient reports mid abdominal pain with constipation onset this week , no emesis or diarrhea , denies fever or chills.

## 2022-05-14 NOTE — ED Notes (Signed)
Patient Alert and oriented to baseline. Stable and ambulatory to baseline. Patient verbalized understanding of the discharge instructions.  Patient belongings were taken by the patient.   

## 2022-05-20 ENCOUNTER — Ambulatory Visit: Payer: PPO | Admitting: Internal Medicine

## 2022-05-20 ENCOUNTER — Encounter: Payer: Self-pay | Admitting: Internal Medicine

## 2022-05-20 ENCOUNTER — Ambulatory Visit: Payer: PPO | Attending: Internal Medicine | Admitting: Internal Medicine

## 2022-05-20 VITALS — BP 153/84 | HR 60 | Resp 14 | Ht 60.0 in | Wt 132.6 lb

## 2022-05-20 DIAGNOSIS — M25511 Pain in right shoulder: Secondary | ICD-10-CM | POA: Diagnosis not present

## 2022-05-20 DIAGNOSIS — M5442 Lumbago with sciatica, left side: Secondary | ICD-10-CM | POA: Diagnosis not present

## 2022-05-20 DIAGNOSIS — G8929 Other chronic pain: Secondary | ICD-10-CM

## 2022-05-20 DIAGNOSIS — M353 Polymyalgia rheumatica: Secondary | ICD-10-CM | POA: Diagnosis not present

## 2022-05-20 DIAGNOSIS — M25512 Pain in left shoulder: Secondary | ICD-10-CM | POA: Diagnosis not present

## 2022-05-20 MED ORDER — TRAMADOL HCL 50 MG PO TABS: 50.0000 mg | ORAL_TABLET | Freq: Two times a day (BID) | ORAL | 0 refills | Status: DC | PRN

## 2022-05-21 NOTE — Progress Notes (Unsigned)
    Subjective:    Patient ID: [name] [name], female    DOB: Dec 02, 1940, 81 y.o.   MRN: [id number]      HPI [name] [name] is here for No chief complaint on file.   Constipated -    Pain in back -      Medications and allergies reviewed with patient and updated if appropriate.  Current Outpatient Medications on File Prior to Visit  Medication Sig Dispense Refill   acetaminophen (TYLENOL) 650 MG CR tablet Take 650 mg by mouth every 8 (eight) hours as needed for pain.     Biotin 5 MG CAPS Taking daily  0   bisoprolol-hydrochlorothiazide (ZIAC) 5-6.25 MG tablet TAKE 1 TABLET BY MOUTH EVERY DAY 90 tablet 1   CALCIUM CITRATE-VITAMIN D PO Take 600 mg by mouth in the morning and at bedtime.     denosumab (PROLIA) 60 MG/ML SOLN injection Inject 60 mg into the skin every 6 (six) months. Administer in upper arm, thigh, or abdomen     famotidine (PEPCID) 20 MG tablet Take 20 mg by mouth daily.     levothyroxine (SYNTHROID) 50 MCG tablet TAKE 1 TABLET BY MOUTH EVERY DAY 90 tablet 3   methocarbamol (ROBAXIN) 500 MG tablet Take 1 tablet (500 mg total) by mouth every 8 (eight) hours as needed for muscle spasms. 20 tablet 0   Multiple Vitamins-Minerals (CENTRUM SILVER PO) Take by mouth daily.     omeprazole (PRILOSEC) 20 MG capsule TAKE 1 CAPSULE BY MOUTH EVERY DAY (Patient taking differently: as needed.) 90 capsule 3   polyethylene glycol (MIRALAX / GLYCOLAX) 17 g packet Take 17 g by mouth daily. 2 capfuls daily 14 each 0   pravastatin (PRAVACHOL) 40 MG tablet TAKE 1 TABLET BY MOUTH EVERY DAY 90 tablet 0   predniSONE (STERAPRED UNI-PAK 21 TAB) 10 MG (21) TBPK tablet Take by mouth daily. Take 6 tabs by mouth daily  for 2 days, then 5 tabs for 2 days, then 4 tabs for 2 days, then 3 tabs for 2 days, 2 tabs for 2 days, then 1 tab by mouth daily for 2 days 42 tablet 0   PROLIA 60 MG/ML SOSY injection INJECT 60 MG INTO THE SKIN ONCE FOR 1 DOSE 60 mL 1   traMADol (ULTRAM) 50 MG tablet Take 1 tablet (50 mg total)  by mouth every 12 (twelve) hours as needed. 10 tablet 0   triamcinolone cream (KENALOG) 0.1 % as needed.     Wheat Dextrin (BENEFIBER) POWD Twice daily  0   No current facility-administered medications on file prior to visit.    Review of Systems     Objective:  There were no vitals filed for this visit. BP Readings from Last 3 Encounters:  05/20/22 (!) 153/84  05/14/22 (!) 145/69  05/12/22 (!) 143/68   Wt Readings from Last 3 Encounters:  05/20/22 132 lb 9.6 oz (60.1 kg)  05/12/22 136 lb 11 oz (62 kg)  04/02/22 137 lb 3.2 oz (62.2 kg)   There is no height or weight on file to calculate BMI.    Physical Exam         Assessment & Plan:    See Problem List for Assessment and Plan of chronic medical problems.

## 2022-05-22 ENCOUNTER — Ambulatory Visit (INDEPENDENT_AMBULATORY_CARE_PROVIDER_SITE_OTHER): Payer: PPO | Admitting: Internal Medicine

## 2022-05-22 ENCOUNTER — Encounter: Payer: Self-pay | Admitting: Internal Medicine

## 2022-05-22 VITALS — BP 132/76 | HR 62 | Temp 98.0°F | Ht 60.0 in | Wt 132.4 lb

## 2022-05-22 DIAGNOSIS — K5900 Constipation, unspecified: Secondary | ICD-10-CM

## 2022-05-22 DIAGNOSIS — R1010 Upper abdominal pain, unspecified: Secondary | ICD-10-CM

## 2022-05-22 DIAGNOSIS — S22070A Wedge compression fracture of T9-T10 vertebra, initial encounter for closed fracture: Secondary | ICD-10-CM | POA: Diagnosis not present

## 2022-05-22 DIAGNOSIS — M8000XA Age-related osteoporosis with current pathological fracture, unspecified site, initial encounter for fracture: Secondary | ICD-10-CM | POA: Diagnosis not present

## 2022-05-22 DIAGNOSIS — I10 Essential (primary) hypertension: Secondary | ICD-10-CM | POA: Diagnosis not present

## 2022-05-22 NOTE — Assessment & Plan Note (Signed)
New Previous known fractures T11, T12 and L1-T10 fracture is new and this is what I think is causing her pain. I think there was some thought that maybe she was experiencing lumbar radiculopathy/sciatica and she was given prednisone which has not helped.  She asked about a prednisone injection She is tender in the lower thoracic spine and does not have any radiculopathy ?  Eligible for kyphoplasty Discussed the importance of pain management-advised to try tramadol-even half a pill initially to see how she does with that, but I expect her to do fine with it.  Discussed to take Tylenol with this Stressed good bowel regimen and adjusting her regimen if needed Refer to Ortho care-evaluation of her back pain and?  Kyphoplasty Continue Prolia, calcium and vitamin D

## 2022-05-22 NOTE — Assessment & Plan Note (Signed)
Chronic Blood pressure well controlled despite pain Continue current medications-Ziac 5-6.25 mg daily

## 2022-05-22 NOTE — Patient Instructions (Addendum)
    Tylenol - you can take up to 3000 mg / day    Medications changes include :   try the tramadol 25 mg - 50 mg three times a day as needed for pain.  Take tylenol with it.    Keep up with your bowel regimen  A referral was ordered for [facility].     Someone will call you to schedule an appointment.    Return if symptoms worsen or fail to improve.

## 2022-05-22 NOTE — Assessment & Plan Note (Signed)
Acute Experiencing intermittent upper abdominal pain Her bowels are controlled and she is not currently constipated I think her pain is radiation from the thoracic spine-T10 fracture possibly No pain on exam today CT abdomen pelvis from the emergency room reviewed and reassuring She will monitor and treat symptomatically

## 2022-05-22 NOTE — Assessment & Plan Note (Signed)
Chronic Compression fractures of T10 (new), T11, T12 and L1 Continue Prolia 60 mg every 6 months Continue calcium and vitamin D daily Discussed importance of avoiding steroids when possible

## 2022-05-22 NOTE — Assessment & Plan Note (Signed)
Acute on chronic Bowel regimen currently controlling her constipation-having bowel movement daily Continue MiraLAX twice daily, fiber supplementation and increased water intake Discussed that she may need to adjust her regimen as needed

## 2022-05-25 NOTE — Telephone Encounter (Signed)
Last Prolia inj 05/07/22 Next Prolia inj due 11/07/22  

## 2022-05-26 ENCOUNTER — Ambulatory Visit (INDEPENDENT_AMBULATORY_CARE_PROVIDER_SITE_OTHER): Payer: PPO

## 2022-05-26 ENCOUNTER — Encounter: Payer: Self-pay | Admitting: Orthopedic Surgery

## 2022-05-26 ENCOUNTER — Ambulatory Visit (INDEPENDENT_AMBULATORY_CARE_PROVIDER_SITE_OTHER): Payer: PPO | Admitting: Orthopedic Surgery

## 2022-05-26 VITALS — BP 145/75 | HR 66 | Ht 60.0 in | Wt 132.5 lb

## 2022-05-26 DIAGNOSIS — M546 Pain in thoracic spine: Secondary | ICD-10-CM

## 2022-05-26 MED ORDER — CALCITONIN (SALMON) 200 UNIT/ACT NA SOLN: 1.0000 | Freq: Every day | NASAL | Status: DC

## 2022-05-26 NOTE — Progress Notes (Signed)
Orthopedic Spine Surgery Office Note  Assessment: Patient is a 81 y.o. female with multiple thoracic compression fractures. One at T10 appears acute   Plan: -Explained that initially conservative treatment is tried as a significant number of patients may experience relief with these treatment modalities. Discussed that the conservative treatments include:  -activity modification  -physical therapy  -over the counter pain medications  -intranasal calcitonin -Talked about the fact that she has already being treated for osteoporosis for prolia. She explained that she has been on it for several years. She is going to inquire her primary care provider about a holiday -Patient has tried activity modification, back brace, tylenol, tramadol  -Recommended she use tylenol and ibuprofen and to minimize the tramadol due to her constipation. Prescribed her calcitonin intranasal.  -Patient should return to office in 4 weeks, repeat x-rays of lumbar spine at next visit: thoracic AP and lateral   Patient expressed understanding of the plan and all questions were answered to the patient's satisfaction.   ___________________________________________________________________________   History:  Patient is a 81 y.o. female who presents today for thoracic spine.  Patient has history of compression fractures and noted recent onset of thoracic back pain.  Started about 2 and half weeks ago.  There is no trauma or injury that brought on the pain.  She feels that it sometimes radiates along her inferior chest wall.  This symptom is more significant on the right side.  The back pain is more constant than the pain radiating into her chest and upper abdominal wall.  She has been having  bloating and constipation within the last couple of weeks. She is using miralax to help with this and it has improved.    Weakness: denies Symptoms of imbalance: denies Paresthesias and numbness: denies Bowel or bladder incontinence:  yes, chronic multi-year history of urinary incontinence. No new urinary symptoms. Has had bowel incontinence when she takes too much miralax. Has not had any recent bowel incontinence.  Saddle anesthesia: denies  Treatments tried: tylenol, activity modification  Review of systems: Denies fevers and chills, night sweats, unexplained weight loss, history of cancer, pain that wakes them at night  Past medical history: HLD HTN Osteoporosis GERD Polymyalgia rheumatica   Allergies: NKDA  Past surgical history:  Cholecystectomy Hysterectomy Appendectomy  Social history: Denies use of nicotine product (smoking, vaping, patches, smokeless) Alcohol use: denies  Denies recreational drug use   Physical Exam:  General: no acute distress, appears stated age Neurologic: alert, answering questions appropriately, following commands Respiratory: unlabored breathing on room air, symmetric chest rise Psychiatric: appropriate affect, normal cadence to speech   MSK (spine):  -Strength exam      Left  Right EHL    4/5  4/5 TA    5/5  5/5 GSC    5/5  5/5 Knee extension  5/5  5/5 Hip flexion   5/5  5/5  -Sensory exam    Sensation intact to light touch in L3-S1 nerve distributions of bilateral lower extremities  -Achilles DTR: 1/4 on the left, 1/4 on the right -Patellar tendon DTR: 1/4 on the left, 1/4 on the right  Normal gait, no instability with tandem gait  Negative hoffman bilaterally  Increased thoracic kyphosis, increased sagittal balance on inspection  Imaging: XR of the lumbar spine from 05/26/2022 was independently reviewed and interpreted, showing multiple lower thoracic compression fractures. No dislocation or spondylolisthesis. Increased thoracic kyphosis.     Patient name: [name] [name] Patient MRN: [id number] Date of visit:  05/26/22  

## 2022-05-27 ENCOUNTER — Telehealth: Payer: Self-pay | Admitting: Orthopedic Surgery

## 2022-05-27 ENCOUNTER — Telehealth: Payer: Self-pay | Admitting: Internal Medicine

## 2022-05-27 ENCOUNTER — Telehealth: Payer: Self-pay

## 2022-05-27 MED ORDER — CALCITONIN (SALMON) 200 UNIT/ACT NA SOLN: 1.0000 | Freq: Every day | NASAL | 12 refills | Status: DC

## 2022-05-27 NOTE — Telephone Encounter (Signed)
Patient called advised her Rx is still not showing received at the pharmacy. Patient asked for a call when Rx is sent to the pharmacy.  The number to contact patient is [phone]

## 2022-05-27 NOTE — Telephone Encounter (Signed)
Patient called stating that she was advised not to take the Rx for Calcitonin,salmon nasal spray if she is on Prolia.  Would like to know what she needs to do?  CB# [phone].  Please advise.  Thank you.

## 2022-05-27 NOTE — Telephone Encounter (Signed)
Patient called went to appointment yesterday and was prescribed a nasal spray and pharmacy told her it was in conflict with a current medication. Would like to know how to proceed. Patient would like a call back [phone]

## 2022-05-27 NOTE — Telephone Encounter (Signed)
I called and advised her med was sent to the pharmacy

## 2022-05-28 NOTE — Telephone Encounter (Signed)
Left message for a call back to verify which medication is interfering with a current medication.

## 2022-05-29 ENCOUNTER — Ambulatory Visit: Payer: PPO | Admitting: Orthopedic Surgery

## 2022-05-29 NOTE — Telephone Encounter (Signed)
Patient decided not to take the nasal spray that Dr. Laurance Flatten prescribed - she said the pharmacist told her that it would interfere with her prolia.

## 2022-05-31 ENCOUNTER — Other Ambulatory Visit: Payer: Self-pay | Admitting: Internal Medicine

## 2022-05-31 DIAGNOSIS — M5442 Lumbago with sciatica, left side: Secondary | ICD-10-CM

## 2022-06-01 NOTE — Progress Notes (Unsigned)
    Subjective:    Patient ID: [name] [name], female    DOB: September 12, 1940, 81 y.o.   MRN: [id number]      HPI [name] [name] is here for No chief complaint on file.    T10 compression fracture - saw orthopedics.  He prescribed calcitonin.  She is on prolia - last injection was 05/07/22.      Medications and allergies reviewed with patient and updated if appropriate.  Current Outpatient Medications on File Prior to Visit  Medication Sig Dispense Refill   acetaminophen (TYLENOL) 650 MG CR tablet Take 650 mg by mouth every 8 (eight) hours as needed for pain.     Biotin 5 MG CAPS Taking daily  0   bisoprolol-hydrochlorothiazide (ZIAC) 5-6.25 MG tablet TAKE 1 TABLET BY MOUTH EVERY DAY 90 tablet 1   calcitonin, salmon, (MIACALCIN) 200 UNIT/ACT nasal spray Place 1 spray into alternate nostrils daily. 3.7 mL 12   CALCIUM CITRATE-VITAMIN D PO Take 600 mg by mouth in the morning and at bedtime.     denosumab (PROLIA) 60 MG/ML SOLN injection Inject 60 mg into the skin every 6 (six) months. Administer in upper arm, thigh, or abdomen     famotidine (PEPCID) 20 MG tablet Take 20 mg by mouth daily.     levothyroxine (SYNTHROID) 50 MCG tablet TAKE 1 TABLET BY MOUTH EVERY DAY 90 tablet 3   methocarbamol (ROBAXIN) 500 MG tablet Take 1 tablet (500 mg total) by mouth every 8 (eight) hours as needed for muscle spasms. 20 tablet 0   Multiple Vitamins-Minerals (CENTRUM SILVER PO) Take by mouth daily.     omeprazole (PRILOSEC) 20 MG capsule TAKE 1 CAPSULE BY MOUTH EVERY DAY (Patient taking differently: as needed.) 90 capsule 3   polyethylene glycol (MIRALAX / GLYCOLAX) 17 g packet Take 17 g by mouth daily. 2 capfuls daily 14 each 0   pravastatin (PRAVACHOL) 40 MG tablet TAKE 1 TABLET BY MOUTH EVERY DAY 90 tablet 0   predniSONE (STERAPRED UNI-PAK 21 TAB) 10 MG (21) TBPK tablet Take by mouth daily. Take 6 tabs by mouth daily  for 2 days, then 5 tabs for 2 days, then 4 tabs for 2 days, then 3 tabs for 2 days, 2 tabs for 2  days, then 1 tab by mouth daily for 2 days 42 tablet 0   PROLIA 60 MG/ML SOSY injection INJECT 60 MG INTO THE SKIN ONCE FOR 1 DOSE 60 mL 1   traMADol (ULTRAM) 50 MG tablet Take 1 tablet (50 mg total) by mouth every 12 (twelve) hours as needed. 10 tablet 0   triamcinolone cream (KENALOG) 0.1 % as needed.     Wheat Dextrin (BENEFIBER) POWD Twice daily  0   No current facility-administered medications on file prior to visit.    Review of Systems     Objective:  There were no vitals filed for this visit. BP Readings from Last 3 Encounters:  05/26/22 (!) 145/75  05/22/22 132/76  05/20/22 (!) 153/84   Wt Readings from Last 3 Encounters:  05/26/22 132 lb 8 oz (60.1 kg)  05/22/22 132 lb 6.4 oz (60.1 kg)  05/20/22 132 lb 9.6 oz (60.1 kg)   There is no height or weight on file to calculate BMI.    Physical Exam         Assessment & Plan:    See Problem List for Assessment and Plan of chronic medical problems.

## 2022-06-02 ENCOUNTER — Ambulatory Visit: Payer: PPO

## 2022-06-02 NOTE — Telephone Encounter (Signed)
Next Visit: Due 08/20/2022. Message sent to the front to schedule.  Last Visit: 05/20/2022  UDS: Not on file  Narc Agreement: Not on file  Last Fill: 05/20/2022  Okay to refill tramadol?

## 2022-06-03 ENCOUNTER — Encounter: Payer: Self-pay | Admitting: Internal Medicine

## 2022-06-03 ENCOUNTER — Ambulatory Visit (INDEPENDENT_AMBULATORY_CARE_PROVIDER_SITE_OTHER): Payer: PPO | Admitting: Internal Medicine

## 2022-06-03 VITALS — BP 138/78 | HR 70 | Temp 98.1°F | Ht 60.0 in | Wt 131.0 lb

## 2022-06-03 DIAGNOSIS — M8000XD Age-related osteoporosis with current pathological fracture, unspecified site, subsequent encounter for fracture with routine healing: Secondary | ICD-10-CM

## 2022-06-03 DIAGNOSIS — K219 Gastro-esophageal reflux disease without esophagitis: Secondary | ICD-10-CM

## 2022-06-03 DIAGNOSIS — S22070A Wedge compression fracture of T9-T10 vertebra, initial encounter for closed fracture: Secondary | ICD-10-CM | POA: Diagnosis not present

## 2022-06-03 DIAGNOSIS — I10 Essential (primary) hypertension: Secondary | ICD-10-CM | POA: Diagnosis not present

## 2022-06-03 NOTE — Patient Instructions (Addendum)
      An order for a steroid injection in the back was ordered.     A referral was ordered for Dr Layne Benton.

## 2022-06-03 NOTE — Assessment & Plan Note (Addendum)
Subacute-pain started 4 weeks ago No injury that caused the compression fracture On Prolia since 07/2015, last injection 04/2022 Taking calcium and vitamin D, last vitamin D level was very good She has had compression fractures in the past - T11, T12, L1  which may have influenced this fracture Wants a steroid  injection which has significantly helped her in the past  - will order Has f/u with [facility]

## 2022-06-03 NOTE — Assessment & Plan Note (Signed)
Chronic BP well controlled Continue bisoprolol-hctz 5-6.25 mg daily

## 2022-06-03 NOTE — Telephone Encounter (Signed)
Patient states she would rather not schedule 3 month follow up if she doesn't need to. Do you need her to be scheduled up for a follow-up around 08/20/2022?

## 2022-06-03 NOTE — Assessment & Plan Note (Signed)
Chronic Has been on prolia x 5 years - 07/2015 - now -- last injection 04/2022 Had compression fracture recently on prolia - ? Change treatment Continue calcium, vitamin d - vitamin d level has been good Will refer to Dr Layne Benton  - ? Change in treatment  - best treatment at this time

## 2022-06-03 NOTE — Assessment & Plan Note (Addendum)
Chronic GERD controlled - taking more advil - monitor  Continue omeprazole 20 mg daily, pepcid 20 mg daily

## 2022-06-09 NOTE — Telephone Encounter (Signed)
Patient advised per Dr. Benjamine Mola that is reasonable since her PMR is doing well and recent pain more related to her back. But we will not be able to continue prescribing controlled substances such as tramadol without regular follow up. Patient expressed understanding.

## 2022-06-10 ENCOUNTER — Other Ambulatory Visit: Payer: PPO

## 2022-06-11 ENCOUNTER — Ambulatory Visit
Admission: RE | Admit: 2022-06-11 | Discharge: 2022-06-11 | Disposition: A | Discharge status: Home / Self Care | Payer: PPO | Source: Ambulatory Visit | Attending: Internal Medicine | Admitting: Internal Medicine

## 2022-06-11 DIAGNOSIS — M5410 Radiculopathy, site unspecified: Secondary | ICD-10-CM | POA: Diagnosis not present

## 2022-06-11 DIAGNOSIS — S22070A Wedge compression fracture of T9-T10 vertebra, initial encounter for closed fracture: Secondary | ICD-10-CM

## 2022-06-11 DIAGNOSIS — M4854XA Collapsed vertebra, not elsewhere classified, thoracic region, initial encounter for fracture: Secondary | ICD-10-CM | POA: Diagnosis not present

## 2022-06-11 MED ORDER — TRIAMCINOLONE ACETONIDE 40 MG/ML IJ SUSP (RADIOLOGY)
60.0000 mg | Freq: Once | INTRAMUSCULAR | Status: AC
  Administered 2022-06-11: 60 mg via EPIDURAL

## 2022-06-11 MED ORDER — IOPAMIDOL (ISOVUE-M 300) INJECTION 61%
1.0000 mL | Freq: Once | INTRAMUSCULAR | Status: AC
  Administered 2022-06-11: 1 mL via EPIDURAL

## 2022-06-11 NOTE — Discharge Instructions (Signed)

## 2022-06-16 ENCOUNTER — Telehealth: Payer: Self-pay | Admitting: Internal Medicine

## 2022-06-16 DIAGNOSIS — M5442 Lumbago with sciatica, left side: Secondary | ICD-10-CM

## 2022-06-16 MED ORDER — TRAMADOL HCL 50 MG PO TABS: 50.0000 mg | ORAL_TABLET | Freq: Two times a day (BID) | ORAL | 0 refills | Status: AC | PRN

## 2022-06-16 NOTE — Telephone Encounter (Signed)
Caller: ( PATIENT),  Relationship: ( SELF)  Call Back Number: [phone]  Date of Last Office Visit: 06/03/22  Date of Next Office Visit: 10/01/22  Medication(s) to be Refilled: TRAMADOL For back pain. ESI has worn off  Last Written:  06/02/22 Dr. Vernelle Emerald   Preferred Pharmacy: [id number] [place], [place] [id number] [id number]: [phone] Fax: [phone]

## 2022-06-16 NOTE — Telephone Encounter (Signed)
Patient called the office to refresh her memory on the call last week in regards to the tramadol. I explained what is noted below to the patient and the patient requested a refill of tramadol. I then spoke with Dr. Benjamine Mola and patient should request the tramadol from her PCP or the orthopedist who is managing the vertebral fracture since Dr. Benjamine Mola only sees her for PMR. Patient verbalized understanding and will contact her PCP. Patient will call our office as needed for a follow up.

## 2022-06-23 ENCOUNTER — Ambulatory Visit: Payer: PPO | Admitting: Orthopedic Surgery

## 2022-06-26 ENCOUNTER — Telehealth: Payer: Self-pay | Admitting: Internal Medicine

## 2022-06-26 NOTE — Telephone Encounter (Signed)
Patient called and said she got an injection in her back about two weeks ago and was told to give it about 2 weeks to see how it does. She states she is still in pain, she contacted the doctor who gave her the injection they recommended she follow up with her primary. A good callback number for her is [phone].

## 2022-06-27 NOTE — Telephone Encounter (Signed)
Spoke with patient today and she will follow with ortho.

## 2022-06-30 ENCOUNTER — Ambulatory Visit: Payer: PPO

## 2022-06-30 ENCOUNTER — Ambulatory Visit
Admission: RE | Admit: 2022-06-30 | Discharge: 2022-06-30 | Disposition: A | Discharge status: Home / Self Care | Payer: PPO | Source: Ambulatory Visit | Attending: Internal Medicine | Admitting: Internal Medicine

## 2022-06-30 DIAGNOSIS — Z1231 Encounter for screening mammogram for malignant neoplasm of breast: Secondary | ICD-10-CM

## 2022-07-09 DIAGNOSIS — E559 Vitamin D deficiency, unspecified: Secondary | ICD-10-CM | POA: Diagnosis not present

## 2022-07-09 DIAGNOSIS — M810 Age-related osteoporosis without current pathological fracture: Secondary | ICD-10-CM | POA: Diagnosis not present

## 2022-07-09 DIAGNOSIS — R5383 Other fatigue: Secondary | ICD-10-CM | POA: Diagnosis not present

## 2022-07-09 DIAGNOSIS — M546 Pain in thoracic spine: Secondary | ICD-10-CM | POA: Diagnosis not present

## 2022-07-10 ENCOUNTER — Other Ambulatory Visit: Payer: Self-pay | Admitting: Internal Medicine

## 2022-07-10 DIAGNOSIS — I700 Atherosclerosis of aorta: Secondary | ICD-10-CM

## 2022-07-10 DIAGNOSIS — E7849 Other hyperlipidemia: Secondary | ICD-10-CM

## 2022-07-16 DIAGNOSIS — M810 Age-related osteoporosis without current pathological fracture: Secondary | ICD-10-CM | POA: Diagnosis not present

## 2022-07-16 DIAGNOSIS — E559 Vitamin D deficiency, unspecified: Secondary | ICD-10-CM | POA: Diagnosis not present

## 2022-08-11 ENCOUNTER — Ambulatory Visit: Payer: PPO | Admitting: Internal Medicine

## 2022-08-18 ENCOUNTER — Ambulatory Visit (INDEPENDENT_AMBULATORY_CARE_PROVIDER_SITE_OTHER): Payer: PPO

## 2022-08-18 VITALS — Wt 131.0 lb

## 2022-08-18 DIAGNOSIS — Z0000 Encounter for general adult medical examination without abnormal findings: Secondary | ICD-10-CM

## 2022-08-18 NOTE — Progress Notes (Signed)
Subjective:   [name] [name] is a 82 y.o. female who presents for Medicare Annual (Subsequent) preventive examination.  I connected with  [name] on 08/18/22 by a audio enabled telemedicine application and verified that I am speaking with the correct person using two identifiers.  Patient Location: Home  Provider Location: Home Office  I discussed the limitations of evaluation and management by telemedicine. The patient expressed understanding and agreed to proceed.   Review of Systems     Cardiac Risk Factors include: advanced age (>60mn, >>41women);sedentary lifestyle;hypertension;dyslipidemia;Other (see comment), Risk factor comments: atherosclerosis     Objective:    Today's Vitals   08/18/22 1605 08/18/22 1607  Weight: 131 lb (59.4 kg)   PainSc:  4    Body mass index is 25.58 kg/m.     08/18/2022    4:25 PM 05/14/2022    2:29 AM 05/12/2022    6:43 PM 09/06/2021    8:14 AM 08/16/2021    3:47 PM 08/08/2020   11:57 AM 11/30/2019   12:39 PM  Advanced Directives  Does Patient Have a Medical Advance Directive? Yes No No Yes Yes No No  Type of AParamedicof AFinleyLiving will   HUpper [place] will H[place]Living will    Does patient want to make changes to medical advance directive?     No - Patient declined    Copy of HCromwellin Chart? No - copy requested    No - copy requested    Would patient like information on creating a medical advance directive?   No - Guardian declined;No - Patient declined   No - Patient declined No - Patient declined    Current Medications (verified) Outpatient Encounter Medications as of 08/18/2022  Medication Sig   acetaminophen (TYLENOL) 650 MG CR tablet Take 650 mg by mouth every 8 (eight) hours as needed for pain.   Biotin 5 MG CAPS Taking daily   bisoprolol-hydrochlorothiazide (ZIAC) 5-6.25 MG tablet TAKE 1 TABLET BY MOUTH EVERY DAY   CALCIUM  CITRATE-VITAMIN D PO Take 600 mg by mouth in the morning and at bedtime.   famotidine (PEPCID) 20 MG tablet Take 20 mg by mouth daily.   levothyroxine (SYNTHROID) 50 MCG tablet TAKE 1 TABLET BY MOUTH EVERY DAY   Multiple Vitamins-Minerals (CENTRUM SILVER PO) Take by mouth daily.   polyethylene glycol (MIRALAX / GLYCOLAX) 17 g packet Take 17 g by mouth daily. 2 capfuls daily   pravastatin (PRAVACHOL) 40 MG tablet TAKE 1 TABLET BY MOUTH EVERY DAY   traMADol (ULTRAM) 50 MG tablet Take 1 tablet (50 mg total) by mouth every 12 (twelve) hours as needed for severe pain.   TYMLOS 3120 MCG/1.56ML SOPN SMARTSIG:1 Pre-Filled Pen Syringe SUB-Q Every Evening   Wheat Dextrin (BENEFIBER) POWD Twice daily   calcitonin, salmon, (MIACALCIN) 200 UNIT/ACT nasal spray Place 1 spray into alternate nostrils daily. (Patient not taking: Reported on 08/18/2022)   denosumab (PROLIA) 60 MG/ML SOLN injection Inject 60 mg into the skin every 6 (six) months. Administer in upper arm, thigh, or abdomen (Patient not taking: Reported on 08/18/2022)   omeprazole (PRILOSEC) 20 MG capsule TAKE 1 CAPSULE BY MOUTH EVERY DAY (Patient not taking: Reported on 08/18/2022)   PROLIA 60 MG/ML SOSY injection INJECT 60 MG INTO THE SKIN ONCE FOR 1 DOSE (Patient not taking: Reported on 08/18/2022)   No facility-administered encounter medications on file as of 08/18/2022.    Allergies (  verified) Patient has no known allergies.   History: Past Medical History:  Diagnosis Date   Benign neoplasm of colon    Cataract    bilateral,had surgery   DDD (degenerative disc disease), lumbar    MRI w. [facility] 05/2015, s/p ESI x 1 w/ improvement   Diaphragmatic hernia without mention of obstruction or gangrene    Diverticulosis of colon (without mention of hemorrhage)    DJD (degenerative joint disease) of knee    L>R, IA cortisone by [facility] in L 03/2015   Esophageal reflux    Gallstones    History of chicken pox    Hyperlipidemia    Hypertension     Irritable bowel syndrome    Migraines    Obesity, unspecified    Osteoporosis, unspecified    Tachycardia    Unspecified hemorrhoids with other complication    Past Surgical History:  Procedure Laterality Date   APPENDECTOMY  1979   CATARACT EXTRACTION     CHOLECYSTECTOMY  1989   COLONOSCOPY     POLYPECTOMY     VAGINAL HYSTERECTOMY  1996   Family History  Problem Relation Age of Onset   Stroke Mother    Dementia Mother    Stroke Father    Heart disease Sister        x 2   Colon cancer Sister    Stroke Sister 71   Heart disease Brother    Social History   Socioeconomic History   Marital status: Married    Spouse name: Chrissie Noa   Number of children: 2   Years of education: Not on file   Highest education level: Not on file  Occupational History   Occupation: retired  Tobacco Use   Smoking status: Never   Smokeless tobacco: Never  Vaping Use   Vaping Use: Never used  Substance and Sexual Activity   Alcohol use: No    Alcohol/week: 0.0 standard drinks of alcohol   Drug use: No   Sexual activity: Not Currently  Other Topics Concern   Not on file  Social History Narrative   Exercise: no regular exercise   One daughter in [place], one in [place] Strain: Low Risk  (08/18/2022)   Overall Financial Resource Strain (CARDIA)    Difficulty of Paying Living Expenses: Not hard at all  Food Insecurity: No Food Insecurity (08/18/2022)   Hunger Vital Sign    Worried About Running Out of Food in the Last Year: Never true    Ran Out of Food in the Last Year: Never true  Transportation Needs: No Transportation Needs (08/18/2022)   PRAPARE - Hydrologist (Medical): No    Lack of Transportation (Non-Medical): No  Physical Activity: Insufficiently Active (08/18/2022)   Exercise Vital Sign    Days of Exercise per Week: 7 days    Minutes of Exercise per Session: 20 min  Stress: No Stress  Concern Present (08/18/2022)   [place]    Feeling of Stress : Not at all  Social Connections: [place] (08/18/2022)   Social Connection and Isolation Panel [NHANES]    Frequency of Communication with Friends and Family: More than three times a week    Frequency of Social Gatherings with Friends and Family: More than three times a week    Attends Religious Services: More than 4 times per year    Active  Member of Clubs or Organizations: Yes    Attends Music therapist: More than 4 times per year    Marital Status: Married    Tobacco Counseling Counseling given: Not Answered   Clinical Intake:  Pre-visit preparation completed: Yes  Pain : 0-10 Pain Score: 4  Pain Type: Chronic pain Pain Location: Back Pain Orientation: Lower Pain Descriptors / Indicators: Aching Pain Onset: More than a month ago Pain Frequency: Intermittent     BMI - recorded: 25.58 Nutritional Risks: None Diabetes: No  How often do you need to have someone help you when you read instructions, pamphlets, or other written materials from your doctor or pharmacy?: 1 - Never  Diabetic? no  Interpreter Needed?: No  Information entered by :: Octa Uplinger, LPN   Activities of Daily Living    08/18/2022    4:24 PM  In your present state of health, do you have any difficulty performing the following activities:  Hearing? 1  Vision? 0  Difficulty concentrating or making decisions? 1  Comment sometimes  Walking or climbing stairs? 1  Comment back pain - holds rails  Dressing or bathing? 0  Doing errands, shopping? 0  Preparing Food and eating ? N  Using the Toilet? N  In the past six months, have you accidently leaked urine? Y  Comment mild intermittent  Do you have problems with loss of bowel control? N  Managing your Medications? N  Managing your Finances? N  Housekeeping or managing your Housekeeping? N     Patient Care Team: Binnie Rail, MD as PCP - General (Internal Medicine) Molli Posey, MD (Obstetrics and Gynecology) Luberta Mutter, MD (Ophthalmology) Szabat, Darnelle Maffucci, [facility] (Inactive) as Pharmacist (Pharmacist) Verner Chol, MD as Consulting Physician (Orthopedic Surgery) Druscilla Brownie, MD as Referring Physician (Dermatology)  Indicate any recent Medical Services you may have received from other than [facility] providers in the past year (date may be approximate).     Assessment:   This is a routine wellness examination for [facility].  Hearing/Vision screen Hearing Screening - Comments:: Hard of hearing. No hearing aids. Says she sees audiology Vision Screening - Comments:: Wears rx glasses - up to date with routine eye exams with [facility]  Dietary issues and exercise activities discussed: Current Exercise Habits: Home exercise routine, Type of exercise: walking;Other - see comments (house work), Time (Minutes): 15, Frequency (Times/Week): 7, Weekly Exercise (Minutes/Week): 105, Intensity: Mild, Exercise limited by: orthopedic condition(s)   Goals Addressed               This Visit's Progress     Patient Stated (pt-stated)   On track     My goal is pain management for PMR. Wants to be able to do all her work independently        Depression Screen    08/18/2022    4:22 PM 06/03/2022   12:00 PM 05/22/2022   10:00 AM 04/02/2022    9:12 AM 08/16/2021    3:52 PM 08/08/2020   11:56 AM 03/12/2020   11:14 AM  PHQ 2/9 Scores  PHQ - 2 Score 0 3 0 0 0 0 0  PHQ- 9 Score  '7 3 1       '$ Fall Risk    08/18/2022    4:14 PM 06/03/2022   12:00 PM 05/22/2022   10:00 AM 04/02/2022    9:11 AM 08/16/2021    3:51 PM  [place] in the past year? 0 0 0  0 1  Number falls in past yr: 0 0 0 0 0  Injury with Fall? 0 0 0 0 0  Risk for fall due to : Orthopedic patient No Fall Risks No Fall Risks No Fall Risks No Fall Risks  Follow up Falls prevention discussed;Education  provided Falls evaluation completed Falls evaluation completed Falls evaluation completed Falls evaluation completed    [place]:  Any stairs in or around the home? Yes  If so, are there any without handrails? No  Home free of loose throw rugs in walkways, pet beds, electrical cords, etc? Yes  Adequate lighting in your home to reduce risk of falls? Yes   ASSISTIVE DEVICES UTILIZED TO PREVENT FALLS:  Life alert? No  Use of a cane, walker or w/c?  No, but she holds onto things as she walks Grab bars in the bathroom? Yes  Shower chair or bench in shower?  Yes, but not using it Elevated toilet seat or a handicapped toilet? Yes   TIMED UP AND GO:  Was the test performed? No . Telehealth visit  Cognitive Function:    12/01/2017    1:35 PM 11/26/2016    9:35 AM  MMSE - Mini Mental State Exam  Orientation to time 5 5  Orientation to Place 5 5  Registration 3 3  Attention/ Calculation 4 5  Recall 3 2  Language- name 2 objects 2 2  Language- repeat 1 1  Language- follow 3 step command 3 3  Language- read & follow direction 1 1  Write a sentence 1 1  Copy design 1 1  Total score 29 29        08/18/2022    4:28 PM  6CIT Screen  What Year? 0 points  What month? 0 points  What time? 0 points  Count back from 20 0 points  Months in reverse 0 points  Repeat phrase 2 points  Total Score 2 points    Immunizations Immunization History  Administered Date(s) Administered   Fluad Quad(high Dose 65+) 05/12/2019, 06/11/2020, 07/03/2021, 05/01/2022   Influenza Split 05/28/2011, 06/23/2012   Influenza Whole 05/25/2007, 05/31/2010   Influenza, High Dose Seasonal PF 06/27/2015, 04/30/2016, 05/01/2017, 05/04/2018   Influenza,inj,Quad PF,6+ Mos 04/20/2013, 06/13/2014   Moderna SARS-COV2 Booster Vaccination 07/05/2020, 12/12/2020   Moderna Sars-Covid-2 Vaccination 08/08/2019, 09/05/2019   Pneumococcal Conjugate-13 10/31/2014   Pneumococcal  Polysaccharide-23 10/07/2013   Td 10/07/2013   Zoster Recombinat (Shingrix) 09/03/2021, 11/12/2021   Zoster, Live 10/31/2014    TDAP status: Up to date  Flu Vaccine status: Up to date  Pneumococcal vaccine status: Up to date  Covid-19 vaccine status: Completed vaccines  Qualifies for Shingles Vaccine? Yes   Zostavax completed Yes   Shingrix Completed?: Yes  Screening Tests Health Maintenance  Topic Date Due   COVID-19 Vaccine (5 - 2023-24 season) 03/28/2022   MAMMOGRAM  07/01/2023   Medicare Annual Wellness (AWV)  08/19/2023   DTaP/Tdap/Td (2 - Tdap) 10/08/2023   DEXA SCAN  04/03/2024   Pneumonia Vaccine 94+ Years old  Completed   INFLUENZA VACCINE  Completed   Zoster Vaccines- Shingrix  Completed   HPV VACCINES  Aged Out   COLONOSCOPY (Pts 45-51yr Insurance coverage will need to be confirmed)  Discontinued    Health Maintenance  Health Maintenance Due  Topic Date Due   COVID-19 Vaccine (5 - 2023-24 season) 03/28/2022    Colorectal cancer screening: No longer required.   Mammogram status: Completed 06/30/2022. Repeat every  year  Bone Density status: Completed 04/03/2022. Results reflect: Bone density results: OSTEOPOROSIS. Repeat every 2 years.  Lung Cancer Screening: (Low Dose CT Chest recommended if Age 64-80 years, 30 pack-year currently smoking OR have quit w/in 15years.) does not qualify.   Additional Screening:  Hepatitis C Screening: does not qualify  Vision Screening: Recommended annual ophthalmology exams for early detection of glaucoma and other disorders of the eye. Is the patient up to date with their annual eye exam?  Yes  Who is the provider or what is the name of the office in which the patient attends annual eye exams? [facility] If pt is not established with a provider, would they like to be referred to a provider to establish care? No .   Dental Screening: Recommended annual dental exams for proper oral hygiene  Community Resource Referral /  Chronic Care Management: CRR required this visit?  No   CCM required this visit?  No      Plan:     I have personally reviewed and noted the following in the patient's chart:   Medical and social history Use of alcohol, tobacco or illicit drugs  Current medications and supplements including opioid prescriptions. Patient is currently taking opioid prescriptions. Information provided to patient regarding non-opioid alternatives. Patient advised to discuss non-opioid treatment plan with their provider. Functional ability and status Nutritional status Physical activity Advanced directives List of other physicians Hospitalizations, surgeries, and ER visits in previous 12 months Vitals Screenings to include cognitive, depression, and falls Referrals and appointments  In addition, I have reviewed and discussed with patient certain preventive protocols, quality metrics, and best practice recommendations. A written personalized care plan for preventive services as well as general preventive health recommendations were provided to patient.     Sandrea Hammond, LPN   5/57/3220   Nurse Notes: Has had a few episodes of tachycardia, but not lately - hasn't seen cardiologist in close to 10 years.

## 2022-08-18 NOTE — Patient Instructions (Signed)
[name] [name] , Thank you for taking time to come for your Medicare Wellness Visit. I appreciate your ongoing commitment to your health goals. Please review the following plan we discussed and let me know if I can assist you in the future.   These are the goals we discussed:  Goals       Manage My Medicine      Timeframe:  Long-Range Goal Priority:  Medium Start Date:      12/17/20                      Expected End Date:  07/19/22                     Follow Up Date 12/26/2021   - call for medicine refill 2 or 3 days before it runs out - call if I am sick and can't take my medicine - keep a list of all the medicines I take; vitamins and herbals too - use a pillbox to sort medicine    Why is this important?   These steps will help you keep on track with your medicines.        Patient Stated (pt-stated)      My goal is pain management for PMR. Wants to be able to do all her work independently       [name] [name] (see longitudinal plan of care for additional care plan information)  Current Barriers:  Chronic Disease Management support, education, and care coordination needs related to Hypertension, Hyperlipidemia, and GERD   Hypertension BP Readings from Last 3 Encounters:  11/18/19 140/78  11/07/19 (!) 156/84  08/02/19 (!) 166/84  Pharmacist Clinical Goal(s): Over the next 180 days, patient will work with PharmD and providers to maintain BP goal <140/90 Current regimen:  bisoprolol/HCTZ 5/6.25 mg daily Interventions: Discussed white coat hypertension Discussed BP goals and benefits of medications for prevention of heart attack / stroke Patient self care activities - Over the next 180 days, patient will: Check BP daily, document, and provide at future appointments Ensure daily salt intake < 2300 mg/day  Hyperlipidemia Lab Results  Component Value Date/Time   LDLCALC 119 (H) 01/11/2019 10:19 AM   LDLDIRECT 158.0 06/27/2015 11:24 AM  Pharmacist  Clinical Goal(s): Over the next 180 days, patient will work with PharmD and providers to achieve LDL goal < 100 Current regimen:  Pravastatin 20 mg daily Interventions: Discussed cholesterol goals and benefits of medication for prevention of heart attack / stroke Trial off of pravastatin led to no improvement in leg pain; discussed pravastatin is not the cause and patient should resume medication Patient self care activities - Over the next 180 days, patient will: Continue current medications Continue low cholesterol diet  Heartburn / GERD Pharmacist Clinical Goal(s) Over the next 180 days, patient will work with PharmD and providers to optimize therapy Current regimen:  famotidine 20 mg daily,  omeprazole 20 mg daily,  Interventions: Discussed optimal timing for famotidine (Pepcid) and omeprazole Patient self care activities - Over the next 180 days, patient will: Take Famotidine 15-30 min before a meal for optimal benefit  Medication management Pharmacist Clinical Goal(s): Over the next 180 days, patient will work with PharmD and providers to maintain optimal medication adherence Current pharmacy: [facility] Interventions Comprehensive medication review performed. Continue current medication management strategy Patient self care activities - Over the next 180 days, patient will: Focus on medication adherence by  fill date Take medications as prescribed Report any questions or concerns to PharmD and/or provider(s)  Please see past updates related to this goal by clicking on the "Past Updates" button in the selected goal         This is a list of the screening recommended for you and due dates:  Health Maintenance  Topic Date Due   COVID-19 Vaccine (5 - 2023-24 season) 03/28/2022   Mammogram  07/01/2023   Medicare Annual Wellness Visit  08/19/2023   DTaP/Tdap/Td vaccine (2 - Tdap) 10/08/2023   DEXA scan (bone density measurement)  04/03/2024   Pneumonia Vaccine  Completed    Flu Shot  Completed   Zoster (Shingles) Vaccine  Completed   HPV Vaccine  Aged Out   Colon Cancer Screening  Discontinued    Advanced directives: Please bring a copy of your health care power of attorney and living will to the office to be added to your chart at your convenience.   Conditions/risks identified: Be careful not to fall. Consider using a cane and a shower chair. Let us know if you have any episodes of tachycardia.  Next appointment: Follow up in one year for your annual wellness visit    Preventive Care 65 Years and Older, Female Preventive care refers to lifestyle choices and visits with your health care provider that can promote health and wellness. What does preventive care include? A yearly physical exam. This is also called an annual well check. Dental exams once or twice a year. Routine eye exams. Ask your health care provider how often you should have your eyes checked. Personal lifestyle choices, including: Daily care of your teeth and gums. Regular physical activity. Eating a healthy diet. Avoiding tobacco and drug use. Limiting alcohol use. Practicing safe sex. Taking low-dose aspirin every day. Taking vitamin and mineral supplements as recommended by your health care provider. What happens during an annual well check? The services and screenings done by your health care provider during your annual well check will depend on your age, overall health, lifestyle risk factors, and family history of disease. Counseling  Your health care provider may ask you questions about your: Alcohol use. Tobacco use. Drug use. Emotional well-being. Home and relationship well-being. Sexual activity. Eating habits. History of falls. Memory and ability to understand (cognition). Work and work Statistician. Reproductive health. Screening  You may have the following tests or measurements: Height, weight, and BMI. Blood pressure. Lipid and cholesterol levels. These may be  checked every 5 years, or more frequently if you are over 51 years old. Skin check. Lung cancer screening. You may have this screening every year starting at age 28 if you have a 30-pack-year history of smoking and currently smoke or have quit within the past 15 years. Fecal occult blood test (FOBT) of the stool. You may have this test every year starting at age 23. Flexible sigmoidoscopy or colonoscopy. You may have a sigmoidoscopy every 5 years or a colonoscopy every 10 years starting at age 65. Hepatitis C blood test. Hepatitis B blood test. Sexually transmitted disease (STD) testing. Diabetes screening. This is done by checking your blood sugar (glucose) after you have not eaten for a while (fasting). You may have this done every 1-3 years. Bone density scan. This is done to screen for osteoporosis. You may have this done starting at age 47. Mammogram. This may be done every 1-2 years. Talk to your health care provider about how often you should have regular mammograms. Talk with  your health care provider about your test results, treatment options, and if necessary, the need for more tests. Vaccines  Your health care provider may recommend certain vaccines, such as: Influenza vaccine. This is recommended every year. Tetanus, diphtheria, and acellular pertussis (Tdap, Td) vaccine. You may need a Td booster every 10 years. Zoster vaccine. You may need this after age 73. Pneumococcal 13-valent conjugate (PCV13) vaccine. One dose is recommended after age 21. Pneumococcal polysaccharide (PPSV23) vaccine. One dose is recommended after age 76. Talk to your health care provider about which screenings and vaccines you need and how often you need them. This information is not intended to replace advice given to you by your health care provider. Make sure you discuss any questions you have with your health care provider. Document Released: 08/10/2015 Document Revised: 04/02/2016 Document Reviewed:  05/15/2015 Elsevier Interactive Patient Education  2017 [place] Prevention in the Home Falls can cause injuries. They can happen to people of all ages. There are many things you can do to make your home safe and to help prevent falls. What can I do on the outside of my home? Regularly fix the edges of walkways and driveways and fix any cracks. Remove anything that might make you trip as you walk through a door, such as a raised step or threshold. Trim any bushes or trees on the path to your home. Use bright outdoor lighting. Clear any walking paths of anything that might make someone trip, such as rocks or tools. Regularly check to see if handrails are loose or broken. Make sure that both sides of any steps have handrails. Any raised decks and porches should have guardrails on the edges. Have any leaves, snow, or ice cleared regularly. Use sand or salt on walking paths during winter. Clean up any spills in your garage right away. This includes oil or grease spills. What can I do in the bathroom? Use night lights. Install grab bars by the toilet and in the tub and shower. Do not use towel bars as grab bars. Use non-skid mats or decals in the tub or shower. If you need to sit down in the shower, use a plastic, non-slip stool. Keep the floor dry. Clean up any water that spills on the floor as soon as it happens. Remove soap buildup in the tub or shower regularly. Attach bath mats securely with double-sided non-slip rug tape. Do not have throw rugs and other things on the floor that can make you trip. What can I do in the bedroom? Use night lights. Make sure that you have a light by your bed that is easy to reach. Do not use any sheets or blankets that are too big for your bed. They should not hang down onto the floor. Have a firm chair that has side arms. You can use this for support while you get dressed. Do not have throw rugs and other things on the floor that can make you  trip. What can I do in the kitchen? Clean up any spills right away. Avoid walking on wet floors. Keep items that you use a lot in easy-to-reach places. If you need to reach something above you, use a strong step stool that has a grab bar. Keep electrical cords out of the way. Do not use floor polish or wax that makes floors slippery. If you must use wax, use non-skid floor wax. Do not have throw rugs and other things on the floor that can make you trip.  What can I do with my stairs? Do not leave any items on the stairs. Make sure that there are handrails on both sides of the stairs and use them. Fix handrails that are broken or loose. Make sure that handrails are as long as the stairways. Check any carpeting to make sure that it is firmly attached to the stairs. Fix any carpet that is loose or worn. Avoid having throw rugs at the top or bottom of the stairs. If you do have throw rugs, attach them to the floor with carpet tape. Make sure that you have a light switch at the top of the stairs and the bottom of the stairs. If you do not have them, ask someone to add them for you. What else can I do to help prevent falls? Wear shoes that: Do not have high heels. Have rubber bottoms. Are comfortable and fit you well. Are closed at the toe. Do not wear sandals. If you use a stepladder: Make sure that it is fully opened. Do not climb a closed stepladder. Make sure that both sides of the stepladder are locked into place. Ask someone to hold it for you, if possible. Clearly mark and make sure that you can see: Any grab bars or handrails. First and last steps. Where the edge of each step is. Use tools that help you move around (mobility aids) if they are needed. These include: Canes. Walkers. Scooters. Crutches. Turn on the lights when you go into a dark area. Replace any light bulbs as soon as they burn out. Set up your furniture so you have a clear path. Avoid moving your furniture  around. If any of your floors are uneven, fix them. If there are any pets around you, be aware of where they are. Review your medicines with your doctor. Some medicines can make you feel dizzy. This can increase your chance of falling. Ask your doctor what other things that you can do to help prevent falls. This information is not intended to replace advice given to you by your health care provider. Make sure you discuss any questions you have with your health care provider. Document Released: 05/10/2009 Document Revised: 12/20/2015 Document Reviewed: 08/18/2014 Elsevier Interactive Patient Education  2017 Elsevier Inc[employer]   Managing Pain Without Opioids Opioids are strong medicines used to treat moderate to severe pain. For some people, especially those who have long-term (chronic) pain, opioids may not be the best choice for pain management due to: Side effects like nausea, constipation, and sleepiness. The risk of addiction (opioid use disorder). The longer you take opioids, the greater your risk of addiction. Pain that lasts for more than 3 months is called chronic pain. Managing chronic pain usually requires more than one approach and is often provided by a team of health care providers working together (multidisciplinary approach). Pain management may be done at a pain management center or pain clinic. How to manage pain without the use of opioids Use non-opioid medicines Non-opioid medicines for pain may include: Over-the-counter or prescription non-steroidal anti-inflammatory drugs (NSAIDs). These may be the first medicines used for pain. They work well for muscle and bone pain, and they reduce swelling. Acetaminophen. This over-the-counter medicine may work well for milder pain but not swelling. Antidepressants. These may be used to treat chronic pain. A certain type of antidepressant (tricyclics) is often used. These medicines are given in lower doses for pain than when used for  depression. Anticonvulsants. These are usually used to treat seizures but may  also reduce nerve (neuropathic) pain. Muscle relaxants. These relieve pain caused by sudden muscle tightening (spasms). You may also use a pain medicine that is applied to the skin as a patch, cream, or gel (topical analgesic), such as a numbing medicine. These may cause fewer side effects than medicines taken by mouth. Do certain therapies as directed Some therapies can help with pain management. They include: Physical therapy. You will do exercises to gain strength and flexibility. A physical therapist may teach you exercises to move and stretch parts of your body that are weak, stiff, or painful. You can learn these exercises at physical therapy visits and practice them at home. Physical therapy may also involve: Massage. Heat wraps or applying heat or cold to affected areas. Electrical signals that interrupt pain signals (transcutaneous electrical nerve stimulation, TENS). Weak lasers that reduce pain and swelling (low-level laser therapy). Signals from your body that help you learn to regulate pain (biofeedback). Occupational therapy. This helps you to learn ways to function at home and work with less pain. Recreational therapy. This involves trying new activities or hobbies, such as a physical activity or drawing. Mental health therapy, including: Cognitive behavioral therapy (CBT). This helps you learn coping skills for dealing with pain. Acceptance and commitment therapy (ACT) to change the way you think and react to pain. Relaxation therapies, including muscle relaxation exercises and mindfulness-based stress reduction. Pain management counseling. This may be individual, family, or group counseling.  Receive medical treatments Medical treatments for pain management include: Nerve block injections. These may include a pain blocker and anti-inflammatory medicines. You may have injections: Near the spine to  relieve chronic back or neck pain. Into joints to relieve back or joint pain. Into nerve areas that supply a painful area to relieve body pain. Into muscles (trigger point injections) to relieve some painful muscle conditions. A medical device placed near your spine to help block pain signals and relieve nerve pain or chronic back pain (spinal cord stimulation device). Acupuncture. Follow these instructions at home Medicines Take over-the-counter and prescription medicines only as told by your health care provider. If you are taking pain medicine, ask your health care providers about possible side effects to watch out for. Do not drive or use heavy machinery while taking prescription opioid pain medicine. Lifestyle  Do not use drugs or alcohol to reduce pain. If you drink alcohol, limit how much you have to: 0-1 drink a day for women who are not pregnant. 0-2 drinks a day for men. Know how much alcohol is in a drink. In the [place] one drink equals one 12 oz bottle of beer (355 mL), one 5 oz glass of wine (148 mL), or one 1 oz glass of hard liquor (44 mL). Do not use any products that contain nicotine or tobacco. These products include cigarettes, chewing tobacco, and vaping devices, such as e-cigarettes. If you need help quitting, ask your health care provider. Eat a healthy diet and maintain a healthy weight. Poor diet and excess weight may make pain worse. Eat foods that are high in fiber. These include fresh fruits and vegetables, whole grains, and beans. Limit foods that are high in fat and processed sugars, such as fried and sweet foods. Exercise regularly. Exercise lowers stress and may help relieve pain. Ask your health care provider what activities and exercises are safe for you. If your health care provider approves, join an exercise class that combines movement and stress reduction. Examples include yoga and tai chi. Get enough  sleep. Lack of sleep may make pain worse. Lower stress  as much as possible. Practice stress reduction techniques as told by your therapist. General instructions Work with all your pain management providers to find the treatments that work best for you. You are an important member of your pain management team. There are many things you can do to reduce pain on your own. Consider joining an online or in-person support group for people who have chronic pain. Keep all follow-up visits. This is important. Where to find more information You can find more information about managing pain without opioids from: American Academy of Pain Medicine: painmed.[place] for Chronic Pain: instituteforchronicpain.org American Chronic Pain Association: theacpa.org Contact a health care provider if: You have side effects from pain medicine. Your pain gets worse or does not get better with treatments or home therapy. You are struggling with anxiety or depression. Summary Many types of pain can be managed without opioids. Chronic pain may respond better to pain management without opioids. Pain is best managed when you and a team of health care providers work together. Pain management without opioids may include non-opioid medicines, medical treatments, physical therapy, mental health therapy, and lifestyle changes. Tell your health care providers if your pain gets worse or is not being managed well enough. This information is not intended to replace advice given to you by your health care provider. Make sure you discuss any questions you have with your health care provider. Document Revised: 10/24/2020 Document Reviewed: 10/24/2020 Elsevier Patient Education  [place].

## 2022-08-20 DIAGNOSIS — M810 Age-related osteoporosis without current pathological fracture: Secondary | ICD-10-CM | POA: Diagnosis not present

## 2022-08-20 DIAGNOSIS — M546 Pain in thoracic spine: Secondary | ICD-10-CM | POA: Diagnosis not present

## 2022-08-20 DIAGNOSIS — M25512 Pain in left shoulder: Secondary | ICD-10-CM | POA: Diagnosis not present

## 2022-09-01 ENCOUNTER — Other Ambulatory Visit: Payer: Self-pay | Admitting: Internal Medicine

## 2022-09-01 DIAGNOSIS — E7849 Other hyperlipidemia: Secondary | ICD-10-CM

## 2022-09-01 DIAGNOSIS — I700 Atherosclerosis of aorta: Secondary | ICD-10-CM

## 2022-09-04 DIAGNOSIS — Z6826 Body mass index (BMI) 26.0-26.9, adult: Secondary | ICD-10-CM | POA: Diagnosis not present

## 2022-09-04 DIAGNOSIS — Z01419 Encounter for gynecological examination (general) (routine) without abnormal findings: Secondary | ICD-10-CM | POA: Diagnosis not present

## 2022-09-23 NOTE — Telephone Encounter (Signed)
Forwarding to Rx Prior Auth Team 

## 2022-09-24 DIAGNOSIS — M545 Low back pain, unspecified: Secondary | ICD-10-CM | POA: Diagnosis not present

## 2022-09-24 DIAGNOSIS — M546 Pain in thoracic spine: Secondary | ICD-10-CM | POA: Diagnosis not present

## 2022-09-24 DIAGNOSIS — M810 Age-related osteoporosis without current pathological fracture: Secondary | ICD-10-CM | POA: Diagnosis not present

## 2022-09-26 NOTE — Telephone Encounter (Signed)
Prolia VOB initiated via [email or web address] 

## 2022-09-29 DIAGNOSIS — M545 Low back pain, unspecified: Secondary | ICD-10-CM | POA: Diagnosis not present

## 2022-09-29 DIAGNOSIS — M546 Pain in thoracic spine: Secondary | ICD-10-CM | POA: Diagnosis not present

## 2022-09-30 ENCOUNTER — Encounter: Payer: Self-pay | Admitting: Internal Medicine

## 2022-09-30 NOTE — Progress Notes (Unsigned)
Subjective:    Patient ID: [name] [name], female    DOB: November 21, 1940, 82 y.o.   MRN: [id number]     HPI [name] [name] is here for follow up of her chronic medical problems, including htn, hypothyroid, hld, GERD, OP, prediabetes, aortic atherosclerosis  Saw Dr Layne Benton - has been on tymlos samples.  She feels she can continue with prolia here since she was on steroids for 1 year - likely not failure of prolia more likely compression fx were related to steroid use.     Medications and allergies reviewed with patient and updated if appropriate.  Current Outpatient Medications on File Prior to Visit  Medication Sig Dispense Refill   acetaminophen (TYLENOL) 650 MG CR tablet Take 650 mg by mouth every 8 (eight) hours as needed for pain.     Biotin 5 MG CAPS Taking daily  0   bisoprolol-hydrochlorothiazide (ZIAC) 5-6.25 MG tablet TAKE 1 TABLET BY MOUTH EVERY DAY 90 tablet 1   calcitonin, salmon, (MIACALCIN) 200 UNIT/ACT nasal spray Place 1 spray into alternate nostrils daily. (Patient not taking: Reported on 08/18/2022) 3.7 mL 12   CALCIUM CITRATE-VITAMIN D PO Take 600 mg by mouth in the morning and at bedtime.     denosumab (PROLIA) 60 MG/ML SOLN injection Inject 60 mg into the skin every 6 (six) months. Administer in upper arm, thigh, or abdomen (Patient not taking: Reported on 08/18/2022)     famotidine (PEPCID) 20 MG tablet Take 20 mg by mouth daily.     levothyroxine (SYNTHROID) 50 MCG tablet TAKE 1 TABLET BY MOUTH EVERY DAY 90 tablet 3   Multiple Vitamins-Minerals (CENTRUM SILVER PO) Take by mouth daily.     omeprazole (PRILOSEC) 20 MG capsule TAKE 1 CAPSULE BY MOUTH EVERY DAY (Patient not taking: Reported on 08/18/2022) 90 capsule 3   polyethylene glycol (MIRALAX / GLYCOLAX) 17 g packet Take 17 g by mouth daily. 2 capfuls daily 14 each 0   pravastatin (PRAVACHOL) 40 MG tablet TAKE 1 TABLET BY MOUTH EVERY DAY 90 tablet 0   PROLIA 60 MG/ML SOSY injection INJECT 60 MG INTO THE SKIN ONCE FOR 1 DOSE  (Patient not taking: Reported on 08/18/2022) 60 mL 1   traMADol (ULTRAM) 50 MG tablet Take 1 tablet (50 mg total) by mouth every 12 (twelve) hours as needed for severe pain. 10 tablet 0   TYMLOS 3120 MCG/1.56ML SOPN SMARTSIG:1 Pre-Filled Pen Syringe SUB-Q Every Evening     Wheat Dextrin (BENEFIBER) POWD Twice daily  0   No current facility-administered medications on file prior to visit.     Review of Systems     Objective:  There were no vitals filed for this visit. BP Readings from Last 3 Encounters:  06/11/22 (!) 163/65  06/03/22 138/78  05/26/22 (!) 145/75   Wt Readings from Last 3 Encounters:  08/18/22 131 lb (59.4 kg)  06/03/22 131 lb (59.4 kg)  05/26/22 132 lb 8 oz (60.1 kg)   There is no height or weight on file to calculate BMI.    Physical Exam     Lab Results  Component Value Date   WBC 10.1 05/14/2022   HGB 14.4 05/14/2022   HCT 42.0 05/14/2022   PLT 261 05/14/2022   GLUCOSE 111 (H) 05/14/2022   CHOL 217 (H) 04/02/2022   TRIG 153.0 (H) 04/02/2022   HDL 56.50 04/02/2022   LDLDIRECT 158.0 06/27/2015   LDLCALC 130 (H) 04/02/2022   ALT 17 05/14/2022   AST  24 05/14/2022   NA 136 05/14/2022   K 3.7 05/14/2022   CL 101 05/14/2022   CREATININE 0.79 05/14/2022   BUN 21 05/14/2022   CO2 25 05/14/2022   TSH 3.21 04/02/2022   HGBA1C 5.9 04/02/2022     Assessment & Plan:    See Problem List for Assessment and Plan of chronic medical problems.

## 2022-09-30 NOTE — Patient Instructions (Addendum)
     Your BP should be ideally less than 140/90 - monitor your BP.    Blood work was ordered.   The lab is on the first floor.    Medications changes include :   none      Return in about 6 months (around 04/03/2023) for Physical Exam.

## 2022-10-01 ENCOUNTER — Telehealth: Payer: Self-pay

## 2022-10-01 ENCOUNTER — Ambulatory Visit (INDEPENDENT_AMBULATORY_CARE_PROVIDER_SITE_OTHER): Payer: PPO | Admitting: Internal Medicine

## 2022-10-01 VITALS — BP 148/80 | HR 65 | Temp 98.1°F | Ht 60.0 in | Wt 124.0 lb

## 2022-10-01 DIAGNOSIS — M8000XD Age-related osteoporosis with current pathological fracture, unspecified site, subsequent encounter for fracture with routine healing: Secondary | ICD-10-CM

## 2022-10-01 DIAGNOSIS — E063 Autoimmune thyroiditis: Secondary | ICD-10-CM

## 2022-10-01 DIAGNOSIS — R7303 Prediabetes: Secondary | ICD-10-CM | POA: Diagnosis not present

## 2022-10-01 DIAGNOSIS — I10 Essential (primary) hypertension: Secondary | ICD-10-CM | POA: Diagnosis not present

## 2022-10-01 DIAGNOSIS — E038 Other specified hypothyroidism: Secondary | ICD-10-CM | POA: Diagnosis not present

## 2022-10-01 DIAGNOSIS — E7849 Other hyperlipidemia: Secondary | ICD-10-CM

## 2022-10-01 DIAGNOSIS — K219 Gastro-esophageal reflux disease without esophagitis: Secondary | ICD-10-CM

## 2022-10-01 DIAGNOSIS — I700 Atherosclerosis of aorta: Secondary | ICD-10-CM

## 2022-10-01 DIAGNOSIS — S22070A Wedge compression fracture of T9-T10 vertebra, initial encounter for closed fracture: Secondary | ICD-10-CM

## 2022-10-01 LAB — LIPID PANEL
Cholesterol: 164 mg/dL (ref 0–200)
HDL: 47.8 mg/dL (ref >39.00)
LDL Cholesterol: 84 mg/dL (ref 0–99)
NonHDL: 116.28
Total CHOL/HDL Ratio: 3
Triglycerides: 162 mg/dL — ABNORMAL HIGH (ref 0.0–149.0)
VLDL: 32.4 mg/dL (ref 0.0–40.0)

## 2022-10-01 LAB — COMPREHENSIVE METABOLIC PANEL
ALT: 15 U/L (ref 0–35)
AST: 21 U/L (ref 0–37)
Albumin: 4.1 g/dL (ref 3.5–5.2)
Alkaline Phosphatase: 36 U/L — ABNORMAL LOW (ref 39–117)
BUN: 16 mg/dL (ref 6–23)
CO2: 33 mEq/L — ABNORMAL HIGH (ref 19–32)
Calcium: 10.3 mg/dL (ref 8.4–10.5)
Chloride: 101 mEq/L (ref 96–112)
Creatinine, Ser: 0.66 mg/dL (ref 0.40–1.20)
GFR: 81.07 mL/min (ref >60.00)
Glucose, Bld: 94 mg/dL (ref 70–99)
Potassium: 4.4 mEq/L (ref 3.5–5.1)
Sodium: 142 mEq/L (ref 135–145)
Total Bilirubin: 0.9 mg/dL (ref 0.2–1.2)
Total Protein: 7.2 g/dL (ref 6.0–8.3)

## 2022-10-01 LAB — HEMOGLOBIN A1C: Hgb A1c MFr Bld: 5.7 % (ref 4.6–6.5)

## 2022-10-01 LAB — TSH: TSH: 0.09 u[IU]/mL — ABNORMAL LOW (ref 0.35–5.50)

## 2022-10-01 LAB — VITAMIN D 25 HYDROXY (VIT D DEFICIENCY, FRACTURES): VITD: 40.18 ng/mL (ref 30.00–100.00)

## 2022-10-01 MED ORDER — FAMOTIDINE 20 MG PO TABS: 20.0000 mg | ORAL_TABLET | Freq: Two times a day (BID) | ORAL | 3 refills | Status: AC | PRN

## 2022-10-01 NOTE — Assessment & Plan Note (Signed)
Chronic  Clinically euthyroid Check tsh and will titrate med dose if needed Currently taking levothyroxine 50 mcg daily

## 2022-10-01 NOTE — Assessment & Plan Note (Signed)
Chronic Started Prolia 07/2015 Did have compression fractures while on Prolia-saw Dr. Layne Benton and she feels it was likely related to being on steroids for PMR for a year-not failure with Prolia Currently doing Tymlos for few months, but will continue Prolia Next Prolia dose is next month-April 2024-will try to get that scheduled Continue calcium and vitamin D Check vitamin D level

## 2022-10-01 NOTE — Assessment & Plan Note (Signed)
Chronic Regular exercise and healthy diet encouraged Check lipid panel  Continue pravastatin 40 mg daily

## 2022-10-01 NOTE — Assessment & Plan Note (Signed)
Chronic Following with orthopedics Currently taking tramadol as needed for severe pain-will continue tramadol 50 mg every 12 hours as needed Take Tylenol as needed for less severe pain On Prolia for osteoporosis and Tymlos

## 2022-10-01 NOTE — Assessment & Plan Note (Addendum)
Chronic GERD controlled Continue famotidine 20 mg daily-takes omeprazole occ prn - advised to take an extra pepcid if needed Will send in script  for pepcid 20 mg bid prn

## 2022-10-01 NOTE — Assessment & Plan Note (Signed)
Chronic Continue pravastatin 40 mg daily

## 2022-10-01 NOTE — Assessment & Plan Note (Signed)
Chronic Check a1c Low sugar / carb diet Stressed regular exercise  

## 2022-10-01 NOTE — Assessment & Plan Note (Addendum)
Chronic Blood pressure not ideally controlled here today Advised that she monitor her BP daily for the next 2 weeks-discussed goal of consistently less than 140/90 given age CMP Continue bisoprolol-HCTZ 5-6.25 mg daily

## 2022-10-03 ENCOUNTER — Other Ambulatory Visit (HOSPITAL_COMMUNITY): Payer: Self-pay

## 2022-10-03 DIAGNOSIS — M545 Low back pain, unspecified: Secondary | ICD-10-CM | POA: Diagnosis not present

## 2022-10-03 NOTE — Telephone Encounter (Signed)
Pt ready for scheduling for Prolia on or after : 11/06/2022  Out-of-pocket cost due at time of visit: $302  Primary: [employer] Prolia co-insurance: 20% Admin fee co-insurance: 0  Secondary: N/A Prolia co-insurance:  Admin fee co-insurance:   Medical Benefit Details: Date Benefits were checked: 10/03/2022 Deductible: $0/ Coinsurance: 20%/ Admin Fee: 0  Prior Auth: not required PA# Expiration Date:   Max out of pocket: $3200 ($40 accumulated)   Pharmacy benefit: Copay $200.00 If patient wants fill through the pharmacy benefit please send prescription to:  [facility] , and include estimated need by date in rx notes. Pharmacy will ship medication directly to the office.  Patient NOT eligible for Prolia Copay Card. Copay Card can make patient's cost as little as $25. Link to apply: [email or web address]://[email or web address]  ** This summary of benefits is an estimation of the patient's out-of-pocket cost. Exact cost may very based on individual plan coverage.

## 2022-10-04 ENCOUNTER — Other Ambulatory Visit: Payer: Self-pay | Admitting: Internal Medicine

## 2022-10-04 DIAGNOSIS — E039 Hypothyroidism, unspecified: Secondary | ICD-10-CM

## 2022-10-04 MED ORDER — LEVOTHYROXINE SODIUM 50 MCG PO TABS: 50.0000 ug | ORAL_TABLET | Freq: Every day | ORAL | 3 refills | Status: DC

## 2022-10-07 ENCOUNTER — Other Ambulatory Visit: Payer: Self-pay | Admitting: Internal Medicine

## 2022-10-07 DIAGNOSIS — I700 Atherosclerosis of aorta: Secondary | ICD-10-CM

## 2022-10-07 DIAGNOSIS — E7849 Other hyperlipidemia: Secondary | ICD-10-CM

## 2022-10-07 NOTE — Telephone Encounter (Signed)
Spoke with patient today.  Benefits went up for Prolia Copay and she was going to check with insurance to see what her copy was and call us back to schedule at a later date.

## 2022-10-07 NOTE — Telephone Encounter (Signed)
Called [name] [name] gave her coverage review. [name] [name] states Dr. Layne Benton has order a test at [facility]. She want to complete first, and then call back to schedule her prolia.Marland KitchenJohny Chess

## 2022-10-09 ENCOUNTER — Other Ambulatory Visit: Payer: Self-pay | Admitting: Physician Assistant

## 2022-10-09 DIAGNOSIS — M8000XA Age-related osteoporosis with current pathological fracture, unspecified site, initial encounter for fracture: Secondary | ICD-10-CM

## 2022-10-15 ENCOUNTER — Ambulatory Visit
Admission: RE | Admit: 2022-10-15 | Discharge: 2022-10-15 | Disposition: A | Discharge status: Home / Self Care | Payer: PPO | Source: Ambulatory Visit | Attending: Physician Assistant | Admitting: Physician Assistant

## 2022-10-15 DIAGNOSIS — M8008XA Age-related osteoporosis with current pathological fracture, vertebra(e), initial encounter for fracture: Secondary | ICD-10-CM | POA: Diagnosis not present

## 2022-10-15 DIAGNOSIS — M8000XA Age-related osteoporosis with current pathological fracture, unspecified site, initial encounter for fracture: Secondary | ICD-10-CM

## 2022-10-15 HISTORY — PX: IR RADIOLOGIST EVAL & MGMT: IMG5224

## 2022-10-15 NOTE — H&P (Signed)
Interventional Radiology - Clinic Visit, Initial H&P    Referring Provider: Chriss Czar, PA-C  Reason for Visit: T10 compression fracture    History of Present Illness  [name] [name] is a 82 y.o. female with a relevant past medical history of osteoporosis seen today in Interventional Radiology clinic for T10 compression fracture.  The patient was seen today for continued lower back pain.  Patient reports that pain began suddenly one morning in October 2023.  She reports no history of trauma or direct injury to the area.  She was evaluated in the emergency room, and CT abdomen pelvis performed at that time demonstrated indeterminate compression fractures at T10 and T12, with other chronic compression deformities noted.  Since that time, she has taken both oral steroids and had steroid injections done with little pain relief.  MRI of the thoracic and lumbar spine performed September 29, 2022 demonstrated subacute compression fracture of the T10 vertebral body with 50% height loss.   She reports that the pain can be severe 9-10/10 at times.  She takes tramadol as needed, mostly at night to help her sleep.  She reports that there is not any significant pain relief with the tramadol, although it does help her fall asleep.  She does not have any side effects from tramadol, and is taking MiraLax.  She rates moderate disability on the [employer] disability questionnaire with 8/24 positive, complaining mostly of limited mobility, difficulty sleeping, and unable to perform certain tasks around the house.   Past medical history is significant for osteoporosis, with most recent DEXA scan on September 2023.  She has been taking medications for osteoporosis as documented in the EMR.    Additional Past Medical History Past Medical History:  Diagnosis Date   Benign neoplasm of colon    Cataract    bilateral,had surgery   DDD (degenerative disc disease), lumbar    MRI w. [facility] 05/2015, s/p ESI x 1 w/  improvement   Diaphragmatic hernia without mention of obstruction or gangrene    Diverticulosis of colon (without mention of hemorrhage)    DJD (degenerative joint disease) of knee    L>R, IA cortisone by [facility] in L 03/2015   Esophageal reflux    Gallstones    History of chicken pox    Hyperlipidemia    Hypertension    Irritable bowel syndrome    Migraines    Obesity, unspecified    Osteoporosis, unspecified    Tachycardia    Unspecified hemorrhoids with other complication      Surgical History  Past Surgical History:  Procedure Laterality Date   [place]     Medications  I have reviewed the current medication list. Refer to chart for details. Current Outpatient Medications  Medication Instructions   acetaminophen (TYLENOL) 650 mg, Oral, Every 8 hours PRN   Biotin 5 MG CAPS Taking daily   bisoprolol-hydrochlorothiazide (ZIAC) 5-6.25 MG tablet TAKE 1 TABLET BY MOUTH EVERY DAY   CALCIUM CITRATE-VITAMIN D PO 600 mg, Oral, 2 times daily   denosumab (PROLIA) 60 mg, Subcutaneous, Every 6 months, Administer in upper arm, thigh, or abdomen    famotidine (PEPCID) 20 mg, Oral, 2 times daily PRN   levothyroxine (SYNTHROID) 50 mcg, Oral, Daily, Take 6 days a week only   Multiple Vitamins-Minerals (CENTRUM SILVER PO) Oral,  Daily,     polyethylene glycol (MIRALAX / GLYCOLAX) 17 g, Oral, Daily, 2 capfuls daily    pravastatin (PRAVACHOL) 40 MG tablet TAKE 1 TABLET BY MOUTH EVERY DAY   PROLIA 60 MG/ML SOSY injection INJECT 60 MG INTO THE SKIN ONCE FOR 1 DOSE   traMADol (ULTRAM) 50 mg, Oral, Every 12 hours PRN   TYMLOS 3120 MCG/1.56ML SOPN SMARTSIG:1 Pre-Filled Pen Syringe SUB-Q Every Evening   Wheat Dextrin (BENEFIBER) POWD Twice daily      Allergies No Known Allergies Does patient have contrast allergy: No     Physical Exam Current Vitals Temp: 98 F  (36.7 C) (Temp Source: Oral)  Pulse Rate: 67  Resp: 14  BP: (!) 182/84  SpO2: 96 %        There is no height or weight on file to calculate BMI.  General: Alert and answers questions appropriately.  HEENT: Normocephalic, atraumatic.  Cardiac: Regular rate.  Pulmonary: Normal work of breathing. On room air. Abdominal: Soft without distension. Back Tenderness over mid to lower back.    Pertinent Lab Results    Latest Ref Rng & Units 05/14/2022    2:34 AM 05/12/2022   10:44 AM 04/02/2022   10:38 AM  CBC  WBC 4.0 - 10.5 K/uL 10.1  8.6  7.9   Hemoglobin 12.0 - 15.0 g/dL 14.4  14.7  13.4   Hematocrit 36.0 - 46.0 % 42.0  44.0  40.1   Platelets 150 - 400 K/uL 261  264  212.0       Latest Ref Rng & Units 10/01/2022   10:23 AM 05/14/2022    2:34 AM 05/12/2022   10:44 AM  CMP  Glucose 70 - 99 mg/dL 94  111  111   BUN 6 - 23 mg/dL 16  21  10    Creatinine 0.40 - 1.20 mg/dL 0.66  0.79  0.72   Sodium 135 - 145 mEq/L 142  136  138   Potassium 3.5 - 5.1 mEq/L 4.4  3.7  3.8   Chloride 96 - 112 mEq/L 101  101  102   CO2 19 - 32 mEq/L 33  25  28   Calcium 8.4 - 10.5 mg/dL 10.3  9.7  9.4   Total Protein 6.0 - 8.3 g/dL 7.2  7.2  7.7   Total Bilirubin 0.2 - 1.2 mg/dL 0.9  1.1  1.3   Alkaline Phos 39 - 117 U/L 36  50  50   AST 0 - 37 U/L 21  24  20    ALT 0 - 35 U/L 15  17  15        Relevant and/or Recent Imaging: MRI T/L Spine 09/29/2022  IMPRESSION: 1. Acute on chronic inferior endplate compression fracture of the T10 vertebral body with progressive height loss since October of 2023, now approximately 50% height loss. Mild retropulsion at the inferior endplate with bilateral facet arthropathy resulting in mild canal stenosis with moderate-severe bilateral foraminal stenosis. 2. Subchondral marrow edema associated with the left T10-11 facet joint may be posttraumatic or degenerative. 3. Multiple chronic compression fractures involving the T4, T11, T12, and L1  vertebrae. Electronically Signed By: Davina Poke D.O. On: 09/30/2022 09:25      Assessment & Plan:   Patient has suffered subacute osteoporotic fracture of the T10 vertebra.   History and exam have demonstrated the following:  Acute/Subacute fracture by imaging dated 09/29/2022, Pain on exam concordant with level of fracture, Failure of conservative therapy and pain refractory to  narcotic pain mediation, and Significant disability on the [employer] with 8/24 positive symptoms, reflecting significant impact/impairment of (ADLs)   ICD-10-CM Codes that Support Medical Necessity ([email or web address])  M80.08XA    Age-related osteoporosis with current pathological fracture, vertebra(e), initial encounter for fracture   Plan:  T10 vertebral body augmentation with balloon kyphoplasty  Post-procedure disposition: outpatient DRI-A  Medication holds: none   The patient has suffered a fracture of the T10 vertebral body. It is recommended that patients aged 67 years or older be evaluated for possible testing or treatment of osteoporosis. A copy of this consult report is sent to the patient's referring physician.  Advanced Care Plan: The patient did not want to provide an [place] at the time of this visit     Total time spent on today's visit was over 40 Minutes, including both face-to-face time and non face-to-face time, personally spent on review of chart (including labs and relevant imaging), discussing further workup and treatment options, referral to specialist if needed, reviewing outside records if pertinent, answering patient questions, and coordinating care regarding T10 fracture as well as management strategy.      Albin Felling, MD  Vascular and Interventional Radiology 10/15/2022 1:55 PM

## 2022-10-18 ENCOUNTER — Other Ambulatory Visit: Payer: Self-pay | Admitting: Internal Medicine

## 2022-10-27 NOTE — Discharge Instructions (Signed)
Kyphoplasty Post Procedure Discharge Instructions  May resume a regular diet and any medications that you routinely take (including pain medications). However, if you are taking Aspirin or an anticoagulant/blood thinner you will be told when you can resume taking these by the healthcare provider. No driving day of procedure. The day of your procedure take it easy. You may use an ice pack as needed to injection sites on back.  Ice to back 30 minutes on and 30 minutes off, as needed. May remove bandaids tomorrow after taking a shower. Replace daily with a clean bandaid until healed.  Do not lift anything heavier than a milk jug for 1-2 weeks or determined by your physician.  Follow up with your physician in 2 weeks.    Please contact our office at [phone] for the following symptoms or if you have any questions:  Fever greater than 100 degrees Increased swelling, pain, or redness at injection site. Increased back and/or leg pain New numbness or change in symptoms from before the procedure.    Thank you for visiting [facility]. 

## 2022-10-28 ENCOUNTER — Ambulatory Visit
Admission: RE | Admit: 2022-10-28 | Discharge: 2022-10-28 | Disposition: A | Discharge status: Home / Self Care | Payer: PPO | Source: Ambulatory Visit | Attending: Physician Assistant | Admitting: Physician Assistant

## 2022-10-28 DIAGNOSIS — M8008XA Age-related osteoporosis with current pathological fracture, vertebra(e), initial encounter for fracture: Secondary | ICD-10-CM | POA: Diagnosis not present

## 2022-10-28 DIAGNOSIS — M8000XA Age-related osteoporosis with current pathological fracture, unspecified site, initial encounter for fracture: Secondary | ICD-10-CM

## 2022-10-28 HISTORY — PX: IR KYPHO THORACIC WITH BONE BIOPSY: IMG5518

## 2022-10-28 MED ORDER — MIDAZOLAM HCL 2 MG/2ML IJ SOLN
1.0000 mg | INTRAMUSCULAR | Status: DC | PRN
  Administered 2022-10-28 (×3): 0.5 mg via INTRAVENOUS

## 2022-10-28 MED ORDER — KETOROLAC TROMETHAMINE 30 MG/ML IJ SOLN
30.0000 mg | Freq: Once | INTRAMUSCULAR | Status: AC
  Administered 2022-10-28: 30 mg via INTRAVENOUS

## 2022-10-28 MED ORDER — SODIUM CHLORIDE 0.9 % IV SOLN: INTRAVENOUS | Status: DC

## 2022-10-28 MED ORDER — FENTANYL CITRATE PF 50 MCG/ML IJ SOSY
25.0000 ug | PREFILLED_SYRINGE | INTRAMUSCULAR | Status: DC | PRN
  Administered 2022-10-28 (×2): 25 ug via INTRAVENOUS

## 2022-10-28 MED ORDER — CEFAZOLIN SODIUM-DEXTROSE 2-4 GM/100ML-% IV SOLN
2.0000 g | INTRAVENOUS | Status: AC
  Administered 2022-10-28: 2 g via INTRAVENOUS

## 2022-10-28 NOTE — Progress Notes (Signed)
Pt back in nursing recovery area. Pt still drowsy from procedure but will wake up when spoken to. Pt follows commands, talks in complete sentences and has no complaints at this time. Pt will remain in nursing station until discharge.  ?

## 2022-11-04 ENCOUNTER — Other Ambulatory Visit: Payer: Self-pay | Admitting: Interventional Radiology

## 2022-11-04 ENCOUNTER — Telehealth: Payer: Self-pay

## 2022-11-04 DIAGNOSIS — Z09 Encounter for follow-up examination after completed treatment for conditions other than malignant neoplasm: Secondary | ICD-10-CM

## 2022-11-04 NOTE — Telephone Encounter (Signed)
Phone call to pt to follow up from her kyphoplasty on 10/28/22. Pt reports her pain was better the first day after the procedure then returned to how it was before the procedure and she is hurting. Pt denies any signs of infection, redness at the site, draining or fever. Pt has no complaints at this time and will be scheduled for a telephone follow up with Dr. Archer Asa next week. Pt advised to call back if anything were to change or any concerns arise and we will arrange an in person appointment. Pt verbalized understanding.

## 2022-11-11 ENCOUNTER — Other Ambulatory Visit: Payer: Self-pay | Admitting: Interventional Radiology

## 2022-11-11 ENCOUNTER — Ambulatory Visit
Admission: RE | Admit: 2022-11-11 | Discharge: 2022-11-11 | Disposition: A | Discharge status: Home / Self Care | Payer: PPO | Source: Ambulatory Visit | Attending: Interventional Radiology | Admitting: Interventional Radiology

## 2022-11-11 DIAGNOSIS — S22000A Wedge compression fracture of unspecified thoracic vertebra, initial encounter for closed fracture: Secondary | ICD-10-CM

## 2022-11-11 DIAGNOSIS — Z09 Encounter for follow-up examination after completed treatment for conditions other than malignant neoplasm: Secondary | ICD-10-CM

## 2022-11-11 HISTORY — PX: IR RADIOLOGIST EVAL & MGMT: IMG5224

## 2022-11-11 NOTE — Progress Notes (Signed)
IR follow up note   Patient was seen today for a 2 week follow-up following T10 kyphoplasty on October 28, 2022.  Patient reports small improvement in her pain following the kyphoplasty procedure.  She reports that she is mostly able to sleep better at night and no longer takes her tramadol pain medication.  She reports however that she continues to sit down most of the day and has trouble getting up and moves more slowly.  She does not feel this has improved much following the kyphoplasty.  This is also confirmed by her husband was present at the time of the visit.    Given small improvements already, I am hopeful that the patient may just have slow healing following her kyphoplasty, and I recommend that we follow-up in another 2 weeks to see how she does 1 month following the procedure.  If she continues to have persistent pain at the time, will likely plan for updated imaging.  I also recommend that she keep her appointment with her orthopedic provider which she has in early May, and she may benefit from physical therapy to help with any deconditioning that has happened following her spine fracture and recovery process.    Plan:  Follow up with me in 2 weeks  May benefit from PT to help with deconditioning - coordinate with PCP and orthopedics    Olive Bass, MD  Vascular and Interventional Radiology 11/11/2022 4:27 PM

## 2022-11-25 ENCOUNTER — Ambulatory Visit
Admission: RE | Admit: 2022-11-25 | Discharge: 2022-11-25 | Disposition: A | Discharge status: Home / Self Care | Payer: PPO | Source: Ambulatory Visit | Attending: Interventional Radiology | Admitting: Interventional Radiology

## 2022-11-25 ENCOUNTER — Other Ambulatory Visit: Payer: Self-pay | Admitting: Interventional Radiology

## 2022-11-25 VITALS — BP 156/72 | HR 66 | Temp 98.2°F | Resp 16

## 2022-11-25 DIAGNOSIS — M545 Low back pain, unspecified: Secondary | ICD-10-CM | POA: Diagnosis not present

## 2022-11-25 DIAGNOSIS — Z09 Encounter for follow-up examination after completed treatment for conditions other than malignant neoplasm: Secondary | ICD-10-CM

## 2022-11-25 DIAGNOSIS — Z9889 Other specified postprocedural states: Secondary | ICD-10-CM

## 2022-11-25 DIAGNOSIS — M8008XA Age-related osteoporosis with current pathological fracture, vertebra(e), initial encounter for fracture: Secondary | ICD-10-CM | POA: Diagnosis not present

## 2022-11-25 DIAGNOSIS — S22000A Wedge compression fracture of unspecified thoracic vertebra, initial encounter for closed fracture: Secondary | ICD-10-CM

## 2022-11-25 HISTORY — PX: IR RADIOLOGIST EVAL & MGMT: IMG5224

## 2022-11-25 NOTE — Progress Notes (Signed)
Chief Complaint: Intractable back pain due to Osteoporotic compression fracture  Referring Physician(s): Chadwell, Ivin Booty, PA-C   History of Present Illness: [name] [name] is a 82 y.o. female with a relevant past medical history of osteoporosis seen initially in Interventional Radiology clinic for T10 compression fracture on 3/202024.   Her pain began suddenly one morning in October 2023.  She reports no history of trauma or direct injury to the area.  She was evaluated in the emergency room, and CT abdomen pelvis performed at that time demonstrated indeterminate compression fractures at T10 and T12, with other chronic compression deformities noted.  She had taken both oral steroids and had steroid injections done with little pain relief.  MRI of the thoracic and lumbar spine performed September 29, 2022 demonstrated subacute compression fracture of the T10 vertebral body with 50% height loss.  She underwent T10 Kyphoplasty on 10/28/2022.  Unfortunately her pain has not significantly improved since the procedure.  She has been rarely using Tramadol for pain control.  Her pain is worst when changing positions.  The pain is mostly in her back but sometimes wraps around to the front. She does not report any new symptoms.  Thoracolumbar radiographs were repeated today and show worsening vertebral body height at T11.  T10 Kyphoplasty changes appear appropriate.  Past Medical History:  Diagnosis Date   Benign neoplasm of colon    Cataract    bilateral,had surgery   DDD (degenerative disc disease), lumbar    MRI w. [facility] 05/2015, s/p ESI x 1 w/ improvement   Diaphragmatic hernia without mention of obstruction or gangrene    Diverticulosis of colon (without mention of hemorrhage)    DJD (degenerative joint disease) of knee    L>R, IA cortisone by [facility] in L 03/2015   Esophageal reflux    Gallstones    History of chicken pox    Hyperlipidemia    Hypertension    Irritable bowel syndrome     Migraines    Obesity, unspecified    Osteoporosis, unspecified    Tachycardia    Unspecified hemorrhoids with other complication     Past Surgical History:  Procedure Laterality Date   APPENDECTOMY  1979   CATARACT EXTRACTION     CHOLECYSTECTOMY  1989   COLONOSCOPY     IR KYPHO THORACIC WITH BONE BIOPSY  10/28/2022   IR RADIOLOGIST EVAL & MGMT  10/15/2022   IR RADIOLOGIST EVAL & MGMT  11/11/2022   POLYPECTOMY     VAGINAL HYSTERECTOMY  1996    Allergies: Patient has no known allergies.  Medications: Prior to Admission medications   Medication Sig Start Date End Date Taking? Authorizing Provider  acetaminophen (TYLENOL) 650 MG CR tablet Take 650 mg by mouth every 8 (eight) hours as needed for pain.    [provider]  Biotin 5 MG CAPS Taking daily 11/01/15   Pincus Sanes, MD  bisoprolol-hydrochlorothiazide [facility]) 5-6.25 MG tablet TAKE 1 TABLET BY MOUTH EVERY DAY 10/20/22   Pincus Sanes, MD  CALCIUM CITRATE-VITAMIN D PO Take 600 mg by mouth in the morning and at bedtime.    [provider]  denosumab (PROLIA) 60 MG/ML SOLN injection Inject 60 mg into the skin every 6 (six) months. Administer in upper arm, thigh, or abdomen    [provider]  famotidine (PEPCID) 20 MG tablet Take 1 tablet (20 mg total) by mouth 2 (two) times daily as needed for heartburn or indigestion. 10/01/22  Pincus Sanes, MD  levothyroxine (SYNTHROID) 50 MCG tablet Take 1 tablet (50 mcg total) by mouth daily. Take 6 days a week only 10/04/22   Pincus Sanes, MD  Multiple Vitamins-Minerals (CENTRUM SILVER PO) Take by mouth daily.    [provider]  polyethylene glycol (MIRALAX / GLYCOLAX) 17 g packet Take 17 g by mouth daily. 2 capfuls daily 05/10/22   Ellsworth Lennox, PA-C  pravastatin (PRAVACHOL) 40 MG tablet TAKE 1 TABLET BY MOUTH EVERY DAY 10/07/22   Burns, Bobette Mo, MD  PROLIA 60 MG/ML SOSY injection INJECT 60 MG INTO THE SKIN ONCE FOR 1 DOSE 10/22/21   Pincus Sanes, MD   traMADol (ULTRAM) 50 MG tablet Take 1 tablet (50 mg total) by mouth every 12 (twelve) hours as needed for severe pain. 06/16/22   Pincus Sanes, MD  TYMLOS 3120 MCG/1.56ML SOPN SMARTSIG:1 Pre-Filled Pen Syringe SUB-Q Every Evening 07/09/22   [provider]  Wheat Dextrin [facility]) POWD Twice daily 05/01/17   Pincus Sanes, MD     Family History  Problem Relation Age of Onset   Stroke Mother    Dementia Mother    Stroke Father    Heart disease Sister        x 2   Colon cancer Sister    Stroke Sister 45   Heart disease Brother     Social History   Socioeconomic History   Marital status: Married    Spouse name: Iantha Fallen   Number of children: 2   Years of education: Not on file   Highest education level: Not on file  Occupational History   Occupation: retired  Tobacco Use   Smoking status: Never   Smokeless tobacco: Never  Vaping Use   Vaping Use: Never used  Substance and Sexual Activity   Alcohol use: No    Alcohol/week: 0.0 standard drinks of alcohol   Drug use: No   Sexual activity: Not Currently  Other Topics Concern   Not on file  Social History Narrative   Exercise: no regular exercise   One daughter in [place], one in [place]   Social Determinants of Health   Financial Resource Strain: Low Risk  (08/18/2022)   Overall Financial Resource Strain (CARDIA)    Difficulty of Paying Living Expenses: Not hard at all  Food Insecurity: No Food Insecurity (08/18/2022)   Hunger Vital Sign    Worried About Running Out of Food in the Last Year: Never true    Ran Out of Food in the Last Year: Never true  Transportation Needs: No Transportation Needs (08/18/2022)   PRAPARE - Administrator, Civil Service (Medical): No    Lack of Transportation (Non-Medical): No  Physical Activity: Insufficiently Active (08/18/2022)   Exercise Vital Sign    Days of Exercise per Week: 7 days    Minutes of Exercise per Session: 20 min  Stress: No Stress Concern  Present (08/18/2022)   [employer] of Occupational Health - Occupational Stress Questionnaire    Feeling of Stress : Not at all  Social Connections: Socially Integrated (08/18/2022)   Social Connection and Isolation Panel [NHANES]    Frequency of Communication with Friends and Family: More than three times a week    Frequency of Social Gatherings with Friends and Family: More than three times a week    Attends Religious Services: More than 4 times per year    Active Member of [employer] or Organizations: Yes  Attends Banker Meetings: More than 4 times per year    Marital Status: Married   Review of Systems: A 12 point ROS discussed and pertinent positives are indicated in the HPI above.  All other systems are negative.  Review of Systems  Vital Signs: BP (!) 156/72 (BP Location: Left Arm, Patient Position: Sitting, Cuff Size: Normal)   Pulse 66   Temp 98.2 F (36.8 C)   Resp 16   SpO2 98%   Advance Care Plan: The advanced care plan/surrogate decision maker was discussed at the time of visit and the patient did not wish to discuss or was not able to name a surrogate decision maker or provide an advance care plan.   Physical Exam Constitutional:      Appearance: Normal appearance.  Musculoskeletal:        General: Tenderness present.       Arms:     Comments: Focal tenderness to palpation.  Skin:    General: Skin is warm and dry.  Neurological:     Mental Status: She is alert.  Psychiatric:        Mood and Affect: Mood normal.        Behavior: Behavior normal.    Imaging: DG THORACOLUMBAR SPINE  Result Date: 11/25/2022 CLINICAL DATA:  82 year old woman experienced acute back pain in October of 2023. Her pain did not significantly improve despite epidural steroid injection on 06/11/2022. Subsequent MRI on 09/29/2022 showed acute on chronic T10 compression fracture which was treated with kyphoplasty on 10/28/2022. She continues to have mid to lower back pain.  EXAM: THORACOLUMBAR SPINE 2V COMPARISON:  MRI thoracolumbar spine 09/29/2022 and thoracic spine radiographs 05/27/2022 FINDINGS: Levoconvex rotary scoliosis of the thoracolumbar spine is again seen. There has been interval T10 kyphoplasty. There has been interval vertebral body height loss at T11 compared to both prior x-ray and MRI with a proximally 30% vertebral body height loss. Previously seen T12 compression fracture is unchanged in the degree of vertebral body height loss. Grade 1 retrolisthesis of L1 on L2 and L2 on L3. Grade 1 anterolisthesis of L5 on S1. Mild deformity of the inferior endplate of L1 is unchanged from prior exams. Lumbar vertebral body heights are otherwise maintained. IMPRESSION: 1. Interval T10 kyphoplasty. No significant change in vertebral body height loss at T10 compared to prior MRI from 09/29/2022. 2. Interval increase in vertebral body height loss at T11, proximally 30% greater vertebral body height loss compared to prior MRI and radiographs. Findings are consistent with acute on chronic T11 compression fracture. Electronically Signed   By: Acquanetta Belling M.D.   On: 11/25/2022 13:24   IR Radiologist Eval & Mgmt  Result Date: 11/11/2022 EXAM: NEW PATIENT OFFICE VISIT CHIEF COMPLAINT: Documented in the EMR HISTORY OF PRESENT ILLNESS: Patient was seen today for a 2 week follow-up following T10 kyphoplasty on October 28, 2022. Patient reports small improvement in her pain following the kyphoplasty procedure. She reports that she is mostly able to sleep better at night and no longer takes her tramadol pain medication. She reports however that she continues to sit down most of the day and has trouble getting up and moves more slowly. She does not feel this has improved much following the kyphoplasty. This is also confirmed by her husband was present at the time of the visit. Given small improvements already, I am hopeful that the patient may just have slow healing following her kyphoplasty,  and I recommend that we follow-up in another  2 weeks to see how she does 1 month following the procedure. If she continues to have persistent pain at the time, will likely plan for updated imaging. I also recommend that she keep her appointment with her orthopedic provider which she has in early May, and she may benefit from physical therapy to help with any deconditioning that has happened following her spine fracture and recovery process. REVIEW OF SYSTEMS: Documented in the EMR PHYSICAL EXAMINATION: Documented in the EMR ASSESSMENT AND PLAN: Documented in the EMR Electronically Signed   By: Olive Bass M.D.   On: 11/11/2022 16:27   IR KYPHO THORACIC WITH BONE BIOPSY  Result Date: 10/28/2022 INDICATION: T10 compression fracture EXAM: T10 vertebral body augmentation using balloon kyphoplasty COMPARISON:  None Available. MEDICATIONS: Documented in the EMR ANESTHESIA/SEDATION: Moderate (conscious) sedation was employed during this procedure. A total of Versed 1.5 mg and Fentanyl 50 mcg was administered intravenously by the radiology nurse. Total intra-service moderate Sedation Time: 33 minutes. The patient's level of consciousness and vital signs were monitored continuously by radiology nursing throughout the procedure under my direct supervision. FLUOROSCOPY: Radiation Exposure Index (as provided by the fluoroscopic device): 6.3 minutes (39 mGy) COMPLICATIONS: None immediate. PROCEDURE: Informed written consent was obtained from the patient after a thorough discussion of the procedural risks, benefits and alternatives. All questions were addressed. Maximal Sterile Barrier Technique was utilized including caps, mask, sterile gowns, sterile gloves, sterile drape, hand hygiene and skin antiseptic. A timeout was performed prior to the initiation of the procedure. The patient was positioned prone on the exam table. A unipedicular access was planned. Skin entry site was marked overlying the T10 vertebral body using  fluoroscopy. The overlying skin was then prepped and draped in the standard sterile fashion. Local analgesia was obtained with 1% lidocaine. Attention was turned to the right side. Under fluoroscopic guidance, a 10 gauge introducer needle was advanced towards the lateral margin of the pedicle. Using multiple projections, the introducer needle was advanced towards the posterior margin of the vertebral body via a transpedicular approach. The inner needle was then removed, and the drill was advanced towards the anterior margin of the vertebral body. A Kyphon 15 mm inflatable bone tamp was then advanced through the transpedicular access needle and positioned within the mid vertebral body. Kyphoplasty was then performed, ensuring that the balloon contours stayed within the vertebral body margins. The balloon was then deflated and removed, followed by advancement of the bone filler device and the instillation of acrylic bone cement with excellent filling in the AP and lateral projections. No extravasation was noted in the disk spaces or posteriorly into the spinal canal. No epidural venous contamination was seen. At the end of the procedure, the introducer cannula and bone filler device were removed without difficulty. A clean dressing was placed after hemostasis. The patient tolerated all aspects of the procedure well, and was transferred to recovery in stable condition. IMPRESSION: 1. Successful T10 vertebral body augmentation using balloon kyphoplasty. If the patient has known osteoporosis, recommend treatment as clinically indicated. If the patient's bone density status is unknown, DEXA scan is recommended. Electronically Signed   By: Olive Bass M.D.   On: 10/28/2022 11:09    Labs:  CBC: Recent Labs    04/02/22 1038 05/12/22 1044 05/14/22 0234  WBC 7.9 8.6 10.1  HGB 13.4 14.7 14.4  HCT 40.1 44.0 42.0  PLT 212.0 264 261    COAGS: No results for input(s): "INR", "APTT" in the last 8760  hours.  BMP: Recent Labs    04/02/22 1038 05/12/22 1044 05/14/22 0234 10/01/22 1023  NA 140 138 136 142  K 4.2 3.8 3.7 4.4  CL 101 102 101 101  CO2 33* 28 25 33*  GLUCOSE 93 111* 111* 94  BUN 16 10 21 16   CALCIUM 10.7* 9.4 9.7 10.3  CREATININE 0.70 0.72 0.79 0.66  GFRNONAA  --  >60 >60  --     LIVER FUNCTION TESTS: Recent Labs    04/02/22 1038 05/12/22 1044 05/14/22 0234 10/01/22 1023  BILITOT 1.0 1.3* 1.1 0.9  AST 19 20 24 21   ALT 15 15 17 15   ALKPHOS 39 50 50 36*  PROT 7.5 7.7 7.2 7.2  ALBUMIN 4.1 4.1 4.0 4.1    TUMOR MARKERS: No results for input(s): "AFPTM", "CEA", "CA199", "CHROMGRNA" in the last 8760 hours.  Assessment & Plan:   82 year old woman with history of Osteoporosis was recently treated with Kyphoplasty for T10 compression fracture.  Unfortunately she has had continued back pain.  X-rays repeated today show worsening vertebral body height at T11 consistent with acute on chronic osteoporotic fracture.  This is like the cause of her pain.  History and exam have demonstrated the following:  Acute/Subacute fracture by imaging dated 11/25/2022, Pain on exam concordant with level of fracture, and Failure of conservative therapy and pain refractory to narcotic pain mediation   ICD-10-CM Codes that Support Medical Necessity ([email or web address].at.[email or web address]?[email or web address])  M80.08XA    Age-related osteoporosis with current pathological fracture, vertebra(e), initial encounter for fracture and S22.080A    Wedge compression fracture of T11-T12 vertebra, initial encounter for closed fracture    Plan:  T11 vertebral body augmentation with balloon kyphoplasty  Post-procedure disposition: outpatient at [facility]   The patient has suffered a fracture of the T11 vertebral body. It is recommended that patients aged 66 years or older be evaluated for possible testing or treatment of osteoporosis. A copy of this consult report is  sent to the patient's referring physician.    Total time spent on today's visit was over   25 Minutes, including both face-to-face time and non face-to-face time, personally spent on review of chart (including labs and relevant imaging), discussing further workup and treatment options, referral to specialist if needed, reviewing outside records if pertinent, answering patient questions, and coordinating care regarding T11 compression fracture as well as management strategy.    Thank you for this interesting consult.  I greatly enjoyed meeting KASSY MCENROE and look forward to participating in their care.  A copy of this report was sent to the requesting provider on this date.  Electronically Signed: Al Corpus Justine Cossin 11/25/2022, 2:08 PM

## 2022-11-26 ENCOUNTER — Telehealth: Payer: Self-pay

## 2022-11-26 ENCOUNTER — Other Ambulatory Visit: Payer: Self-pay | Admitting: Interventional Radiology

## 2022-11-26 ENCOUNTER — Other Ambulatory Visit: Payer: Self-pay | Admitting: Physician Assistant

## 2022-11-26 ENCOUNTER — Other Ambulatory Visit (HOSPITAL_COMMUNITY): Payer: Self-pay | Admitting: Sports Medicine

## 2022-11-26 DIAGNOSIS — S22080A Wedge compression fracture of T11-T12 vertebra, initial encounter for closed fracture: Secondary | ICD-10-CM

## 2022-11-26 DIAGNOSIS — M8000XA Age-related osteoporosis with current pathological fracture, unspecified site, initial encounter for fracture: Secondary | ICD-10-CM

## 2022-11-26 DIAGNOSIS — Z8781 Personal history of (healed) traumatic fracture: Secondary | ICD-10-CM

## 2022-11-26 DIAGNOSIS — R5383 Other fatigue: Secondary | ICD-10-CM | POA: Diagnosis not present

## 2022-11-26 DIAGNOSIS — M810 Age-related osteoporosis without current pathological fracture: Secondary | ICD-10-CM | POA: Diagnosis not present

## 2022-11-26 NOTE — Telephone Encounter (Signed)
Error

## 2022-11-26 NOTE — Telephone Encounter (Signed)
Dr. Hoy Register can be reached at  [phone]

## 2022-11-26 NOTE — Telephone Encounter (Signed)
Called Dr. Mikki Santee voicemail message.  Will get blood work as requested and have them sent to her.  Okay with Reclast-agree with what ever she recommends

## 2022-11-27 LAB — CBC
HCT: 43.1 % (ref 35.0–45.0)
Hemoglobin: 13.9 g/dL (ref 11.7–15.5)
MCH: 29.6 pg (ref 27.0–33.0)
MCHC: 32.3 g/dL (ref 32.0–36.0)
MCV: 91.9 fL (ref 80.0–100.0)
MPV: 11.5 fL (ref 7.5–12.5)
Platelets: 252 10*3/uL (ref 140–400)
RBC: 4.69 10*6/uL (ref 3.80–5.10)
RDW: 12.8 % (ref 11.0–15.0)
WBC: 7.9 10*3/uL (ref 3.8–10.8)

## 2022-11-27 LAB — BASIC METABOLIC PANEL WITH GFR
BUN: 17 mg/dL (ref 7–25)
CO2: 30 mmol/L (ref 20–32)
Calcium: 10.9 mg/dL — ABNORMAL HIGH (ref 8.6–10.4)
Chloride: 102 mmol/L (ref 98–110)
Creat: 0.61 mg/dL (ref 0.60–0.95)
Glucose, Bld: 84 mg/dL (ref 65–139)
Potassium: 4.3 mmol/L (ref 3.5–5.3)
Sodium: 141 mmol/L (ref 135–146)
eGFR: 89 mL/min/{1.73_m2} (ref >=60)

## 2022-12-03 ENCOUNTER — Other Ambulatory Visit (INDEPENDENT_AMBULATORY_CARE_PROVIDER_SITE_OTHER): Payer: PPO

## 2022-12-03 ENCOUNTER — Other Ambulatory Visit: Payer: Self-pay | Admitting: Internal Medicine

## 2022-12-03 DIAGNOSIS — E039 Hypothyroidism, unspecified: Secondary | ICD-10-CM | POA: Diagnosis not present

## 2022-12-03 DIAGNOSIS — M8000XG Age-related osteoporosis with current pathological fracture, unspecified site, subsequent encounter for fracture with delayed healing: Secondary | ICD-10-CM

## 2022-12-03 LAB — COMPREHENSIVE METABOLIC PANEL
ALT: 10 U/L (ref 0–35)
AST: 16 U/L (ref 0–37)
Albumin: 3.9 g/dL (ref 3.5–5.2)
Alkaline Phosphatase: 46 U/L (ref 39–117)
BUN: 17 mg/dL (ref 6–23)
CO2: 32 mEq/L (ref 19–32)
Calcium: 10.3 mg/dL (ref 8.4–10.5)
Chloride: 101 mEq/L (ref 96–112)
Creatinine, Ser: 0.64 mg/dL (ref 0.40–1.20)
GFR: 81.58 mL/min (ref >60.00)
Glucose, Bld: 99 mg/dL (ref 70–99)
Potassium: 4 mEq/L (ref 3.5–5.1)
Sodium: 140 mEq/L (ref 135–145)
Total Bilirubin: 1.1 mg/dL (ref 0.2–1.2)
Total Protein: 6.9 g/dL (ref 6.0–8.3)

## 2022-12-03 LAB — VITAMIN D 25 HYDROXY (VIT D DEFICIENCY, FRACTURES): VITD: 37.54 ng/mL (ref 30.00–100.00)

## 2022-12-03 LAB — TSH: TSH: 4.4 u[IU]/mL (ref 0.35–5.50)

## 2022-12-03 NOTE — Discharge Instructions (Signed)
Kyphoplasty Post Procedure Discharge Instructions  May resume a regular diet and any medications that you routinely take (including pain medications). However, if you are taking Aspirin or an anticoagulant/blood thinner you will be told when you can resume taking these by the healthcare provider. No driving day of procedure. The day of your procedure take it easy. You may use an ice pack as needed to injection sites on back.  Ice to back 30 minutes on and 30 minutes off, as needed. May remove bandaids tomorrow after taking a shower. Replace daily with a clean bandaid until healed.  Do not lift anything heavier than a milk jug for 1-2 weeks or determined by your physician.  Follow up with your physician in 2 weeks.    Please contact our office at [phone] for the following symptoms or if you have any questions:  Fever greater than 100 degrees Increased swelling, pain, or redness at injection site. Increased back and/or leg pain New numbness or change in symptoms from before the procedure.    Thank you for visiting [facility]. 

## 2022-12-04 ENCOUNTER — Ambulatory Visit
Admission: RE | Admit: 2022-12-04 | Discharge: 2022-12-04 | Disposition: A | Discharge status: Home / Self Care | Payer: PPO | Source: Ambulatory Visit | Attending: Physician Assistant | Admitting: Physician Assistant

## 2022-12-04 DIAGNOSIS — S22089A Unspecified fracture of T11-T12 vertebra, initial encounter for closed fracture: Secondary | ICD-10-CM | POA: Diagnosis not present

## 2022-12-04 DIAGNOSIS — M8000XA Age-related osteoporosis with current pathological fracture, unspecified site, initial encounter for fracture: Secondary | ICD-10-CM

## 2022-12-04 DIAGNOSIS — S22080A Wedge compression fracture of T11-T12 vertebra, initial encounter for closed fracture: Secondary | ICD-10-CM

## 2022-12-04 HISTORY — PX: IR KYPHO THORACIC WITH BONE BIOPSY: IMG5518

## 2022-12-04 MED ORDER — ACETAMINOPHEN 10 MG/ML IV SOLN
1000.0000 mg | Freq: Once | INTRAVENOUS | Status: AC
  Administered 2022-12-04: 1000 mg via INTRAVENOUS

## 2022-12-04 MED ORDER — MIDAZOLAM HCL 2 MG/2ML IJ SOLN
1.0000 mg | INTRAMUSCULAR | Status: DC | PRN
  Administered 2022-12-04 (×2): 0.5 mg via INTRAVENOUS

## 2022-12-04 MED ORDER — SODIUM CHLORIDE 0.9 % IV SOLN: INTRAVENOUS | Status: DC

## 2022-12-04 MED ORDER — KETOROLAC TROMETHAMINE 30 MG/ML IJ SOLN: 30.0000 mg | Freq: Once | INTRAMUSCULAR | Status: DC

## 2022-12-04 MED ORDER — CEFAZOLIN SODIUM-DEXTROSE 2-4 GM/100ML-% IV SOLN
2.0000 g | INTRAVENOUS | Status: AC
  Administered 2022-12-04: 2 g via INTRAVENOUS

## 2022-12-04 MED ORDER — FENTANYL CITRATE PF 50 MCG/ML IJ SOSY
25.0000 ug | PREFILLED_SYRINGE | INTRAMUSCULAR | Status: DC | PRN
  Administered 2022-12-04 (×2): 25 ug via INTRAVENOUS

## 2022-12-04 NOTE — Progress Notes (Signed)
Pt back in nursing recovery area. Pt still drowsy from procedure but will wake up when spoken to. Pt follows commands, talks in complete sentences and has no complaints at this time. Pt will remain in nursing station until discharge.  ?

## 2022-12-08 ENCOUNTER — Encounter (HOSPITAL_COMMUNITY): Payer: PPO

## 2022-12-08 ENCOUNTER — Other Ambulatory Visit (HOSPITAL_COMMUNITY): Payer: PPO

## 2022-12-09 ENCOUNTER — Ambulatory Visit (HOSPITAL_COMMUNITY)
Admission: RE | Admit: 2022-12-09 | Discharge: 2022-12-09 | Disposition: A | Discharge status: Home / Self Care | Payer: PPO | Source: Ambulatory Visit | Attending: Sports Medicine | Admitting: Sports Medicine

## 2022-12-09 ENCOUNTER — Encounter (HOSPITAL_COMMUNITY)
Admission: RE | Admit: 2022-12-09 | Discharge: 2022-12-09 | Disposition: A | Discharge status: Home / Self Care | Payer: PPO | Source: Ambulatory Visit | Attending: Sports Medicine | Admitting: Sports Medicine

## 2022-12-09 DIAGNOSIS — Z8781 Personal history of (healed) traumatic fracture: Secondary | ICD-10-CM

## 2022-12-09 DIAGNOSIS — M549 Dorsalgia, unspecified: Secondary | ICD-10-CM | POA: Diagnosis not present

## 2022-12-09 MED ORDER — TECHNETIUM TC 99M MEDRONATE IV KIT
20.0000 | PACK | Freq: Once | INTRAVENOUS | Status: AC | PRN
  Administered 2022-12-09: 20 via INTRAVENOUS

## 2022-12-09 MED ORDER — TECHNETIUM TC 99M MEDRONATE IV KIT: 20.0000 | PACK | Freq: Once | INTRAVENOUS | Status: DC | PRN

## 2022-12-12 LAB — PTH, INTACT AND CALCIUM: PTH: 10 pg/mL — ABNORMAL LOW (ref 16–77)

## 2022-12-17 ENCOUNTER — Other Ambulatory Visit: Payer: Self-pay

## 2022-12-17 DIAGNOSIS — M810 Age-related osteoporosis without current pathological fracture: Secondary | ICD-10-CM | POA: Insufficient documentation

## 2022-12-17 DIAGNOSIS — Z961 Presence of intraocular lens: Secondary | ICD-10-CM | POA: Diagnosis not present

## 2022-12-17 DIAGNOSIS — H02831 Dermatochalasis of right upper eyelid: Secondary | ICD-10-CM | POA: Diagnosis not present

## 2022-12-17 DIAGNOSIS — H52203 Unspecified astigmatism, bilateral: Secondary | ICD-10-CM | POA: Diagnosis not present

## 2022-12-17 DIAGNOSIS — H02834 Dermatochalasis of left upper eyelid: Secondary | ICD-10-CM | POA: Diagnosis not present

## 2022-12-18 ENCOUNTER — Telehealth: Payer: Self-pay | Admitting: Pharmacy Technician

## 2022-12-18 NOTE — Telephone Encounter (Signed)
Auth Submission: NO AUTH NEEDED Site of care: Site of care: [facility] Payer: healthtea advt Medication & CPT/J Code(s) submitted: Reclast (Zolendronic acid) [id number] Route of submission (phone, fax, portal):  Phone # Fax # Auth type: Buy/Bill Units/visits requested: 1 Reference number:  Approval from: 12/18/22 to 07/28/23

## 2022-12-19 ENCOUNTER — Telehealth: Payer: Self-pay

## 2022-12-19 NOTE — Telephone Encounter (Signed)
Phone call to pt to follow up from her kyphoplasty on 12/04/22. Pt reports her pain is completely gone post procedure but is still having "a little soreness". Pt reports she is able to move around a little better. Pt denies any signs of infection, redness at the site, draining or fever. Pt has no complaints at this time and will be scheduled for a telephone follow up with Dr. Archer Asa next week. Pt advised to call back if anything were to change or any concerns arise and we will arrange an in person appointment. Pt verbalized understanding.

## 2022-12-23 ENCOUNTER — Other Ambulatory Visit: Payer: Self-pay | Admitting: Interventional Radiology

## 2022-12-23 DIAGNOSIS — Z09 Encounter for follow-up examination after completed treatment for conditions other than malignant neoplasm: Secondary | ICD-10-CM

## 2022-12-27 IMAGING — MG MM DIGITAL SCREENING BILAT W/ TOMO AND CAD
8 series · 8 of 24 positions shown · non-contrast
Comparison: Previous exam(s).

CLINICAL DATA: Screening.

EXAM:
DIGITAL SCREENING BILATERAL MAMMOGRAM WITH TOMOSYNTHESIS AND CAD
TECHNIQUE: Bilateral screening digital craniocaudal and mediolateral oblique
mammograms were obtained. Bilateral screening digital breast
tomosynthesis was performed. The images were evaluated with
computer-aided detection.

[R MLO synth-2D]
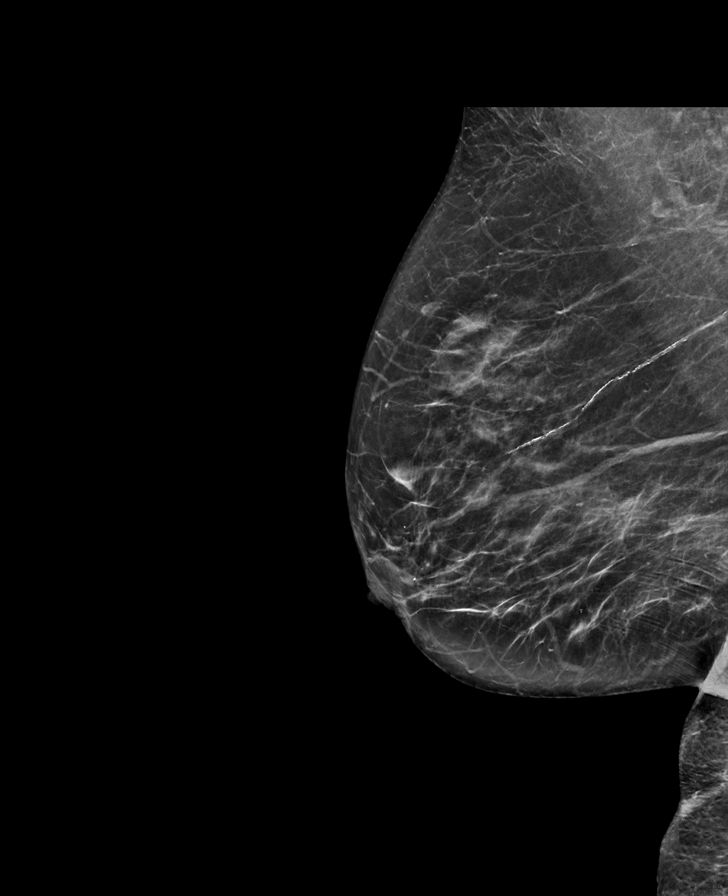

[L CC synth-2D]
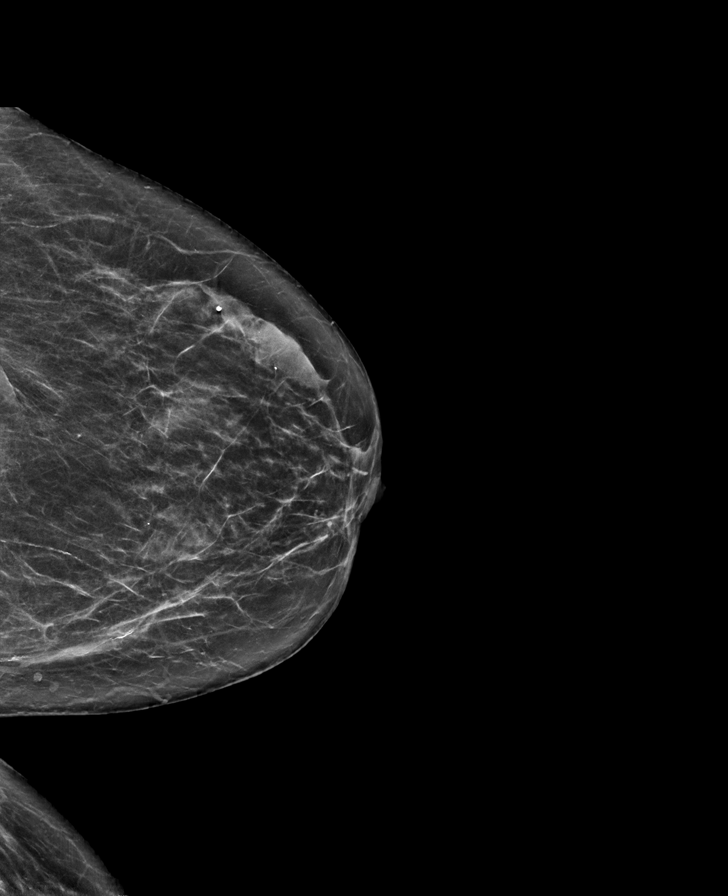

[R CC synth-2D]
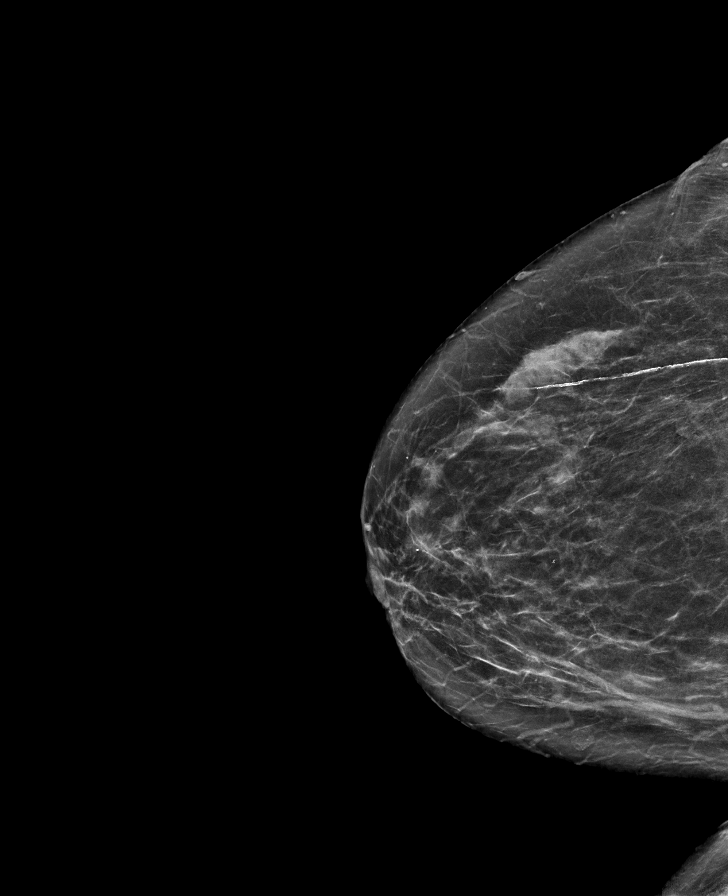

[L MLO synth-2D]
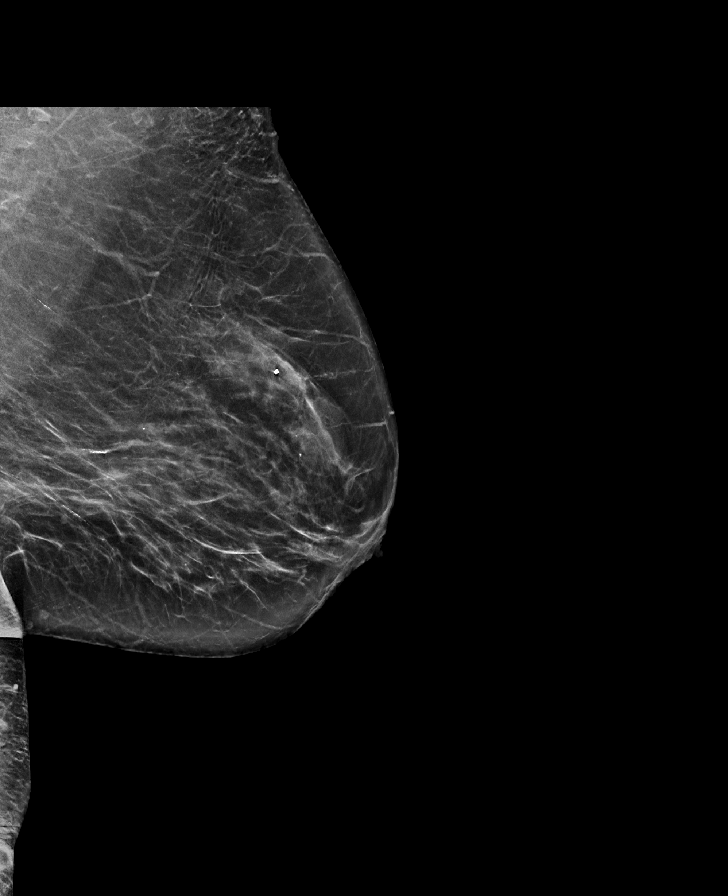

[L MLO tomo · tomo slice 37/72.0]
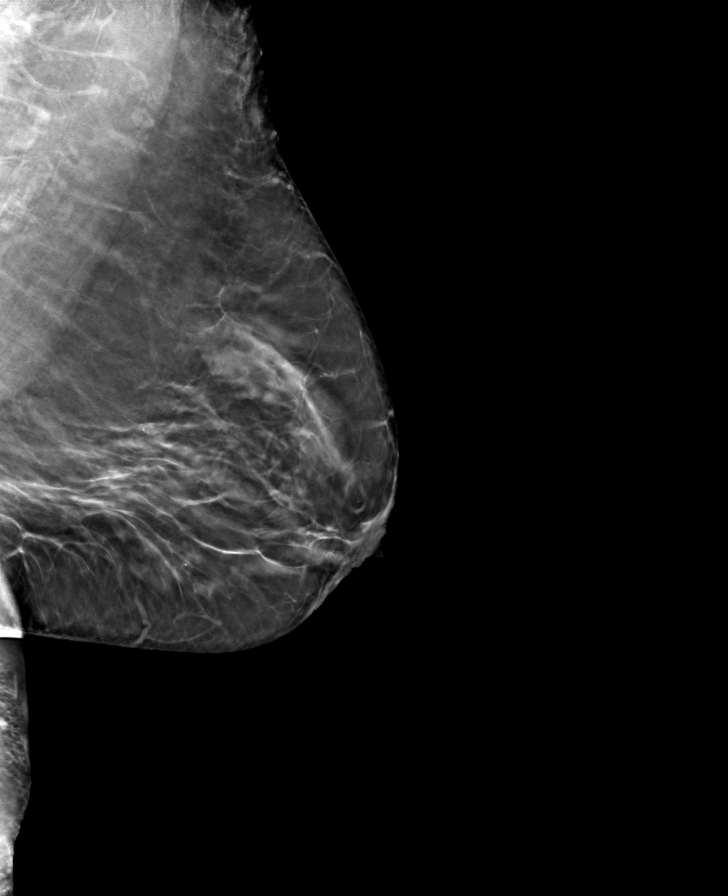

[L CC tomo · tomo slice 32/63.0]
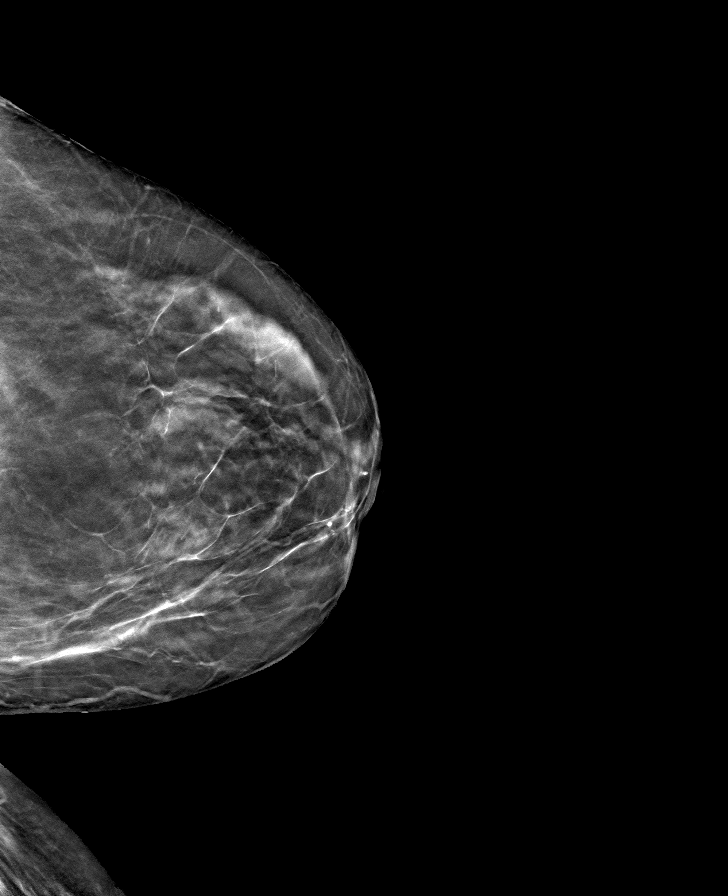

[R CC tomo · tomo slice 33/65.0]
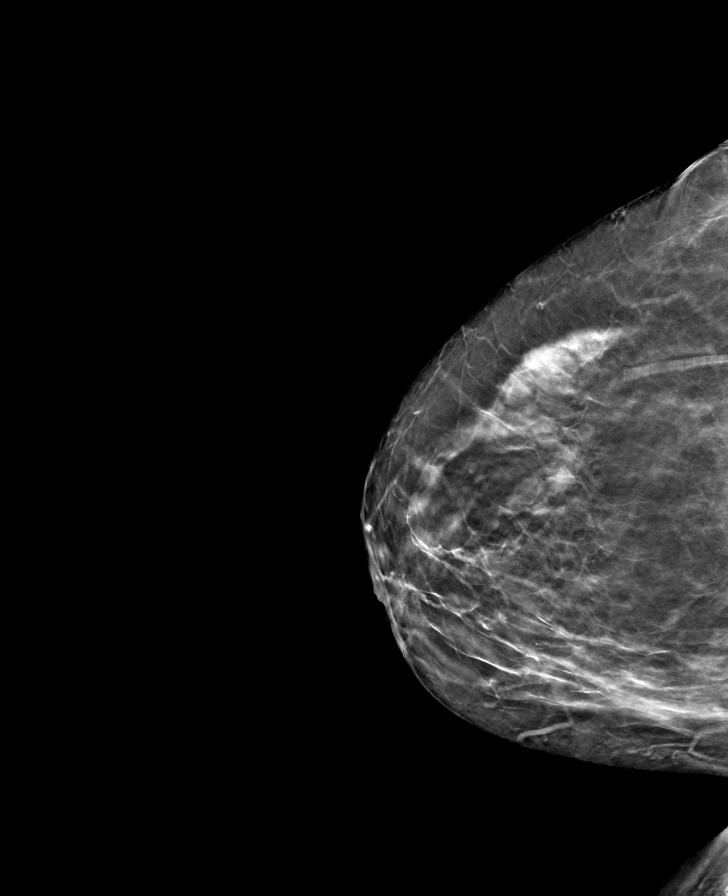

[R MLO tomo · tomo slice 33/66.0]
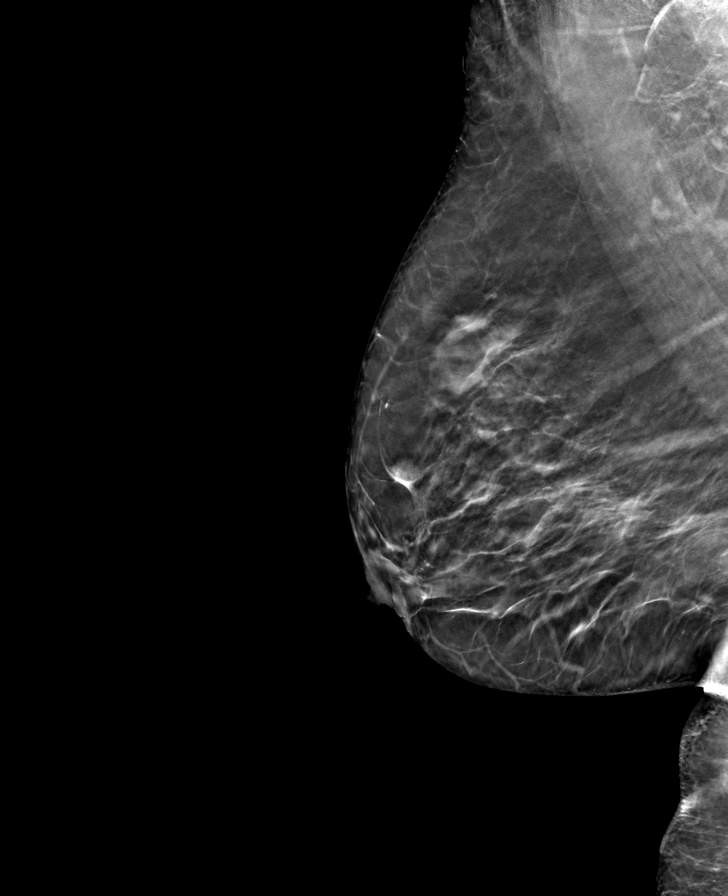

[8 of 24 positions shown; findings below may reference images not displayed]

ACR Breast Density Category c: The breast tissue is heterogeneously
dense, which may obscure small masses.
FINDINGS: There are no findings suspicious for malignancy.
IMPRESSION: No mammographic evidence of malignancy. A result letter of this
screening mammogram will be mailed directly to the patient.

RECOMMENDATION:
Screening mammogram in one year. (Code:Q3-W-BC3)

BI-RADS CATEGORY  1: Negative.

## 2022-12-30 ENCOUNTER — Ambulatory Visit
Admission: RE | Admit: 2022-12-30 | Discharge: 2022-12-30 | Disposition: A | Discharge status: Home / Self Care | Payer: PPO | Source: Ambulatory Visit | Attending: Interventional Radiology | Admitting: Interventional Radiology

## 2022-12-30 DIAGNOSIS — Z09 Encounter for follow-up examination after completed treatment for conditions other than malignant neoplasm: Secondary | ICD-10-CM

## 2022-12-30 DIAGNOSIS — S22080A Wedge compression fracture of T11-T12 vertebra, initial encounter for closed fracture: Secondary | ICD-10-CM | POA: Diagnosis not present

## 2022-12-30 DIAGNOSIS — M8008XA Age-related osteoporosis with current pathological fracture, vertebra(e), initial encounter for fracture: Secondary | ICD-10-CM | POA: Diagnosis not present

## 2022-12-30 NOTE — Progress Notes (Signed)
Chief Complaint: Patient was consulted remotely today (TeleHealth) for T11 kyphoplasty at the request of Alpha Chouinard K.    Referring Physician(s): Meloni Hinz K  History of Present Illness: [name] [name] is a 82 y.o. female With a history of highly symptomatic T11 compression fracture.  She underwent cement augmentation with balloon kyphoplasty on 12/04/2022.  We spoke over the phone today for her follow-up evaluation.  She is pleased to report that she feels much better.  Her back pain has been improving steadily day by day.  She is resuming her normal activities.  She let me know that this coming Monday as she is scheduled to have a Reclast infusion to help with her advanced osteoporosis.  Overall, she has no complaints right now.  Past Medical History:  Diagnosis Date   Benign neoplasm of colon    Cataract    bilateral,had surgery   DDD (degenerative disc disease), lumbar    MRI w. [facility] 05/2015, s/p ESI x 1 w/ improvement   Diaphragmatic hernia without mention of obstruction or gangrene    Diverticulosis of colon (without mention of hemorrhage)    DJD (degenerative joint disease) of knee    L>R, IA cortisone by [facility] in L 03/2015   Esophageal reflux    Gallstones    History of chicken pox    Hyperlipidemia    Hypertension    Irritable bowel syndrome    Migraines    Obesity, unspecified    Osteoporosis, unspecified    Tachycardia    Unspecified hemorrhoids with other complication     Past Surgical History:  Procedure Laterality Date   APPENDECTOMY  1979   CATARACT EXTRACTION     CHOLECYSTECTOMY  1989   COLONOSCOPY     IR KYPHO THORACIC WITH BONE BIOPSY  10/28/2022   IR KYPHO THORACIC WITH BONE BIOPSY  12/04/2022   IR RADIOLOGIST EVAL & MGMT  10/15/2022   IR RADIOLOGIST EVAL & MGMT  11/11/2022   IR RADIOLOGIST EVAL & MGMT  11/25/2022   POLYPECTOMY     VAGINAL HYSTERECTOMY  1996    Allergies: Patient has no known allergies.  Medications: Prior to  Admission medications   Medication Sig Start Date End Date Taking? Authorizing Provider  acetaminophen (TYLENOL) 650 MG CR tablet Take 650 mg by mouth every 8 (eight) hours as needed for pain.    [provider]  Biotin 5 MG CAPS Taking daily 11/01/15   Pincus Sanes, MD  bisoprolol-hydrochlorothiazide [facility]) 5-6.25 MG tablet TAKE 1 TABLET BY MOUTH EVERY DAY 10/20/22   Pincus Sanes, MD  CALCIUM CITRATE-VITAMIN D PO Take 600 mg by mouth in the morning and at bedtime.    [provider]  denosumab (PROLIA) 60 MG/ML SOLN injection Inject 60 mg into the skin every 6 (six) months. Administer in upper arm, thigh, or abdomen    [provider]  famotidine (PEPCID) 20 MG tablet Take 1 tablet (20 mg total) by mouth 2 (two) times daily as needed for heartburn or indigestion. 10/01/22   Pincus Sanes, MD  levothyroxine (SYNTHROID) 50 MCG tablet Take 1 tablet (50 mcg total) by mouth daily. Take 6 days a week only 10/04/22   Pincus Sanes, MD  Multiple Vitamins-Minerals (CENTRUM SILVER PO) Take by mouth daily.    [provider]  polyethylene glycol (MIRALAX / GLYCOLAX) 17 g packet Take 17 g by mouth daily. 2 capfuls daily 05/10/22   Ellsworth Lennox, PA-C  pravastatin (PRAVACHOL)  40 MG tablet TAKE 1 TABLET BY MOUTH EVERY DAY 10/07/22   Burns, Bobette Mo, MD  PROLIA 60 MG/ML SOSY injection INJECT 60 MG INTO THE SKIN ONCE FOR 1 DOSE 10/22/21   Pincus Sanes, MD  traMADol (ULTRAM) 50 MG tablet Take 1 tablet (50 mg total) by mouth every 12 (twelve) hours as needed for severe pain. 06/16/22   Pincus Sanes, MD  TYMLOS 3120 MCG/1.56ML SOPN SMARTSIG:1 Pre-Filled Pen Syringe SUB-Q Every Evening 07/09/22   [provider]  Wheat Dextrin [facility]) POWD Twice daily 05/01/17   Pincus Sanes, MD     Family History  Problem Relation Age of Onset   Stroke Mother    Dementia Mother    Stroke Father    Heart disease Sister        x 2   Colon cancer Sister    Stroke Sister 62    Heart disease Brother     Social History   Socioeconomic History   Marital status: Married    Spouse name: Iantha Fallen   Number of children: 2   Years of education: Not on file   Highest education level: Not on file  Occupational History   Occupation: retired  Tobacco Use   Smoking status: Never   Smokeless tobacco: Never  Vaping Use   Vaping Use: Never used  Substance and Sexual Activity   Alcohol use: No    Alcohol/week: 0.0 standard drinks of alcohol   Drug use: No   Sexual activity: Not Currently  Other Topics Concern   Not on file  Social History Narrative   Exercise: no regular exercise   One daughter in [place], one in [place]   Social Determinants of Health   Financial Resource Strain: Low Risk  (08/18/2022)   Overall Financial Resource Strain (CARDIA)    Difficulty of Paying Living Expenses: Not hard at all  Food Insecurity: No Food Insecurity (08/18/2022)   Hunger Vital Sign    Worried About Running Out of Food in the Last Year: Never true    Ran Out of Food in the Last Year: Never true  Transportation Needs: No Transportation Needs (08/18/2022)   PRAPARE - Administrator, Civil Service (Medical): No    Lack of Transportation (Non-Medical): No  Physical Activity: Insufficiently Active (08/18/2022)   Exercise Vital Sign    Days of Exercise per Week: 7 days    Minutes of Exercise per Session: 20 min  Stress: No Stress Concern Present (08/18/2022)   [employer] of Occupational Health - Occupational Stress Questionnaire    Feeling of Stress : Not at all  Social Connections: Socially Integrated (08/18/2022)   Social Connection and Isolation Panel [NHANES]    Frequency of Communication with Friends and Family: More than three times a week    Frequency of Social Gatherings with Friends and Family: More than three times a week    Attends Religious Services: More than 4 times per year    Active Member of [employer] or Organizations: Yes    Attends Probation officer: More than 4 times per year    Marital Status: Married    Review of Systems  Review of Systems: A 12 point ROS discussed and pertinent positives are indicated in the HPI above.  All other systems are negative.  Advance Care Plan: The advanced care plan/surrogate decision maker was discussed at the time of visit and the patient did not wish to discuss or was not  able to name a surrogate decision maker or provide an advance care plan.    Physical Exam No direct physical exam was performed (except for noted visual exam findings with Video Visits).    Vital Signs: There were no vitals taken for this visit.  Imaging: DG Radiologist Eval And Mgmt  Result Date: 12/30/2022 EXAM: NEW PATIENT OFFICE VISIT CHIEF COMPLAINT: SEE EPIC NOTE HISTORY OF PRESENT ILLNESS: SEE EPIC NOTE REVIEW OF SYSTEMS: SEE EPIC NOTE PHYSICAL EXAMINATION: SEE EPIC NOTE ASSESSMENT AND PLAN: SEE EPIC NOTE Electronically Signed   By: Malachy Moan M.D.   On: 12/30/2022 16:21   NM Bone Scan Whole Body  Result Date: 12/13/2022 CLINICAL DATA:  Back pain, no history of cancer. EXAM: NUCLEAR MEDICINE WHOLE BODY BONE SCAN TECHNIQUE: Whole body anterior and posterior images were obtained approximately 3 hours after intravenous injection of radiopharmaceutical. RADIOPHARMACEUTICALS:  20.4 mCi Technetium-44m MDP IV COMPARISON:  MR thoracic and lumbar spine 09/29/2022. FINDINGS: Uptake associated with known T10 compression fracture, described as acute on chronic on 09/29/2022. Degenerative uptake in the lower lumbar spine, knees, ankles and feet. IMPRESSION: Uptake associated with a known T10 compression fracture, described as acute on chronic on 09/29/2022. No additional abnormal uptake in the axial or appendicular skeleton. Electronically Signed   By: Leanna Battles M.D.   On: 12/13/2022 16:38   IR KYPHO THORACIC WITH BONE BIOPSY  Result Date: 12/04/2022 CLINICAL DATA:  Acute on chronic fracture of the T11  thoracic body in the setting of osteoporosis. She presents for cement augmentation with balloon kyphoplasty. EXAM: FLUOROSCOPIC GUIDED KYPHOPLASTY OF THE T11 VERTEBRAL BODY COMPARISON:  None Available. MEDICATIONS: As antibiotic prophylaxis, 2 g Ancef was ordered pre-procedure and administered intravenously within 1 hour of incision. Following the procedure, 1000 mg Tylenol was administered intravenously. ANESTHESIA/SEDATION: Moderate (conscious) sedation was employed during this procedure. A total of Versed 1 mg and Fentanyl 50 mcg was administered intravenously. Moderate Sedation Time: 15 minutes. The patient's level of consciousness and vital signs were monitored continuously by radiology nursing throughout the procedure under my direct supervision. FLUOROSCOPY TIME:  Radiation exposure index: 21.2 mGy reference air kerma) COMPLICATIONS: None immediate. PROCEDURE: The procedure, risks (including but not limited to bleeding, infection, organ damage), benefits, and alternatives were explained to the patient. Questions regarding the procedure were encouraged and answered. The patient understands and consents to the procedure. The patient has suffered a fracture of the T11. It is recommended that patients aged 14 years or older be evaluated for possible testing or treatment of osteoporosis. The patient was placed prone on the fluoroscopic table. The skin overlying the upper thoracic region was then prepped and draped in the usual sterile fashion. Maximal barrier sterile technique was utilized including caps, mask, sterile gowns, sterile gloves, sterile drape, hand hygiene and skin antiseptic. Intravenous Fentanyl and Versed were administered as conscious sedation during continuous cardiorespiratory monitoring by the radiology RN. The left pedicle at T11 was then infiltrated with 1% lidocaine followed by the advancement of a Kyphon trocar needle through the left pedicle into the posterior one-third of the vertebral  body. Subsequently, the osteo drill was advanced to the anterior third of the vertebral body. The osteo drill was retracted. Through the working cannula, a Kyphon inflatable bone tamp 15 x 2.5 was advanced and positioned with the distal marker approximately 5 mm from the anterior aspect of the cortex. Appropriate positioning was confirmed on the AP projection. At this time, the balloon was expanded using contrast via a [address]  inflation syringe device via micro tubing. Inflations were continued until there was near apposition with the superior end plate. At this time, methylmethacrylate mixture was reconstituted in the Kyphon bone mixing device system. This was then loaded into the delivery mechanism, attached to Kyphon bone fillers. The balloons were deflated and removed followed by the instillation of methylmethacrylate mixture with excellent filling in the AP and lateral projections. No extravasation was noted in the disk spaces or posteriorly into the spinal canal. No epidural venous contamination was seen. The working cannulae and the bone filler were then retrieved and removed. Hemostasis was achieved with manual compression. The patient tolerated the procedure well without immediate postprocedural complication. IMPRESSION: 1. Technically successful T11 vertebral body augmentation using balloon kyphoplasty. 2. Per CMS PQRS reporting requirements (PQRS Measure 24): Given the patient's age of greater than 50 and the fracture site (hip, distal radius, or spine), the patient should be tested for osteoporosis using DXA, and the appropriate treatment considered based on the DXA results. Electronically Signed   By: Malachy Moan M.D.   On: 12/04/2022 12:03    Labs:  CBC: Recent Labs    04/02/22 1038 05/12/22 1044 05/14/22 0234 11/25/22 1030  WBC 7.9 8.6 10.1 7.9  HGB 13.4 14.7 14.4 13.9  HCT 40.1 44.0 42.0 43.1  PLT 212.0 264 261 252    COAGS: No results for input(s): "INR", "APTT" in the last  8760 hours.  BMP: Recent Labs    05/12/22 1044 05/14/22 0234 10/01/22 1023 11/25/22 1030 12/03/22 1054  NA 138 136 142 141 140  K 3.8 3.7 4.4 4.3 4.0  CL 102 101 101 102 101  CO2 28 25 33* 30 32  GLUCOSE 111* 111* 94 84 99  BUN 10 21 16 17 17   CALCIUM 9.4 9.7 10.3 10.9* CANCELED  10.3  CREATININE 0.72 0.79 0.66 0.61 0.64  GFRNONAA >60 >60  --   --   --     LIVER FUNCTION TESTS: Recent Labs    05/12/22 1044 05/14/22 0234 10/01/22 1023 12/03/22 1054  BILITOT 1.3* 1.1 0.9 1.1  AST 20 24 21 16   ALT 15 17 15 10   ALKPHOS 50 50 36* 46  PROT 7.7 7.2 7.2 6.9  ALBUMIN 4.1 4.0 4.1 3.9    TUMOR MARKERS: No results for input(s): "AFPTM", "CEA", "CA199", "CHROMGRNA" in the last 8760 hours.  Assessment and Plan:  Very pleasant 82 year old female feeling significantly better 2 weeks status post T11 cement augmentation with kyphoplasty.  Her back pain is improving.  No further scheduled follow-up at this time.    Electronically Signed: Sterling Big 12/30/2022, 4:38 PM   I spent a total of  15 Minutes in remote  clinical consultation, greater than 50% of which was counseling/coordinating care for post T11 compression fracture.    Visit type: Audio only (telephone). Audio (no video) only due to patient preference. Alternative for in-person consultation at [facility], [address] [place], [place], [place]. This visit type was conducted due to national recommendations for restrictions regarding the COVID-19 Pandemic (e.g. social distancing).  This format is felt to be most appropriate for this patient at this time.  All issues noted in this document were discussed and addressed.

## 2022-12-31 ENCOUNTER — Ambulatory Visit (INDEPENDENT_AMBULATORY_CARE_PROVIDER_SITE_OTHER): Payer: PPO

## 2022-12-31 VITALS — BP 145/78 | HR 99 | Temp 97.4°F | Resp 16 | Ht <= 58 in | Wt 126.4 lb

## 2022-12-31 DIAGNOSIS — M810 Age-related osteoporosis without current pathological fracture: Secondary | ICD-10-CM | POA: Diagnosis not present

## 2022-12-31 MED ORDER — ZOLEDRONIC ACID 5 MG/100ML IV SOLN
5.0000 mg | Freq: Once | INTRAVENOUS | Status: AC
  Administered 2022-12-31: 5 mg via INTRAVENOUS
  Filled 2022-12-31: qty 100

## 2022-12-31 MED ORDER — DIPHENHYDRAMINE HCL 25 MG PO CAPS
25.0000 mg | ORAL_CAPSULE | Freq: Once | ORAL | Status: AC
  Administered 2022-12-31: 25 mg via ORAL
  Filled 2022-12-31: qty 1

## 2022-12-31 MED ORDER — ACETAMINOPHEN 325 MG PO TABS
650.0000 mg | ORAL_TABLET | Freq: Once | ORAL | Status: AC
  Administered 2022-12-31: 650 mg via ORAL
  Filled 2022-12-31: qty 2

## 2022-12-31 MED ORDER — SODIUM CHLORIDE 0.9 % IV SOLN: INTRAVENOUS | Status: DC

## 2022-12-31 NOTE — Progress Notes (Signed)
Diagnosis: Osteoporosis  Provider:  Chilton Greathouse MD  Procedure: IV Infusion  IV Type: Peripheral, IV Location: R Antecubital  Reclast (Zolendronic Acid), Dose: 5 mg  Infusion Start Time: 1407  Infusion Stop Time: 1439  Post Infusion IV Care: Observation period completed and Peripheral IV Discontinued  Discharge: Condition: Good, Destination: Home . AVS Provided  Performed by:  Garnette Czech, RN

## 2023-01-07 ENCOUNTER — Other Ambulatory Visit: Payer: Self-pay | Admitting: Internal Medicine

## 2023-01-07 DIAGNOSIS — I700 Atherosclerosis of aorta: Secondary | ICD-10-CM

## 2023-01-07 DIAGNOSIS — E7849 Other hyperlipidemia: Secondary | ICD-10-CM

## 2023-01-20 DIAGNOSIS — L821 Other seborrheic keratosis: Secondary | ICD-10-CM | POA: Diagnosis not present

## 2023-01-20 DIAGNOSIS — D225 Melanocytic nevi of trunk: Secondary | ICD-10-CM | POA: Diagnosis not present

## 2023-01-20 DIAGNOSIS — L814 Other melanin hyperpigmentation: Secondary | ICD-10-CM | POA: Diagnosis not present

## 2023-02-12 DIAGNOSIS — M810 Age-related osteoporosis without current pathological fracture: Secondary | ICD-10-CM | POA: Diagnosis not present

## 2023-02-12 DIAGNOSIS — R5383 Other fatigue: Secondary | ICD-10-CM | POA: Diagnosis not present

## 2023-02-13 DIAGNOSIS — L299 Pruritus, unspecified: Secondary | ICD-10-CM | POA: Diagnosis not present

## 2023-02-13 DIAGNOSIS — H6123 Impacted cerumen, bilateral: Secondary | ICD-10-CM | POA: Diagnosis not present

## 2023-04-05 ENCOUNTER — Encounter: Payer: Self-pay | Admitting: Internal Medicine

## 2023-04-05 NOTE — Progress Notes (Unsigned)
Subjective:    Patient ID: [name] [name], female    DOB: 02-21-1941, 82 y.o.   MRN: [id number]      HPI [name] [name] is here for a Physical exam and her chronic medical problems.    No changes.      Back pain is ok.  Getting up and down from a chair is difficulty  - She has pain in her upper legs/hip regions  - it gets better the more she moves.  She does not have any upper arm weakness or soreness.   She figured it was PMR.    Medications and allergies reviewed with patient and updated if appropriate.  Current Outpatient Medications on File Prior to Visit  Medication Sig Dispense Refill   acetaminophen (TYLENOL) 650 MG CR tablet Take 650 mg by mouth every 8 (eight) hours as needed for pain.     Biotin 5 MG CAPS Taking daily  0   bisoprolol-hydrochlorothiazide (ZIAC) 5-6.25 MG tablet TAKE 1 TABLET BY MOUTH EVERY DAY 90 tablet 1   CALCIUM CITRATE-VITAMIN D PO Take 600 mg by mouth in the morning and at bedtime.     denosumab (PROLIA) 60 MG/ML SOLN injection Inject 60 mg into the skin every 6 (six) months. Administer in upper arm, thigh, or abdomen     famotidine (PEPCID) 20 MG tablet Take 1 tablet (20 mg total) by mouth 2 (two) times daily as needed for heartburn or indigestion. 180 tablet 3   Multiple Vitamins-Minerals (CENTRUM SILVER PO) Take by mouth daily.     polyethylene glycol (MIRALAX / GLYCOLAX) 17 g packet Take 17 g by mouth daily. 2 capfuls daily 14 each 0   PROLIA 60 MG/ML SOSY injection INJECT 60 MG INTO THE SKIN ONCE FOR 1 DOSE 60 mL 1   traMADol (ULTRAM) 50 MG tablet Take 1 tablet (50 mg total) by mouth every 12 (twelve) hours as needed for severe pain. 10 tablet 0   TYMLOS 3120 MCG/1.56ML SOPN SMARTSIG:1 Pre-Filled Pen Syringe SUB-Q Every Evening     Wheat Dextrin (BENEFIBER) POWD Twice daily  0   No current facility-administered medications on file prior to visit.    Review of Systems  Constitutional:  Negative for fever.  Eyes:  Negative for visual disturbance.   Respiratory:  Negative for cough, shortness of breath and wheezing.   Cardiovascular:  Negative for chest pain, palpitations and leg swelling.  Gastrointestinal:  Positive for constipation (controlled -benefiber bid, miralax daily). Negative for abdominal pain, blood in stool and diarrhea.       No gerd  Genitourinary:  Negative for dysuria.  Musculoskeletal:  Positive for arthralgias (fingers). Negative for back pain.       B/l latera hip, upper leg pain  Skin:  Negative for rash.  Neurological:  Negative for light-headedness and headaches.  Psychiatric/Behavioral:  Negative for dysphoric mood. The patient is not nervous/anxious.        Objective:   Vitals:   04/06/23 0922  BP: 136/70  Pulse: 67  Temp: 98.1 F (36.7 C)  SpO2: 94%   Filed Weights   04/06/23 0922  Weight: 126 lb 9.6 oz (57.4 kg)   Body mass index is 26.46 kg/m.  BP Readings from Last 3 Encounters:  04/06/23 136/70  12/31/22 (!) 145/78  12/04/22 (!) 154/59    Wt Readings from Last 3 Encounters:  04/06/23 126 lb 9.6 oz (57.4 kg)  12/31/22 126 lb 6.4 oz (57.3 kg)  10/01/22 124 lb (56.2 kg)  Physical Exam Constitutional: She appears well-developed and well-nourished. No distress.  HENT:  Head: Normocephalic and atraumatic.  Right Ear: External ear normal. Normal ear canal and TM Left Ear: External ear normal.  Normal ear canal and TM Mouth/Throat: Oropharynx is clear and moist.  Eyes: Conjunctivae normal.  Neck: Neck supple. No tracheal deviation present. No thyromegaly present.  No carotid bruit  Cardiovascular: Normal rate, regular rhythm and normal heart sounds.   No murmur heard.  No edema. Pulmonary/Chest: Effort normal and breath sounds normal. No respiratory distress. She has no wheezes. She has no rales.  Breast: deferred   Abdominal: Soft. She exhibits no distension. There is no tenderness.  Lymphadenopathy: She has no cervical adenopathy.  Skin: Skin is warm and dry. She is not  diaphoretic.  Psychiatric: She has a normal mood and affect. Her behavior is normal.     Lab Results  Component Value Date   WBC 7.9 11/25/2022   HGB 13.9 11/25/2022   HCT 43.1 11/25/2022   PLT 252 11/25/2022   GLUCOSE 99 12/03/2022   CHOL 164 10/01/2022   TRIG 162.0 (H) 10/01/2022   HDL 47.80 10/01/2022   LDLDIRECT 158.0 06/27/2015   LDLCALC 84 10/01/2022   ALT 10 12/03/2022   AST 16 12/03/2022   NA 140 12/03/2022   K 4.0 12/03/2022   CL 101 12/03/2022   CREATININE 0.64 12/03/2022   BUN 17 12/03/2022   CO2 32 12/03/2022   TSH 4.40 12/03/2022   HGBA1C 5.7 10/01/2022         Assessment & Plan:   Physical exam: Screening blood work  ordered Exercise  housework, yard work [employer]  is good Substance abuse  none Up to date with eye doctor  Reviewed recommended immunizations.   Health Maintenance  Topic Date Due   INFLUENZA VACCINE  02/26/2023   MAMMOGRAM  07/01/2023   COVID-19 Vaccine (6 - 2023-24 season) 08/06/2023   Medicare Annual Wellness (AWV)  08/19/2023   DTaP/Tdap/Td (2 - Tdap) 10/08/2023   DEXA SCAN  04/03/2024   Pneumonia Vaccine 52+ Years old  Completed   Zoster Vaccines- Shingrix  Completed   HPV VACCINES  Aged Out   Colonoscopy  Discontinued          See Problem List for Assessment and Plan of chronic medical problems.

## 2023-04-05 NOTE — Patient Instructions (Addendum)
Blood work was ordered.   The lab is on the first floor.    Medications changes include :  none      Return in about 6 months (around 10/04/2023) for follow up.   Health Maintenance, Female Adopting a healthy lifestyle and getting preventive care are important in promoting health and wellness. Ask your health care provider about: The right schedule for you to have regular tests and exams. Things you can do on your own to prevent diseases and keep yourself healthy. What should I know about diet, weight, and exercise? Eat a healthy diet  Eat a diet that includes plenty of vegetables, fruits, low-fat dairy products, and lean protein. Do not eat a lot of foods that are high in solid fats, added sugars, or sodium. Maintain a healthy weight Body mass index (BMI) is used to identify weight problems. It estimates body fat based on height and weight. Your health care provider can help determine your BMI and help you achieve or maintain a healthy weight. Get regular exercise Get regular exercise. This is one of the most important things you can do for your health. Most adults should: Exercise for at least 150 minutes each week. The exercise should increase your heart rate and make you sweat (moderate-intensity exercise). Do strengthening exercises at least twice a week. This is in addition to the moderate-intensity exercise. Spend less time sitting. Even light physical activity can be beneficial. Watch cholesterol and blood lipids Have your blood tested for lipids and cholesterol at 84 years of age, then have this test every 5 years. Have your cholesterol levels checked more often if: Your lipid or cholesterol levels are high. You are older than 84 years of age. You are at high risk for heart disease. What should I know about cancer screening? Depending on your health history and family history, you may need to have cancer screening at various ages. This may include screening  for: Breast cancer. Cervical cancer. Colorectal cancer. Skin cancer. Lung cancer. What should I know about heart disease, diabetes, and high blood pressure? Blood pressure and heart disease High blood pressure causes heart disease and increases the risk of stroke. This is more likely to develop in people who have high blood pressure readings or are overweight. Have your blood pressure checked: Every 3-5 years if you are 65-20 years of age. Every year if you are 39 years old or older. Diabetes Have regular diabetes screenings. This checks your fasting blood sugar level. Have the screening done: Once every three years after age 66 if you are at a normal weight and have a low risk for diabetes. More often and at a younger age if you are overweight or have a high risk for diabetes. What should I know about preventing infection? Hepatitis B If you have a higher risk for hepatitis B, you should be screened for this virus. Talk with your health care provider to find out if you are at risk for hepatitis B infection. Hepatitis C Testing is recommended for: Everyone born from [birth year] through 1967. Anyone with known risk factors for hepatitis C. Sexually transmitted infections (STIs) Get screened for STIs, including gonorrhea and chlamydia, if: You are sexually active and are younger than 84 years of age. You are older than 84 years of age and your health care provider tells you that you are at risk for this type of infection. Your sexual activity has changed since you were last screened, and you are at  increased risk for chlamydia or gonorrhea. Ask your health care provider if you are at risk. Ask your health care provider about whether you are at high risk for HIV. Your health care provider may recommend a prescription medicine to help prevent HIV infection. If you choose to take medicine to prevent HIV, you should first get tested for HIV. You should then be tested every 3 months for as long as you  are taking the medicine. Pregnancy If you are about to stop having your period (premenopausal) and you may become pregnant, seek counseling before you get pregnant. Take 400 to 800 micrograms (mcg) of folic acid every day if you become pregnant. Ask for birth control (contraception) if you want to prevent pregnancy. Osteoporosis and menopause Osteoporosis is a disease in which the bones lose minerals and strength with aging. This can result in bone fractures. If you are 60 years old or older, or if you are at risk for osteoporosis and fractures, ask your health care provider if you should: Be screened for bone loss. Take a calcium or vitamin D supplement to lower your risk of fractures. Be given hormone replacement therapy (HRT) to treat symptoms of menopause. Follow these instructions at home: Alcohol use Do not drink alcohol if: Your health care provider tells you not to drink. You are pregnant, may be pregnant, or are planning to become pregnant. If you drink alcohol: Limit how much you have to: 0-1 drink a day. Know how much alcohol is in your drink. In the [place] one drink equals one 12 oz bottle of beer (355 mL), one 5 oz glass of wine (148 mL), or one 1 oz glass of hard liquor (44 mL). Lifestyle Do not use any products that contain nicotine or tobacco. These products include cigarettes, chewing tobacco, and vaping devices, such as e-cigarettes. If you need help quitting, ask your health care provider. Do not use street drugs. Do not share needles. Ask your health care provider for help if you need support or information about quitting drugs. General instructions Schedule regular health, dental, and eye exams. Stay current with your vaccines. Tell your health care provider if: You often feel depressed. You have ever been abused or do not feel safe at home. Summary Adopting a healthy lifestyle and getting preventive care are important in promoting health and wellness. Follow your  health care provider's instructions about healthy diet, exercising, and getting tested or screened for diseases. Follow your health care provider's instructions on monitoring your cholesterol and blood pressure. This information is not intended to replace advice given to you by your health care provider. Make sure you discuss any questions you have with your health care provider. Document Revised: 12/03/2020 Document Reviewed: 12/03/2020 Elsevier Patient Education  2024 [employer].

## 2023-04-06 ENCOUNTER — Other Ambulatory Visit: Payer: Self-pay | Admitting: Internal Medicine

## 2023-04-06 ENCOUNTER — Ambulatory Visit (INDEPENDENT_AMBULATORY_CARE_PROVIDER_SITE_OTHER): Payer: PPO | Admitting: Internal Medicine

## 2023-04-06 ENCOUNTER — Other Ambulatory Visit (HOSPITAL_BASED_OUTPATIENT_CLINIC_OR_DEPARTMENT_OTHER): Payer: Self-pay

## 2023-04-06 VITALS — BP 136/70 | HR 67 | Temp 98.1°F | Ht <= 58 in | Wt 126.6 lb

## 2023-04-06 DIAGNOSIS — Z0000 Encounter for general adult medical examination without abnormal findings: Secondary | ICD-10-CM

## 2023-04-06 DIAGNOSIS — I700 Atherosclerosis of aorta: Secondary | ICD-10-CM

## 2023-04-06 DIAGNOSIS — E063 Autoimmune thyroiditis: Secondary | ICD-10-CM

## 2023-04-06 DIAGNOSIS — I10 Essential (primary) hypertension: Secondary | ICD-10-CM | POA: Diagnosis not present

## 2023-04-06 DIAGNOSIS — R7303 Prediabetes: Secondary | ICD-10-CM | POA: Diagnosis not present

## 2023-04-06 DIAGNOSIS — M353 Polymyalgia rheumatica: Secondary | ICD-10-CM

## 2023-04-06 DIAGNOSIS — M8000XD Age-related osteoporosis with current pathological fracture, unspecified site, subsequent encounter for fracture with routine healing: Secondary | ICD-10-CM | POA: Diagnosis not present

## 2023-04-06 DIAGNOSIS — K219 Gastro-esophageal reflux disease without esophagitis: Secondary | ICD-10-CM | POA: Diagnosis not present

## 2023-04-06 DIAGNOSIS — E7849 Other hyperlipidemia: Secondary | ICD-10-CM

## 2023-04-06 DIAGNOSIS — E038 Other specified hypothyroidism: Secondary | ICD-10-CM | POA: Diagnosis not present

## 2023-04-06 LAB — CBC WITH DIFFERENTIAL/PLATELET
Basophils Absolute: 0.1 10*3/uL (ref 0.0–0.1)
Basophils Relative: 0.6 % (ref 0.0–3.0)
Eosinophils Absolute: 0.1 10*3/uL (ref 0.0–0.7)
Eosinophils Relative: 1.6 % (ref 0.0–5.0)
HCT: 41.2 % (ref 36.0–46.0)
Hemoglobin: 13.7 g/dL (ref 12.0–15.0)
Lymphocytes Relative: 31.4 % (ref 12.0–46.0)
Lymphs Abs: 2.5 10*3/uL (ref 0.7–4.0)
MCHC: 33.1 g/dL (ref 30.0–36.0)
MCV: 90.2 fl (ref 78.0–100.0)
Monocytes Absolute: 0.8 10*3/uL (ref 0.1–1.0)
Monocytes Relative: 10.2 % (ref 3.0–12.0)
Neutro Abs: 4.5 10*3/uL (ref 1.4–7.7)
Neutrophils Relative %: 56.2 % (ref 43.0–77.0)
Platelets: 254 10*3/uL (ref 150.0–400.0)
RBC: 4.57 Mil/uL (ref 3.87–5.11)
RDW: 13.1 % (ref 11.5–15.5)
WBC: 8 10*3/uL (ref 4.0–10.5)

## 2023-04-06 LAB — COMPREHENSIVE METABOLIC PANEL
ALT: 11 U/L (ref 0–35)
AST: 18 U/L (ref 0–37)
Albumin: 4.3 g/dL (ref 3.5–5.2)
Alkaline Phosphatase: 37 U/L — ABNORMAL LOW (ref 39–117)
BUN: 17 mg/dL (ref 6–23)
CO2: 34 meq/L — ABNORMAL HIGH (ref 19–32)
Calcium: 10.4 mg/dL (ref 8.4–10.5)
Chloride: 99 meq/L (ref 96–112)
Creatinine, Ser: 0.68 mg/dL (ref 0.40–1.20)
GFR: 80.2 mL/min (ref >60.00)
Glucose, Bld: 87 mg/dL (ref 70–99)
Potassium: 3.7 meq/L (ref 3.5–5.1)
Sodium: 141 meq/L (ref 135–145)
Total Bilirubin: 1.7 mg/dL — ABNORMAL HIGH (ref 0.2–1.2)
Total Protein: 7.6 g/dL (ref 6.0–8.3)

## 2023-04-06 LAB — LIPID PANEL
Cholesterol: 210 mg/dL — ABNORMAL HIGH (ref 0–200)
HDL: 57.6 mg/dL (ref >39.00)
LDL Cholesterol: 114 mg/dL — ABNORMAL HIGH (ref 0–99)
NonHDL: 152.46
Total CHOL/HDL Ratio: 4
Triglycerides: 191 mg/dL — ABNORMAL HIGH (ref 0.0–149.0)
VLDL: 38.2 mg/dL (ref 0.0–40.0)

## 2023-04-06 LAB — VITAMIN D 25 HYDROXY (VIT D DEFICIENCY, FRACTURES): VITD: 47.4 ng/mL (ref 30.00–100.00)

## 2023-04-06 LAB — HEMOGLOBIN A1C: Hgb A1c MFr Bld: 5.8 % (ref 4.6–6.5)

## 2023-04-06 LAB — TSH: TSH: 2.18 u[IU]/mL (ref 0.35–5.50)

## 2023-04-06 MED ORDER — COMIRNATY 30 MCG/0.3ML IM SUSY
0.3000 mL | PREFILLED_SYRINGE | Freq: Once | INTRAMUSCULAR | 0 refills | Status: AC
  Filled 2023-04-06: qty 0.3, 1d supply, fill #0

## 2023-04-06 MED ORDER — PRAVASTATIN SODIUM 40 MG PO TABS: 40.0000 mg | ORAL_TABLET | Freq: Every day | ORAL | 2 refills | Status: DC

## 2023-04-06 MED ORDER — LEVOTHYROXINE SODIUM 50 MCG PO TABS: ORAL_TABLET | ORAL | Status: DC

## 2023-04-06 NOTE — Assessment & Plan Note (Signed)
Chronic Started Prolia 07/2015-stopped 2024 secondary to multiple fractures while on Prolia Was on Tymlos for several months-given samples by Dr. Georga Kaufmann Reclast 12/2022 Did have compression fractures while on Prolia-saw Dr. Cleophas Dunker and she feels it was likely related to being on steroids for PMR for a year-advised switching from Prolia to Reclast Continue Reclast yearly-given 12/31/2022 Continue calcium and vitamin D Check vitamin D level

## 2023-04-06 NOTE — Assessment & Plan Note (Signed)
Chronic GERD controlled Continue famotidine 20 mg daily - twice daily prn

## 2023-04-06 NOTE — Assessment & Plan Note (Signed)
Chronic  Clinically euthyroid Check tsh and will titrate med dose if needed Currently taking levothyroxine 50 mcg daily 

## 2023-04-06 NOTE — Assessment & Plan Note (Signed)
Chronic Blood pressure controlled CMP Continue bisoprolol-HCTZ 5-6.25 mg daily

## 2023-04-06 NOTE — Assessment & Plan Note (Signed)
History of PMR Not convinced her leg syndromes are consistent with PMR-may be related to her compression fractures Can consider PT Can discuss with Dr. Cleophas Dunker or see Dr. Dimple Casey if needed

## 2023-04-06 NOTE — Assessment & Plan Note (Signed)
Chronic Regular exercise and healthy diet encouraged Check lipid panel  Continue pravastatin 40 mg daily 

## 2023-04-06 NOTE — Assessment & Plan Note (Signed)
Chronic Lab Results  Component Value Date   LDLCALC 84 10/01/2022   Continue pravastatin 40 mg daily

## 2023-04-06 NOTE — Assessment & Plan Note (Signed)
Chronic Check a1c Low sugar / carb diet Stressed regular exercise  

## 2023-04-14 ENCOUNTER — Other Ambulatory Visit: Payer: Self-pay | Admitting: Internal Medicine

## 2023-04-20 ENCOUNTER — Other Ambulatory Visit (HOSPITAL_BASED_OUTPATIENT_CLINIC_OR_DEPARTMENT_OTHER): Payer: Self-pay

## 2023-04-20 MED ORDER — INFLUENZA VAC A&B SURF ANT ADJ 0.5 ML IM SUSY
0.5000 mL | PREFILLED_SYRINGE | Freq: Once | INTRAMUSCULAR | 0 refills | Status: AC
  Filled 2023-04-20: qty 0.5, 1d supply, fill #0

## 2023-07-17 ENCOUNTER — Other Ambulatory Visit: Payer: Self-pay | Admitting: Obstetrics and Gynecology

## 2023-07-17 DIAGNOSIS — Z1231 Encounter for screening mammogram for malignant neoplasm of breast: Secondary | ICD-10-CM

## 2023-07-28 ENCOUNTER — Ambulatory Visit
Admission: RE | Admit: 2023-07-28 | Discharge: 2023-07-28 | Disposition: A | Discharge status: Home / Self Care | Payer: PPO | Source: Ambulatory Visit | Attending: Obstetrics and Gynecology | Admitting: Obstetrics and Gynecology

## 2023-07-28 DIAGNOSIS — Z1231 Encounter for screening mammogram for malignant neoplasm of breast: Secondary | ICD-10-CM | POA: Diagnosis not present

## 2023-08-20 ENCOUNTER — Ambulatory Visit: Payer: PPO

## 2023-08-20 VITALS — Ht <= 58 in | Wt 126.0 lb

## 2023-08-20 DIAGNOSIS — Z0000 Encounter for general adult medical examination without abnormal findings: Secondary | ICD-10-CM

## 2023-08-20 NOTE — Progress Notes (Signed)
Subjective:   [name] [name] is a 83 y.o. female who presents for Medicare Annual (Subsequent) preventive examination.  Visit Complete: Virtual I connected with  [name] [name] on 08/20/23 by a audio enabled telemedicine application and verified that I am speaking with the correct person using two identifiers.  Patient Location: Home  Provider Location: Office/Clinic  I discussed the limitations of evaluation and management by telemedicine. The patient expressed understanding and agreed to proceed.  Vital Signs: Because this visit was a virtual/telehealth visit, some criteria may be missing or patient reported. Any vitals not documented were not able to be obtained and vitals that have been documented are patient reported.    Cardiac Risk Factors include: advanced age (>7men, >28 women);hypertension;Other (see comment);dyslipidemia, Risk factor comments: Aortic atherosclerosis     Objective:    Today's Vitals   08/20/23 1429  Weight: 126 lb (57.2 kg)  Height: 4\' 10"  (1.473 m)   Body mass index is 26.33 kg/m.     08/20/2023    2:37 PM 08/18/2022    4:25 PM 05/14/2022    2:29 AM 05/12/2022    6:43 PM 09/06/2021    8:14 AM 08/16/2021    3:47 PM 08/08/2020   11:57 AM  Advanced Directives  Does Patient Have a Medical Advance Directive? Yes Yes No No Yes Yes No  Type of Estate agent of [place];Living will Healthcare Power of [place];Living will   Healthcare Power of [place];Living will Healthcare Power of [place];Living will   Does patient want to make changes to medical advance directive?      No - Patient declined   Copy of Healthcare Power of Attorney in Chart? No - copy requested No - copy requested    No - copy requested   Would patient like information on creating a medical advance directive?    No - Guardian declined;No - Patient declined   No - Patient declined    Current Medications (verified) Outpatient Encounter Medications as of 08/20/2023   Medication Sig   acetaminophen (TYLENOL) 650 MG CR tablet Take 650 mg by mouth every 8 (eight) hours as needed for pain.   Biotin 5 MG CAPS Taking daily   bisoprolol-hydrochlorothiazide (ZIAC) 5-6.25 MG tablet TAKE 1 TABLET BY MOUTH EVERY DAY   CALCIUM CITRATE-VITAMIN D PO Take 600 mg by mouth in the morning and at bedtime.   famotidine (PEPCID) 20 MG tablet Take 1 tablet (20 mg total) by mouth 2 (two) times daily as needed for heartburn or indigestion.   levothyroxine (SYNTHROID) 50 MCG tablet Take 1 tablet by mouth before breakfast 6 days a week   Multiple Vitamins-Minerals (CENTRUM SILVER PO) Take by mouth daily.   polyethylene glycol (MIRALAX / GLYCOLAX) 17 g packet Take 17 g by mouth daily. 2 capfuls daily   pravastatin (PRAVACHOL) 40 MG tablet Take 1 tablet (40 mg total) by mouth daily.   traMADol (ULTRAM) 50 MG tablet Take 1 tablet (50 mg total) by mouth every 12 (twelve) hours as needed for severe pain.   Wheat Dextrin (BENEFIBER) POWD Twice daily   denosumab (PROLIA) 60 MG/ML SOLN injection Inject 60 mg into the skin every 6 (six) months. Administer in upper arm, thigh, or abdomen (Patient not taking: Reported on 08/20/2023)   PROLIA 60 MG/ML SOSY injection INJECT 60 MG INTO THE SKIN ONCE FOR 1 DOSE (Patient not taking: Reported on 08/20/2023)   TYMLOS 3120 MCG/1.56ML SOPN SMARTSIG:1 Pre-Filled Pen Syringe SUB-Q Every Evening (Patient not taking:  Reported on 08/20/2023)   No facility-administered encounter medications on file as of 08/20/2023.    Allergies (verified) Patient has no known allergies.   History: Past Medical History:  Diagnosis Date   Benign neoplasm of colon    Cataract    bilateral,had surgery   DDD (degenerative disc disease), lumbar    MRI w. [facility] 05/2015, s/p ESI x 1 w/ improvement   Diaphragmatic hernia without mention of obstruction or gangrene    Diverticulosis of colon (without mention of hemorrhage)    DJD (degenerative joint disease) of knee     L>R, IA cortisone by [facility] in L 03/2015   Esophageal reflux    Gallstones    History of chicken pox    Hyperlipidemia    Hypertension    Irritable bowel syndrome    Migraines    Obesity, unspecified    Osteoporosis, unspecified    Tachycardia    Unspecified hemorrhoids with other complication    Past Surgical History:  Procedure Laterality Date   APPENDECTOMY  1979   CATARACT EXTRACTION     CHOLECYSTECTOMY  1989   COLONOSCOPY     IR KYPHO THORACIC WITH BONE BIOPSY  10/28/2022   IR KYPHO THORACIC WITH BONE BIOPSY  12/04/2022   IR RADIOLOGIST EVAL & MGMT  10/15/2022   IR RADIOLOGIST EVAL & MGMT  11/11/2022   IR RADIOLOGIST EVAL & MGMT  11/25/2022   POLYPECTOMY     VAGINAL HYSTERECTOMY  1996   Family History  Problem Relation Age of Onset   Stroke Mother    Dementia Mother    Stroke Father    Heart disease Sister        x 2   Colon cancer Sister    Stroke Sister 52   Heart disease Brother    Social History   Socioeconomic History   Marital status: Married    Spouse name: Iantha Fallen   Number of children: 2   Years of education: Not on file   Highest education level: Not on file  Occupational History   Occupation: retired  Tobacco Use   Smoking status: Never   Smokeless tobacco: Never  Vaping Use   Vaping status: Never Used  Substance and Sexual Activity   Alcohol use: No    Alcohol/week: 0.0 standard drinks of alcohol   Drug use: No   Sexual activity: Not Currently  Other Topics Concern   Not on file  Social History Narrative   Exercise: no regular exercise   One daughter in [place], one in [place]   Lives with husband-2025   Social Drivers of Health   Financial Resource Strain: Low Risk  (08/20/2023)   Overall Financial Resource Strain (CARDIA)    Difficulty of Paying Living Expenses: Not very hard  Food Insecurity: No Food Insecurity (08/20/2023)   Hunger Vital Sign    Worried About Running Out of Food in the Last Year: Never true    Ran Out of Food  in the Last Year: Never true  Transportation Needs: No Transportation Needs (08/20/2023)   PRAPARE - Administrator, Civil Service (Medical): No    Lack of Transportation (Non-Medical): No  Physical Activity: Inactive (08/20/2023)   Exercise Vital Sign    Days of Exercise per Week: 0 days    Minutes of Exercise per Session: 0 min  Stress: No Stress Concern Present (08/20/2023)   [employer] of Occupational Health - Occupational Stress Questionnaire    Feeling of  Stress : Not at all  Social Connections: Socially Integrated (08/20/2023)   Social Connection and Isolation Panel [NHANES]    Frequency of Communication with Friends and Family: Three times a week    Frequency of Social Gatherings with Friends and Family: Once a week    Attends Religious Services: More than 4 times per year    Active Member of [employer] or Organizations: Yes    Attends Banker Meetings: Never    Marital Status: Married    Tobacco Counseling Counseling given: Not Answered   Clinical Intake:  Pre-visit preparation completed: Yes  Pain : No/denies pain     BMI - recorded: 26.33 Nutritional Status: BMI 25 -29 Overweight Nutritional Risks: None Diabetes: No  How often do you need to have someone help you when you read instructions, pamphlets, or other written materials from your doctor or pharmacy?: 1 - Never     Information entered by :: Meagen Limones, RMA   Activities of Daily Living    08/20/2023    2:31 PM  In your present state of health, do you have any difficulty performing the following activities:  Hearing? 1  Comment some issues lately  Vision? 0  Difficulty concentrating or making decisions? 0  Walking or climbing stairs? 0  Dressing or bathing? 0  Doing errands, shopping? 0  Preparing Food and eating ? N  Using the Toilet? N  In the past six months, have you accidently leaked urine? N  Do you have problems with loss of bowel control? N  Managing your  Medications? N  Managing your Finances? N  Housekeeping or managing your Housekeeping? N    Patient Care Team: Pincus Sanes, MD as PCP - General (Internal Medicine) Richarda Overlie, MD (Obstetrics and Gynecology) Maris Berger, MD (Ophthalmology) Szabat, Vinnie Level, [facility] (Inactive) as Pharmacist (Pharmacist) Melina Fiddler, MD as Consulting Physician (Orthopedic Surgery) Cherlyn Roberts, MD as Referring Physician (Dermatology)  Indicate any recent Medical Services you may have received from other than [facility] providers in the past year (date may be approximate).     Assessment:   This is a routine wellness examination for [facility].  Hearing/Vision screen Hearing Screening - Comments:: Some issues-per pt Vision Screening - Comments:: Wears eyeglasses   Goals Addressed               This Visit's Progress     Patient Stated (pt-stated)        My goal is pain management for PMR. Wants to be able to do all her work independently   Back is feeling much better-2025      Depression Screen    08/20/2023    2:43 PM 08/18/2022    4:22 PM 06/03/2022   12:00 PM 05/22/2022   10:00 AM 04/02/2022    9:12 AM 08/16/2021    3:52 PM 08/08/2020   11:56 AM  PHQ 2/9 Scores  PHQ - 2 Score 0 0 3 0 0 0 0  PHQ- 9 Score 0  7 3 1       Fall Risk    08/20/2023    2:37 PM 08/18/2022    4:14 PM 06/03/2022   12:00 PM 05/22/2022   10:00 AM 04/02/2022    9:11 AM  Fall Risk   Falls in the past year? 0 0 0 0 0  Number falls in past yr: 0 0 0 0 0  Injury with Fall? 0 0 0 0 0  Risk for fall due to :  No Fall Risks Orthopedic patient No Fall Risks No Fall Risks No Fall Risks  Follow up Falls prevention discussed;Falls evaluation completed Falls prevention discussed;Education provided Falls evaluation completed Falls evaluation completed Falls evaluation completed    MEDICARE RISK AT HOME: Medicare Risk at Home Any stairs in or around the home?: Yes If so, are there any without handrails?:  Yes Home free of loose throw rugs in walkways, pet beds, electrical cords, etc?: Yes Adequate lighting in your home to reduce risk of falls?: Yes Life alert?: No Use of a cane, walker or w/c?: No Grab bars in the bathroom?: Yes Shower chair or bench in shower?: Yes Elevated toilet seat or a handicapped toilet?: Yes  TIMED UP AND GO:  Was the test performed?  No    Cognitive Function:    12/01/2017    1:35 PM 11/26/2016    9:35 AM  MMSE - Mini Mental State Exam  Orientation to time 5 5  Orientation to Place 5 5  Registration 3 3  Attention/ Calculation 4 5  Recall 3 2  Language- name 2 objects 2 2  Language- repeat 1 1  Language- follow 3 step command 3 3  Language- read & follow direction 1 1  Write a sentence 1 1  Copy design 1 1  Total score 29 29        08/20/2023    2:39 PM 08/18/2022    4:28 PM  6CIT Screen  What Year? 0 points 0 points  What month? 0 points 0 points  What time? 0 points 0 points  Count back from 20 0 points 0 points  Months in reverse 0 points 0 points  Repeat phrase 0 points 2 points  Total Score 0 points 2 points    Immunizations Immunization History  Administered Date(s) Administered   Fluad Quad(high Dose 65+) 05/12/2019, 06/11/2020, 07/03/2021, 05/01/2022   Fluad Trivalent(High Dose 65+) 04/20/2023   Influenza Split 05/28/2011, 06/23/2012   Influenza Whole 05/25/2007, 05/31/2010   Influenza, High Dose Seasonal PF 06/27/2015, 04/30/2016, 05/01/2017, 05/04/2018   Influenza,inj,Quad PF,6+ Mos 04/20/2013, 06/13/2014   Moderna SARS-COV2 Booster Vaccination 07/05/2020, 12/12/2020   Moderna Sars-Covid-2 Vaccination 08/08/2019, 09/05/2019   Pfizer(Comirnaty)Fall Seasonal Vaccine 12 years and older 04/06/2023   Pneumococcal Conjugate-13 10/31/2014   Pneumococcal Polysaccharide-23 10/07/2013   Td 10/07/2013   Zoster Recombinant(Shingrix) 09/03/2021, 11/12/2021   Zoster, Live 10/31/2014    TDAP status: Up to date  Flu Vaccine status: Up  to date  Pneumococcal vaccine status: Up to date  Covid-19 vaccine status: Completed vaccines  Qualifies for Shingles Vaccine? Yes   Zostavax completed Yes   Shingrix Completed?: Yes  Screening Tests Health Maintenance  Topic Date Due   DTaP/Tdap/Td (2 - Tdap) 10/08/2023   DEXA SCAN  04/03/2024   MAMMOGRAM  07/27/2024   Medicare Annual Wellness (AWV)  08/19/2024   Pneumonia Vaccine 26+ Years old  Completed   INFLUENZA VACCINE  Completed   COVID-19 Vaccine  Completed   Zoster Vaccines- Shingrix  Completed   HPV VACCINES  Aged Out   Colonoscopy  Discontinued    Health Maintenance  There are no preventive care reminders to display for this patient.   Colorectal cancer screening: No longer required.   Mammogram status: Completed 07/28/2023. Repeat every year  Bone Density status: Completed 04/03/2022. Results reflect: Bone density results: OSTEOPOROSIS. Repeat every 2 years.  Lung Cancer Screening: (Low Dose CT Chest recommended if Age 34-80 years, 20 pack-year currently smoking OR have quit w/in  15years.) does not qualify.   Lung Cancer Screening Referral: N/A  Additional Screening:  Hepatitis C Screening: does not qualify;  Vision Screening: Recommended annual ophthalmology exams for early detection of glaucoma and other disorders of the eye. Is the patient up to date with their annual eye exam?  Yes  Who is the provider or what is the name of the office in which the patient attends annual eye exams? [facility] If pt is not established with a provider, would they like to be referred to a provider to establish care? No .   Dental Screening: Recommended annual dental exams for proper oral hygiene   Community Resource Referral / Chronic Care Management: CRR required this visit?  No   CCM required this visit?  No     Plan:     I have personally reviewed and noted the following in the patient's chart:   Medical and social history Use of alcohol, tobacco or illicit  drugs  Current medications and supplements including opioid prescriptions. Patient is not currently taking opioid prescriptions. Functional ability and status Nutritional status Physical activity Advanced directives List of other physicians Hospitalizations, surgeries, and ER visits in previous 12 months Vitals Screenings to include cognitive, depression, and falls Referrals and appointments  In addition, I have reviewed and discussed with patient certain preventive protocols, quality metrics, and best practice recommendations. A written personalized care plan for preventive services as well as general preventive health recommendations were provided to patient.     Galvin Aversa L Laniesha Das, CMA   08/20/2023   After Visit Summary: (Mail) Due to this being a telephonic visit, the after visit summary with patients personalized plan was offered to patient via mail   Nurse Notes: Patient will be due for a DEXA in September 2025.  She is up to date on all r health maintenance with no concerns to address today.

## 2023-08-20 NOTE — Patient Instructions (Signed)
Ms. [name] [name] , Thank you for taking time to come for your Medicare Wellness Visit. I appreciate your ongoing commitment to your health goals. Please review the following plan we discussed and let me know if I can assist you in the future.   Referrals/Orders/Follow-Ups/Clinician Recommendations: It was nice talking to you today.  You will soon be due for a Bone density screening in September 2025.  Each day, aim for 6 glasses of water, plenty of protein in your diet and try to get up and walk/ stretch every hour for 5-10 minutes at a time.    This is a list of the screening recommended for you and due dates:  Health Maintenance  Topic Date Due   DTaP/Tdap/Td vaccine (2 - Tdap) 10/08/2023   DEXA scan (bone density measurement)  04/03/2024   Mammogram  07/27/2024   Medicare Annual Wellness Visit  08/19/2024   Pneumonia Vaccine  Completed   Flu Shot  Completed   COVID-19 Vaccine  Completed   Zoster (Shingles) Vaccine  Completed   HPV Vaccine  Aged Out   Colon Cancer Screening  Discontinued    Advanced directives: (Copy Requested) Please bring a copy of your health care power of attorney and living will to the office to be added to your chart at your convenience.  Next Medicare Annual Wellness Visit scheduled for next year: Yes

## 2023-08-25 DIAGNOSIS — M810 Age-related osteoporosis without current pathological fracture: Secondary | ICD-10-CM | POA: Diagnosis not present

## 2023-09-10 DIAGNOSIS — Z6827 Body mass index (BMI) 27.0-27.9, adult: Secondary | ICD-10-CM | POA: Diagnosis not present

## 2023-09-10 DIAGNOSIS — Z01419 Encounter for gynecological examination (general) (routine) without abnormal findings: Secondary | ICD-10-CM | POA: Diagnosis not present

## 2023-09-30 ENCOUNTER — Other Ambulatory Visit: Payer: Self-pay | Admitting: Internal Medicine

## 2023-10-04 NOTE — Progress Notes (Unsigned)
 Subjective:    Patient ID: [name] [name], female    DOB: 01-30-1941, 83 y.o.   MRN: [id number]     HPI Nathifa is here for follow up of her chronic medical problems.  Needs labs - for Dr Cleophas Dunker.  To have Reclast No. 2 in June.  Denies back pain.  Her legs hurts when she first gets up to walk - improves as she continues to walk - pain resolves.    No exercise.    Medications and allergies reviewed with patient and updated if appropriate.  Current Outpatient Medications on File Prior to Visit  Medication Sig Dispense Refill   acetaminophen (TYLENOL) 650 MG CR tablet Take 650 mg by mouth every 8 (eight) hours as needed for pain.     Biotin 5 MG CAPS Taking daily  0   bisoprolol-hydrochlorothiazide (ZIAC) 5-6.25 MG tablet TAKE 1 TABLET BY MOUTH EVERY DAY 90 tablet 1   CALCIUM CITRATE-VITAMIN D PO Take 600 mg by mouth in the morning and at bedtime.     famotidine (PEPCID) 20 MG tablet Take 1 tablet (20 mg total) by mouth 2 (two) times daily as needed for heartburn or indigestion. 180 tablet 3   levothyroxine (SYNTHROID) 50 MCG tablet Take 1 tablet by mouth before breakfast 6 days a week     Multiple Vitamins-Minerals (CENTRUM SILVER PO) Take by mouth daily.     polyethylene glycol (MIRALAX / GLYCOLAX) 17 g packet Take 17 g by mouth daily. 2 capfuls daily 14 each 0   pravastatin (PRAVACHOL) 40 MG tablet Take 1 tablet (40 mg total) by mouth daily. 90 tablet 2   traMADol (ULTRAM) 50 MG tablet Take 1 tablet (50 mg total) by mouth every 12 (twelve) hours as needed for severe pain. 10 tablet 0   Wheat Dextrin (BENEFIBER) POWD Twice daily  0   No current facility-administered medications on file prior to visit.     Review of Systems  Constitutional:  Negative for fever.  HENT:  Negative for trouble swallowing.   Respiratory:  Positive for cough (dry cough intermittent  - allergy ?). Negative for shortness of breath and wheezing.   Cardiovascular:  Negative for chest pain, palpitations  and leg swelling.  Gastrointestinal:        No gerd  Neurological:  Negative for light-headedness and headaches.       Objective:   Vitals:   10/05/23 0956  BP: 132/70  Pulse: 65  Temp: 97.6 F (36.4 C)  SpO2: 97%   BP Readings from Last 3 Encounters:  10/05/23 132/70  04/06/23 136/70  12/31/22 (!) 145/78   Wt Readings from Last 3 Encounters:  10/05/23 128 lb (58.1 kg)  08/20/23 126 lb (57.2 kg)  04/06/23 126 lb 9.6 oz (57.4 kg)   Body mass index is 26.75 kg/m.    Physical Exam Constitutional:      General: She is not in acute distress.    Appearance: Normal appearance.  HENT:     Head: Normocephalic and atraumatic.  Eyes:     Conjunctiva/sclera: Conjunctivae normal.  Cardiovascular:     Rate and Rhythm: Normal rate and regular rhythm.     Heart sounds: Murmur (2/6 sys) heard.  Pulmonary:     Effort: Pulmonary effort is normal. No respiratory distress.     Breath sounds: Normal breath sounds. No wheezing.  Musculoskeletal:     Cervical back: Neck supple.     Right lower leg: No edema.  Left lower leg: No edema.  Lymphadenopathy:     Cervical: No cervical adenopathy.  Skin:    General: Skin is warm and dry.     Findings: No rash.  Neurological:     Mental Status: She is alert. Mental status is at baseline.  Psychiatric:        Mood and Affect: Mood normal.        Behavior: Behavior normal.        Lab Results  Component Value Date   WBC 8.0 04/06/2023   HGB 13.7 04/06/2023   HCT 41.2 04/06/2023   PLT 254.0 04/06/2023   GLUCOSE 87 04/06/2023   CHOL 210 (H) 04/06/2023   TRIG 191.0 (H) 04/06/2023   HDL 57.60 04/06/2023   LDLDIRECT 158.0 06/27/2015   LDLCALC 114 (H) 04/06/2023   ALT 11 04/06/2023   AST 18 04/06/2023   NA 141 04/06/2023   K 3.7 04/06/2023   CL 99 04/06/2023   CREATININE 0.68 04/06/2023   BUN 17 04/06/2023   CO2 34 (H) 04/06/2023   TSH 2.18 04/06/2023   HGBA1C 5.8 04/06/2023     Assessment & Plan:    See Problem  List for Assessment and Plan of chronic medical problems.

## 2023-10-04 NOTE — Patient Instructions (Addendum)
      Blood work was ordered.       Medications changes include :   None    A referral was ordered and someone will call you to schedule an appointment.     Return in about 6 months (around 04/06/2024) for Physical Exam.

## 2023-10-05 ENCOUNTER — Encounter: Payer: Self-pay | Admitting: Internal Medicine

## 2023-10-05 ENCOUNTER — Ambulatory Visit (INDEPENDENT_AMBULATORY_CARE_PROVIDER_SITE_OTHER): Payer: PPO | Admitting: Internal Medicine

## 2023-10-05 VITALS — BP 132/70 | HR 65 | Temp 97.6°F | Ht <= 58 in | Wt 128.0 lb

## 2023-10-05 DIAGNOSIS — E7849 Other hyperlipidemia: Secondary | ICD-10-CM

## 2023-10-05 DIAGNOSIS — M810 Age-related osteoporosis without current pathological fracture: Secondary | ICD-10-CM

## 2023-10-05 DIAGNOSIS — K219 Gastro-esophageal reflux disease without esophagitis: Secondary | ICD-10-CM | POA: Diagnosis not present

## 2023-10-05 DIAGNOSIS — E063 Autoimmune thyroiditis: Secondary | ICD-10-CM | POA: Diagnosis not present

## 2023-10-05 DIAGNOSIS — R7303 Prediabetes: Secondary | ICD-10-CM

## 2023-10-05 DIAGNOSIS — I10 Essential (primary) hypertension: Secondary | ICD-10-CM | POA: Diagnosis not present

## 2023-10-05 LAB — CBC WITH DIFFERENTIAL/PLATELET
Basophils Absolute: 0 10*3/uL (ref 0.0–0.1)
Basophils Relative: 0.8 % (ref 0.0–3.0)
Eosinophils Absolute: 0.1 10*3/uL (ref 0.0–0.7)
Eosinophils Relative: 1 % (ref 0.0–5.0)
HCT: 41.2 % (ref 36.0–46.0)
Hemoglobin: 13.5 g/dL (ref 12.0–15.0)
Lymphocytes Relative: 28.1 % (ref 12.0–46.0)
Lymphs Abs: 1.4 10*3/uL (ref 0.7–4.0)
MCHC: 32.9 g/dL (ref 30.0–36.0)
MCV: 92.2 fl (ref 78.0–100.0)
Monocytes Absolute: 1 10*3/uL (ref 0.1–1.0)
Monocytes Relative: 20.2 % — ABNORMAL HIGH (ref 3.0–12.0)
Neutro Abs: 2.6 10*3/uL (ref 1.4–7.7)
Neutrophils Relative %: 49.9 % (ref 43.0–77.0)
Platelets: 202 10*3/uL (ref 150.0–400.0)
RBC: 4.47 Mil/uL (ref 3.87–5.11)
RDW: 13.6 % (ref 11.5–15.5)
WBC: 5.1 10*3/uL (ref 4.0–10.5)

## 2023-10-05 LAB — COMPREHENSIVE METABOLIC PANEL
ALT: 13 U/L (ref 0–35)
AST: 21 U/L (ref 0–37)
Albumin: 4.3 g/dL (ref 3.5–5.2)
Alkaline Phosphatase: 38 U/L — ABNORMAL LOW (ref 39–117)
BUN: 17 mg/dL (ref 6–23)
CO2: 30 meq/L (ref 19–32)
Calcium: 9.8 mg/dL (ref 8.4–10.5)
Chloride: 102 meq/L (ref 96–112)
Creatinine, Ser: 0.64 mg/dL (ref 0.40–1.20)
GFR: 81.1 mL/min (ref >60.00)
Glucose, Bld: 102 mg/dL — ABNORMAL HIGH (ref 70–99)
Potassium: 4 meq/L (ref 3.5–5.1)
Sodium: 138 meq/L (ref 135–145)
Total Bilirubin: 0.8 mg/dL (ref 0.2–1.2)
Total Protein: 7.1 g/dL (ref 6.0–8.3)

## 2023-10-05 LAB — VITAMIN D 25 HYDROXY (VIT D DEFICIENCY, FRACTURES): VITD: 49.3 ng/mL (ref 30.00–100.00)

## 2023-10-05 LAB — TSH: TSH: 1.64 u[IU]/mL (ref 0.35–5.50)

## 2023-10-05 LAB — LIPID PANEL
Cholesterol: 193 mg/dL (ref 0–200)
HDL: 55.6 mg/dL (ref >39.00)
LDL Cholesterol: 109 mg/dL — ABNORMAL HIGH (ref 0–99)
NonHDL: 137.44
Total CHOL/HDL Ratio: 3
Triglycerides: 142 mg/dL (ref 0.0–149.0)
VLDL: 28.4 mg/dL (ref 0.0–40.0)

## 2023-10-05 LAB — HEMOGLOBIN A1C: Hgb A1c MFr Bld: 5.7 % (ref 4.6–6.5)

## 2023-10-05 NOTE — Assessment & Plan Note (Signed)
Chronic GERD controlled Continue famotidine 20 mg daily - twice daily prn

## 2023-10-05 NOTE — Assessment & Plan Note (Signed)
Chronic Blood pressure controlled CMP Continue bisoprolol-HCTZ 5-6.25 mg daily

## 2023-10-05 NOTE — Assessment & Plan Note (Signed)
 Chronic Lab Results  Component Value Date   HGBA1C 5.8 04/06/2023   Check a1c Low sugar / carb diet Stressed regular exercise

## 2023-10-05 NOTE — Assessment & Plan Note (Signed)
 Chronic  Clinically euthyroid Check tsh and will titrate med dose if needed Currently taking levothyroxine 50 mcg daily 6 days a week

## 2023-10-05 NOTE — Assessment & Plan Note (Addendum)
 Chronic Management per Dr. Cleophas Dunker History of compression fractures Started Prolia 07/2015-stopped 2024 secondary to multiple fractures while on Prolia (likely related to being on steroids for PMR) Was on Tymlos for 9 months-given samples by Dr. Georga Kaufmann Reclast 12/2022 Did have compression fractures while on Prolia-saw Dr.  Berneda Rose Reclast yearly-given 12/31/2022 - Dr Cleophas Dunker Dexa due in Sept '25 Continue calcium and vitamin D Check vitamin D level, PTH, calcium

## 2023-10-05 NOTE — Assessment & Plan Note (Signed)
Chronic Regular exercise and healthy diet encouraged Check lipid panel  Continue pravastatin 40 mg daily 

## 2023-10-07 LAB — PTH, INTACT AND CALCIUM
Calcium: 9.9 mg/dL (ref 8.6–10.4)
PTH: 19 pg/mL (ref 16–77)

## 2023-10-09 ENCOUNTER — Encounter: Payer: Self-pay | Admitting: Family

## 2023-10-09 ENCOUNTER — Ambulatory Visit: Admitting: Family

## 2023-10-09 ENCOUNTER — Ambulatory Visit: Payer: Self-pay | Admitting: Internal Medicine

## 2023-10-09 VITALS — BP 126/64 | HR 58 | Temp 98.2°F | Ht <= 58 in | Wt 130.4 lb

## 2023-10-09 DIAGNOSIS — J069 Acute upper respiratory infection, unspecified: Secondary | ICD-10-CM | POA: Diagnosis not present

## 2023-10-09 DIAGNOSIS — R051 Acute cough: Secondary | ICD-10-CM | POA: Diagnosis not present

## 2023-10-09 LAB — POCT INFLUENZA A/B
Influenza A, POC: NEGATIVE
Influenza B, POC: NEGATIVE

## 2023-10-09 LAB — POC COVID19 BINAXNOW: SARS Coronavirus 2 Ag: NEGATIVE

## 2023-10-09 MED ORDER — BENZONATATE 100 MG PO CAPS: 100.0000 mg | ORAL_CAPSULE | Freq: Three times a day (TID) | ORAL | 0 refills | Status: AC | PRN

## 2023-10-09 MED ORDER — METHYLPREDNISOLONE 4 MG PO TBPK: ORAL_TABLET | ORAL | 0 refills | Status: AC

## 2023-10-09 NOTE — Telephone Encounter (Signed)
 Copied from CRM [id number]. Topic: Clinical - Red Word Triage >> Oct 09, 2023 12:31 PM Eunice Blase wrote: Red Word that prompted transfer to Nurse Triage: Pt has coughing, congestion, fever. Reason for Disposition  [1] Continuous (nonstop) coughing interferes with work or school AND [2] no improvement using cough treatment per Care Advice  Answer Assessment - Initial Assessment Questions 1. ONSET: "When did the cough begin?"      I'm coughing, having fever and congestion with hoarseness.   I'm taking Tylenol.   My fever was 99 after the Tylenol.   I'm not feeling good but not really bad.    A couple of days ago my temp was normal. This all started with a dry cough on Tues. 2. SEVERITY: "How bad is the cough today?"      The cough is getting worse and hoarseness. 3. SPUTUM: "Describe the color of your sputum" (none, dry cough; clear, white, yellow, green)     Mostly clear but at times darker. 4. HEMOPTYSIS: "Are you coughing up any blood?" If so ask: "How much?" (flecks, streaks, tablespoons, etc.)     Not asked 5. DIFFICULTY BREATHING: "Are you having difficulty breathing?" If Yes, ask: "How bad is it?" (e.g., mild, moderate, severe)    - MILD: No SOB at rest, mild SOB with walking, speaks normally in sentences, can lie down, no retractions, pulse < 100.    - MODERATE: SOB at rest, SOB with minimal exertion and prefers to sit, cannot lie down flat, speaks in phrases, mild retractions, audible wheezing, pulse 100-120.    - SEVERE: Very SOB at rest, speaks in single words, struggling to breathe, sitting hunched forward, retractions, pulse > 120      No tightness or shortness of breath  It burns in my chest when I cough.  I saw Dr. Lawerance Bach on Monday.   I was ok then.    I forgot to ask her about a covid shot.    6. FEVER: "Do you have a fever?" If Yes, ask: "What is your temperature, how was it measured, and when did it start?"     Yes 7. CARDIAC HISTORY: "Do you have any history of heart disease?"  (e.g., heart attack, congestive heart failure)      No 8. LUNG HISTORY: "Do you have any history of lung disease?"  (e.g., pulmonary embolus, asthma, emphysema)     No 9. PE RISK FACTORS: "Do you have a history of blood clots?" (or: recent major surgery, recent prolonged travel, bedridden)     age 83. OTHER SYMPTOMS: "Do you have any other symptoms?" (e.g., runny nose, wheezing, chest pain)       See above 11. PREGNANCY: "Is there any chance you are pregnant?" "When was your last menstrual period?"       N/A 12. TRAVEL: "Have you traveled out of the country in the last month?" (e.g., travel history, exposures)       N/A  Protocols used: Cough - Acute Non-Productive-A-AH  Chief Complaint: coughing, fever, body aches Symptoms: congestion, hoarseness Frequency: Since Tues. Pertinent Negatives: Patient denies chest tightness or shortness of breath Disposition: [] ED /[] Urgent Care (no appt availability in office) / [x] Appointment(In office/virtual)/ []  [facility]/ [] Home Care/ [] Refused Recommended Disposition /[] [facility]/ []  Follow-up with PCP Additional Notes: I called into the practice and they got her scheduled with Worthy Rancher, NP for today at 2:45.   Pt. Agreeable to this time.

## 2023-10-09 NOTE — Progress Notes (Signed)
 Acute Office Visit  Subjective:     Patient ID: [name] [name], female    DOB: 06/26/1941, 83 y.o.   MRN: [id number]  Chief Complaint  Patient presents with  . Fever  . Chills  . bodyaches    HPI Patient is in today with complaints of fever, chills, body aches, dry cough and hoarseness x 3 days.  Has not been taking any medication over-the-counter.  Denies any known sick contacts.  Review of Systems  Constitutional:  Positive for chills and fever.  HENT:  Positive for congestion.   Respiratory:  Positive for cough. Negative for shortness of breath and wheezing.   Cardiovascular: Negative.   Musculoskeletal:  Positive for myalgias.  Neurological: Negative.   Psychiatric/Behavioral: Negative.    All other systems reviewed and are negative. Past Medical History:  Diagnosis Date  . Benign neoplasm of colon   . Cataract    bilateral,had surgery  . DDD (degenerative disc disease), lumbar    MRI w. [facility] 05/2015, s/p ESI x 1 w/ improvement  . Diaphragmatic hernia without mention of obstruction or gangrene   . Diverticulosis of colon (without mention of hemorrhage)   . DJD (degenerative joint disease) of knee    L>R, IA cortisone by [facility] in L 03/2015  . Esophageal reflux   . Gallstones   . History of chicken pox   . Hyperlipidemia   . Hypertension   . Irritable bowel syndrome   . Migraines   . Obesity, unspecified   . Osteoporosis, unspecified   . Tachycardia   . Unspecified hemorrhoids with other complication     Social History   Socioeconomic History  . Marital status: Married    Spouse name: Iantha Fallen  . Number of children: 2  . Years of education: Not on file  . Highest education level: Not on file  Occupational History  . Occupation: retired  Tobacco Use  . Smoking status: Never  . Smokeless tobacco: Never  Vaping Use  . Vaping status: Never Used  Substance and Sexual Activity  . Alcohol use: No    Alcohol/week: 0.0 standard drinks of alcohol  .  Drug use: No  . Sexual activity: Not Currently  Other Topics Concern  . Not on file  Social History Narrative   Exercise: no regular exercise   One daughter in [place], one in [place]   Lives with husband-2025   Social Drivers of Health   Financial Resource Strain: Low Risk  (08/20/2023)   Overall Financial Resource Strain (CARDIA)   . Difficulty of Paying Living Expenses: Not very hard  Food Insecurity: No Food Insecurity (08/20/2023)   Hunger Vital Sign   . Worried About Programme researcher, broadcasting/film/video in the Last Year: Never true   . Ran Out of Food in the Last Year: Never true  Transportation Needs: No Transportation Needs (08/20/2023)   PRAPARE - Transportation   . Lack of Transportation (Medical): No   . Lack of Transportation (Non-Medical): No  Physical Activity: Inactive (08/20/2023)   Exercise Vital Sign   . Days of Exercise per Week: 0 days   . Minutes of Exercise per Session: 0 min  Stress: No Stress Concern Present (08/20/2023)   [employer] of Occupational Health - Occupational Stress Questionnaire   . Feeling of Stress : Not at all  Social Connections: Socially Integrated (08/20/2023)   Social Connection and Isolation Panel [NHANES]   . Frequency of Communication with Friends and Family: Three times a  week   . Frequency of Social Gatherings with Friends and Family: Once a week   . Attends Religious Services: More than 4 times per year   . Active Member of Clubs or Organizations: Yes   . Attends Banker Meetings: Never   . Marital Status: Married  Catering manager Violence: Not At Risk (08/20/2023)   Humiliation, Afraid, Rape, and Kick questionnaire   . Fear of Current or Ex-Partner: No   . Emotionally Abused: No   . Physically Abused: No   . Sexually Abused: No    Past Surgical History:  Procedure Laterality Date  . APPENDECTOMY  1979  . CATARACT EXTRACTION    . CHOLECYSTECTOMY  1989  . COLONOSCOPY    . IR KYPHO THORACIC WITH BONE BIOPSY  10/28/2022   . IR KYPHO THORACIC WITH BONE BIOPSY  12/04/2022  . IR RADIOLOGIST EVAL & MGMT  10/15/2022  . IR RADIOLOGIST EVAL & MGMT  11/11/2022  . IR RADIOLOGIST EVAL & MGMT  11/25/2022  . POLYPECTOMY    . VAGINAL HYSTERECTOMY  1996    Family History  Problem Relation Age of Onset  . Stroke Mother   . Dementia Mother   . Stroke Father   . Heart disease Sister        x 2  . Colon cancer Sister   . Stroke Sister 73  . Heart disease Brother     No Known Allergies  Current Outpatient Medications on File Prior to Visit  Medication Sig Dispense Refill  . acetaminophen (TYLENOL) 650 MG CR tablet Take 650 mg by mouth every 8 (eight) hours as needed for pain.    . Biotin 5 MG CAPS Taking daily  0  . bisoprolol-hydrochlorothiazide (ZIAC) 5-6.25 MG tablet TAKE 1 TABLET BY MOUTH EVERY DAY 90 tablet 1  . CALCIUM CITRATE-VITAMIN D PO Take 600 mg by mouth daily.    . famotidine (PEPCID) 20 MG tablet Take 1 tablet (20 mg total) by mouth 2 (two) times daily as needed for heartburn or indigestion. 180 tablet 3  . levothyroxine (SYNTHROID) 50 MCG tablet Take 1 tablet by mouth before breakfast 6 days a week    . Multiple Vitamins-Minerals (CENTRUM SILVER PO) Take by mouth daily.    . polyethylene glycol (MIRALAX / GLYCOLAX) 17 g packet Take 17 g by mouth daily. 2 capfuls daily 14 each 0  . pravastatin (PRAVACHOL) 40 MG tablet Take 1 tablet (40 mg total) by mouth daily. 90 tablet 2  . traMADol (ULTRAM) 50 MG tablet Take 1 tablet (50 mg total) by mouth every 12 (twelve) hours as needed for severe pain. 10 tablet 0  . Wheat Dextrin (BENEFIBER) POWD Twice daily  0   No current facility-administered medications on file prior to visit.    BP 126/64 (BP Location: Left Arm, Patient Position: Sitting, Cuff Size: Small)   Pulse (!) 58   Temp 98.2 F (36.8 C) (Oral)   Ht 4\' 10"  (1.473 m)   Wt 130 lb 6.4 oz (59.1 kg)   SpO2 94%   BMI 27.25 kg/m chart      Objective:    BP 126/64 (BP Location: Left Arm, Patient  Position: Sitting, Cuff Size: Small)   Pulse (!) 58   Temp 98.2 F (36.8 C) (Oral)   Ht 4\' 10"  (1.473 m)   Wt 130 lb 6.4 oz (59.1 kg)   SpO2 94%   BMI 27.25 kg/m    Physical Exam Vitals and nursing note reviewed.  Constitutional:      Appearance: Normal appearance. She is normal weight.  HENT:     Right Ear: Tympanic membrane, ear canal and external ear normal.     Left Ear: Tympanic membrane, ear canal and external ear normal.     Nose: Congestion and rhinorrhea present.     Mouth/Throat:     Mouth: Mucous membranes are moist.  Cardiovascular:     Rate and Rhythm: Normal rate and regular rhythm.     Pulses: Normal pulses.     Heart sounds: Normal heart sounds.  Pulmonary:     Effort: Pulmonary effort is normal.     Breath sounds: Normal breath sounds.  Abdominal:     General: Abdomen is flat. Bowel sounds are normal.     Palpations: Abdomen is soft.  Musculoskeletal:        General: Normal range of motion.     Cervical back: Normal range of motion and neck supple.  Skin:    General: Skin is warm and dry.  Neurological:     General: No focal deficit present.     Mental Status: She is alert and oriented to person, place, and time.  Psychiatric:        Mood and Affect: Mood normal.        Behavior: Behavior normal.   Results for orders placed or performed in visit on 10/09/23  POCT Influenza A/B  Result Value Ref Range   Influenza A, POC Negative Negative   Influenza B, POC Negative Negative  POC COVID-19  Result Value Ref Range   SARS Coronavirus 2 Ag Negative Negative        Assessment & Plan:   Problem List Items Addressed This Visit   None Visit Diagnoses       Viral upper respiratory infection    -  Primary     Acute cough       Relevant Orders   POCT Influenza A/B (Completed)   POC COVID-19 (Completed)       Meds ordered this encounter  Medications  . methylPREDNISolone (MEDROL DOSEPAK) 4 MG TBPK tablet    Sig: As directed    Dispense:  21  tablet    Refill:  0  . benzonatate (TESSALON PERLES) 100 MG capsule    Sig: Take 1 capsule (100 mg total) by mouth 3 (three) times daily as needed for cough.    Dispense:  20 capsule    Refill:  0   Call the office if symptoms worsen or persist.  Recheck as scheduled and sooner as needed. No follow-ups on file.  Eulis Foster, FNP

## 2023-12-10 DIAGNOSIS — M810 Age-related osteoporosis without current pathological fracture: Secondary | ICD-10-CM | POA: Diagnosis not present

## 2023-12-10 DIAGNOSIS — E559 Vitamin D deficiency, unspecified: Secondary | ICD-10-CM | POA: Diagnosis not present

## 2023-12-10 DIAGNOSIS — R5383 Other fatigue: Secondary | ICD-10-CM | POA: Diagnosis not present

## 2023-12-14 ENCOUNTER — Other Ambulatory Visit: Payer: Self-pay | Admitting: Sports Medicine

## 2023-12-14 ENCOUNTER — Telehealth: Payer: Self-pay

## 2023-12-14 NOTE — Telephone Encounter (Signed)
 Auth Submission: NO AUTH NEEDED Site of care: Site of care: [facility] Payer: [employer] Medication & CPT/J Code(s) submitted: Reclast  (Zolendronic acid) W0981 Route of submission (phone, fax, portal):  Phone # Fax # Auth type: Buy/Bill PB Units/visits requested: 5mg  x 1 dose Reference number:  Approval from: 12/14/23 to 07/27/24

## 2023-12-28 DIAGNOSIS — H02834 Dermatochalasis of left upper eyelid: Secondary | ICD-10-CM | POA: Diagnosis not present

## 2023-12-28 DIAGNOSIS — H02831 Dermatochalasis of right upper eyelid: Secondary | ICD-10-CM | POA: Diagnosis not present

## 2023-12-28 DIAGNOSIS — H52203 Unspecified astigmatism, bilateral: Secondary | ICD-10-CM | POA: Diagnosis not present

## 2023-12-28 DIAGNOSIS — Z961 Presence of intraocular lens: Secondary | ICD-10-CM | POA: Diagnosis not present

## 2024-01-04 ENCOUNTER — Ambulatory Visit

## 2024-01-04 VITALS — BP 132/65 | HR 58 | Temp 98.0°F | Resp 16 | Ht 59.0 in | Wt 132.4 lb

## 2024-01-04 DIAGNOSIS — M810 Age-related osteoporosis without current pathological fracture: Secondary | ICD-10-CM | POA: Diagnosis not present

## 2024-01-04 MED ORDER — ACETAMINOPHEN 325 MG PO TABS: 650.0000 mg | ORAL_TABLET | Freq: Once | ORAL | Status: DC

## 2024-01-04 MED ORDER — DIPHENHYDRAMINE HCL 25 MG PO CAPS
25.0000 mg | ORAL_CAPSULE | Freq: Once | ORAL | Status: AC
  Administered 2024-01-04: 25 mg via ORAL
  Filled 2024-01-04: qty 1

## 2024-01-04 MED ORDER — SODIUM CHLORIDE 0.9 % IV SOLN: INTRAVENOUS | Status: DC

## 2024-01-04 MED ORDER — ZOLEDRONIC ACID 5 MG/100ML IV SOLN
5.0000 mg | Freq: Once | INTRAVENOUS | Status: AC
  Administered 2024-01-04: 5 mg via INTRAVENOUS
  Filled 2024-01-04: qty 100

## 2024-01-04 NOTE — Progress Notes (Signed)
 Diagnosis: Osteoporosis  Provider:  Phyllis Breeze MD  Procedure: IV Infusion  IV Type: Peripheral, IV Location: L Antecubital  Reclast  (Zolendronic Acid), Dose: 5 mg  Infusion Start Time: 1339  Infusion Stop Time: 1410  Post Infusion IV Care: Patient declined observation and Peripheral IV Discontinued  Discharge: Condition: Good, Destination: Home . AVS Declined  Performed by:  Star East, LPN

## 2024-01-11 ENCOUNTER — Other Ambulatory Visit: Payer: Self-pay | Admitting: Internal Medicine

## 2024-01-20 DIAGNOSIS — L814 Other melanin hyperpigmentation: Secondary | ICD-10-CM | POA: Diagnosis not present

## 2024-01-20 DIAGNOSIS — L720 Epidermal cyst: Secondary | ICD-10-CM | POA: Diagnosis not present

## 2024-01-20 DIAGNOSIS — D492 Neoplasm of unspecified behavior of bone, soft tissue, and skin: Secondary | ICD-10-CM | POA: Diagnosis not present

## 2024-01-20 DIAGNOSIS — L700 Acne vulgaris: Secondary | ICD-10-CM | POA: Diagnosis not present

## 2024-01-20 DIAGNOSIS — L821 Other seborrheic keratosis: Secondary | ICD-10-CM | POA: Diagnosis not present

## 2024-01-20 DIAGNOSIS — D225 Melanocytic nevi of trunk: Secondary | ICD-10-CM | POA: Diagnosis not present

## 2024-02-22 DIAGNOSIS — D492 Neoplasm of unspecified behavior of bone, soft tissue, and skin: Secondary | ICD-10-CM | POA: Diagnosis not present

## 2024-02-22 DIAGNOSIS — L538 Other specified erythematous conditions: Secondary | ICD-10-CM | POA: Diagnosis not present

## 2024-02-22 DIAGNOSIS — L821 Other seborrheic keratosis: Secondary | ICD-10-CM | POA: Diagnosis not present

## 2024-03-22 ENCOUNTER — Other Ambulatory Visit: Payer: Self-pay | Admitting: Internal Medicine

## 2024-03-22 DIAGNOSIS — I700 Atherosclerosis of aorta: Secondary | ICD-10-CM

## 2024-03-22 DIAGNOSIS — E7849 Other hyperlipidemia: Secondary | ICD-10-CM

## 2024-04-21 DIAGNOSIS — L299 Pruritus, unspecified: Secondary | ICD-10-CM | POA: Diagnosis not present

## 2024-04-21 DIAGNOSIS — H6123 Impacted cerumen, bilateral: Secondary | ICD-10-CM | POA: Diagnosis not present

## 2024-05-26 ENCOUNTER — Ambulatory Visit

## 2024-05-26 DIAGNOSIS — Z23 Encounter for immunization: Secondary | ICD-10-CM

## 2024-05-26 NOTE — Progress Notes (Signed)
 After obtaining consent, and per orders of Dr. Geofm, injection of HDFLU given by Ronnald SHAUNNA Palms. Patient instructed to remain in clinic for 20 minutes afterwards, and to report any adverse reaction to me immediately.

## 2024-06-27 ENCOUNTER — Other Ambulatory Visit: Payer: Self-pay | Admitting: Obstetrics and Gynecology

## 2024-06-27 DIAGNOSIS — Z1231 Encounter for screening mammogram for malignant neoplasm of breast: Secondary | ICD-10-CM

## 2024-08-01 ENCOUNTER — Ambulatory Visit
Admission: RE | Admit: 2024-08-01 | Discharge: 2024-08-01 | Disposition: A | Discharge status: Home / Self Care | Source: Ambulatory Visit | Attending: Obstetrics and Gynecology | Admitting: Obstetrics and Gynecology

## 2024-08-01 DIAGNOSIS — Z1231 Encounter for screening mammogram for malignant neoplasm of breast: Secondary | ICD-10-CM

## 2024-08-22 ENCOUNTER — Ambulatory Visit: Payer: PPO

## 2024-08-23 ENCOUNTER — Ambulatory Visit

## 2024-08-23 VITALS — Ht <= 58 in | Wt 132.0 lb

## 2024-08-23 DIAGNOSIS — M810 Age-related osteoporosis without current pathological fracture: Secondary | ICD-10-CM

## 2024-08-23 DIAGNOSIS — Z0000 Encounter for general adult medical examination without abnormal findings: Secondary | ICD-10-CM

## 2024-08-23 DIAGNOSIS — M8000XG Age-related osteoporosis with current pathological fracture, unspecified site, subsequent encounter for fracture with delayed healing: Secondary | ICD-10-CM

## 2024-08-23 NOTE — Patient Instructions (Addendum)
 Ms. Leyland,  Thank you for taking the time for your Medicare Wellness Visit. I appreciate your continued commitment to your health goals. Please review the care plan we discussed, and feel free to reach out if I can assist you further.  Please note that Annual Wellness Visits do not include a physical exam. Some assessments may be limited, especially if the visit was conducted virtually. If needed, we may recommend an in-person follow-up with your provider.  Ongoing Care Seeing your primary care provider every 3 to 6 months helps us  monitor your health and provide consistent, personalized care.   Referrals If a referral was made during today's visit and you haven't received any updates within two weeks, please contact the referred provider directly to check on the status.  Recommended Screenings:  Health Maintenance  Topic Date Due   DTaP/Tdap/Td vaccine (2 - Tdap) 10/08/2023   COVID-19 Vaccine (6 - 2025-26 season) 03/28/2024   Osteoporosis screening with Bone Density Scan  04/03/2024   Breast Cancer Screening  08/01/2025   Medicare Annual Wellness Visit  08/23/2025   Pneumococcal Vaccine for age over 77  Completed   Flu Shot  Completed   Zoster (Shingles) Vaccine  Completed   Meningitis B Vaccine  Aged Out   Colon Cancer Screening  Discontinued       08/23/2024    1:13 PM  Advanced Directives  Does Patient Have a Medical Advance Directive? Yes  Type of Estate Agent of [place];Living will  Does patient want to make changes to medical advance directive? Yes (Inpatient - patient requests chaplain consult to change a medical advance directive)  Copy of Healthcare Power of Attorney in Chart? No - copy requested    Vision: Annual vision screenings are recommended for early detection of glaucoma, cataracts, and diabetic retinopathy. These exams can also reveal signs of chronic conditions such as diabetes and high blood pressure.  Dental: Annual dental screenings  help detect early signs of oral cancer, gum disease, and other conditions linked to overall health, including heart disease and diabetes.

## 2024-08-23 NOTE — Progress Notes (Signed)
 "  Chief Complaint  Patient presents with   Medicare Wellness     Subjective:   [name] [name] is a 84 y.o. female who presents for a Medicare Annual Wellness Visit.  Visit info / Clinical Intake: Medicare Wellness Visit Type:: Subsequent Annual Wellness Visit Persons participating in visit and providing information:: patient Medicare Wellness Visit Mode:: Telephone If telephone:: video declined Since this visit was completed virtually, some vitals may be partially provided or unavailable. Missing vitals are due to the limitations of the virtual format.: Documented vitals are patient reported If Telephone or Video please confirm:: I connected with patient using audio/video enable telemedicine. I verified patient identity with two identifiers, discussed telehealth limitations, and patient agreed to proceed. Patient Location:: Home Provider Location:: Office Interpreter Needed?: No Pre-visit prep was completed: yes AWV questionnaire completed by patient prior to visit?: no Living arrangements:: lives with spouse/significant other Patient's Overall Health Status Rating: good Typical amount of pain: none Does pain affect daily life?: no Are you currently prescribed opioids?: (!) yes  Dietary Habits and Nutritional Risks How many meals a day?: 3 Eats fruit and vegetables daily?: yes Most meals are obtained by: preparing own meals In the last 2 weeks, have you had any of the following?: none Diabetic:: no  Functional Status Activities of Daily Living (to include ambulation/medication): Independent Ambulation: Independent with device- listed below Home Assistive Devices/Equipment: Eyeglasses Medication Administration: Independent Home Management (perform basic housework or laundry): Independent Manage your own finances?: yes Primary transportation is: driving Concerns about vision?: no *vision screening is required for WTM* Concerns about hearing?: (!) yes (plans to schedule an appt  to f/u w/Dr Jesus) Uses hearing aids?: no Hear whispered voice?: yes  Fall Screening Falls in the past year?: 0 Number of falls in past year: 0 Was there an injury with Fall?: 0 Fall Risk Category Calculator: 0 Patient Fall Risk Level: Low Fall Risk  Fall Risk Patient at Risk for Falls Due to: No Fall Risks Fall risk Follow up: Falls evaluation completed; Falls prevention discussed  Home and Transportation Safety: All rugs have non-skid backing?: N/A, no rugs All stairs or steps have railings?: yes Grab bars in the bathtub or shower?: (!) no Have non-skid surface in bathtub or shower?: yes Good home lighting?: yes Regular seat belt use?: yes Hospital stays in the last year:: no  Cognitive Assessment Difficulty concentrating, remembering, or making decisions? : no Will 6CIT or Mini Cog be Completed: yes What year is it?: 0 points What month is it?: 0 points Give patient an address phrase to remember (5 components): [address] [place], [place] About what time is it?: 0 points Count backwards from 20 to 1: 0 points Say the months of the year in reverse: 0 points Repeat the address phrase from earlier: 0 points 6 CIT Score: 0 points  Advance Directives (For Healthcare) Does Patient Have a Medical Advance Directive?: Yes Does patient want to make changes to medical advance directive?: Yes (Inpatient - patient requests chaplain consult to change a medical advance directive) Type of Advance Directive: Healthcare Power of [place]; Living will Copy of Healthcare Power of Attorney in Chart?: No - copy requested Copy of Living Will in Chart?: No - copy requested  Reviewed/Updated  Reviewed/Updated: Reviewed All (Medical, Surgical, Family, Medications, Allergies, Care Teams, Patient Goals)    Allergies (verified) Patient has no known allergies.   Current Medications (verified) Outpatient Encounter Medications as of 08/23/2024  Medication Sig   acetaminophen  (TYLENOL ) 650 MG  CR tablet Take 650 mg by mouth every 8 (eight) hours as needed for pain.   acetaminophen  (TYLENOL ) 650 MG CR tablet Take 650 mg by mouth every 8 (eight) hours as needed for pain.   benzonatate  (TESSALON  PERLES) 100 MG capsule Take 1 capsule (100 mg total) by mouth 3 (three) times daily as needed for cough.   Biotin  5 MG CAPS Taking daily   bisoprolol -hydrochlorothiazide  (ZIAC ) 5-6.25 MG tablet TAKE 1 TABLET BY MOUTH EVERY DAY   CALCIUM CITRATE-VITAMIN D  PO Take 600 mg by mouth daily.   famotidine  (PEPCID ) 20 MG tablet Take 1 tablet (20 mg total) by mouth 2 (two) times daily as needed for heartburn or indigestion.   levothyroxine  (SYNTHROID ) 50 MCG tablet TAKE 1 TABLET BY MOUTH EVERY DAY   methylPREDNISolone  (MEDROL  DOSEPAK) 4 MG TBPK tablet As directed   Multiple Vitamins-Minerals (CENTRUM SILVER PO) Take by mouth daily.   polyethylene glycol (MIRALAX  / GLYCOLAX ) 17 g packet Take 17 g by mouth daily. 2 capfuls daily   pravastatin  (PRAVACHOL ) 40 MG tablet TAKE 1 TABLET BY MOUTH EVERY DAY   traMADol  (ULTRAM ) 50 MG tablet Take 1 tablet (50 mg total) by mouth every 12 (twelve) hours as needed for severe pain.   Wheat Dextrin (BENEFIBER) POWD Twice daily   No facility-administered encounter medications on file as of 08/23/2024.    History: Past Medical History:  Diagnosis Date   Benign neoplasm of colon    Cataract    bilateral,had surgery   DDD (degenerative disc disease), lumbar    MRI w. [facility] 05/2015, s/p ESI x 1 w/ improvement   Diaphragmatic hernia without mention of obstruction or gangrene    Diverticulosis of colon (without mention of hemorrhage)    DJD (degenerative joint disease) of knee    L>R, IA cortisone by [facility] in L 03/2015   Esophageal reflux    Gallstones    History of chicken pox    Hyperlipidemia    Hypertension    Irritable bowel syndrome    Migraines    Obesity, unspecified    Osteoporosis, unspecified    Tachycardia    Unspecified hemorrhoids with other  complication    Past Surgical History:  Procedure Laterality Date   APPENDECTOMY  1979   CATARACT EXTRACTION     CHOLECYSTECTOMY  1989   COLONOSCOPY     IR KYPHO THORACIC WITH BONE BIOPSY  10/28/2022   IR KYPHO THORACIC WITH BONE BIOPSY  12/04/2022   IR RADIOLOGIST EVAL & MGMT  10/15/2022   IR RADIOLOGIST EVAL & MGMT  11/11/2022   IR RADIOLOGIST EVAL & MGMT  11/25/2022   POLYPECTOMY     VAGINAL HYSTERECTOMY  1996   Family History  Problem Relation Age of Onset   Stroke Mother    Dementia Mother    Stroke Father    Heart disease Sister        x 2   Colon cancer Sister    Stroke Sister 43   Heart disease Brother    Social History   Occupational History   Occupation: retired  Tobacco Use   Smoking status: Never   Smokeless tobacco: Never  Vaping Use   Vaping status: Never Used  Substance and Sexual Activity   Alcohol use: No    Alcohol/week: 0.0 standard drinks of alcohol   Drug use: No   Sexual activity: Not Currently   Tobacco Counseling Counseling given: No  SDOH Screenings   Food Insecurity: No Food Insecurity (  08/23/2024)  Housing: Unknown (08/23/2024)  Transportation Needs: No Transportation Needs (08/23/2024)  Utilities: Not At Risk (08/23/2024)  Alcohol Screen: Low Risk (08/20/2023)  Depression (PHQ2-9): Low Risk (08/23/2024)  Financial Resource Strain: Low Risk (08/20/2023)  Physical Activity: Inactive (08/23/2024)  Social Connections: Moderately Isolated (08/23/2024)  Stress: No Stress Concern Present (08/23/2024)  Tobacco Use: Low Risk (08/23/2024)  Health Literacy: Adequate Health Literacy (08/23/2024)   See flowsheets for full screening details  Depression Screen PHQ 2 & 9 Depression Scale- Over the past 2 weeks, how often have you been bothered by any of the following problems? Little interest or pleasure in doing things: 0 Feeling down, depressed, or hopeless (PHQ Adolescent also includes...irritable): 0 PHQ-2 Total Score: 0 Trouble falling or staying  asleep, or sleeping too much: 0 Feeling tired or having little energy: 0 Poor appetite or overeating (PHQ Adolescent also includes...weight loss): 0 Feeling bad about yourself - or that you are a failure or have let yourself or your family down: 0 Trouble concentrating on things, such as reading the newspaper or watching television (PHQ Adolescent also includes...like school work): 0 Moving or speaking so slowly that other people could have noticed. Or the opposite - being so fidgety or restless that you have been moving around a lot more than usual: 0 Thoughts that you would be better off dead, or of hurting yourself in some way: 0 PHQ-9 Total Score: 0 If you checked off any problems, how difficult have these problems made it for you to do your work, take care of things at home, or get along with other people?: Not difficult at all  Depression Treatment Depression Interventions/Treatment : [id number] Score <4 Follow-up Not Indicated     Goals Addressed               This Visit's Progress     Patient Stated (pt-stated)        Patient stated she plans to continue taking meds daily             Objective:    Today's Vitals   08/23/24 1310  Weight: 132 lb (59.9 kg)  Height: 4' 10 (1.473 m)   Body mass index is 27.59 kg/m.  Hearing/Vision screen Hearing Screening - Comments:: C/o - hearing concerns - plans to schedule an appt w/Dr Jesus Vision Screening - Comments:: Wears rx glasses - up to date with routine eye exams with [facility] Immunizations and Health Maintenance Health Maintenance  Topic Date Due   DTaP/Tdap/Td (2 - Tdap) 10/08/2023   COVID-19 Vaccine (6 - 2025-26 season) 03/28/2024   Bone Density Scan  04/03/2024   Mammogram  08/01/2025   Medicare Annual Wellness (AWV)  08/23/2025   Pneumococcal Vaccine: 50+ Years  Completed   Influenza Vaccine  Completed   Zoster Vaccines- Shingrix   Completed   Meningococcal B Vaccine  Aged Out   Colonoscopy   Discontinued        Assessment/Plan:  This is a routine wellness examination for [facility].  DEXA Scan status: ordered today  Patient Care Team: Geofm Glade PARAS, MD as PCP - General (Internal Medicine) Johnnye Ade, MD (Obstetrics and Gynecology) Mae Wanda, MD (Ophthalmology) Szabat, Toribio BROCKS, [facility] (Inactive) as Pharmacist (Pharmacist) Orpha Asberry RAMAN, MD as Consulting Physician (Orthopedic Surgery) Ivin Kocher, MD as Referring Physician (Dermatology) Jesus Oliphant, MD as Consulting Physician (Otolaryngology)  I have personally reviewed and noted the following in the patients chart:   Medical and social history Use of alcohol, tobacco or illicit drugs  Current medications and supplements including opioid prescriptions. Functional ability and status Nutritional status Physical activity Advanced directives List of other physicians Hospitalizations, surgeries, and ER visits in previous 12 months Vitals Screenings to include cognitive, depression, and falls Referrals and appointments  Orders Placed This Encounter  Procedures   DG Bone Density    Standing Status:   Future    Expiration Date:   08/23/2025    Reason for Exam (SYMPTOM  OR DIAGNOSIS REQUIRED):   osteoporosis    Preferred imaging location?:   [address]   In addition, I have reviewed and discussed with patient certain preventive protocols, quality metrics, and best practice recommendations. A written personalized care plan for preventive services as well as general preventive health recommendations were provided to patient.   Verdie CHRISTELLA Saba, CMA   08/23/2024   Return in 1 year (on 08/23/2025).  After Visit Summary: (Declined) Due to this being a telephonic visit, with patients personalized plan was offered to patient but patient Declined AVS at this time   Nurse Notes: Scheduled 2027 CPE w/PCP for 11/2024; scheduled 2027 AWV appt "

## 2024-12-12 ENCOUNTER — Encounter: Admitting: Internal Medicine

## 2025-08-24 ENCOUNTER — Ambulatory Visit
